# Patient Record
Sex: Female | Born: 1960 | Race: White | Hispanic: No | Marital: Married | Smoking: Former smoker
Health system: Southern US, Community
[De-identification: ages and names within clinical notes are randomized; demographics above are authoritative.]

## PROBLEM LIST (undated history)

## (undated) DIAGNOSIS — R1310 Dysphagia, unspecified: Secondary | ICD-10-CM

## (undated) DIAGNOSIS — K635 Polyp of colon: Secondary | ICD-10-CM

## (undated) DIAGNOSIS — D539 Nutritional anemia, unspecified: Secondary | ICD-10-CM

## (undated) DIAGNOSIS — B3781 Candidal esophagitis: Secondary | ICD-10-CM

## (undated) DIAGNOSIS — K644 Residual hemorrhoidal skin tags: Secondary | ICD-10-CM

## (undated) DIAGNOSIS — K7460 Unspecified cirrhosis of liver: Secondary | ICD-10-CM

## (undated) DIAGNOSIS — K760 Fatty (change of) liver, not elsewhere classified: Secondary | ICD-10-CM

## (undated) DIAGNOSIS — K7011 Alcoholic hepatitis with ascites: Secondary | ICD-10-CM

## (undated) DIAGNOSIS — I82409 Acute embolism and thrombosis of unspecified deep veins of unspecified lower extremity: Secondary | ICD-10-CM

## (undated) DIAGNOSIS — K209 Esophagitis, unspecified without bleeding: Secondary | ICD-10-CM

## (undated) DIAGNOSIS — K5790 Diverticulosis of intestine, part unspecified, without perforation or abscess without bleeding: Secondary | ICD-10-CM

## (undated) DIAGNOSIS — K3189 Other diseases of stomach and duodenum: Secondary | ICD-10-CM

## (undated) DIAGNOSIS — K766 Portal hypertension: Secondary | ICD-10-CM

## (undated) DIAGNOSIS — F1020 Alcohol dependence, uncomplicated: Secondary | ICD-10-CM

## (undated) DIAGNOSIS — F410 Panic disorder [episodic paroxysmal anxiety] without agoraphobia: Secondary | ICD-10-CM

## (undated) DIAGNOSIS — E46 Unspecified protein-calorie malnutrition: Secondary | ICD-10-CM

## (undated) DIAGNOSIS — I8500 Esophageal varices without bleeding: Secondary | ICD-10-CM

## (undated) DIAGNOSIS — K449 Diaphragmatic hernia without obstruction or gangrene: Secondary | ICD-10-CM

## (undated) HISTORY — DX: Portal hypertension: K76.6

## (undated) HISTORY — DX: Esophagitis, unspecified: K20.9

## (undated) HISTORY — DX: Fatty (change of) liver, not elsewhere classified: K76.0

## (undated) HISTORY — DX: Esophagitis, unspecified without bleeding: K20.90

## (undated) HISTORY — DX: Residual hemorrhoidal skin tags: K64.4

## (undated) HISTORY — DX: Polyp of colon: K63.5

## (undated) HISTORY — DX: Other diseases of stomach and duodenum: K31.89

## (undated) HISTORY — DX: Unspecified cirrhosis of liver: K74.60

## (undated) HISTORY — DX: Diaphragmatic hernia without obstruction or gangrene: K44.9

## (undated) HISTORY — DX: Acute embolism and thrombosis of unspecified deep veins of unspecified lower extremity: I82.409

## (undated) HISTORY — DX: Esophageal varices without bleeding: I85.00

## (undated) HISTORY — DX: Diverticulosis of intestine, part unspecified, without perforation or abscess without bleeding: K57.90

## (undated) HISTORY — DX: Candidal esophagitis: B37.81

---

## 2001-03-17 ENCOUNTER — Other Ambulatory Visit: Admission: RE | Admit: 2001-03-17 | Discharge: 2001-03-17 | Payer: Self-pay | Admitting: Obstetrics & Gynecology

## 2002-05-21 ENCOUNTER — Other Ambulatory Visit: Admission: RE | Admit: 2002-05-21 | Discharge: 2002-05-21 | Payer: Self-pay | Admitting: Obstetrics & Gynecology

## 2002-06-26 ENCOUNTER — Encounter: Payer: Self-pay | Admitting: Family Medicine

## 2002-06-26 ENCOUNTER — Ambulatory Visit (HOSPITAL_COMMUNITY): Admission: RE | Admit: 2002-06-26 | Discharge: 2002-06-26 | Payer: Self-pay | Admitting: Family Medicine

## 2003-08-13 ENCOUNTER — Other Ambulatory Visit: Admission: RE | Admit: 2003-08-13 | Discharge: 2003-08-13 | Payer: Self-pay | Admitting: Obstetrics & Gynecology

## 2004-09-29 ENCOUNTER — Other Ambulatory Visit: Admission: RE | Admit: 2004-09-29 | Discharge: 2004-09-29 | Payer: Self-pay | Admitting: Obstetrics & Gynecology

## 2009-08-23 DIAGNOSIS — F410 Panic disorder [episodic paroxysmal anxiety] without agoraphobia: Secondary | ICD-10-CM

## 2009-08-23 DIAGNOSIS — R1310 Dysphagia, unspecified: Secondary | ICD-10-CM

## 2009-08-23 HISTORY — DX: Panic disorder (episodic paroxysmal anxiety): F41.0

## 2009-08-23 HISTORY — DX: Dysphagia, unspecified: R13.10

## 2010-03-25 ENCOUNTER — Ambulatory Visit: Payer: Self-pay | Admitting: Family Medicine

## 2010-03-25 DIAGNOSIS — R1310 Dysphagia, unspecified: Secondary | ICD-10-CM | POA: Insufficient documentation

## 2010-03-25 DIAGNOSIS — F410 Panic disorder [episodic paroxysmal anxiety] without agoraphobia: Secondary | ICD-10-CM

## 2010-03-25 LAB — CONVERTED CEMR LAB
ALT: 14 units/L (ref 0–35)
AST: 25 units/L (ref 0–37)
Albumin: 4.5 g/dL (ref 3.5–5.2)
Alkaline Phosphatase: 32 units/L — ABNORMAL LOW (ref 39–117)
BUN: 11 mg/dL (ref 6–23)
Basophils Absolute: 0 10*3/uL (ref 0.0–0.1)
Basophils Relative: 0.4 % (ref 0.0–3.0)
Bilirubin, Direct: 0.1 mg/dL (ref 0.0–0.3)
CO2: 29 meq/L (ref 19–32)
Calcium: 9.9 mg/dL (ref 8.4–10.5)
Chloride: 100 meq/L (ref 96–112)
Cholesterol: 237 mg/dL — ABNORMAL HIGH (ref 0–200)
Creatinine, Ser: 0.7 mg/dL (ref 0.4–1.2)
Direct LDL: 146.3 mg/dL
Eosinophils Absolute: 0 10*3/uL (ref 0.0–0.7)
Eosinophils Relative: 0.2 % (ref 0.0–5.0)
Free T4: 0.64 ng/dL (ref 0.60–1.60)
GFR calc non Af Amer: 90.48 mL/min (ref >60)
Glucose, Bld: 93 mg/dL (ref 70–99)
HCT: 42 % (ref 36.0–46.0)
HDL: 69.7 mg/dL (ref >39.00)
Hemoglobin: 14.5 g/dL (ref 12.0–15.0)
Lymphocytes Relative: 19.4 % (ref 12.0–46.0)
Lymphs Abs: 1.5 10*3/uL (ref 0.7–4.0)
MCHC: 34.4 g/dL (ref 30.0–36.0)
MCV: 98.1 fL (ref 78.0–100.0)
Monocytes Absolute: 0.6 10*3/uL (ref 0.1–1.0)
Monocytes Relative: 7.1 % (ref 3.0–12.0)
Neutro Abs: 5.8 10*3/uL (ref 1.4–7.7)
Neutrophils Relative %: 72.9 % (ref 43.0–77.0)
Platelets: 316 10*3/uL (ref 150.0–400.0)
Potassium: 5.4 meq/L — ABNORMAL HIGH (ref 3.5–5.1)
RBC: 4.28 M/uL (ref 3.87–5.11)
RDW: 12.6 % (ref 11.5–14.6)
Sodium: 141 meq/L (ref 135–145)
T3, Free: 2.8 pg/mL (ref 2.3–4.2)
TSH: 0.47 microintl units/mL (ref 0.35–5.50)
Total Bilirubin: 0.6 mg/dL (ref 0.3–1.2)
Total CHOL/HDL Ratio: 3
Total Protein: 7.5 g/dL (ref 6.0–8.3)
Triglycerides: 99 mg/dL (ref 0.0–149.0)
VLDL: 19.8 mg/dL (ref 0.0–40.0)
WBC: 7.9 10*3/uL (ref 4.5–10.5)

## 2010-03-31 ENCOUNTER — Ambulatory Visit: Payer: Self-pay | Admitting: Family Medicine

## 2010-03-31 LAB — CONVERTED CEMR LAB
Bilirubin Urine: NEGATIVE
Glucose, Urine, Semiquant: NEGATIVE
Nitrite: NEGATIVE
Protein, U semiquant: NEGATIVE
Specific Gravity, Urine: 1.01
Urobilinogen, UA: 0.2
WBC Urine, dipstick: NEGATIVE
pH: 5

## 2010-04-13 ENCOUNTER — Ambulatory Visit: Payer: Self-pay | Admitting: Family Medicine

## 2010-04-13 DIAGNOSIS — N3000 Acute cystitis without hematuria: Secondary | ICD-10-CM | POA: Insufficient documentation

## 2010-04-13 LAB — CONVERTED CEMR LAB
Bilirubin Urine: NEGATIVE
Glucose, Urine, Semiquant: NEGATIVE
Ketones, urine, test strip: NEGATIVE
Nitrite: NEGATIVE
Protein, U semiquant: >=300
Specific Gravity, Urine: 1.025
Urobilinogen, UA: 0.2
WBC Urine, dipstick: NEGATIVE
pH: 6.5

## 2010-05-25 ENCOUNTER — Encounter (INDEPENDENT_AMBULATORY_CARE_PROVIDER_SITE_OTHER): Payer: Self-pay | Admitting: *Deleted

## 2010-09-13 ENCOUNTER — Encounter: Payer: Self-pay | Admitting: Obstetrics & Gynecology

## 2010-09-24 NOTE — Assessment & Plan Note (Signed)
Summary: NOT FEELING WELL / PALPITATIONS... OK PER DR Leslee Suire // RS   Vital Signs:  Patient profile:   50 year old female Menstrual status:  regular LMP:     03/08/2010 Height:      65.5 inches Weight:      134 pounds BMI:     22.04 Temp:     98.1 degrees F oral BP sitting:   140 / 80  (left arm) Cuff size:   regular  Vitals Entered By: Kern Reap CMA Duncan Dull) (March 25, 2010 11:31 AM) CC: new to establish, papitations Is Patient Diabetic? No Pain Assessment Patient in pain? no      LMP (date): 03/08/2010     Menstrual Status regular Enter LMP: 03/08/2010   CC:  new to establish and papitations.  History of Present Illness: Mercedes Mitchell is a 50 year old, married female, nonsmoker, who comes in today as a new patient for evaluation of two problems.  She said a history of panic attacks in the past.  It started about 15 years ago.  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  Next large trucks.  Therefore, when she goes to the beach.  She only takes the back roads to avoid any tracts.  Most recently, her anxiety spells have been unrelated to driving.  She has episodes where she gets a sweaty short of breath, rapid heart rate in these episodes can last up to 30 minutes and go away.  Review of systems otherwise negative.  Last Pap two years ago, Dr. Arlyce Dice.  Another issue is dysphasia.  She states her husband had to performed the Heimlich maneuver on her while back because she got something stuck in her throat.  Since that time.  She occasionally has difficulty swallowing.  No history of reflux.  Preventive Screening-Counseling & Management  Alcohol-Tobacco     Smoking Status: quit     Year Quit: 2009  Caffeine-Diet-Exercise     Does Patient Exercise: yes  Hep-HIV-STD-Contraception     Dental Visit-last 6 months no      Drug Use:  no.    Allergies (verified): No Known Drug Allergies  Past History:  Past medical, surgical, family and social histories (including risk  factors) reviewed, and no changes noted (except as noted below).  Past Surgical History: Tonsillectomy  Family History: Reviewed history and no changes required. Father: deceased - MI Mother: deceased Siblings: 1 brother deceased               3 sister/ 1 sister deceased - kidney dx  Social History: Reviewed history and no changes required. Occupation:[employer] Married Alcohol use-yes Drug use-no Regular exercise-yes Smoking Status:  quit Drug Use:  no Does Patient Exercise:  yes Dental Care w/in 6 mos.:  no  Review of Systems      See HPI  Physical Exam  General:  Well-developed,well-nourished,in no acute distress; alert,appropriate and cooperative throughout examination Head:  Normocephalic and atraumatic without obvious abnormalities. No apparent alopecia or balding. Neck:  No deformities, masses, or tenderness noted. Lungs:  Normal respiratory effort, chest expands symmetrically. Lungs are clear to auscultation, no crackles or wheezes. Heart:  Normal rate and regular rhythm. S1 and S2 normal without gallop, murmur, click, rub or other extra sounds.   Problems:  Medical Problems Added: 1)  Dx of Dysphagia  (ICD-787.20) 2)  Dx of Panic Disorder,no Agoraphobia  (ICD-300.01)  Impression & Recommendations:  Problem # 1:  DYSPHAGIA (ICD-787.20) Assessment New  Orders: Venipuncture (  33295) Specimen Handling (99000) EKG w/ Interpretation (93000) Gastroenterology Referral (GI) TLB-Lipid Panel (80061-LIPID) TLB-BMP (Basic Metabolic Panel-BMET) (80048-METABOL) TLB-CBC Platelet - w/Differential (85025-CBCD) TLB-Hepatic/Liver Function Pnl (80076-HEPATIC) TLB-TSH (Thyroid Stimulating Hormone) (84443-TSH) TLB-T4 (Thyrox), Free [phone]) TLB-T3, Free (Triiodothyronine) (84481-T3FREE)  Problem # 2:  PANIC DISORDER,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 1 tab @ bedtime    Ativan 0.5 Mg Tabs (Lorazepam) .Marland Kitchen... 1 by mouth as needed  Orders: Venipuncture (30160) Specimen Handling (10932) EKG w/ Interpretation (93000) TLB-Lipid Panel (80061-LIPID) TLB-BMP (Basic Metabolic Panel-BMET) (80048-METABOL) TLB-CBC Platelet - w/Differential (85025-CBCD) TLB-Hepatic/Liver Function Pnl (80076-HEPATIC) TLB-TSH (Thyroid Stimulating Hormone) (84443-TSH) TLB-T4 (Thyrox), Free [phone]) TLB-T3, Free (Triiodothyronine) (84481-T3FREE)  Complete Medication List: 1)  Celexa 20 Mg Tabs (Citalopram hydrobromide) .Marland Kitchen.. 1 tab @ bedtime 2)  Ativan 0.5 Mg Tabs (Lorazepam) .Marland Kitchen.. 1 by mouth as needed  Patient Instructions: 1)  begin Celexa 20 mg a day at bedtime.  Also, Ativan .5 if you have a breakthrough panic attack. 2)  Return in one week for 30 minute appointment for general physical exam. 3)  We will review all your laboratory week unless there is something unusual.  I will call you immediately 4)  I will request a consult from GI to evaluate the dysphagia Prescriptions: ATIVAN 0.5 MG TABS (LORAZEPAM) 1 by mouth as needed  #30 x 1   Entered and Authorized by:   Roderick Pee MD   Signed by:   Roderick Pee MD on 03/25/2010   Method used:   Print then Give to Patient   RxID:   [id number] CELEXA 20 MG TABS (CITALOPRAM HYDROBROMIDE) 1 tab @ bedtime  #100 x 3   Entered and Authorized by:   Roderick Pee MD   Signed by:   Roderick Pee MD on 03/25/2010   Method used:   Print then Give to Patient   RxID:   [id number]

## 2010-09-24 NOTE — Assessment & Plan Note (Signed)
Summary: 30 minute appt per dr Shawanna Zanders//ccm   Vital Signs:  Patient profile:   50 year old female Menstrual status:  regular Height:      65.5 inches Weight:      134 pounds Temp:     98.1 degrees F oral BP sitting:   140 / 88  (left arm) Cuff size:   regular  Vitals Entered By: Kern Reap CMA Duncan Dull) (March 31, 2010 4:33 PM) CC: cpx   CC:  cpx.  History of Present Illness: dao is a 50 year old, married female, G2, P2, with a 54 and 2 year old sons         who comes in today for general physical examination and to follow-up panic attacks.  She's always been in excellent, health.  She's had no chronic health problems besides the panic attacks.  We start on Celexa 20 mg nightly 5 days ago, and she states she only feels much better.  She's not had to use the Ativan.  She also wonders if her dysphagia might be related to anxiety.  However, I encouraged her to keep the GI consult appointment.  Tetanus booster 2006.  Annual Pap by Dr. Arlyce Dice.  GYN  Allergies: No Known Drug Allergies PMH-FH-SH reviewed-no changes except otherwise noted  Review of Systems      See HPI  Physical Exam  General:  Well-developed,well-nourished,in no acute distress; alert,appropriate and cooperative throughout examination Head:  Normocephalic and atraumatic without obvious abnormalities. No apparent alopecia or balding. Eyes:  No corneal or conjunctival inflammation noted. EOMI. Perrla. Funduscopic exam benign, without hemorrhages, exudates or papilledema. Vision grossly normal. Ears:  External ear exam shows no significant lesions or deformities.  Otoscopic examination reveals clear canals, tympanic membranes are intact bilaterally without bulging, retraction, inflammation or discharge. Hearing is grossly normal bilaterally. Nose:  External nasal examination shows no deformity or inflammation. Nasal mucosa are pink and moist without lesions or exudates. Mouth:  Oral mucosa and oropharynx without lesions  or exudates.  Teeth in good repair. Neck:  No deformities, masses, or tenderness noted. Chest Wall:  No deformities, masses, or tenderness noted. Breasts:  No mass, nodules, thickening, tenderness, bulging, retraction, inflamation, nipple discharge or skin changes noted.   Lungs:  Normal respiratory effort, chest expands symmetrically. Lungs are clear to auscultation, no crackles or wheezes. Heart:  Normal rate and regular rhythm. S1 and S2 normal without gallop, murmur, click, rub or other extra sounds. Abdomen:  Bowel sounds positive,abdomen soft and non-tender without masses, organomegaly or hernias noted. Msk:  No deformity or scoliosis noted of thoracic or lumbar spine.   Pulses:  R and L carotid,radial,femoral,dorsalis pedis and posterior tibial pulses are full and equal bilaterally Extremities:  No clubbing, cyanosis, edema, or deformity noted with normal full range of motion of all joints.   Neurologic:  No cranial nerve deficits noted. Station and gait are normal. Plantar reflexes are down-going bilaterally. DTRs are symmetrical throughout. Sensory, motor and coordinative functions appear intact. Skin:  total body skin exam normal.  She does have a light eyes, therefore encouraged to wear sunscreens and watch her moles, carefully, and return yearly for annual physical examination and skin check Cervical Nodes:  No lymphadenopathy noted Axillary Nodes:  No palpable lymphadenopathy Inguinal Nodes:  No significant adenopathy Psych:  Cognition and judgment appear intact. Alert and cooperative with normal attention span and concentration. 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 1 tab @ bedtime    Ativan  0.5 Mg Tabs (Lorazepam) .Marland Kitchen... 1 by mouth as needed  Problem # 3:  Preventive Health Care (ICD-V70.0) Assessment: New  Complete Medication List: 1)  Celexa 20 Mg Tabs (Citalopram hydrobromide) .Marland Kitchen.. 1 tab @ bedtime 2)  Ativan 0.5 Mg Tabs (Lorazepam) .Marland Kitchen.. 1 by mouth as needed  Patient Instructions: 1)  continue current medications.  Return in 3 weeks for follow-up, sooner if any problems. 2)  Wear SPF 30+ screens and watch her freckles and moles.  Carefully. 3)  Please schedule a follow-up appointment in 1 year. 4)  Schedule your mammogram.   Immunization History:  Tetanus/Td Immunization History:    Tetanus/Td:  historical (08/23/2004)   Laboratory Results   Urine Tests  Date/Time Received: March 31, 2010   Routine Urinalysis   Color: yellow Appearance: Clear Glucose: negative   (Normal Range: Negative) Bilirubin: negative   (Normal Range: Negative) Ketone: trace (5)   (Normal Range: Negative) Spec. Gravity: 1.010   (Normal Range: 1.003-1.035) Blood: moderate   (Normal Range: Negative) pH: 5.0   (Normal Range: 5.0-8.0) Protein: negative   (Normal Range: Negative) Urobilinogen: 0.2   (Normal Range: 0-1) Nitrite: negative   (Normal Range: Negative) Leukocyte Esterace: negative   (Normal Range: Negative)    Comments: Kern Reap CMA Duncan Dull)  March 31, 2010 5:31 PM

## 2010-09-24 NOTE — Assessment & Plan Note (Signed)
Summary: uti/cjr   Vital Signs:  Patient profile:   50 year old female Menstrual status:  regular Weight:      134 pounds Temp:     97.9 degrees F oral BP sitting:   130 / 90  (left arm) Cuff size:   regular  Vitals Entered By: Kern Reap CMA Duncan Dull) (April 13, 2010 12:34 PM) CC: uti   CC:  uti.  History of Present Illness: Mercedes Mitchell is a 50 year old female, who comes in with a week's history of frequency and dysuria and now one days history of hematuria.  Last week she noticed frequency and dysuria.  She drank lots of water, including cranberry juice, and his symptoms seem to get better until yesterday when they got worse and she noticed blood in her urine.  No back pain.  No fever, chills  Allergies: No Known Drug Allergies  Past History:  Past medical, surgical, family and social histories (including risk factors) reviewed for relevance to current acute and chronic problems.  Past Surgical History: Reviewed history from 03/25/2010 and no changes required. Tonsillectomy  Family History: Reviewed history from 03/25/2010 and no changes required. Father: deceased - MI Mother: deceased Siblings: 1 brother deceased               3 sister/ 1 sister deceased - kidney dx  Social History: Reviewed history from 03/25/2010 and no changes required. Occupation:[employer] Married Alcohol use-yes Drug use-no Regular exercise-yes  Physical Exam  General:  Well-developed,well-nourished,in no acute distress; alert,appropriate and cooperative throughout examination Abdomen:  Bowel sounds positive,abdomen soft and non-tender without masses, organomegaly or hernias noted.   Impression & Recommendations:  Problem # 1:  ACUTE CYSTITIS (ICD-595.0) Assessment New  Orders: UA Dipstick w/o Micro (manual) (24401) Prescription Created Electronically [phone])  Her updated medication list for this problem includes:    Septra Ds 800-160 Mg Tabs (Sulfamethoxazole-trimethoprim)  .Marland Kitchen... Take 1 tablet by mouth two times a day  Complete Medication List: 1)  Celexa 20 Mg Tabs (Citalopram hydrobromide) .Marland Kitchen.. 1 tab @ bedtime 2)  Ativan 0.5 Mg Tabs (Lorazepam) .Marland Kitchen.. 1 by mouth as needed 3)  Septra Ds 800-160 Mg Tabs (Sulfamethoxazole-trimethoprim) .... Take 1 tablet by mouth two times a day  Patient Instructions: 1)  drink 4 ounces of cranberry juice twice daily, and 30 ounces of water daily.  Begin Septra DS, one twice daily to bottle empty Prescriptions: SEPTRA DS 800-160 MG TABS (SULFAMETHOXAZOLE-TRIMETHOPRIM) Take 1 tablet by mouth two times a day  #20 x 1   Entered and Authorized by:   Roderick Pee MD   Signed by:   Roderick Pee MD on 04/13/2010   Method used:   Electronically to        [facility] [address] (retail)       [address]       [place], [place]  [address]       Ph: [phone] or [phone]       Fax: [phone]   RxID:   [id number]   Laboratory Results   Urine Tests  Date/Time Received: April 13, 2010   Routine Urinalysis   Color: brown Appearance: turbid Glucose: negative   (Normal Range: Negative) Bilirubin: negative   (Normal Range: Negative) Ketone: negative   (Normal Range: Negative) Spec. Gravity: 1.025   (Normal Range: 1.003-1.035) Blood: large   (Normal Range: Negative) pH: 6.5   (Normal Range: 5.0-8.0) Protein: >=300   (  Normal Range: Negative) Urobilinogen: 0.2   (Normal Range: 0-1) Nitrite: negative   (Normal Range: Negative) Leukocyte Esterace: negative   (Normal Range: Negative)    Comments: Kern Reap CMA Duncan Dull)  April 13, 2010 12:42 PM

## 2010-09-24 NOTE — Letter (Signed)
Summary: [facility] Referral (unable to schedule) Notification  [facility]  [address] [place], [place] [address]   Phone: [phone]  Fax: [phone]      May 25, 2010 Mercedes Mitchell 09-09-1960 MRN: [id number]   [name] [address] [place], [place]  [address]   Dear Dr.Todd:   Thank you for your kind referral of the above patient. We have attempted to schedule the recommended Colonoscopy but have been unable to schedule because:  _x_ The patient was not available by phone and/or has not returned our calls.  __ The patient declined to schedule the procedure at this time.  We appreciate the referral and hope that we will have the opportunity to treat this patient in the future.    Sincerely,   [facility]  Vania Rea. Jarold Motto M.D. Hedwig Morton. Juanda Chance M.D. Venita Lick. Russella Dar M.D. Wilhemina Bonito. Marina Goodell M.D. Barbette Hair. Arlyce Dice M.D. Iva Boop M.D. Cheron Every.D.

## 2016-12-21 DIAGNOSIS — K7011 Alcoholic hepatitis with ascites: Secondary | ICD-10-CM

## 2016-12-21 DIAGNOSIS — E46 Unspecified protein-calorie malnutrition: Secondary | ICD-10-CM

## 2016-12-21 DIAGNOSIS — D539 Nutritional anemia, unspecified: Secondary | ICD-10-CM | POA: Insufficient documentation

## 2016-12-21 DIAGNOSIS — F1020 Alcohol dependence, uncomplicated: Secondary | ICD-10-CM

## 2016-12-21 HISTORY — DX: Nutritional anemia, unspecified: D53.9

## 2016-12-21 HISTORY — DX: Alcohol dependence, uncomplicated: F10.20

## 2016-12-21 HISTORY — DX: Unspecified protein-calorie malnutrition: E46

## 2016-12-21 HISTORY — DX: Alcoholic hepatitis with ascites: K70.11

## 2016-12-23 ENCOUNTER — Ambulatory Visit (INDEPENDENT_AMBULATORY_CARE_PROVIDER_SITE_OTHER): Payer: Managed Care, Other (non HMO) | Admitting: Family Medicine

## 2016-12-23 ENCOUNTER — Encounter (HOSPITAL_COMMUNITY): Payer: Self-pay | Admitting: Emergency Medicine

## 2016-12-23 ENCOUNTER — Emergency Department (HOSPITAL_COMMUNITY): Payer: Managed Care, Other (non HMO)

## 2016-12-23 ENCOUNTER — Inpatient Hospital Stay (HOSPITAL_COMMUNITY)
Admission: EM | Admit: 2016-12-23 | Discharge: 2016-12-28 | DRG: 432 | Disposition: A | Discharge status: Home / Self Care | Payer: Managed Care, Other (non HMO) | Attending: Internal Medicine | Admitting: Internal Medicine

## 2016-12-23 ENCOUNTER — Encounter: Payer: Self-pay | Admitting: Family Medicine

## 2016-12-23 ENCOUNTER — Inpatient Hospital Stay: Admission: AD | Admit: 2016-12-23 | Payer: Self-pay | Source: Ambulatory Visit

## 2016-12-23 ENCOUNTER — Emergency Department (HOSPITAL_BASED_OUTPATIENT_CLINIC_OR_DEPARTMENT_OTHER)
Admit: 2016-12-23 | Discharge: 2016-12-23 | Disposition: A | Discharge status: Home / Self Care | Payer: Managed Care, Other (non HMO) | Attending: Emergency Medicine | Admitting: Emergency Medicine

## 2016-12-23 VITALS — BP 100/70 | HR 110 | Resp 12 | Ht 66.0 in | Wt 113.0 lb

## 2016-12-23 DIAGNOSIS — I959 Hypotension, unspecified: Secondary | ICD-10-CM | POA: Diagnosis present

## 2016-12-23 DIAGNOSIS — F410 Panic disorder [episodic paroxysmal anxiety] without agoraphobia: Secondary | ICD-10-CM | POA: Diagnosis present

## 2016-12-23 DIAGNOSIS — K7031 Alcoholic cirrhosis of liver with ascites: Secondary | ICD-10-CM | POA: Diagnosis not present

## 2016-12-23 DIAGNOSIS — Y92230 Patient room in hospital as the place of occurrence of the external cause: Secondary | ICD-10-CM | POA: Diagnosis not present

## 2016-12-23 DIAGNOSIS — K209 Esophagitis, unspecified without bleeding: Secondary | ICD-10-CM

## 2016-12-23 DIAGNOSIS — M79609 Pain in unspecified limb: Secondary | ICD-10-CM

## 2016-12-23 DIAGNOSIS — D539 Nutritional anemia, unspecified: Secondary | ICD-10-CM | POA: Diagnosis present

## 2016-12-23 DIAGNOSIS — R34 Anuria and oliguria: Secondary | ICD-10-CM | POA: Diagnosis present

## 2016-12-23 DIAGNOSIS — E871 Hypo-osmolality and hyponatremia: Secondary | ICD-10-CM | POA: Diagnosis present

## 2016-12-23 DIAGNOSIS — F1020 Alcohol dependence, uncomplicated: Secondary | ICD-10-CM | POA: Diagnosis present

## 2016-12-23 DIAGNOSIS — R159 Full incontinence of feces: Secondary | ICD-10-CM | POA: Diagnosis not present

## 2016-12-23 DIAGNOSIS — N179 Acute kidney failure, unspecified: Secondary | ICD-10-CM | POA: Diagnosis present

## 2016-12-23 DIAGNOSIS — M7989 Other specified soft tissue disorders: Secondary | ICD-10-CM

## 2016-12-23 DIAGNOSIS — T380X5A Adverse effect of glucocorticoids and synthetic analogues, initial encounter: Secondary | ICD-10-CM | POA: Diagnosis not present

## 2016-12-23 DIAGNOSIS — B179 Acute viral hepatitis, unspecified: Secondary | ICD-10-CM

## 2016-12-23 DIAGNOSIS — K760 Fatty (change of) liver, not elsewhere classified: Secondary | ICD-10-CM | POA: Diagnosis present

## 2016-12-23 DIAGNOSIS — K3189 Other diseases of stomach and duodenum: Secondary | ICD-10-CM | POA: Diagnosis present

## 2016-12-23 DIAGNOSIS — F1010 Alcohol abuse, uncomplicated: Secondary | ICD-10-CM | POA: Diagnosis not present

## 2016-12-23 DIAGNOSIS — F17210 Nicotine dependence, cigarettes, uncomplicated: Secondary | ICD-10-CM | POA: Diagnosis present

## 2016-12-23 DIAGNOSIS — I4581 Long QT syndrome: Secondary | ICD-10-CM | POA: Diagnosis present

## 2016-12-23 DIAGNOSIS — R188 Other ascites: Secondary | ICD-10-CM

## 2016-12-23 DIAGNOSIS — R195 Other fecal abnormalities: Secondary | ICD-10-CM | POA: Diagnosis present

## 2016-12-23 DIAGNOSIS — R64 Cachexia: Secondary | ICD-10-CM | POA: Diagnosis present

## 2016-12-23 DIAGNOSIS — F419 Anxiety disorder, unspecified: Secondary | ICD-10-CM | POA: Diagnosis present

## 2016-12-23 DIAGNOSIS — K7010 Alcoholic hepatitis without ascites: Secondary | ICD-10-CM | POA: Diagnosis present

## 2016-12-23 DIAGNOSIS — K8020 Calculus of gallbladder without cholecystitis without obstruction: Secondary | ICD-10-CM | POA: Diagnosis present

## 2016-12-23 DIAGNOSIS — E876 Hypokalemia: Secondary | ICD-10-CM | POA: Diagnosis present

## 2016-12-23 DIAGNOSIS — K7460 Unspecified cirrhosis of liver: Secondary | ICD-10-CM | POA: Diagnosis present

## 2016-12-23 DIAGNOSIS — Z681 Body mass index (BMI) 19 or less, adult: Secondary | ICD-10-CM

## 2016-12-23 DIAGNOSIS — K920 Hematemesis: Secondary | ICD-10-CM | POA: Diagnosis not present

## 2016-12-23 DIAGNOSIS — K449 Diaphragmatic hernia without obstruction or gangrene: Secondary | ICD-10-CM | POA: Diagnosis present

## 2016-12-23 DIAGNOSIS — R17 Unspecified jaundice: Secondary | ICD-10-CM | POA: Diagnosis not present

## 2016-12-23 DIAGNOSIS — D72829 Elevated white blood cell count, unspecified: Secondary | ICD-10-CM | POA: Diagnosis not present

## 2016-12-23 DIAGNOSIS — IMO0001 Reserved for inherently not codable concepts without codable children: Secondary | ICD-10-CM

## 2016-12-23 DIAGNOSIS — K2210 Ulcer of esophagus without bleeding: Secondary | ICD-10-CM | POA: Diagnosis present

## 2016-12-23 DIAGNOSIS — K7011 Alcoholic hepatitis with ascites: Secondary | ICD-10-CM | POA: Diagnosis present

## 2016-12-23 DIAGNOSIS — E43 Unspecified severe protein-calorie malnutrition: Secondary | ICD-10-CM | POA: Diagnosis not present

## 2016-12-23 DIAGNOSIS — E875 Hyperkalemia: Secondary | ICD-10-CM

## 2016-12-23 DIAGNOSIS — K766 Portal hypertension: Secondary | ICD-10-CM | POA: Diagnosis present

## 2016-12-23 DIAGNOSIS — B3781 Candidal esophagitis: Secondary | ICD-10-CM | POA: Diagnosis present

## 2016-12-23 DIAGNOSIS — K210 Gastro-esophageal reflux disease with esophagitis: Secondary | ICD-10-CM | POA: Diagnosis present

## 2016-12-23 DIAGNOSIS — D689 Coagulation defect, unspecified: Secondary | ICD-10-CM | POA: Diagnosis present

## 2016-12-23 DIAGNOSIS — Z8249 Family history of ischemic heart disease and other diseases of the circulatory system: Secondary | ICD-10-CM

## 2016-12-23 DIAGNOSIS — R000 Tachycardia, unspecified: Secondary | ICD-10-CM | POA: Diagnosis present

## 2016-12-23 HISTORY — DX: Alcohol dependence, uncomplicated: F10.20

## 2016-12-23 HISTORY — DX: Dysphagia, unspecified: R13.10

## 2016-12-23 HISTORY — DX: Nutritional anemia, unspecified: D53.9

## 2016-12-23 HISTORY — DX: Alcoholic hepatitis with ascites: K70.11

## 2016-12-23 HISTORY — DX: Unspecified protein-calorie malnutrition: E46

## 2016-12-23 HISTORY — DX: Panic disorder (episodic paroxysmal anxiety): F41.0

## 2016-12-23 LAB — COMPREHENSIVE METABOLIC PANEL
ALT: 46 U/L (ref 14–54)
ANION GAP: 16 — AB (ref 5–15)
AST: 137 U/L — ABNORMAL HIGH (ref 15–41)
Albumin: 2.3 g/dL — ABNORMAL LOW (ref 3.5–5.0)
Alkaline Phosphatase: 304 U/L — ABNORMAL HIGH (ref 38–126)
BUN: 8 mg/dL (ref 6–20)
CHLORIDE: 79 mmol/L — AB (ref 101–111)
CO2: 24 mmol/L (ref 22–32)
CREATININE: 1.3 mg/dL — AB (ref 0.44–1.00)
Calcium: 7.8 mg/dL — ABNORMAL LOW (ref 8.9–10.3)
GFR, EST AFRICAN AMERICAN: 53 mL/min — AB (ref >60)
GFR, EST NON AFRICAN AMERICAN: 46 mL/min — AB (ref >60)
Glucose, Bld: 126 mg/dL — ABNORMAL HIGH (ref 65–99)
Potassium: 2.9 mmol/L — ABNORMAL LOW (ref 3.5–5.1)
SODIUM: 119 mmol/L — AB (ref 135–145)
Total Bilirubin: 21.6 mg/dL (ref 0.3–1.2)
Total Protein: 6.1 g/dL — ABNORMAL LOW (ref 6.5–8.1)

## 2016-12-23 LAB — CBC
HCT: 31.4 % — ABNORMAL LOW (ref 36.0–46.0)
HEMOGLOBIN: 11.1 g/dL — AB (ref 12.0–15.0)
MCH: 38.3 pg — AB (ref 26.0–34.0)
MCHC: 35.4 g/dL (ref 30.0–36.0)
MCV: 108.3 fL — AB (ref 78.0–100.0)
PLATELETS: 327 10*3/uL (ref 150–400)
RBC: 2.9 MIL/uL — AB (ref 3.87–5.11)
RDW: 15.2 % (ref 11.5–15.5)
WBC: 26.8 10*3/uL — AB (ref 4.0–10.5)

## 2016-12-23 LAB — URINALYSIS, ROUTINE W REFLEX MICROSCOPIC
Glucose, UA: NEGATIVE mg/dL
Ketones, ur: NEGATIVE mg/dL
LEUKOCYTES UA: NEGATIVE
Nitrite: NEGATIVE
Protein, ur: NEGATIVE mg/dL
SPECIFIC GRAVITY, URINE: 1.009 (ref 1.005–1.030)
pH: 6 (ref 5.0–8.0)

## 2016-12-23 LAB — I-STAT CG4 LACTIC ACID, ED: LACTIC ACID, VENOUS: 2.01 mmol/L — AB (ref 0.5–1.9)

## 2016-12-23 LAB — PROTIME-INR
INR: 1.35
PROTHROMBIN TIME: 16.8 s — AB (ref 11.4–15.2)

## 2016-12-23 LAB — MAGNESIUM: MAGNESIUM: 1.5 mg/dL — AB (ref 1.7–2.4)

## 2016-12-23 LAB — ACETAMINOPHEN LEVEL

## 2016-12-23 LAB — BILIRUBIN, DIRECT: BILIRUBIN DIRECT: 14.4 mg/dL — AB (ref 0.1–0.5)

## 2016-12-23 LAB — LIPASE, BLOOD: LIPASE: 23 U/L (ref 11–51)

## 2016-12-23 MED ORDER — ENOXAPARIN SODIUM 40 MG/0.4ML ~~LOC~~ SOLN
40.0000 mg | Freq: Every day | SUBCUTANEOUS | Status: DC
  Administered 2016-12-23 – 2016-12-27 (×5): 40 mg via SUBCUTANEOUS
  Filled 2016-12-23 (×5): qty 0.4

## 2016-12-23 MED ORDER — PANTOPRAZOLE SODIUM 40 MG PO TBEC
40.0000 mg | DELAYED_RELEASE_TABLET | Freq: Every day | ORAL | Status: DC
  Administered 2016-12-24: 40 mg via ORAL
  Filled 2016-12-23: qty 1

## 2016-12-23 MED ORDER — ADULT MULTIVITAMIN W/MINERALS CH
1.0000 | ORAL_TABLET | Freq: Every day | ORAL | Status: DC
  Administered 2016-12-26 – 2016-12-28 (×3): 1 via ORAL
  Filled 2016-12-23 (×5): qty 1

## 2016-12-23 MED ORDER — VITAMIN B-1 100 MG PO TABS
100.0000 mg | ORAL_TABLET | Freq: Every day | ORAL | Status: DC
  Administered 2016-12-24 – 2016-12-28 (×3): 100 mg via ORAL
  Filled 2016-12-23 (×4): qty 1

## 2016-12-23 MED ORDER — ONDANSETRON HCL 4 MG/2ML IJ SOLN
4.0000 mg | Freq: Four times a day (QID) | INTRAMUSCULAR | Status: DC | PRN
  Administered 2016-12-24 – 2016-12-25 (×3): 4 mg via INTRAVENOUS
  Filled 2016-12-23 (×2): qty 2

## 2016-12-23 MED ORDER — SODIUM CHLORIDE 0.9 % IV BOLUS (SEPSIS)
1000.0000 mL | Freq: Once | INTRAVENOUS | Status: AC
  Administered 2016-12-23: 1000 mL via INTRAVENOUS

## 2016-12-23 MED ORDER — LORAZEPAM 2 MG/ML IJ SOLN: 1.0000 mg | Freq: Four times a day (QID) | INTRAMUSCULAR | Status: AC | PRN

## 2016-12-23 MED ORDER — THIAMINE HCL 100 MG/ML IJ SOLN
100.0000 mg | Freq: Every day | INTRAMUSCULAR | Status: DC
  Administered 2016-12-25 – 2016-12-27 (×2): 100 mg via INTRAVENOUS
  Filled 2016-12-23 (×3): qty 2

## 2016-12-23 MED ORDER — OXYCODONE HCL 5 MG PO TABS
5.0000 mg | ORAL_TABLET | Freq: Once | ORAL | Status: AC
  Administered 2016-12-23: 5 mg via ORAL
  Filled 2016-12-23: qty 1

## 2016-12-23 MED ORDER — SODIUM CHLORIDE 0.9% FLUSH
3.0000 mL | Freq: Two times a day (BID) | INTRAVENOUS | Status: DC
  Administered 2016-12-23 – 2016-12-27 (×6): 3 mL via INTRAVENOUS

## 2016-12-23 MED ORDER — LORAZEPAM 1 MG PO TABS: 1.0000 mg | ORAL_TABLET | Freq: Four times a day (QID) | ORAL | Status: AC | PRN

## 2016-12-23 MED ORDER — POTASSIUM CHLORIDE 10 MEQ/100ML IV SOLN
10.0000 meq | INTRAVENOUS | Status: AC
  Administered 2016-12-23 (×3): 10 meq via INTRAVENOUS
  Filled 2016-12-23 (×3): qty 100

## 2016-12-23 MED ORDER — POTASSIUM CHLORIDE CRYS ER 20 MEQ PO TBCR
40.0000 meq | EXTENDED_RELEASE_TABLET | Freq: Once | ORAL | Status: AC
Start: 2016-12-23 — End: 2016-12-23
  Administered 2016-12-23: 40 meq via ORAL
  Filled 2016-12-23: qty 2

## 2016-12-23 MED ORDER — IOPAMIDOL (ISOVUE-300) INJECTION 61%
INTRAVENOUS | Status: AC
  Filled 2016-12-23: qty 75

## 2016-12-23 MED ORDER — IOPAMIDOL (ISOVUE-300) INJECTION 61%
INTRAVENOUS | Status: AC
  Filled 2016-12-23: qty 100

## 2016-12-23 MED ORDER — FOLIC ACID 1 MG PO TABS
1.0000 mg | ORAL_TABLET | Freq: Every day | ORAL | Status: DC
  Administered 2016-12-24 – 2016-12-28 (×5): 1 mg via ORAL
  Filled 2016-12-23 (×5): qty 1

## 2016-12-23 MED ORDER — SODIUM CHLORIDE 0.9 % IV SOLN
INTRAVENOUS | Status: DC
  Administered 2016-12-23 – 2016-12-24 (×2): via INTRAVENOUS

## 2016-12-23 MED ORDER — BOOST / RESOURCE BREEZE PO LIQD
1.0000 | Freq: Three times a day (TID) | ORAL | Status: DC
  Administered 2016-12-24: 237 mL via ORAL
  Administered 2016-12-24 – 2016-12-28 (×6): 1 via ORAL

## 2016-12-23 MED ORDER — IOPAMIDOL (ISOVUE-300) INJECTION 61%
INTRAVENOUS | Status: AC
  Filled 2016-12-23: qty 30

## 2016-12-23 MED ORDER — MAGNESIUM SULFATE 2 GM/50ML IV SOLN
2.0000 g | Freq: Once | INTRAVENOUS | Status: AC
  Administered 2016-12-23: 2 g via INTRAVENOUS
  Filled 2016-12-23: qty 50

## 2016-12-23 MED ORDER — ONDANSETRON HCL 4 MG/2ML IJ SOLN
4.0000 mg | Freq: Once | INTRAMUSCULAR | Status: AC
  Administered 2016-12-23: 4 mg via INTRAVENOUS
  Filled 2016-12-23: qty 2

## 2016-12-23 MED ORDER — DEXTROSE 5 % IV SOLN
2.0000 g | Freq: Every day | INTRAVENOUS | Status: DC
Start: 2016-12-23 — End: 2016-12-26
  Administered 2016-12-23 – 2016-12-25 (×3): 2 g via INTRAVENOUS
  Filled 2016-12-23 (×3): qty 2

## 2016-12-23 MED ORDER — IOPAMIDOL (ISOVUE-300) INJECTION 61%
80.0000 mL | Freq: Once | INTRAVENOUS | Status: AC | PRN
  Administered 2016-12-23: 80 mL via INTRAVENOUS

## 2016-12-23 NOTE — ED Triage Notes (Signed)
Patient sent by doctor. Patient is complaining of abdominal pain and abdominal distension x2 weeks. Last BM was this morning. Patient has had decreased urinary output. Patient also states she has an alcohol problem. Patient last had an alcoholic drink last night.

## 2016-12-23 NOTE — H&P (Addendum)
History and Physical    Mercedes Mitchell IOM:[id number] DOB: 07/06/1961 DOA: 12/23/2016  PCP: Joycelyn Man, MD  Patient coming from: Home  I have personally briefly reviewed patient's old medical records in [place]  Chief Complaint: Abd pain  HPI: Mercedes Mitchell is a 56 y.o. female with medical history significant of ongoing EtOH abuse.  After discussion with patient and husband, I suspect patient is somewhat under-estimating her EtOH intake which seems to be in the realm of 2.5 gallons per week per husband.  Both agree that they are concerned that it might be too much.  Patient presents to the ED with c/o 1.5 months of Jaundice and now 2-3 weeks of Abd pain and distention with intermittent vomiting during this time.  12 lb weight loss over past 6 months.  No UOP today and only one void yesterday.  Last BM this AM.  Last drink was last night.  Saw PCP today who referred her to the ED.  ED Course: In the ED her bilirubin is 21.9, AST 137 alt 46, alk phos 304, WBC 26.8.  Sodium 119 Cl 79, Albumin 2.3.  INR 1.3.  CT abd/pelvis shows ascites and findings suggestive of acute hepatitis.   Review of Systems: As per HPI otherwise 10 point review of systems negative.   History reviewed. No pertinent past medical history.  History reviewed. No pertinent surgical history.   reports that she has been smoking Cigarettes.  She has never used smokeless tobacco. She reports that she drinks alcohol. She reports that she uses drugs.  Allergies  Allergen Reactions  . Pollen Extract     Family History  Problem Relation Age of Onset  . Hyperlipidemia Father   . Heart disease Father      Prior to Admission medications   Not on File    Physical Exam: Vitals:   12/23/16 1935 12/23/16 2022 12/23/16 2100 12/23/16 2121  BP: 1'00/70 95/66 93/66 ' 99/66  Pulse: (!) 101 (!) 103  100  Resp: '16 16 13 ' (!) 22  Temp:      TempSrc:      SpO2: 91% 93%  92%  Weight:      Height:          Constitutional: NAD, calm, comfortable, peripheral muscle wasting Eyes: PERRL, lids and conjunctivae normal ENMT: Mucous membranes are moist. Posterior pharynx clear of any exudate or lesions.Normal dentition.  Neck: normal, supple, no masses, no thyromegaly Respiratory: clear to auscultation bilaterally, no wheezing, no crackles. Normal respiratory effort. No accessory muscle use.  Cardiovascular: Regular rate and rhythm, no murmurs / rubs / gallops. No extremity edema. 2+ pedal pulses. No carotid bruits.  Abdomen: Distended with ascites Musculoskeletal: no clubbing / cyanosis. No joint deformity upper and lower extremities. Good ROM, no contractures. Normal muscle tone.  Skin: no rashes, lesions, ulcers. No induration Neurologic: CN 2-12 grossly intact. Sensation intact, DTR normal. Strength 5/5 in all 4.  Psychiatric: Normal judgment and insight. Alert and oriented x 3. Normal mood.    Labs on Admission: I have personally reviewed following labs and imaging studies  CBC:  Recent Labs Lab 12/23/16 1420  WBC 26.8*  HGB 11.1*  HCT 31.4*  MCV 108.3*  PLT 845   Basic Metabolic Panel:  Recent Labs Lab 12/23/16 1420  NA 119*  K 2.9*  CL 79*  CO2 24  GLUCOSE 126*  BUN 8  CREATININE 1.30*  CALCIUM 7.8*  MG 1.5*   GFR: Estimated Creatinine Clearance: 40.1  mL/min (A) (by C-G formula based on SCr of 1.3 mg/dL (H)). Liver Function Tests:  Recent Labs Lab 12/23/16 1420  AST 137*  ALT 46  ALKPHOS 304*  BILITOT 21.6*  PROT 6.1*  ALBUMIN 2.3*    Recent Labs Lab 12/23/16 1420  LIPASE 23   No results for input(s): AMMONIA in the last 168 hours. Coagulation Profile:  Recent Labs Lab 12/23/16 1420  INR 1.35   Cardiac Enzymes: No results for input(s): CKTOTAL, CKMB, CKMBINDEX, TROPONINI in the last 168 hours. BNP (last 3 results) No results for input(s): PROBNP in the last 8760 hours. HbA1C: No results for input(s): HGBA1C in the last 72 hours. CBG: No  results for input(s): GLUCAP in the last 168 hours. Lipid Profile: No results for input(s): CHOL, HDL, LDLCALC, TRIG, CHOLHDL, LDLDIRECT in the last 72 hours. Thyroid Function Tests: No results for input(s): TSH, T4TOTAL, FREET4, T3FREE, THYROIDAB in the last 72 hours. Anemia Panel: No results for input(s): VITAMINB12, FOLATE, FERRITIN, TIBC, IRON, RETICCTPCT in the last 72 hours. Urine analysis:    Component Value Date/Time   COLORURINE AMBER (A) 12/23/2016 1413   APPEARANCEUR HAZY (A) 12/23/2016 1413   LABSPEC 1.009 12/23/2016 1413   PHURINE 6.0 12/23/2016 1413   GLUCOSEU NEGATIVE 12/23/2016 1413   HGBUR SMALL (A) 12/23/2016 1413   HGBUR large 04/13/2010 1228   BILIRUBINUR MODERATE (A) 12/23/2016 1413   KETONESUR NEGATIVE 12/23/2016 1413   PROTEINUR NEGATIVE 12/23/2016 1413   UROBILINOGEN 0.2 04/13/2010 1228   NITRITE NEGATIVE 12/23/2016 1413   LEUKOCYTESUR NEGATIVE 12/23/2016 1413    Radiological Exams on Admission: US Abdomen Complete  Result Date: 12/23/2016 CLINICAL DATA:  56 y/o F; jaundice, hyperbilirubinemia, question ascites. EXAM: ABDOMEN ULTRASOUND COMPLETE COMPARISON:  None. FINDINGS: Gallbladder: Gallbladder sludge and small stones. Negative sonographic Murphy's sign. No gallbladder wall thickening or pericholecystic fluid. Common bile duct: Diameter: 5.5 mm Liver: Diffusely increased echogenicity. No focal lesion identified. IVC: Poorly visualized. Pancreas: Poorly visualized. Spleen: Size and appearance within normal limits. Right Kidney: Length: 11.3 cm. Echogenicity within normal limits. No mass or hydronephrosis visualized. Left Kidney: Length: 11.4 cm. Echogenicity within normal limits. No mass or hydronephrosis visualized. Abdominal aorta: No aneurysm visualized. Other findings: Small volume of peritoneal ascites, predominantly perihepatic. IMPRESSION: 1. Hepatic steatosis. 2. Gallbladder sludge and small stones. No secondary signs of acute cholecystitis. 3. Small  volume of peritoneal ascites, predominantly perihepatic. Electronically Signed   By: Kristine Garbe M.D.   On: 12/23/2016 19:22   Ct Abdomen Pelvis W Contrast  Result Date: 12/23/2016 CLINICAL DATA:  Jaundice for 1-1/2 months, ascites, RIGHT upper quadrant pain and weight loss. Intermittent chest pressure with bloating sensation for 3 weeks. Abnormal liver function tests, leukocytosis no intrahepatic biliary dilatation. Patent hepatopetal portal vein. EXAM: CT ABDOMEN AND PELVIS WITH CONTRAST TECHNIQUE: Multidetector CT imaging of the abdomen and pelvis was performed using the standard protocol following bolus administration of intravenous contrast. CONTRAST:  31m ISOVUE-300 IOPAMIDOL (ISOVUE-300) INJECTION 61% COMPARISON:  None. FINDINGS: LOWER CHEST: Small bilateral pleural effusions. Heart size is normal. No pericardial effusion. HEPATOBILIARY: The liver is at least 25 cm in cranial caudad dimension and diffusely, heterogeneously hypodense. Edema at the porta hepatis. Normal gallbladder. PANCREAS: Irregularity 13 x 19 mm hypodensity within or above the pancreatic head without ductal dilatation, pseudocyst or calcifications. SPLEEN: Normal. ADRENALS/URINARY TRACT: Kidneys are orthotopic, demonstrating symmetric enhancement. No nephrolithiasis, hydronephrosis or solid renal masses. The unopacified ureters are normal in course and caliber. Delayed imaging through the kidneys demonstrates  symmetric prompt contrast excretion within the proximal urinary collecting system. Urinary bladder is partially distended and unremarkable. Normal adrenal glands. STOMACH/BOWEL: Small hiatal hernia. The stomach, small and large bowel are normal in course and caliber without inflammatory changes. Mild colonic diverticulosis. VASCULAR/LYMPHATIC: Aortoiliac vessels are normal in course and caliber, trace calcific atherosclerosis. No lymphadenopathy by CT size criteria. REPRODUCTIVE: Normal. OTHER: Large volume ascites,  without flattening in the inferior vena cava. No intraperitoneal free air. Mild mesenteric edema without omental or mesenteric mass. MUSCULOSKELETAL: Mild anasarca. IMPRESSION: Severe hypodense hepatomegaly concerning for acute hepatitis. Large volume ascites. No biliary obstruction. Focal hypodensity within or above the pancreatic head, difficult to further characterize due to extensive edema at the porta hepatis. Recommend close attention on follow-up imaging. Small pleural effusions. Electronically Signed   By: Elon Alas M.D.   On: 12/23/2016 19:25    EKG: Independently reviewed.  Assessment/Plan Principal Problem:   Acute alcoholic hepatitis Active Problems:   ETOH abuse    1. Acute alcoholic hepatitis -  1. Acute hepatitis, strongly suspect EtOH as underlying cause given the further history taking.  It is fairly obvious and apparent that EtOH use is much heavier than initially estimated. 2. Repeat CMP in AM 3. Tylenol level pending, acute hepatitis pnl pending 4. GI coming to consult in AM, trying to get a-hold of them again to see if they want me to start steroids given that her Maddrey's score is 37. 5. Per GI: dont start steroids tonight given large vol ascites, WBC 26.8k.  Put on gm of rocephin, dont have IR tap abd yet.  Start on PPI. 6. IVF given low UOP 2. EtOH abuse - CIWA 3. Hypok - replace 4. Hypo mag - replace 5. Hyponatremia - IVF with NS, repeat CMP in AM  DVT prophylaxis: Lovenox Code Status: Full Family Communication: Husband at bedside Disposition Plan: TBD, probably needs inpatient EtOH rehab Consults called: GI - spoke with Dr. Loletha Carrow, recs as above, Dr. Hilarie Fredrickson will see in AM. Admission status: Admit to inpatient   Etta Quill DO [facility] Pager [phone]  If 7AM-7PM, please contact day team taking care of patient [email or web address] Password [email or web address]  12/23/2016, 10:06 PM

## 2016-12-23 NOTE — Progress Notes (Signed)
PHARMACY NOTE:  ANTIMICROBIAL RENAL DOSAGE ADJUSTMENT  Current antimicrobial regimen includes a mismatch between antimicrobial dosage and estimated renal function.  As per policy approved by the Pharmacy & Therapeutics and Medical Executive Committees, the antimicrobial dosage will be adjusted accordingly.  Current antimicrobial dosage:  Rocephin 1 Gm IV q24h  Indication: IAI  Renal Function:  Estimated Creatinine Clearance: 40.1 mL/min (A) (by C-G formula based on SCr of 1.3 mg/dL (H)). []      On intermittent HD, scheduled: []      On CRRT    Antimicrobial dosage has been changed to:  Rocephin 2 Gm IV q24h     Thank you for allowing pharmacy to be a part of this patient's care.  Dorrene German, [facility] 12/23/2016 10:36 PM

## 2016-12-23 NOTE — ED Provider Notes (Signed)
[facility] DEPT Provider Note   CSN: [id number] Arrival date & time: 12/23/16  1348     History   Chief Complaint Chief Complaint  Patient presents with  . Abdominal Pain    HPI Mercedes Mitchell is a 56 y.o. female.  56 year old female who presents with abdominal pain and abdominal distention. The patient went to her PCP today for 2-3 weeks of abdominal pain and distention. She has also had intermittent vomiting during this time. Husband reports 1.5 months of jaundice which first started in her eyes. She has had some diarrhea recently, no blood in her stool. She reports approximately 12 pound weight loss over the past 6 months. She has also had decreased urine output with only one void yesterday and no urine output today. Her last bowel movement was this morning. She reports drinking approximately 4 alcoholic drinks a day, last drink was last night. She denies any withdrawal symptoms or history of withdrawal seizures. Her PCP sent her here for further evaluation.   The history is provided by the patient and the spouse.  Abdominal Pain      History reviewed. No pertinent past medical history.  Patient Active Problem List   Diagnosis Date Noted  . 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 DYSPHAGIA 03/25/2010    History reviewed. No pertinent surgical history.  OB History    No data available       Home Medications    Prior to Admission medications   Not on File    Family History Family History  Problem Relation Age of Onset  . Hyperlipidemia Father   . Heart disease Father     Social History Social History  Substance Use Topics  . Smoking status: Current Every Day Smoker    Types: Cigarettes  . Smokeless tobacco: Never Used  . Alcohol use Yes     Allergies   Pollen extract   Review of Systems Review of Systems  Gastrointestinal: Positive for abdominal pain.   All other systems reviewed and are negative except that which was mentioned in  HPI  Physical Exam Updated Vital Signs BP 99/66 (BP Location: Right Arm)   Pulse 100   Temp 97.8 F (36.6 C) (Oral)   Resp (!) 22   Ht 5\' 6"  (1.676 m)   Wt 113 lb (51.3 kg)   SpO2 92%   BMI 18.24 kg/m   Physical Exam  Constitutional: She is oriented to person, place, and time. No distress.  Thin, peripheral wasting, jaundiced  HENT:  Head: Normocephalic and atraumatic.  Dry mucous membranes  Eyes: Conjunctivae are normal. Pupils are equal, round, and reactive to light. Scleral icterus is present.  Neck: Neck supple.  Cardiovascular: Regular rhythm and normal heart sounds.  Tachycardia present.   No murmur heard. Pulmonary/Chest: Effort normal and breath sounds normal.  Abdominal: Soft. Bowel sounds are normal. She exhibits distension. There is tenderness. There is no rebound and no guarding.  Tenderness of RUQ, midepigastrium with moderate ascites  Musculoskeletal: She exhibits edema.  Pitting edema L ankle and lower leg, no calf tenderness  Neurological: She is alert and oriented to person, place, and time.  No asterixis, Fluent speech  Skin: Skin is warm and dry.  Spider angioma on chest, jaundice  Psychiatric: She has a normal mood and affect. Judgment normal.  Nursing note and vitals reviewed.    ED Treatments / Results  Labs (all labs ordered are listed, but only abnormal results are displayed) Labs Reviewed  COMPREHENSIVE METABOLIC  PANEL - Abnormal; Notable for the following:       Result Value   Sodium 119 (*)    Potassium 2.9 (*)    Chloride 79 (*)    Glucose, Bld 126 (*)    Creatinine, Ser 1.30 (*)    Calcium 7.8 (*)    Total Protein 6.1 (*)    Albumin 2.3 (*)    AST 137 (*)    Alkaline Phosphatase 304 (*)    Total Bilirubin 21.6 (*)    GFR calc non Af Amer 46 (*)    GFR calc Af Amer 53 (*)    Anion gap 16 (*)    All other components within normal limits  CBC - Abnormal; Notable for the following:    WBC 26.8 (*)    RBC 2.90 (*)    Hemoglobin  11.1 (*)    HCT 31.4 (*)    MCV 108.3 (*)    MCH 38.3 (*)    All other components within normal limits  URINALYSIS, ROUTINE W REFLEX MICROSCOPIC - Abnormal; Notable for the following:    Color, Urine AMBER (*)    APPearance HAZY (*)    Hgb urine dipstick SMALL (*)    Bilirubin Urine MODERATE (*)    Bacteria, UA RARE (*)    Squamous Epithelial / LPF 0-5 (*)    All other components within normal limits  BILIRUBIN, DIRECT - Abnormal; Notable for the following:    Bilirubin, Direct 14.4 (*)    All other components within normal limits  MAGNESIUM - Abnormal; Notable for the following:    Magnesium 1.5 (*)    All other components within normal limits  PROTIME-INR - Abnormal; Notable for the following:    Prothrombin Time 16.8 (*)    All other components within normal limits  I-STAT CG4 LACTIC ACID, ED - Abnormal; Notable for the following:    Lactic Acid, Venous 2.01 (*)    All other components within normal limits  CULTURE, BLOOD (ROUTINE X 2)  CULTURE, BLOOD (ROUTINE X 2)  URINE CULTURE  LIPASE, BLOOD  HEPATITIS PANEL, ACUTE  ACETAMINOPHEN LEVEL    EKG  EKG Interpretation  Date/Time:  Thursday Dec 23 2016 16:02:53 EDT Ventricular Rate:  100 PR Interval:    QRS Duration: 96 QT Interval:  383 QTC Calculation: 494 R Axis:   61 Text Interpretation:  Sinus tachycardia Low voltage, precordial leads Borderline prolonged QT interval No previous ECGs available Confirmed by Cashtyn Pouliot MD, Ketih Goodie [phone]) on 12/23/2016 4:29:46 PM       Radiology US Abdomen Complete  Result Date: 12/23/2016 CLINICAL DATA:  56 y/o F; jaundice, hyperbilirubinemia, question ascites. EXAM: ABDOMEN ULTRASOUND COMPLETE COMPARISON:  None. FINDINGS: Gallbladder: Gallbladder sludge and small stones. Negative sonographic Murphy's sign. No gallbladder wall thickening or pericholecystic fluid. Common bile duct: Diameter: 5.5 mm Liver: Diffusely increased echogenicity. No focal lesion identified. IVC: Poorly  visualized. Pancreas: Poorly visualized. Spleen: Size and appearance within normal limits. Right Kidney: Length: 11.3 cm. Echogenicity within normal limits. No mass or hydronephrosis visualized. Left Kidney: Length: 11.4 cm. Echogenicity within normal limits. No mass or hydronephrosis visualized. Abdominal aorta: No aneurysm visualized. Other findings: Small volume of peritoneal ascites, predominantly perihepatic. IMPRESSION: 1. Hepatic steatosis. 2. Gallbladder sludge and small stones. No secondary signs of acute cholecystitis. 3. Small volume of peritoneal ascites, predominantly perihepatic. Electronically Signed   By: Kristine Garbe M.D.   On: 12/23/2016 19:22   Ct Abdomen Pelvis W Contrast  Result Date: 12/23/2016  CLINICAL DATA:  Jaundice for 1-1/2 months, ascites, RIGHT upper quadrant pain and weight loss. Intermittent chest pressure with bloating sensation for 3 weeks. Abnormal liver function tests, leukocytosis no intrahepatic biliary dilatation. Patent hepatopetal portal vein. EXAM: CT ABDOMEN AND PELVIS WITH CONTRAST TECHNIQUE: Multidetector CT imaging of the abdomen and pelvis was performed using the standard protocol following bolus administration of intravenous contrast. CONTRAST:  61mL ISOVUE-300 IOPAMIDOL (ISOVUE-300) INJECTION 61% COMPARISON:  None. FINDINGS: LOWER CHEST: Small bilateral pleural effusions. Heart size is normal. No pericardial effusion. HEPATOBILIARY: The liver is at least 25 cm in cranial caudad dimension and diffusely, heterogeneously hypodense. Edema at the porta hepatis. Normal gallbladder. PANCREAS: Irregularity 13 x 19 mm hypodensity within or above the pancreatic head without ductal dilatation, pseudocyst or calcifications. SPLEEN: Normal. ADRENALS/URINARY TRACT: Kidneys are orthotopic, demonstrating symmetric enhancement. No nephrolithiasis, hydronephrosis or solid renal masses. The unopacified ureters are normal in course and caliber. Delayed imaging through the  kidneys demonstrates symmetric prompt contrast excretion within the proximal urinary collecting system. Urinary bladder is partially distended and unremarkable. Normal adrenal glands. STOMACH/BOWEL: Small hiatal hernia. The stomach, small and large bowel are normal in course and caliber without inflammatory changes. Mild colonic diverticulosis. VASCULAR/LYMPHATIC: Aortoiliac vessels are normal in course and caliber, trace calcific atherosclerosis. No lymphadenopathy by CT size criteria. REPRODUCTIVE: Normal. OTHER: Large volume ascites, without flattening in the inferior vena cava. No intraperitoneal free air. Mild mesenteric edema without omental or mesenteric mass. MUSCULOSKELETAL: Mild anasarca. IMPRESSION: Severe hypodense hepatomegaly concerning for acute hepatitis. Large volume ascites. No biliary obstruction. Focal hypodensity within or above the pancreatic head, difficult to further characterize due to extensive edema at the porta hepatis. Recommend close attention on follow-up imaging. Small pleural effusions. Electronically Signed   By: Elon Alas M.D.   On: 12/23/2016 19:25    Procedures .Critical Care Performed by: Sharlett Iles Authorized by: Sharlett Iles   Critical care provider statement:    Critical care time (minutes):  45   Critical care time was exclusive of:  Separately billable procedures and treating other patients   Critical care was necessary to treat or prevent imminent or life-threatening deterioration of the following conditions:  Metabolic crisis and hepatic failure   Critical care was time spent personally by me on the following activities:  Development of treatment plan with patient or surrogate, discussions with consultants, evaluation of patient's response to treatment, obtaining history from patient or surrogate, examination of patient, ordering and performing treatments and interventions, ordering and review of laboratory studies, ordering and  review of radiographic studies and re-evaluation of patient's condition   (including critical care time)  Medications Ordered in ED Medications  iopamidol (ISOVUE-300) 61 % injection (not administered)  iopamidol (ISOVUE-300) 61 % injection (not administered)  iopamidol (ISOVUE-300) 61 % injection (not administered)  potassium chloride SA (K-DUR,KLOR-CON) CR tablet 40 mEq (not administered)  0.9 %  sodium chloride infusion (not administered)  sodium chloride 0.9 % bolus 1,000 mL (0 mLs Intravenous Stopped 12/23/16 1814)  potassium chloride 10 mEq in 100 mL IVPB (0 mEq Intravenous Stopped 12/23/16 1937)  magnesium sulfate IVPB 2 g 50 mL (0 g Intravenous Stopped 12/23/16 2122)  oxyCODONE (Oxy IR/ROXICODONE) immediate release tablet 5 mg (5 mg Oral Given 12/23/16 1735)  iopamidol (ISOVUE-300) 61 % injection 80 mL (80 mLs Intravenous Contrast Given 12/23/16 1852)     Initial Impression / Assessment and Plan / ED Course  I have reviewed the triage vital signs and the nursing notes.  Pertinent labs & imaging results that were available during my care of the patient were reviewed by me and considered in my medical decision making (see chart for details).     PT w/ several weeks of worsening abdominal pain and distention as well as over a month of jaundice. She was chronically ill-appearing but in no acute distress on exam. Afebrile, heart rate 108, BP 98/72. She had right upper quadrant and midepigastric tenderness with ascites and abdominal distention, jaundice on exam.   Labs with significant abnormalities including sodium 119, potassium 2.9, creatinine 1.3, low albumin and protein, AST 137, alkaline phosphatase 304, total bilirubin 21.6, anion gap 16. WBC 26.8, hemoglobin 11.1. INR is mildly elevated at 1.35. Gave the patient IV potassium, fluid bolus as she appears clinically dehydrated. Magnesium also low therefore gave IV magnesium.  Abdominal ultrasound shows hepatic steatosis, ascites,  gallbladder sludge and small stones but no cholecystitis.  CT shows severe hepatomegaly c/w acute hepatitis, ascites, possible abnormality at pancreatic head. Discussed the patient's lab work and imaging with gastroenterology, and per recommendations added Tylenol and hepatitis panel. Discussed admission with hospitalist, Dr. Alcario Drought, and patient admitted for further care. Final Clinical Impressions(s) / ED Diagnoses   Final diagnoses:  Acute hepatitis  Alcoholism ([place])  Hyponatremia  Hyperkalemia    New Prescriptions New Prescriptions   No medications on file     Sharlett Iles, MD 12/24/16 [phone]

## 2016-12-23 NOTE — Progress Notes (Signed)
*  PRELIMINARY RESULTS* Vascular Ultrasound left lower extremity venous duplex has been completed.  Preliminary findings: No evidence of deep vein thrombosis or Mayfield's cysts in the left lower extremity.  Preliminary results given to nurse at 18:07   Mercedes Mitchell 12/23/2016, 6:06 PM

## 2016-12-23 NOTE — Progress Notes (Signed)
Pre visit review using our clinic review tool, if applicable. No additional management support is needed unless otherwise documented below in the visit note. 

## 2016-12-23 NOTE — Progress Notes (Signed)
HPI:   [name] is a 56 y.o. female, who is here today with her husband to establish care.  Former PCP: Dr Sherren Mocha Last preventive routine visit: 2014 she had her last gyn preventive visit.  Chronic medical problems: Per records dysphagia and anxiety disorder. Currently she is not on treatment.   Concerns today: "Bloating" sensation.  3 weeks of constant bloating sensation and intermittent pressure pain on both rib cages, the latter one seems to be worse when lying down. 2 weeks of diarrhea, prior Hx of constipation, 3 stools per day, loose, with episodes of fecal incontinence. Colonoscopy not found.  A months of nausea and vomiting, decreased appetite, she has about 1-3 vomiting and not daily. It is exacerbated by oral intake, and some "smells" , no alleviating factors identified. + Wt loss, decreased appetite for over a month.  History of dysphagia, according to patient she still has episodes "sometimes", chills denies hematemesis. She denies heartburn. For the past few days she has noted easy bruising, mainly on arms. She denies gross hematuria, blood in stool, gum/nose bleeding.  Decreased urine output for the past few days, urinating once daily, has not had urine output in the past 24 hours. She is drinking water, which she has tolerated well, she has not had vomiting today.  Current inquiry about alcohol intake, she states that she drinks "several drinks" per day. + Smoker. Noted jaundice, she has not noted it,husband states that started maybe 1.5 months ago.   She denies fever, chills, skin rash, erythema, recent travel, sick contacts, recent antibiotic use.   Review of Systems  Constitutional: Positive for activity change, appetite change, fatigue and unexpected weight change. Negative for fever.  HENT: Positive for trouble swallowing. Negative for mouth sores, nosebleeds, sore throat and voice change.   Eyes: Negative for pain and visual disturbance.    Respiratory: Negative for cough, shortness of breath and wheezing.   Cardiovascular: Positive for chest pain (Occasionally left-sided tightness,at rest and for "a second"). Negative for palpitations and leg swelling.  Gastrointestinal: Positive for abdominal distention, abdominal pain (bloating sensation), diarrhea, nausea and vomiting. Negative for blood in stool.  Endocrine: Positive for polydipsia. Negative for cold intolerance, heat intolerance, polyphagia and polyuria.  Genitourinary: Positive for decreased urine volume. Negative for dysuria, hematuria, vaginal bleeding and vaginal discharge.  Musculoskeletal: Positive for gait problem. Negative for back pain and myalgias.  Skin: Positive for pallor. Negative for rash.  Neurological: Positive for tremors and headaches (no more than usual). Negative for seizures and speech difficulty.  Hematological: Negative for adenopathy. Bruises/bleeds easily.  Psychiatric/Behavioral: Negative for confusion. The patient is nervous/anxious.     No current outpatient prescriptions on file prior to visit.   No current facility-administered medications on file prior to visit.     No past medical history on file. Allergies  Allergen Reactions  . Pollen Extract     Family History  Problem Relation Age of Onset  . Hyperlipidemia Father   . Heart disease Father     Social History   Social History  . Marital status: Married    Spouse name: N/A  . Number of children: N/A  . Years of education: N/A   Social History Main Topics  . Smoking status: Current Every Day Smoker    Types: Cigarettes  . Smokeless tobacco: Never Used  . Alcohol use Yes  . Drug use: Yes  . Sexual activity: Not Asked   Other Topics Concern  .  None   Social History Narrative  . None    Vitals:   12/23/16 1153  BP: 100/70  Pulse: (!) 110  Resp: 12   O2 sat at RA 96% Body mass index is 18.24 kg/m.   Physical Exam  Nursing note and vitals  reviewed. Constitutional: She is oriented to person, place, and time. She appears well-developed. No distress.  cachectic   HENT:  Head: Atraumatic.  Mouth/Throat: Oropharynx is clear and moist and mucous membranes are normal.  Eyes: EOM are normal. Pupils are equal, round, and reactive to light. Scleral icterus is present.  Neck: No tracheal deviation present. No thyroid mass and no thyromegaly present.  Cardiovascular: Regular rhythm.  Tachycardia present.   No murmur heard. Pulses:      Dorsalis pedis pulses are 2+ on the right side, and 2+ on the left side.  Respiratory: Effort normal and breath sounds normal. No respiratory distress.  GI: Soft. Bowel sounds are normal. She exhibits fluid wave and ascites. There is hepatomegaly. There is no tenderness.  Musculoskeletal: She exhibits no edema.  Lymphadenopathy:    She has no cervical adenopathy.       Right: No supraclavicular adenopathy present.       Left: No supraclavicular adenopathy present.  Neurological: She is alert and oriented to person, place, and time. She displays tremor (Mild,hands and head). No cranial nerve deficit. Gait abnormal.  Unstable wide base gait, assisted by holding husband, tilted towards right.  Skin: Skin is warm. No rash noted. No erythema. There is pallor.  Icteric   Psychiatric: She has a normal mood and affect.  Well groomed, good eye contact.     ASSESSMENT AND PLAN:   Mercedes Mitchell was seen today for establish care.  Diagnoses and all orders for this visit:  Oliguria and anuria  Most of studies could have been arranged as outpatient but because reported decreased urine output, she was referred to ER today. She is going to need extensive work up and IVF. Case was discussed with hospitalist , Dr Marily Memos, who recommended referring pt to the ER instead direct admission given the fact she is hemodynamically stable and in no acute distress.  Other ascites  We discussed possible etiologies, cirrhosis  most likely given her history of alcohol abuse but also malignancies need to be considered. Abdominal imaging is going to be necessary, she also may need paracentesis to relieve discomfort.  Jaundice of recent onset  ? Alcoholic cirrhosis with ascitics. ?Pancreatic malignancy. Overall she does not seem to be in acute distress, given the fact she has not noted and husband was not very concerned about this make me believe this might has been going on longer than 1.5 months.   Alcoholism /alcohol abuse Mercedes Mitchell)  Husband states that his major concerned today is treatment of alcohol problems, he is requesting admission to a "detox program."  I explained that certainly this is important but this could be arranged later on and after ruling out serious illness. He could ask ER provider if this can be arranged through the ER. I did strongly recommend start decreasing alcohol intake.     Mercedes G. Martinique, MD  [facility]. [place] office.

## 2016-12-23 NOTE — Patient Instructions (Signed)
Sent to ER

## 2016-12-23 NOTE — ED Notes (Signed)
US at bedside

## 2016-12-24 ENCOUNTER — Encounter (HOSPITAL_COMMUNITY): Payer: Self-pay | Admitting: Physician Assistant

## 2016-12-24 ENCOUNTER — Inpatient Hospital Stay (HOSPITAL_COMMUNITY): Payer: Managed Care, Other (non HMO)

## 2016-12-24 DIAGNOSIS — R17 Unspecified jaundice: Secondary | ICD-10-CM

## 2016-12-24 DIAGNOSIS — K7011 Alcoholic hepatitis with ascites: Principal | ICD-10-CM

## 2016-12-24 DIAGNOSIS — D72829 Elevated white blood cell count, unspecified: Secondary | ICD-10-CM

## 2016-12-24 DIAGNOSIS — F1010 Alcohol abuse, uncomplicated: Secondary | ICD-10-CM

## 2016-12-24 DIAGNOSIS — E43 Unspecified severe protein-calorie malnutrition: Secondary | ICD-10-CM

## 2016-12-24 DIAGNOSIS — K920 Hematemesis: Secondary | ICD-10-CM

## 2016-12-24 DIAGNOSIS — D689 Coagulation defect, unspecified: Secondary | ICD-10-CM

## 2016-12-24 DIAGNOSIS — K7010 Alcoholic hepatitis without ascites: Secondary | ICD-10-CM

## 2016-12-24 DIAGNOSIS — B179 Acute viral hepatitis, unspecified: Secondary | ICD-10-CM

## 2016-12-24 LAB — ETHANOL: Alcohol, Ethyl (B): <5 mg/dL (ref <5)

## 2016-12-24 LAB — RAPID URINE DRUG SCREEN, HOSP PERFORMED
AMPHETAMINES: NOT DETECTED
BARBITURATES: NOT DETECTED
Benzodiazepines: NOT DETECTED
Cocaine: NOT DETECTED
OPIATES: NOT DETECTED
TETRAHYDROCANNABINOL: NOT DETECTED

## 2016-12-24 LAB — COMPREHENSIVE METABOLIC PANEL
ALBUMIN: 1.8 g/dL — AB (ref 3.5–5.0)
ALT: 37 U/L (ref 14–54)
AST: 113 U/L — AB (ref 15–41)
Alkaline Phosphatase: 234 U/L — ABNORMAL HIGH (ref 38–126)
Anion gap: 14 (ref 5–15)
BUN: 10 mg/dL (ref 6–20)
CHLORIDE: 84 mmol/L — AB (ref 101–111)
CO2: 22 mmol/L (ref 22–32)
CREATININE: 1.01 mg/dL — AB (ref 0.44–1.00)
Calcium: 7 mg/dL — ABNORMAL LOW (ref 8.9–10.3)
GFR calc Af Amer: >60 mL/min (ref >60)
GFR calc non Af Amer: >60 mL/min (ref >60)
GLUCOSE: 108 mg/dL — AB (ref 65–99)
POTASSIUM: 2.8 mmol/L — AB (ref 3.5–5.1)
SODIUM: 120 mmol/L — AB (ref 135–145)
Total Bilirubin: 17.8 mg/dL — ABNORMAL HIGH (ref 0.3–1.2)
Total Protein: 5.1 g/dL — ABNORMAL LOW (ref 6.5–8.1)

## 2016-12-24 LAB — CBC
HCT: 26.5 % — ABNORMAL LOW (ref 36.0–46.0)
Hemoglobin: 9.4 g/dL — ABNORMAL LOW (ref 12.0–15.0)
MCH: 38.8 pg — ABNORMAL HIGH (ref 26.0–34.0)
MCHC: 35.5 g/dL (ref 30.0–36.0)
MCV: 109.5 fL — AB (ref 78.0–100.0)
PLATELETS: 285 10*3/uL (ref 150–400)
RBC: 2.42 MIL/uL — AB (ref 3.87–5.11)
RDW: 15.4 % (ref 11.5–15.5)
WBC: 24.8 10*3/uL — AB (ref 4.0–10.5)

## 2016-12-24 LAB — HIV ANTIBODY (ROUTINE TESTING W REFLEX): HIV SCREEN 4TH GENERATION: NONREACTIVE

## 2016-12-24 LAB — BODY FLUID CELL COUNT WITH DIFFERENTIAL
LYMPHS FL: 5 %
Monocyte-Macrophage-Serous Fluid: 73 % (ref 50–90)
NEUTROPHIL FLUID: 22 % (ref 0–25)
Total Nucleated Cell Count, Fluid: 57 cu mm (ref 0–1000)

## 2016-12-24 LAB — PROTEIN, PLEURAL OR PERITONEAL FLUID: Total protein, fluid: <3 g/dL

## 2016-12-24 LAB — GRAM STAIN

## 2016-12-24 LAB — ALBUMIN, PLEURAL OR PERITONEAL FLUID

## 2016-12-24 LAB — PROTIME-INR
INR: 1.61
Prothrombin Time: 19.3 seconds — ABNORMAL HIGH (ref 11.4–15.2)

## 2016-12-24 LAB — GLUCOSE, PLEURAL OR PERITONEAL FLUID: Glucose, Fluid: 119 mg/dL

## 2016-12-24 MED ORDER — ALUM & MAG HYDROXIDE-SIMETH 200-200-20 MG/5ML PO SUSP
15.0000 mL | ORAL | Status: DC | PRN
  Administered 2016-12-24 – 2016-12-25 (×2): 15 mL via ORAL
  Filled 2016-12-24 (×2): qty 30

## 2016-12-24 MED ORDER — PREDNISOLONE 5 MG PO TABS
40.0000 mg | ORAL_TABLET | Freq: Every day | ORAL | Status: DC
  Administered 2016-12-24 – 2016-12-28 (×5): 40 mg via ORAL
  Filled 2016-12-24 (×5): qty 8

## 2016-12-24 MED ORDER — PHYTONADIONE 5 MG PO TABS
10.0000 mg | ORAL_TABLET | Freq: Every day | ORAL | Status: AC
  Administered 2016-12-24 – 2016-12-28 (×5): 10 mg via ORAL
  Filled 2016-12-24 (×5): qty 2

## 2016-12-24 MED ORDER — ENSURE ENLIVE PO LIQD
237.0000 mL | Freq: Three times a day (TID) | ORAL | Status: DC
  Administered 2016-12-24 – 2016-12-28 (×8): 237 mL via ORAL

## 2016-12-24 MED ORDER — ALBUMIN HUMAN 25 % IV SOLN
12.5000 g | Freq: Four times a day (QID) | INTRAVENOUS | Status: AC
  Administered 2016-12-24 (×2): 12.5 g via INTRAVENOUS
  Filled 2016-12-24 (×2): qty 50

## 2016-12-24 NOTE — Consult Note (Signed)
[facility] Consult: 10:09 AM 12/24/2016  LOS: 1 day    Referring Provider: Dr Ree Kida  Primary Care Physician:  Betty Martinique MD at [facility].  Primary Gastroenterologist:  unassigned  Reason for Consultation:  Alcoholic hepatitis.  FOBT + stools, nausea, dysphagia.    HPI: Mercedes Mitchell is a 56 y.o. female.  PMH dysphagia and anxiety/panic attacks.   No previous GI referrals or testing.      Seen by new PMD to establish care yesterday.  Complaints of abdominal bloating, diarrhea for 3 weeks but previously constipated.  Anorexia, dysphagia, weight loss, intermittent N/V for ~ 1 months.  Polydipsia.   Oliguria in last few days.  Observed to be jaundiced, present for 6 weeks or so.  She admitted to several ETOH drinks per day.  Ascites on exam.  Husband requested ETOH detox for his wife.   Due to reported oliguria, sent to ED for admission and prompt workup.    T bili 21.  AST/ALT 137/46.  Alk phos 304.  Lipase normal. Na 119.  Slight increase creatinine.  Glucose 120s.  K 2.8.  WBCs 26.8.  Hgb 11.1 > 9.4 post hydration.  coags 19.3/1.6.   APAP level <10.  No urine sodium.  ETOH <5.   CT ab/pelvis with contrast: severe hypodense hepatomegaly concerning for acute hepatitis.  Large ascites.  Focal hypodensity within or above the pancreatic head, difficult to further characterize due to extensive edema at the porta hepatis.  Recommend close attention on follow-up imaging.   Abdominal ultrasound: fatty liver, GB sludge, stones.  No cholecystitis.  Small peritoneal, perihepatic ascites.   Symptoms started about 6 weeks ago at which point her appetite decreased, she was having early satiety. Vomiting of clear material has accelerated in the last few days to once a day. The nausea and vomiting seems to be triggered by  certain tastes and smells food. Dysphagia present, mostly to solids, sometimes liquids and occurs in the region of the upper esophageal sphincter. She feels like she is going to choke and will cough with by mouth intake. This is not a consistent problem. Stools are looser, thus the moniker of diarrhea, they occur about once a day. However last night, after admission she had dark stool and, also for the first time, dark-looking emesis. This has not happened since last night. Some mild to moderate discomfort in the lower abdomen which she attributes to abdominal distention which has occurred in the last 10 days. She is not having extremity edema. Functionally she has been doing quite well. She goes to the gym 3 times a week where she does aerobic and anaerobic exercise for 30 minutes. Patient has never undergone any colonoscopy or upper endoscopy.  Every few weeks she uses 400 mg of ibuprofen for body aches or headache. Alcohol consumption consists of vodka which is normally about 3 to 3.75 liters over the course of a week. Her drinking begins in the morning and lasts all day. She's never made any effort to quit or go to rehabilitation. However she says that her  current consumption is a little bit less than previous because she thought she would try to slowly quit on her own but realizes this is probably not possible. Family history significant for death from alcohol-related problems, probably cirrhosis, in both her mother and sister.   Past Medical History:  Diagnosis Date  . Dysphagia 2011  . Panic type anxiety neurosis 2011    Past Surgical History:  Procedure Laterality Date  . APPENDECTOMY      Prior to Admission medications   Not on File    Scheduled Meds: . enoxaparin (LOVENOX) injection  40 mg Subcutaneous QHS  . feeding supplement  1 Container Oral TID BM  . folic acid  1 mg Oral Daily  . multivitamin with minerals  1 tablet Oral Daily  . pantoprazole  40 mg Oral Daily  . sodium  chloride flush  3 mL Intravenous Q12H  . thiamine  100 mg Oral Daily   Or  . thiamine  100 mg Intravenous Daily   Infusions: . sodium chloride 75 mL/hr at 12/23/16 2143  . cefTRIAXone (ROCEPHIN)  IV Stopped (12/24/16 0025)   PRN Meds: alum & mag hydroxide-simeth, LORazepam **OR** LORazepam, ondansetron (ZOFRAN) IV   Allergies as of 12/23/2016 - Review Complete 12/23/2016  Allergen Reaction Noted  . Pollen extract  12/23/2016    Family History  Problem Relation Age of Onset  . Hyperlipidemia Father   . Heart disease Father     Social History   Social History  . Marital status: Married    Spouse name: N/A  . Number of children: N/A  . Years of education: N/A   Occupational History  . Not on file.   Social History Main Topics  . Smoking status: Current Every Day Smoker    Types: Cigarettes  . Smokeless tobacco: Never Used  . Alcohol use Yes  . Drug use: Yes  . Sexual activity: Not on file   Other Topics Concern  . Not on file   Social History Narrative  . No narrative on file    REVIEW OF SYSTEMS: Constitutional:  No significant fatigue or weakness. Weight loss is probably about 12 pounds over the last 6 weeks. ENT:  No nose bleeds Pulm:  He smokes 1 cigarette per day. Cough is mostly nonproductive. CV:  No palpitations, no LE edema.  No chest pain GU:  No hematuria, no frequency GI:  Per HPI Heme:  Doesn't suffer from excessive bleeding but does bruise quite easily.   Transfusions:  No previous blood transfusions. Neuro:  No headaches, no peripheral tingling or numbness Derm:  No itching, no rash or sores.  Endocrine:  No sweats or chills.  No polyuria or dysuria Immunization:  Not queried.     PHYSICAL EXAM: Vital signs in last 24 hours: Vitals:   12/23/16 2300 12/24/16 0500  BP: 95/65 90/62  Pulse: (!) 103 (!) 103  Resp: 18 18  Temp: 97.6 F (36.4 C) 98.5 F (36.9 C)   Wt Readings from Last 3 Encounters:  12/23/16 51.3 kg (113 lb)    12/23/16 51.3 kg (113 lb)  04/13/10 60.8 kg (134 lb)    General: Jaundiced, cachectic/skeletal appearing WF. She is comfortable, alert. Head:  No facial asymmetry or swelling. No signs of head trauma.  Eyes:  Icteric sclera. Conjunctiva pink. Ears:  No hearing deficit  Nose:  No nasal discharge or congestion Mouth:  Good dental repair. Tongue midline. Oral mucosa moist, pink and clear. Neck:  No JVD, no  masses, no thyromegaly. Lungs:  Diminished breath sounds on the left, no adventitious sounds. No dyspnea. No cough. Heart: RRR. No MRG. S1, S2 present. Abdomen:  Mildly protuberant. Slightly bulging flanks. Not tender. No masses, no tenderness. Positive hepatomegaly..   Rectal: Stool is soft to near liquid consistency. It is black/brown and 2-3 plus FOBT positive. Small external hemorrhoidal tag. No red blood.   Musc/Skeltl: No gross joint swelling, contractures or deformity. Extremities:  Muscle wasting in the arms and legs. No swelling.  Neurologic:  Patient alert. Appropriate. Oriented times 3. No asterixis or tremor. Fluid speech. Calm. Skin:  Jaundiced. Telangiectasias on the upper chest. Tattoos:  None Nodes:  No cervical or inguinal adenopathy.   Psych:  Cooperative, calm, pleasant.  Intake/Output from previous day: 05/03 0701 - 05/04 0700 In: 1850 [I.V.:450; IV Piggyback:1400] Out: 400 [Emesis/NG output:400] Intake/Output this shift: No intake/output data recorded.  LAB RESULTS:  Recent Labs  12/23/16 1420 12/24/16 0656  WBC 26.8* 24.8*  HGB 11.1* 9.4*  HCT 31.4* 26.5*  PLT 327 285   BMET Lab Results  Component Value Date   NA 120 (L) 12/24/2016   NA 119 (LL) 12/23/2016   NA 141 03/25/2010   K 2.8 (L) 12/24/2016   K 2.9 (L) 12/23/2016   K 5.4 (H) 03/25/2010   CL 84 (L) 12/24/2016   CL 79 (L) 12/23/2016   CL 100 03/25/2010   CO2 22 12/24/2016   CO2 24 12/23/2016   CO2 29 03/25/2010   GLUCOSE 108 (H) 12/24/2016   GLUCOSE 126 (H) 12/23/2016   GLUCOSE 93  03/25/2010   BUN 10 12/24/2016   BUN 8 12/23/2016   BUN 11 03/25/2010   CREATININE 1.01 (H) 12/24/2016   CREATININE 1.30 (H) 12/23/2016   CREATININE 0.7 03/25/2010   CALCIUM 7.0 (L) 12/24/2016   CALCIUM 7.8 (L) 12/23/2016   CALCIUM 9.9 03/25/2010   LFT  Recent Labs  12/23/16 1420 12/24/16 0656  PROT 6.1* 5.1*  ALBUMIN 2.3* 1.8*  AST 137* 113*  ALT 46 37  ALKPHOS 304* 234*  BILITOT 21.6* 17.8*  BILIDIR 14.4*  --    PT/INR Lab Results  Component Value Date   INR 1.61 12/24/2016   INR 1.35 12/23/2016   Hepatitis Panel No results for input(s): HEPBSAG, HCVAB, HEPAIGM, HEPBIGM in the last 72 hours. C-Diff No components found for: CDIFF Lipase     Component Value Date/Time   LIPASE 23 12/23/2016 1420    Drugs of Abuse  No results found for: LABOPIA, COCAINSCRNUR, LABBENZ, AMPHETMU, THCU, LABBARB   RADIOLOGY STUDIES: US Abdomen Complete  Result Date: 12/23/2016 CLINICAL DATA:  56 y/o F; jaundice, hyperbilirubinemia, question ascites. EXAM: ABDOMEN ULTRASOUND COMPLETE COMPARISON:  None. FINDINGS: Gallbladder: Gallbladder sludge and small stones. Negative sonographic Murphy's sign. No gallbladder wall thickening or pericholecystic fluid. Common bile duct: Diameter: 5.5 mm Liver: Diffusely increased echogenicity. No focal lesion identified. IVC: Poorly visualized. Pancreas: Poorly visualized. Spleen: Size and appearance within normal limits. Right Kidney: Length: 11.3 cm. Echogenicity within normal limits. No mass or hydronephrosis visualized. Left Kidney: Length: 11.4 cm. Echogenicity within normal limits. No mass or hydronephrosis visualized. Abdominal aorta: No aneurysm visualized. Other findings: Small volume of peritoneal ascites, predominantly perihepatic. IMPRESSION: 1. Hepatic steatosis. 2. Gallbladder sludge and small stones. No secondary signs of acute cholecystitis. 3. Small volume of peritoneal ascites, predominantly perihepatic. Electronically Signed   By: Kristine Garbe M.D.   On: 12/23/2016 19:22   Ct Abdomen Pelvis W Contrast  Result  Date: 12/23/2016 CLINICAL DATA:  Jaundice for 1-1/2 months, ascites, RIGHT upper quadrant pain and weight loss. Intermittent chest pressure with bloating sensation for 3 weeks. Abnormal liver function tests, leukocytosis no intrahepatic biliary dilatation. Patent hepatopetal portal vein. EXAM: CT ABDOMEN AND PELVIS WITH CONTRAST TECHNIQUE: Multidetector CT imaging of the abdomen and pelvis was performed using the standard protocol following bolus administration of intravenous contrast. CONTRAST:  23m ISOVUE-300 IOPAMIDOL (ISOVUE-300) INJECTION 61% COMPARISON:  None. FINDINGS: LOWER CHEST: Small bilateral pleural effusions. Heart size is normal. No pericardial effusion. HEPATOBILIARY: The liver is at least 25 cm in cranial caudad dimension and diffusely, heterogeneously hypodense. Edema at the porta hepatis. Normal gallbladder. PANCREAS: Irregularity 13 x 19 mm hypodensity within or above the pancreatic head without ductal dilatation, pseudocyst or calcifications. SPLEEN: Normal. ADRENALS/URINARY TRACT: Kidneys are orthotopic, demonstrating symmetric enhancement. No nephrolithiasis, hydronephrosis or solid renal masses. The unopacified ureters are normal in course and caliber. Delayed imaging through the kidneys demonstrates symmetric prompt contrast excretion within the proximal urinary collecting system. Urinary bladder is partially distended and unremarkable. Normal adrenal glands. STOMACH/BOWEL: Small hiatal hernia. The stomach, small and large bowel are normal in course and caliber without inflammatory changes. Mild colonic diverticulosis. VASCULAR/LYMPHATIC: Aortoiliac vessels are normal in course and caliber, trace calcific atherosclerosis. No lymphadenopathy by CT size criteria. REPRODUCTIVE: Normal. OTHER: Large volume ascites, without flattening in the inferior vena cava. No intraperitoneal free air. Mild mesenteric  edema without omental or mesenteric mass. MUSCULOSKELETAL: Mild anasarca. IMPRESSION: Severe hypodense hepatomegaly concerning for acute hepatitis. Large volume ascites. No biliary obstruction. Focal hypodensity within or above the pancreatic head, difficult to further characterize due to extensive edema at the porta hepatis. Recommend close attention on follow-up imaging. Small pleural effusions. Electronically Signed   By: CElon AlasM.D.   On: 12/23/2016 19:25     IMPRESSION:   *  ETOH hepatitis, jaundice.  ? Underlying cirrhosis. Maddrey's discriminant function ~ 38, so could benefit from prednisolone.  *  Ascites.  Will need paracentesis and studies to r/o SBP as well as fluid albumin to calculate SAAG.    *  r/o pancreatic head density.   *  Dark, FOBT positive loose stool and 1, single, cold-like emesis overnight with recent history of daily, non-coffee ground, emesis. r/o ulcers, MW tear, portal hypertensive gatropathy, esophageal or gastric varices (though these not seen on CT)  *  Macrocytic anemia.    *  Weight loss, anorexia,   *  Dysphagia.  r/o neoplasm vs reflux dz with stricture vs infectious esophagitis  *  Oliguria.  Kidneys benign on CT and ultrasound.  U/A with some 6 -30 WBCs, no leuks no nitrites.  Renal function with improved, mild elevated creatinine.     *  Hyponatremia.     PLAN:     *  EGD tomorrow, unable to do today since she had 3/4 of bottle of Ensure within the last hour.  Clears and ensure for the rest of the day.    *  Ultrasound-guided paracentesis with fluid studies for cell count/differential, cultures, albumin.   *  Added Prednisolone 40 mg daily, for 28 days.   *  Will need lasix, aldactone but wait on this to see how kidneys react to paracentesis.    *  Currently pending labs include acute hepatitis panel, HIV.  *  Empiric Rocephin is in place.  Protonix in place.  CIWA protocol, Thiamine, B12  in place.  Adding Vitamin K po.   *  Will eventually need a screening colonoscopy, however this is on the back burner.    *  AM labs: CBC, CMET, B12, Folate, Coags.    *  Follow up CT vs MRI/MRCP to look into the ? Of pancreatic head density, may be best done when ascites under better control.     Azucena Freed  12/24/2016, 10:09 AM Pager: [phone]

## 2016-12-24 NOTE — Progress Notes (Signed)
CSW attempted to provide patient with resources however was in procedure. Will return at another time.   Kingsley Spittle, LCSWA Clinical Social Worker [phone]

## 2016-12-24 NOTE — Procedures (Signed)
PROCEDURE SUMMARY:  Successful US guided paracentesis from right lateral abdomen.  Yielded 2.6 liters of bright yellow fluid.  No immediate complications.  Pt tolerated well.   Specimen was sent for labs.  Docia Barrier PA-C 12/24/2016 3:41 PM

## 2016-12-24 NOTE — Progress Notes (Signed)
Initial Nutrition Assessment  DOCUMENTATION CODES:   Severe malnutrition in context of chronic illness  INTERVENTION:   Boost Breeze po TID, each supplement provides 250 kcal and 9 grams of protein  Ensure Enlive po BID, each supplement provides 350 kcal and 20 grams of protein  Magic cup TID with meals, each supplement provides 290 kcal and 9 grams of protein  MVI  NUTRITION DIAGNOSIS:   Malnutrition (severe) related to chronic illness, other (see comment) (etoh abuse), hepatitis as evidenced by severe depletion of muscle mass, severe depletion of body fat.  GOAL:   Patient will meet greater than or equal to 90% of their needs  MONITOR:   PO intake, Supplement acceptance, Labs, I & O's  REASON FOR ASSESSMENT:   Malnutrition Screening Tool    ASSESSMENT:   56 y.o. female with medical history significant of ongoing EtOH abuse. Patient presents to the ED with c/o 1.5 months of Jaundice and now 2-3 weeks of Abd pain and distention with intermittent vomiting during this time.  Pt admitted for alcoholic hepatitis    Met with pt in room today. Pt reports poor appetite and oral intake for six months pta. Pt reports bloating, diarrhea, and intermittent vomiting  for 3 weeks pta. Pt also reports dysphagia and difficulty swallowing food.  Pt currently on clear liquid diet w/ Ensure. Pt is scheduled to have possible paracentesis today and likely EGD tomorrow. Pt reports weight loss of 12lbs in 6 months but difficult to assess pt's true weight r/t ascites. Pt reports that she used to weigh 118lbs 6 months ago and had got down to 105lbs at one point. Encourage intake of meals and supplements. Pt with hypokalemia and hyponatremia; monitor and supplement as needed per MD discretion.   Medications reviewed and include: lovenox, folic acid, MVI, protonix, thiamine, ceftriaxone, maalox, zofran    Labs reviewed: Na 120(L), K 2.8(L), Cl 84(L), creat 1.01(H), Ca 7.0(L) adj. 8.76(L), AlkPhos-  234(H), Alb 1.8(L), AST 113(H), tbili 17.8(H) Wbc- 24.8(H), Hgb 9.4(L), Hct 26.5(H)  Nutrition-Focused physical exam completed. Findings are severe fat depletion, severe muscle depletion, and mild edema. Pt with ascites and severe jaundice.    Diet Order:  Diet clear liquid Room service appropriate? Yes; Fluid consistency: Thin Diet NPO time specified  Skin:  Reviewed, no issues (ascites)  Last BM:  5/3  Height:   Ht Readings from Last 1 Encounters:  12/23/16 '5\' 6"'  (1.676 m)    Weight:   Wt Readings from Last 1 Encounters:  12/23/16 113 lb (51.3 kg)    Ideal Body Weight:  59 kg  BMI:  Body mass index is 18.24 kg/m.  Estimated Nutritional Needs:   Kcal:  1650-1950kcal/day   Protein:  77-87g/day   Fluid:  >1.6L/day   EDUCATION NEEDS:   No education needs identified at this time  Koleen Distance, RD, LDN Pager #[phone]

## 2016-12-24 NOTE — Progress Notes (Signed)
PROGRESS NOTE    Mercedes Mitchell  GYI:[id number] DOB: 05/13/1961 DOA: 12/23/2016 PCP: No primary care provider on file.   Chief Complaint  Patient presents with  . Abdominal Pain    Brief Narrative:  HPI On 12/23/2016 by Dr. Jennette Kettle Mercedes Mitchell is a 56 y.o. female with medical history significant of ongoing EtOH abuse.  After discussion with patient and husband, I suspect patient is somewhat under-estimating her EtOH intake which seems to be in the realm of 2.5 gallons per week per husband.  Both agree that they are concerned that it might be too much. Patient presents to the ED with c/o 1.5 months of Jaundice and now 2-3 weeks of Abd pain and distention with intermittent vomiting during this time.  12 lb weight loss over past 6 months.  No UOP today and only one void yesterday.  Last BM this AM.  Last drink was last night. Saw PCP today who referred her to the ED.  Assessment & Plan   Acute alcoholic hepatitis with elevated bilirubin and transaminases -CT Abdomen and pelvis showed severe hypodense hepatomegaly concerning for acute hepatitis. Large-volume ascites.  -[place] abd: Hepatic steatosis, gallbladder sludge and small stones. No signs of acute cholecystitis. Small volume of peritoneal ascites -Gastroenterology consulted and appreciated -Hepatitis panel pending  -Continue PPI -bilirubin and LFTs improving -Continue to monitor CMP -Prednisolone added by GI -Continue ceftriaxone -Ordered US guided paracentesis with labs to follow (if enough fluild for tap) -Plan for EGD on 12/25/2016  Alcohol abuse -Drinks approximately 2/3 of 1/5th bottle of vodka daily -Alcohol level <5 -Currently not in withdrawal -Continue CIWA protocol, multivitamin, folic acid, and thiamine  Macrocytic anemia -Unknown baseline hemoglobin however 2011 hemoglobin was 14.5. -Currently hemoglobin 9.4 -Continue to monitor CBC Severe malnutrition -Nutrition consulted -Continue feeding  supplements  Hyponatremia -Continue IV fluids, monitor BMP  Hypokalemia -Replaced, continue to monitor BMP  Hypomagnesemia  -Replaced, continue to monitor  DVT Prophylaxis  Lovenox  Code Status: Full  Family Communication: None at bedside  Disposition Plan: Admitted  Consultants Gastroenterology  Procedures  Abdominal US  Antibiotics   Anti-infectives    Start     Dose/Rate Route Frequency Ordered Stop   12/23/16 2245  cefTRIAXone (ROCEPHIN) 2 g in dextrose 5 % 50 mL IVPB     2 g 100 mL/hr over 30 Minutes Intravenous Daily at bedtime 12/23/16 2227        Subjective:   Mercedes Mitchell seen and examined today.  Patient denies chest pain, shortness of breath, or current nausea or vomiting. Has had diarrhea. Endorses poor appetite over the past several weeks with weight loss.   Objective:   Vitals:   12/23/16 2210 12/23/16 2300 12/24/16 0500 12/24/16 1318  BP: (!) 87/77 95/65 90/62    Pulse: 100 (!) 103 (!) 103   Resp: 16 18 18    Temp:  97.6 F (36.4 C) 98.5 F (36.9 C)   TempSrc:  Oral Oral   SpO2: 97% 100% 100%   Weight:    54 kg (119 lb)  Height:        Intake/Output Summary (Last 24 hours) at 12/24/16 1406 Last data filed at 12/24/16 1045  Gross per 24 hour  Intake             1970 ml  Output              401 ml  Net             1569  ml   Filed Weights   12/23/16 1410 12/24/16 1318  Weight: 51.3 kg (113 lb) 54 kg (119 lb)    Exam  General: Well developed, Cachectic, NAD  HEENT: NCAT, Icteric sclera, mucous membranes moist.   Neck: Supple, no JVD, no masses  Cardiovascular: S1 S2 auscultated, no rubs, murmurs or gallops. Regular rate and rhythm.  Respiratory: Clear to auscultation bilaterally  Abdomen: Soft, nontender, +distended, + bowel sounds, +hepatomegaly  Extremities: warm dry without cyanosis clubbing. LE edema   Neuro: AAOx3, nonfocal  Skin: Jaundice  Psych: Normal affect and demeanor   Data Reviewed: I have personally reviewed  following labs and imaging studies  CBC:  Recent Labs Lab 12/23/16 1420 12/24/16 0656  WBC 26.8* 24.8*  HGB 11.1* 9.4*  HCT 31.4* 26.5*  MCV 108.3* 109.5*  PLT 327 177   Basic Metabolic Panel:  Recent Labs Lab 12/23/16 1420 12/24/16 0656  NA 119* 120*  K 2.9* 2.8*  CL 79* 84*  CO2 24 22  GLUCOSE 126* 108*  BUN 8 10  CREATININE 1.30* 1.01*  CALCIUM 7.8* 7.0*  MG 1.5*  --    GFR: Estimated Creatinine Clearance: 54.3 mL/min (A) (by C-G formula based on SCr of 1.01 mg/dL (H)). Liver Function Tests:  Recent Labs Lab 12/23/16 1420 12/24/16 0656  AST 137* 113*  ALT 46 37  ALKPHOS 304* 234*  BILITOT 21.6* 17.8*  PROT 6.1* 5.1*  ALBUMIN 2.3* 1.8*    Recent Labs Lab 12/23/16 1420  LIPASE 23   No results for input(s): AMMONIA in the last 168 hours. Coagulation Profile:  Recent Labs Lab 12/23/16 1420 12/24/16 0656  INR 1.35 1.61   Cardiac Enzymes: No results for input(s): CKTOTAL, CKMB, CKMBINDEX, TROPONINI in the last 168 hours. BNP (last 3 results) No results for input(s): PROBNP in the last 8760 hours. HbA1C: No results for input(s): HGBA1C in the last 72 hours. CBG: No results for input(s): GLUCAP in the last 168 hours. Lipid Profile: No results for input(s): CHOL, HDL, LDLCALC, TRIG, CHOLHDL, LDLDIRECT in the last 72 hours. Thyroid Function Tests: No results for input(s): TSH, T4TOTAL, FREET4, T3FREE, THYROIDAB in the last 72 hours. Anemia Panel: No results for input(s): VITAMINB12, FOLATE, FERRITIN, TIBC, IRON, RETICCTPCT in the last 72 hours. Urine analysis:    Component Value Date/Time   COLORURINE AMBER (A) 12/23/2016 1413   APPEARANCEUR HAZY (A) 12/23/2016 1413   LABSPEC 1.009 12/23/2016 1413   PHURINE 6.0 12/23/2016 1413   GLUCOSEU NEGATIVE 12/23/2016 1413   HGBUR SMALL (A) 12/23/2016 1413   HGBUR large 04/13/2010 1228   BILIRUBINUR MODERATE (A) 12/23/2016 1413   KETONESUR NEGATIVE 12/23/2016 1413   PROTEINUR NEGATIVE 12/23/2016  1413   UROBILINOGEN 0.2 04/13/2010 1228   NITRITE NEGATIVE 12/23/2016 1413   LEUKOCYTESUR NEGATIVE 12/23/2016 1413   Sepsis Labs: @LABRCNTIP (procalcitonin:4,lacticidven:4)  )No results found for this or any previous visit (from the past 240 hour(s)).    Radiology Studies: US Abdomen Complete  Result Date: 12/23/2016 CLINICAL DATA:  56 y/o F; jaundice, hyperbilirubinemia, question ascites. EXAM: ABDOMEN ULTRASOUND COMPLETE COMPARISON:  None. FINDINGS: Gallbladder: Gallbladder sludge and small stones. Negative sonographic Murphy's sign. No gallbladder wall thickening or pericholecystic fluid. Common bile duct: Diameter: 5.5 mm Liver: Diffusely increased echogenicity. No focal lesion identified. IVC: Poorly visualized. Pancreas: Poorly visualized. Spleen: Size and appearance within normal limits. Right Kidney: Length: 11.3 cm. Echogenicity within normal limits. No mass or hydronephrosis visualized. Left Kidney: Length: 11.4 cm. Echogenicity within normal limits. No mass or  hydronephrosis visualized. Abdominal aorta: No aneurysm visualized. Other findings: Small volume of peritoneal ascites, predominantly perihepatic. IMPRESSION: 1. Hepatic steatosis. 2. Gallbladder sludge and small stones. No secondary signs of acute cholecystitis. 3. Small volume of peritoneal ascites, predominantly perihepatic. Electronically Signed   By: Kristine Garbe M.D.   On: 12/23/2016 19:22   Ct Abdomen Pelvis W Contrast  Result Date: 12/23/2016 CLINICAL DATA:  Jaundice for 1-1/2 months, ascites, RIGHT upper quadrant pain and weight loss. Intermittent chest pressure with bloating sensation for 3 weeks. Abnormal liver function tests, leukocytosis no intrahepatic biliary dilatation. Patent hepatopetal portal vein. EXAM: CT ABDOMEN AND PELVIS WITH CONTRAST TECHNIQUE: Multidetector CT imaging of the abdomen and pelvis was performed using the standard protocol following bolus administration of intravenous contrast.  CONTRAST:  53mL ISOVUE-300 IOPAMIDOL (ISOVUE-300) INJECTION 61% COMPARISON:  None. FINDINGS: LOWER CHEST: Small bilateral pleural effusions. Heart size is normal. No pericardial effusion. HEPATOBILIARY: The liver is at least 25 cm in cranial caudad dimension and diffusely, heterogeneously hypodense. Edema at the porta hepatis. Normal gallbladder. PANCREAS: Irregularity 13 x 19 mm hypodensity within or above the pancreatic head without ductal dilatation, pseudocyst or calcifications. SPLEEN: Normal. ADRENALS/URINARY TRACT: Kidneys are orthotopic, demonstrating symmetric enhancement. No nephrolithiasis, hydronephrosis or solid renal masses. The unopacified ureters are normal in course and caliber. Delayed imaging through the kidneys demonstrates symmetric prompt contrast excretion within the proximal urinary collecting system. Urinary bladder is partially distended and unremarkable. Normal adrenal glands. STOMACH/BOWEL: Small hiatal hernia. The stomach, small and large bowel are normal in course and caliber without inflammatory changes. Mild colonic diverticulosis. VASCULAR/LYMPHATIC: Aortoiliac vessels are normal in course and caliber, trace calcific atherosclerosis. No lymphadenopathy by CT size criteria. REPRODUCTIVE: Normal. OTHER: Large volume ascites, without flattening in the inferior vena cava. No intraperitoneal free air. Mild mesenteric edema without omental or mesenteric mass. MUSCULOSKELETAL: Mild anasarca. IMPRESSION: Severe hypodense hepatomegaly concerning for acute hepatitis. Large volume ascites. No biliary obstruction. Focal hypodensity within or above the pancreatic head, difficult to further characterize due to extensive edema at the porta hepatis. Recommend close attention on follow-up imaging. Small pleural effusions. Electronically Signed   By: Elon Alas M.D.   On: 12/23/2016 19:25     Scheduled Meds: . enoxaparin (LOVENOX) injection  40 mg Subcutaneous QHS  . feeding supplement  1  Container Oral TID BM  . feeding supplement (ENSURE ENLIVE)  237 mL Oral TID BM  . folic acid  1 mg Oral Daily  . multivitamin with minerals  1 tablet Oral Daily  . pantoprazole  40 mg Oral Daily  . phytonadione  10 mg Oral Daily  . prednisoLONE  40 mg Oral Daily  . sodium chloride flush  3 mL Intravenous Q12H  . thiamine  100 mg Oral Daily   Or  . thiamine  100 mg Intravenous Daily   Continuous Infusions: . sodium chloride 75 mL/hr at 12/23/16 2143  . albumin human    . cefTRIAXone (ROCEPHIN)  IV Stopped (12/24/16 0025)     LOS: 1 day   Time Spent in minutes   30 minutes  Sheleen Conchas D.O. on 12/24/2016 at 2:06 PM  Between 7am to 7pm - Pager - [phone]  After 7pm go to [email or web address] - password [email or web address]  And look for the night coverage person covering for me after hours  [facility] Office  [phone]

## 2016-12-25 ENCOUNTER — Inpatient Hospital Stay (HOSPITAL_COMMUNITY): Payer: Managed Care, Other (non HMO) | Admitting: Certified Registered Nurse Anesthetist

## 2016-12-25 ENCOUNTER — Encounter (HOSPITAL_COMMUNITY)
Admission: EM | Disposition: A | Discharge status: Home / Self Care | Payer: Self-pay | Source: Home / Self Care | Attending: Internal Medicine

## 2016-12-25 ENCOUNTER — Encounter (HOSPITAL_COMMUNITY): Payer: Self-pay

## 2016-12-25 DIAGNOSIS — F1020 Alcohol dependence, uncomplicated: Secondary | ICD-10-CM

## 2016-12-25 DIAGNOSIS — K920 Hematemesis: Secondary | ICD-10-CM

## 2016-12-25 DIAGNOSIS — K7031 Alcoholic cirrhosis of liver with ascites: Secondary | ICD-10-CM

## 2016-12-25 DIAGNOSIS — K209 Esophagitis, unspecified without bleeding: Secondary | ICD-10-CM

## 2016-12-25 HISTORY — PX: ESOPHAGOGASTRODUODENOSCOPY: SHX5428

## 2016-12-25 LAB — CBC
HCT: 23.1 % — ABNORMAL LOW (ref 36.0–46.0)
Hemoglobin: 8.1 g/dL — ABNORMAL LOW (ref 12.0–15.0)
MCH: 38 pg — AB (ref 26.0–34.0)
MCHC: 35.1 g/dL (ref 30.0–36.0)
MCV: 108.5 fL — AB (ref 78.0–100.0)
PLATELETS: 291 10*3/uL (ref 150–400)
RBC: 2.13 MIL/uL — ABNORMAL LOW (ref 3.87–5.11)
RDW: 15.4 % (ref 11.5–15.5)
WBC: 19.5 10*3/uL — AB (ref 4.0–10.5)

## 2016-12-25 LAB — COMPREHENSIVE METABOLIC PANEL
ALK PHOS: 217 U/L — AB (ref 38–126)
ALT: 34 U/L (ref 14–54)
AST: 91 U/L — AB (ref 15–41)
Albumin: 1.9 g/dL — ABNORMAL LOW (ref 3.5–5.0)
Anion gap: 9 (ref 5–15)
BUN: 10 mg/dL (ref 6–20)
CALCIUM: 7.1 mg/dL — AB (ref 8.9–10.3)
CHLORIDE: 94 mmol/L — AB (ref 101–111)
CO2: 23 mmol/L (ref 22–32)
CREATININE: 0.65 mg/dL (ref 0.44–1.00)
GFR calc Af Amer: >60 mL/min (ref >60)
Glucose, Bld: 129 mg/dL — ABNORMAL HIGH (ref 65–99)
Potassium: 2.7 mmol/L — CL (ref 3.5–5.1)
Sodium: 126 mmol/L — ABNORMAL LOW (ref 135–145)
Total Bilirubin: 15.1 mg/dL — ABNORMAL HIGH (ref 0.3–1.2)
Total Protein: 5 g/dL — ABNORMAL LOW (ref 6.5–8.1)

## 2016-12-25 LAB — C DIFFICILE QUICK SCREEN W PCR REFLEX
C DIFFICILE (CDIFF) TOXIN: NEGATIVE
C Diff antigen: NEGATIVE
C Diff interpretation: NOT DETECTED

## 2016-12-25 LAB — URINE CULTURE

## 2016-12-25 LAB — VITAMIN B12: Vitamin B-12: 659 pg/mL (ref 180–914)

## 2016-12-25 LAB — HEPATITIS PANEL, ACUTE
HCV Ab: <0.1 s/co ratio (ref 0.0–0.9)
HEP A IGM: NEGATIVE
HEP B C IGM: NEGATIVE
Hepatitis B Surface Ag: NEGATIVE

## 2016-12-25 LAB — PROTIME-INR
INR: 1.56
Prothrombin Time: 18.8 seconds — ABNORMAL HIGH (ref 11.4–15.2)

## 2016-12-25 LAB — MAGNESIUM: MAGNESIUM: 2.3 mg/dL (ref 1.7–2.4)

## 2016-12-25 SURGERY — EGD (ESOPHAGOGASTRODUODENOSCOPY)
Anesthesia: Monitor Anesthesia Care

## 2016-12-25 MED ORDER — FLUCONAZOLE IN SODIUM CHLORIDE 400-0.9 MG/200ML-% IV SOLN
400.0000 mg | Freq: Once | INTRAVENOUS | Status: AC
  Administered 2016-12-25: 400 mg via INTRAVENOUS
  Filled 2016-12-25: qty 200

## 2016-12-25 MED ORDER — LACTATED RINGERS IV SOLN
INTRAVENOUS | Status: DC
  Administered 2016-12-25: 1000 mL via INTRAVENOUS

## 2016-12-25 MED ORDER — PANTOPRAZOLE SODIUM 40 MG PO TBEC
40.0000 mg | DELAYED_RELEASE_TABLET | Freq: Two times a day (BID) | ORAL | Status: DC
  Administered 2016-12-25 – 2016-12-28 (×8): 40 mg via ORAL
  Filled 2016-12-25 (×7): qty 1

## 2016-12-25 MED ORDER — FLUCONAZOLE IN SODIUM CHLORIDE 200-0.9 MG/100ML-% IV SOLN
200.0000 mg | INTRAVENOUS | Status: DC
  Filled 2016-12-25: qty 100

## 2016-12-25 MED ORDER — SODIUM CHLORIDE 0.9 % IV SOLN: INTRAVENOUS | Status: DC

## 2016-12-25 MED ORDER — POTASSIUM CHLORIDE 10 MEQ/100ML IV SOLN
10.0000 meq | INTRAVENOUS | Status: DC
  Administered 2016-12-25: 10 meq via INTRAVENOUS
  Filled 2016-12-25 (×5): qty 100

## 2016-12-25 MED ORDER — ONDANSETRON HCL 4 MG/2ML IJ SOLN
INTRAMUSCULAR | Status: AC
  Filled 2016-12-25: qty 2

## 2016-12-25 MED ORDER — POTASSIUM CHLORIDE IN NACL 20-0.9 MEQ/L-% IV SOLN
INTRAVENOUS | Status: AC
  Administered 2016-12-25: 04:00:00 via INTRAVENOUS
  Filled 2016-12-25: qty 1000

## 2016-12-25 MED ORDER — LACTATED RINGERS IV SOLN
INTRAVENOUS | Status: DC | PRN
  Administered 2016-12-25: 08:00:00 via INTRAVENOUS

## 2016-12-25 MED ORDER — SODIUM CHLORIDE 0.9 % IV BOLUS (SEPSIS)
500.0000 mL | Freq: Once | INTRAVENOUS | Status: AC
  Administered 2016-12-25: 500 mL via INTRAVENOUS

## 2016-12-25 MED ORDER — PROPOFOL 10 MG/ML IV BOLUS
INTRAVENOUS | Status: DC | PRN
  Administered 2016-12-25: 20 mg via INTRAVENOUS
  Administered 2016-12-25 (×2): 30 mg via INTRAVENOUS

## 2016-12-25 MED ORDER — POTASSIUM CHLORIDE 20 MEQ/15ML (10%) PO SOLN
40.0000 meq | Freq: Two times a day (BID) | ORAL | Status: AC
  Administered 2016-12-25 (×2): 40 meq via ORAL
  Filled 2016-12-25 (×2): qty 30

## 2016-12-25 MED ORDER — POTASSIUM CHLORIDE CRYS ER 20 MEQ PO TBCR: 40.0000 meq | EXTENDED_RELEASE_TABLET | Freq: Once | ORAL | Status: DC

## 2016-12-25 MED ORDER — PROPOFOL 10 MG/ML IV BOLUS
INTRAVENOUS | Status: AC
  Filled 2016-12-25: qty 40

## 2016-12-25 MED ORDER — PROPOFOL 500 MG/50ML IV EMUL
INTRAVENOUS | Status: DC | PRN
  Administered 2016-12-25: 75 ug/kg/min via INTRAVENOUS

## 2016-12-25 NOTE — Transfer of Care (Signed)
Immediate Anesthesia Transfer of Care Note  Patient: Mercedes Mitchell  Procedure(s) Performed: Procedure(s): ESOPHAGOGASTRODUODENOSCOPY (EGD) (N/A)  Patient Location: PACU  Anesthesia Type:MAC  Level of Consciousness:  sedated, patient cooperative and responds to stimulation  Airway & Oxygen Therapy:Patient Spontanous Breathing and Patient connected to face mask oxgen  Post-op Assessment:  Report given to PACU RN and Post -op Vital signs reviewed and stable  Post vital signs:  Reviewed and stable  Last Vitals:  Vitals:   12/25/16 0447 12/25/16 0721  BP: 95/65 (!) 87/60  Pulse: 100 94  Resp: 18 14  Temp: 36.6 C 81.4 C    Complications: No apparent anesthesia complications

## 2016-12-25 NOTE — Progress Notes (Signed)
K+ 2.7, reported to Anesthesiologist and Dr. Hilarie Fredrickson.  Will go forward with EGD.

## 2016-12-25 NOTE — Progress Notes (Signed)
Notified Dr. Ree Kida lab called with critical potassium 2.7 for patient.  Patient off unit in Endoscopy.  Physician putting in orders for potassium when patient returns to unit.

## 2016-12-25 NOTE — Progress Notes (Signed)
Notified on-call Dr. Myna Hidalgo about pt's low BP 80/60 manualyl. Pt is asymptomatic. 500 cc NS bolus ordered and adjustments made to continuous IVF. Will continue to monitor.

## 2016-12-25 NOTE — Progress Notes (Signed)
Lab called with Critical potassium 2.7 for patient.  Patient off unit in Endoscopy.  Notified Lattie Haw, RN and she reports she will inform Dr. Hilarie Fredrickson.

## 2016-12-25 NOTE — Op Note (Signed)
[facility] Patient Name: Mercedes Mitchell Procedure Date: 12/25/2016 MRN: [id number] Attending MD: Jerene Bears , MD Date of Birth: [birth year] CSN: [id number] Age: 56 Admit Type: Inpatient Procedure:                Upper GI endoscopy Indications:              Dysphagia, Coffee-ground emesis, Heme positive                            stool, acute alcoholic hepatitis with decompensated                            liver disease Providers:                Lajuan Lines. Hilarie Fredrickson, MD, Burtis Junes, RN, Lillie Fragmin,                            RN, Corliss Parish, Technician Referring MD:             [facility] Medicines:                Monitored Anesthesia Care Complications:            No immediate complications. Estimated Blood Loss:     Estimated blood loss was minimal. Procedure:                Pre-Anesthesia Assessment:                           - Prior to the procedure, a History and Physical                            was performed, and patient medications and                            allergies were reviewed. The patient's tolerance of                            previous anesthesia was also reviewed. The risks                            and benefits of the procedure and the sedation                            options and risks were discussed with the patient.                            All questions were answered, and informed consent                            was obtained. Prior Anticoagulants: The patient has                            taken no previous anticoagulant or antiplatelet  agents. ASA Grade Assessment: III - A patient with                            severe systemic disease. After reviewing the risks                            and benefits, the patient was deemed in                            satisfactory condition to undergo the procedure.                           After obtaining informed consent, the endoscope was               passed under direct vision. Throughout the                            procedure, the patient's blood pressure, pulse, and                            oxygen saturations were monitored continuously. The                            Endoscope was introduced through the mouth, and                            advanced to the second part of duodenum. The upper                            GI endoscopy was accomplished without difficulty.                            The patient tolerated the procedure well. Scope In: Scope Out: Findings:      LA Grade D (one or more mucosal breaks involving at least 75% of       esophageal circumference) esophagitis with bleeding was found in the mid       and distal esophagus. This was characterized by ulcerations with contact       oozing, particularly in the distal esophagus and GE junction. there was       mucosal sloughing. There was suspicion for involvement with Candida       esophagitis the biopsies were not performed due to coagulopathy and       recent heme positive stool. On withdrawing the scope there was a       superficial mucosal rent at the upper esophageal sphincter suggesting a       possible occult ring in this location.      A 4 cm hiatal hernia was present.      Mild portal hypertensive gastropathy was found in the cardia, in the       gastric fundus and in the gastric body. No blood in the stomach      The examined duodenum was normal. No blood in the duodenum Impression:               - LA Grade D reflux, likely candidiasis, acute and  erosive esophagitis.                           - 4 cm hiatal hernia.                           - Mild non-bleeding portal hypertensive gastropathy.                           - Normal examined duodenum.                           - No evidence of active or recent bleeding in the                            upper GI tract though ulcerative esophagitis would                             explain recent coffee-ground emesis and perhaps                            heme positive stool.                           - No evidence of gastric or esophageal varices. Moderate Sedation:      N/A Recommendation:           - Return patient to hospital ward for ongoing care.                           - Resume clear liquid diet which can be advanced as                            tolerated goal of low sodium diet                           - Continue present medications.                           - BID PPI.                           - Empiric therapy for Candida esophagitis                           - Repeat EGD in about 12 weeks to ensure healing                           - Reflux precautions                           - No alcohol                           - Outpatient colonoscopy unless ongoing blood loss  while here. Procedure Code(s):        --- Professional ---                           [phone], Esophagogastroduodenoscopy, flexible,                            transoral; diagnostic, including collection of                            specimen(s) by brushing or washing, when performed                            (separate procedure) Diagnosis Code(s):        --- Professional ---                           K21.0, Gastro-esophageal reflux disease with                            esophagitis                           B37.81, Candidal esophagitis                           K20.8, Other esophagitis                           K44.9, Diaphragmatic hernia without obstruction or                            gangrene                           K76.6, Portal hypertension                           K31.89, Other diseases of stomach and duodenum                           R13.10, Dysphagia, unspecified                           K92.0, Hematemesis                           R19.5, Other fecal abnormalities CPT copyright 2016 [employer]. All rights reserved. The  codes documented in this report are preliminary and upon coder review may  be revised to meet current compliance requirements. Jerene Bears, MD 12/25/2016 8:41:36 AM This report has been signed electronically. Number of Addenda: 0

## 2016-12-25 NOTE — Progress Notes (Signed)
PROGRESS NOTE    Mercedes Mitchell  GYJ:[id number] DOB: 03/21/1961 DOA: 12/23/2016 PCP: No primary care provider on file.   Chief Complaint  Patient presents with  . Abdominal Pain    Brief Narrative:  HPI On 12/23/2016 by Dr. Jennette Kettle Mercedes Mitchell is a 56 y.o. female with medical history significant of ongoing EtOH abuse.  After discussion with patient and husband, I suspect patient is somewhat under-estimating her EtOH intake which seems to be in the realm of 2.5 gallons per week per husband.  Both agree that they are concerned that it might be too much. Patient presents to the ED with c/o 1.5 months of Jaundice and now 2-3 weeks of Abd pain and distention with intermittent vomiting during this time.  12 lb weight loss over past 6 months.  No UOP today and only one void yesterday.  Last BM this AM.  Last drink was last night. Saw PCP today who referred her to the ED.  Assessment & Plan   Acute alcoholic hepatitis with elevated bilirubin and transaminases -CT Abdomen and pelvis showed severe hypodense hepatomegaly concerning for acute hepatitis. Large-volume ascites.  -[place] abd: Hepatic steatosis, gallbladder sludge and small stones. No signs of acute cholecystitis. Small volume of peritoneal ascites -Gastroenterology consulted and appreciated -Hepatitis panel negative, HIV nonreactive -Continue PPI BID -bilirubin and LFTs improving -Continue to monitor CMP -Prednisolone added by GI- continue for 28 days -Continue ceftriaxone -S/p EGD: LA grade D reflux, likely candidiasis, acute and erosive esophagitis. Mild non bleeding portal hypertensive gastropathy. 4cm hiatal hernia. No evidence of active or recently bleeding in upper GI tract though ulcerative esophagitis would explain coffee ground emesis and heme + stool. No evidence of gastric or esophageal varices -Recommendations: continue PPI BID, repeat EGD in 12 week, empiric therapy for candida esophagitis, advance diet as  tolerated -Cdiff negative  Ascites  -Secondary to the above -US paracentesis yielded 2.6L of bright yellow fluid -labs sent, SAAG ? As fluid albumin <1  (low gradient) -Culture shows no growth to date  Alcohol abuse -Drinks approximately 2/3 of 1/5th bottle of vodka daily -Alcohol level <5 -Currently not in withdrawal -Continue CIWA protocol, multivitamin, folic acid, and thiamine  Macrocytic anemia -Unknown baseline hemoglobin however 2011 hemoglobin was 14.5. -Currently hemoglobin 9.4 -Continue to monitor CBC Severe malnutrition -Nutrition consulted -Continue feeding supplements  Acute kidney injury -Creatinine upon admsision 1.3, currently creatinine 0.65 -Continue to monitor BMP  Hyponatremia -Sodium improving -Continue IV fluids, monitor BMP  Hypokalemia -Replaced, continue to monitor BMP  Hypomagnesemia  -Resolved, continue to monitor  DVT Prophylaxis  Lovenox  Code Status: Full  Family Communication: None at bedside  Disposition Plan: Admitted  Consultants Gastroenterology  Procedures  Abdominal US  Antibiotics   Anti-infectives    Start     Dose/Rate Route Frequency Ordered Stop   12/26/16 1000  fluconazole (DIFLUCAN) IVPB 200 mg     200 mg 100 mL/hr over 60 Minutes Intravenous Every 24 hours 12/25/16 0843 01/08/17 0959   12/25/16 1000  fluconazole (DIFLUCAN) IVPB 400 mg     400 mg 100 mL/hr over 120 Minutes Intravenous  Once 12/25/16 0843 12/25/16 1147   12/23/16 2245  cefTRIAXone (ROCEPHIN) 2 g in dextrose 5 % 50 mL IVPB     2 g 100 mL/hr over 30 Minutes Intravenous Daily at bedtime 12/23/16 2227        Subjective:   [name] seen and examined today.  Patient denies chest pain, shortness of breath,  or current nausea or vomiting. Has had diarrhea. Feels her abdomen is less tense. Feels hungry today.   Objective:   Vitals:   12/25/16 0842 12/25/16 0850 12/25/16 0900 12/25/16 0929  BP: (!) 81/54 (!) 85/53 (!) 83/57 (!) 88/62  Pulse:  85 84 80 80  Resp: 12 13 14 16   Temp: 97.9 F (36.6 C)   98 F (36.7 C)  TempSrc: Oral   Oral  SpO2: 100% 96% 100% 100%  Weight:      Height:        Intake/Output Summary (Last 24 hours) at 12/25/16 1355 Last data filed at 12/25/16 0839  Gross per 24 hour  Intake          1686.25 ml  Output                0 ml  Net          1686.25 ml   Filed Weights   12/23/16 1410 12/24/16 1318 12/25/16 0721  Weight: 51.3 kg (113 lb) 54 kg (119 lb) 54 kg (119 lb)    Exam  General: Well developed, Cachectic, NAD  HEENT: NCAT, Icteric sclera, mucous membranes moist.   Cardiovascular: S1 S2 auscultated, RRR, no murmurs  Respiratory: Clear to auscultation bilaterally  Abdomen: Soft, nontender, +distended, + bowel sounds, +hepatomegaly  Extremities: warm dry without cyanosis clubbing. LE edema (mildly improving)  Neuro: AAOx3, nonfocal  Skin: Jaundice  Psych: Normal affect and demeanor, pleasant    Data Reviewed: I have personally reviewed following labs and imaging studies  CBC:  Recent Labs Lab 12/23/16 1420 12/24/16 0656 12/25/16 0654  WBC 26.8* 24.8* 19.5*  HGB 11.1* 9.4* 8.1*  HCT 31.4* 26.5* 23.1*  MCV 108.3* 109.5* 108.5*  PLT 327 285 098   Basic Metabolic Panel:  Recent Labs Lab 12/23/16 1420 12/24/16 0656 12/25/16 0654  NA 119* 120* 126*  K 2.9* 2.8* 2.7*  CL 79* 84* 94*  CO2 24 22 23   GLUCOSE 126* 108* 129*  BUN 8 10 10   CREATININE 1.30* 1.01* 0.65  CALCIUM 7.8* 7.0* 7.1*  MG 1.5*  --  2.3   GFR: Estimated Creatinine Clearance: 68.5 mL/min (by C-G formula based on SCr of 0.65 mg/dL). Liver Function Tests:  Recent Labs Lab 12/23/16 1420 12/24/16 0656 12/25/16 0654  AST 137* 113* 91*  ALT 46 37 34  ALKPHOS 304* 234* 217*  BILITOT 21.6* 17.8* 15.1*  PROT 6.1* 5.1* 5.0*  ALBUMIN 2.3* 1.8* 1.9*    Recent Labs Lab 12/23/16 1420  LIPASE 23   No results for input(s): AMMONIA in the last 168 hours. Coagulation Profile:  Recent  Labs Lab 12/23/16 1420 12/24/16 0656 12/25/16 0654  INR 1.35 1.61 1.56   Cardiac Enzymes: No results for input(s): CKTOTAL, CKMB, CKMBINDEX, TROPONINI in the last 168 hours. BNP (last 3 results) No results for input(s): PROBNP in the last 8760 hours. HbA1C: No results for input(s): HGBA1C in the last 72 hours. CBG: No results for input(s): GLUCAP in the last 168 hours. Lipid Profile: No results for input(s): CHOL, HDL, LDLCALC, TRIG, CHOLHDL, LDLDIRECT in the last 72 hours. Thyroid Function Tests: No results for input(s): TSH, T4TOTAL, FREET4, T3FREE, THYROIDAB in the last 72 hours. Anemia Panel:  Recent Labs  12/25/16 0654  VITAMINB12 659   Urine analysis:    Component Value Date/Time   COLORURINE AMBER (A) 12/23/2016 1413   APPEARANCEUR HAZY (A) 12/23/2016 1413   LABSPEC 1.009 12/23/2016 1413   PHURINE 6.0 12/23/2016 1413  GLUCOSEU NEGATIVE 12/23/2016 1413   HGBUR SMALL (A) 12/23/2016 1413   HGBUR large 04/13/2010 1228   BILIRUBINUR MODERATE (A) 12/23/2016 1413   KETONESUR NEGATIVE 12/23/2016 1413   PROTEINUR NEGATIVE 12/23/2016 1413   UROBILINOGEN 0.2 04/13/2010 1228   NITRITE NEGATIVE 12/23/2016 1413   LEUKOCYTESUR NEGATIVE 12/23/2016 1413   Sepsis Labs: @LABRCNTIP (procalcitonin:4,lacticidven:4)  ) Recent Results (from the past 240 hour(s))  Urine culture     Status: Abnormal   Collection Time: 12/23/16  3:48 PM  Result Value Ref Range Status   Specimen Description URINE, CLEAN CATCH  Final   Special Requests NONE  Final   Culture MULTIPLE SPECIES PRESENT, SUGGEST RECOLLECTION (A)  Final   Report Status 12/25/2016 FINAL  Final  Culture, blood (routine x 2)     Status: None (Preliminary result)   Collection Time: 12/23/16  4:30 PM  Result Value Ref Range Status   Specimen Description BLOOD LEFT FOREARM  Final   Special Requests   Final    BOTTLES DRAWN AEROBIC AND ANAEROBIC Blood Culture adequate volume   Culture   Final    NO GROWTH 2 DAYS Performed  at [facility], [place] [address] [place], [place] [address]    Report Status PENDING  Incomplete  Culture, body fluid-bottle     Status: None (Preliminary result)   Collection Time: 12/24/16  2:30 PM  Result Value Ref Range Status   Specimen Description PERITONEAL  Final   Special Requests BOTTLES DRAWN AEROBIC AND ANAEROBIC 10CC  Final   Culture   Final    NO GROWTH < 24 HOURS Performed at [facility], [place] [address], [place], [place] [address]    Report Status PENDING  Incomplete  Gram stain     Status: None   Collection Time: 12/24/16  2:30 PM  Result Value Ref Range Status   Specimen Description PERITONEAL  Final   Special Requests NONE  Final   Gram Stain   Final    FEW WBC PRESENT, PREDOMINANTLY MONONUCLEAR NO ORGANISMS SEEN Performed at [facility], [address], [place], [place] [address]    Report Status 12/24/2016 FINAL  Final  C difficile quick scan w PCR reflex     Status: None   Collection Time: 12/25/16 10:33 AM  Result Value Ref Range Status   C Diff antigen NEGATIVE NEGATIVE Final   C Diff toxin NEGATIVE NEGATIVE Final   C Diff interpretation No C. difficile detected.  Final      Radiology Studies: US Abdomen Complete  Result Date: 12/23/2016 CLINICAL DATA:  56 y/o F; jaundice, hyperbilirubinemia, question ascites. EXAM: ABDOMEN ULTRASOUND COMPLETE COMPARISON:  None. FINDINGS: Gallbladder: Gallbladder sludge and small stones. Negative sonographic Murphy's sign. No gallbladder wall thickening or pericholecystic fluid. Common bile duct: Diameter: 5.5 mm Liver: Diffusely increased echogenicity. No focal lesion identified. IVC: Poorly visualized. Pancreas: Poorly visualized. Spleen: Size and appearance within normal limits. Right Kidney: Length: 11.3 cm. Echogenicity within normal limits. No mass or hydronephrosis visualized. Left Kidney: Length: 11.4 cm. Echogenicity within normal limits. No mass or hydronephrosis visualized. Abdominal aorta: No  aneurysm visualized. Other findings: Small volume of peritoneal ascites, predominantly perihepatic. IMPRESSION: 1. Hepatic steatosis. 2. Gallbladder sludge and small stones. No secondary signs of acute cholecystitis. 3. Small volume of peritoneal ascites, predominantly perihepatic. Electronically Signed   By: Kristine Garbe M.D.   On: 12/23/2016 19:22   Ct Abdomen Pelvis W Contrast  Result Date: 12/23/2016 CLINICAL DATA:  Jaundice  for 1-1/2 months, ascites, RIGHT upper quadrant pain and weight loss. Intermittent chest pressure with bloating sensation for 3 weeks. Abnormal liver function tests, leukocytosis no intrahepatic biliary dilatation. Patent hepatopetal portal vein. EXAM: CT ABDOMEN AND PELVIS WITH CONTRAST TECHNIQUE: Multidetector CT imaging of the abdomen and pelvis was performed using the standard protocol following bolus administration of intravenous contrast. CONTRAST:  31mL ISOVUE-300 IOPAMIDOL (ISOVUE-300) INJECTION 61% COMPARISON:  None. FINDINGS: LOWER CHEST: Small bilateral pleural effusions. Heart size is normal. No pericardial effusion. HEPATOBILIARY: The liver is at least 25 cm in cranial caudad dimension and diffusely, heterogeneously hypodense. Edema at the porta hepatis. Normal gallbladder. PANCREAS: Irregularity 13 x 19 mm hypodensity within or above the pancreatic head without ductal dilatation, pseudocyst or calcifications. SPLEEN: Normal. ADRENALS/URINARY TRACT: Kidneys are orthotopic, demonstrating symmetric enhancement. No nephrolithiasis, hydronephrosis or solid renal masses. The unopacified ureters are normal in course and caliber. Delayed imaging through the kidneys demonstrates symmetric prompt contrast excretion within the proximal urinary collecting system. Urinary bladder is partially distended and unremarkable. Normal adrenal glands. STOMACH/BOWEL: Small hiatal hernia. The stomach, small and large bowel are normal in course and caliber without inflammatory changes.  Mild colonic diverticulosis. VASCULAR/LYMPHATIC: Aortoiliac vessels are normal in course and caliber, trace calcific atherosclerosis. No lymphadenopathy by CT size criteria. REPRODUCTIVE: Normal. OTHER: Large volume ascites, without flattening in the inferior vena cava. No intraperitoneal free air. Mild mesenteric edema without omental or mesenteric mass. MUSCULOSKELETAL: Mild anasarca. IMPRESSION: Severe hypodense hepatomegaly concerning for acute hepatitis. Large volume ascites. No biliary obstruction. Focal hypodensity within or above the pancreatic head, difficult to further characterize due to extensive edema at the porta hepatis. Recommend close attention on follow-up imaging. Small pleural effusions. Electronically Signed   By: Elon Alas M.D.   On: 12/23/2016 19:25   US Paracentesis  Result Date: 12/24/2016 INDICATION: Patient with ascites. Request is made for diagnostic and therapeutic paracentesis. EXAM: ULTRASOUND GUIDED DIAGNOSTIC AND THERAPEUTIC PARACENTESIS MEDICATIONS: 10 mL 1% lidocaine COMPLICATIONS: None immediate. PROCEDURE: Informed written consent was obtained from the patient after a discussion of the risks, benefits and alternatives to treatment. A timeout was performed prior to the initiation of the procedure. Initial ultrasound scanning demonstrates a large amount of ascites within the right lateral abdomen. The right lateral abdomen was prepped and draped in the usual sterile fashion. 1% lidocaine was used for local anesthesia. Following this, a 19 gauge, 7-cm, Yueh catheter was introduced. An ultrasound image was saved for documentation purposes. The paracentesis was performed. The catheter was removed and a dressing was applied. The patient tolerated the procedure well without immediate post procedural complication. FINDINGS: A total of approximately 2.6 liters of bright yellow fluid was removed. Samples were sent to the laboratory as requested by the clinical team. IMPRESSION:  Successful ultrasound-guided paracentesis yielding 2.6 liters of peritoneal fluid. Read by:  Brynda Greathouse PA-C Electronically Signed   By: Jerilynn Mages.  Shick M.D.   On: 12/24/2016 15:46     Scheduled Meds: . enoxaparin (LOVENOX) injection  40 mg Subcutaneous QHS  . feeding supplement  1 Container Oral TID BM  . feeding supplement (ENSURE ENLIVE)  237 mL Oral TID BM  . folic acid  1 mg Oral Daily  . multivitamin with minerals  1 tablet Oral Daily  . pantoprazole  40 mg Oral BID  . phytonadione  10 mg Oral Daily  . potassium chloride  40 mEq Oral BID  . prednisoLONE  40 mg Oral Daily  . sodium chloride flush  3  mL Intravenous Q12H  . thiamine  100 mg Oral Daily   Or  . thiamine  100 mg Intravenous Daily   Continuous Infusions: . cefTRIAXone (ROCEPHIN)  IV Stopped (12/25/16 0056)  . [START ON 12/26/2016] fluconazole (DIFLUCAN) IV       LOS: 2 days   Time Spent in minutes   30 minutes  Omarrion Carmer D.O. on 12/25/2016 at 1:55 PM  Between 7am to 7pm - Pager - [phone]  After 7pm go to [email or web address] - password [email or web address]  And look for the night coverage person covering for me after hours  [facility] Office  [phone]

## 2016-12-25 NOTE — Anesthesia Preprocedure Evaluation (Signed)
Anesthesia Evaluation  Patient identified by MRN, date of birth, ID band Patient awake    Reviewed: Allergy & Precautions, H&P , NPO status , Patient's Chart, lab work & pertinent test results  Airway Mallampati: II   Neck ROM: full    Dental   Pulmonary Current Smoker,    breath sounds clear to auscultation       Cardiovascular negative cardio ROS   Rhythm:regular Rate:Normal     Neuro/Psych PSYCHIATRIC DISORDERS Anxiety    GI/Hepatic (+)     substance abuse  alcohol use, Hepatitis -Ascites GI bleed   Endo/Other    Renal/GU      Musculoskeletal   Abdominal   Peds  Hematology  (+) anemia ,   Anesthesia Other Findings   Reproductive/Obstetrics                             Anesthesia Physical Anesthesia Plan  ASA: III  Anesthesia Plan: MAC   Post-op Pain Management:    Induction: Intravenous  Airway Management Planned: Nasal Cannula  Additional Equipment:   Intra-op Plan:   Post-operative Plan:   Informed Consent: I have reviewed the patients History and Physical, chart, labs and discussed the procedure including the risks, benefits and alternatives for the proposed anesthesia with the patient or authorized representative who has indicated his/her understanding and acceptance.     Plan Discussed with: CRNA, Anesthesiologist and Surgeon  Anesthesia Plan Comments:         Anesthesia Quick Evaluation

## 2016-12-26 DIAGNOSIS — R188 Other ascites: Secondary | ICD-10-CM

## 2016-12-26 LAB — COMPREHENSIVE METABOLIC PANEL
ALT: 39 U/L (ref 14–54)
AST: 128 U/L — ABNORMAL HIGH (ref 15–41)
Albumin: 2 g/dL — ABNORMAL LOW (ref 3.5–5.0)
Alkaline Phosphatase: 243 U/L — ABNORMAL HIGH (ref 38–126)
Anion gap: 8 (ref 5–15)
BUN: 9 mg/dL (ref 6–20)
CALCIUM: 7.5 mg/dL — AB (ref 8.9–10.3)
CO2: 23 mmol/L (ref 22–32)
CREATININE: 0.6 mg/dL (ref 0.44–1.00)
Chloride: 99 mmol/L — ABNORMAL LOW (ref 101–111)
Glucose, Bld: 113 mg/dL — ABNORMAL HIGH (ref 65–99)
Potassium: 3.6 mmol/L (ref 3.5–5.1)
SODIUM: 130 mmol/L — AB (ref 135–145)
Total Bilirubin: 14.4 mg/dL — ABNORMAL HIGH (ref 0.3–1.2)
Total Protein: 4.9 g/dL — ABNORMAL LOW (ref 6.5–8.1)

## 2016-12-26 LAB — CBC
HCT: 25 % — ABNORMAL LOW (ref 36.0–46.0)
HEMOGLOBIN: 8.7 g/dL — AB (ref 12.0–15.0)
MCH: 38.5 pg — AB (ref 26.0–34.0)
MCHC: 34.8 g/dL (ref 30.0–36.0)
MCV: 110.6 fL — AB (ref 78.0–100.0)
Platelets: 340 10*3/uL (ref 150–400)
RBC: 2.26 MIL/uL — AB (ref 3.87–5.11)
RDW: 15.8 % — ABNORMAL HIGH (ref 11.5–15.5)
WBC: 22.5 10*3/uL — ABNORMAL HIGH (ref 4.0–10.5)

## 2016-12-26 MED ORDER — DEXTROSE 5 % IV SOLN
2.0000 g | Freq: Every day | INTRAVENOUS | Status: AC
  Administered 2016-12-26 – 2016-12-27 (×2): 2 g via INTRAVENOUS
  Filled 2016-12-26 (×2): qty 2

## 2016-12-26 MED ORDER — PSYLLIUM 95 % PO PACK
1.0000 | PACK | Freq: Every day | ORAL | Status: DC
  Administered 2016-12-26 – 2016-12-28 (×3): 1 via ORAL
  Filled 2016-12-26 (×3): qty 1

## 2016-12-26 MED ORDER — POTASSIUM CHLORIDE 20 MEQ/15ML (10%) PO SOLN
40.0000 meq | Freq: Every day | ORAL | Status: DC
  Administered 2016-12-26 – 2016-12-28 (×3): 40 meq via ORAL
  Filled 2016-12-26 (×3): qty 30

## 2016-12-26 MED ORDER — FLUCONAZOLE 200 MG PO TABS
200.0000 mg | ORAL_TABLET | Freq: Every day | ORAL | Status: DC
  Administered 2016-12-26 – 2016-12-28 (×3): 200 mg via ORAL
  Filled 2016-12-26 (×3): qty 1

## 2016-12-26 MED ORDER — SPIRONOLACTONE 25 MG PO TABS
100.0000 mg | ORAL_TABLET | Freq: Every day | ORAL | Status: DC
  Administered 2016-12-26 – 2016-12-28 (×3): 100 mg via ORAL
  Filled 2016-12-26 (×3): qty 4

## 2016-12-26 MED ORDER — IBUPROFEN 200 MG PO TABS
200.0000 mg | ORAL_TABLET | Freq: Once | ORAL | Status: AC
  Administered 2016-12-26: 200 mg via ORAL
  Filled 2016-12-26: qty 1

## 2016-12-26 MED ORDER — FUROSEMIDE 40 MG PO TABS
40.0000 mg | ORAL_TABLET | Freq: Every day | ORAL | Status: DC
  Administered 2016-12-26 – 2016-12-28 (×3): 40 mg via ORAL
  Filled 2016-12-26 (×3): qty 1

## 2016-12-26 NOTE — Progress Notes (Signed)
Daily Rounding Note  12/26/2016, 8:13 AM  LOS: 3 days   SUBJECTIVE:   Chief complaint: small liquid stools, some fecal incontinence.  Tolerating clears and just now had solid breakfast.  Not taking much po but tolerating.  Feels ok, no pain or dyspnea.  Interrupted sleep.    OBJECTIVE:         Vital signs in last 24 hours:    Temp:  [97.5 F (36.4 C)-98.5 F (36.9 C)] 98.5 F (36.9 C) (05/06 0527) Pulse Rate:  [80-92] 84 (05/06 0527) Resp:  [12-16] 16 (05/05 1358) BP: (81-94)/(53-68) 94/65 (05/06 0527) SpO2:  [96 %-100 %] 98 % (05/06 0527) Last BM Date: 12/25/16 Filed Weights   12/23/16 1410 12/24/16 1318 12/25/16 0721  Weight: 51.3 kg (113 lb) 54 kg (119 lb) 54 kg (119 lb)   General: Jaundiced.  Looks cachectic and unwell.   Heart: RRR Chest: clear bil.  No dyspnea or cough Abdomen: soft, mild protuberance.  Slight tenderness in RUQ over enlarged liver.  Active BS.   Extremities: no CCE.  Thin, muscle wasting of all 4 limbs.   Neuro/Psych:  No asterixis or tremor.   Moving about room without problems.    Intake/Output from previous day: 05/05 0701 - 05/06 0700 In: 300 [I.V.:300] Out: -   Intake/Output this shift: No intake/output data recorded.  Lab Results:  Recent Labs  12/24/16 0656 12/25/16 0654 12/26/16 0525  WBC 24.8* 19.5* 22.5*  HGB 9.4* 8.1* 8.7*  HCT 26.5* 23.1* 25.0*  PLT 285 291 340   BMET  Recent Labs  12/24/16 0656 12/25/16 0654 12/26/16 0525  NA 120* 126* 130*  K 2.8* 2.7* 3.6  CL 84* 94* 99*  CO2 22 23 23   GLUCOSE 108* 129* 113*  BUN 10 10 9   CREATININE 1.01* 0.65 0.60  CALCIUM 7.0* 7.1* 7.5*   LFT  Recent Labs  12/23/16 1420 12/24/16 0656 12/25/16 0654 12/26/16 0525  PROT 6.1* 5.1* 5.0* 4.9*  ALBUMIN 2.3* 1.8* 1.9* 2.0*  AST 137* 113* 91* 128*  ALT 46 37 34 39  ALKPHOS 304* 234* 217* 243*  BILITOT 21.6* 17.8* 15.1* 14.4*  BILIDIR 14.4*  --   --   --     PT/INR  Recent Labs  12/24/16 0656 12/25/16 0654  LABPROT 19.3* 18.8*  INR 1.61 1.56   Hepatitis Panel  Recent Labs  12/23/16 2145  HEPBSAG Negative  HCVAB <0.1  HEPAIGM Negative  HEPBIGM Negative    Studies/Results: US Paracentesis  Result Date: 12/24/2016 INDICATION: Patient with ascites. Request is made for diagnostic and therapeutic paracentesis. EXAM: ULTRASOUND GUIDED DIAGNOSTIC AND THERAPEUTIC PARACENTESIS MEDICATIONS: 10 mL 1% lidocaine COMPLICATIONS: None immediate. PROCEDURE: Informed written consent was obtained from the patient after a discussion of the risks, benefits and alternatives to treatment. A timeout was performed prior to the initiation of the procedure. Initial ultrasound scanning demonstrates a large amount of ascites within the right lateral abdomen. The right lateral abdomen was prepped and draped in the usual sterile fashion. 1% lidocaine was used for local anesthesia. Following this, a 19 gauge, 7-cm, Yueh catheter was introduced. An ultrasound image was saved for documentation purposes. The paracentesis was performed. The catheter was removed and a dressing was applied. The patient tolerated the procedure well without immediate post procedural complication. FINDINGS: A total of approximately 2.6 liters of bright yellow fluid was removed. Samples were sent to the laboratory as requested by the clinical team. IMPRESSION:  Successful ultrasound-guided paracentesis yielding 2.6 liters of peritoneal fluid. Read by:  Brynda Greathouse PA-C Electronically Signed   By: Jerilynn Mages.  Shick M.D.   On: 12/24/2016 15:46   Scheduled Meds: . enoxaparin (LOVENOX) injection  40 mg Subcutaneous QHS  . feeding supplement  1 Container Oral TID BM  . feeding supplement (ENSURE ENLIVE)  237 mL Oral TID BM  . folic acid  1 mg Oral Daily  . multivitamin with minerals  1 tablet Oral Daily  . pantoprazole  40 mg Oral BID  . phytonadione  10 mg Oral Daily  . potassium chloride  40 mEq Oral  Daily  . prednisoLONE  40 mg Oral Daily  . sodium chloride flush  3 mL Intravenous Q12H  . thiamine  100 mg Oral Daily   Or  . thiamine  100 mg Intravenous Daily   Continuous Infusions: . cefTRIAXone (ROCEPHIN)  IV Stopped (12/25/16 2136)  . fluconazole (DIFLUCAN) IV     PRN Meds:.alum & mag hydroxide-simeth, LORazepam **OR** LORazepam, ondansetron (ZOFRAN) IV   ASSESMENT:   *  Alcoholic hepatitis, jaundice.  Overall improved. Day 3 predisolone.  Acute hepatitis serologies negative.  ? Cirrhosis.    *  FOBT + dark stool and emesis. EGD 12/25/16: grade D erosive esophagitis, likely candidial.  HH.  Non-bleeding, mild, portal gastropathy.   Diflucan IV day 2.  Po PPI day 3.     *  Ascites.  No SBP on 2.6 liter tap 5/4.    *  Dysphagia  *  Loose stools, not high volume. C diff negative.   *  Coagulopathy.  Improved.   *  Macrocytic anemia.  On Folic Acid, thiamine.    *  Hyponatremia.   Improved.   *  Leukocytosis, persists.  C diff negative.    PLAN   *   ETOH abstinence.  Rehab program, AA.    *  12 weeks follow up EGD to assure healing and r/o Barretts.  At same time perform screening colonoscopy.   *  Complete 5 days Rocephin for GIB in pt with ascites, possible cirrhosis.   *  Add psyllium.  Switch to po Diflucan.     Azucena Freed  12/26/2016, 8:13 AM Pager: [phone]

## 2016-12-26 NOTE — Progress Notes (Signed)
PROGRESS NOTE    Mercedes Mitchell  CHY:[id number] DOB: 12-01-1960 DOA: 12/23/2016 PCP: No primary care provider on file.   Chief Complaint  Patient presents with  . Abdominal Pain    Brief Narrative:  HPI On 12/23/2016 by Dr. Jennette Kettle Mercedes Mitchell is a 56 y.o. female with medical history significant of ongoing EtOH abuse.  After discussion with patient and husband, I suspect patient is somewhat under-estimating her EtOH intake which seems to be in the realm of 2.5 gallons per week per husband.  Both agree that they are concerned that it might be too much. Patient presents to the ED with c/o 1.5 months of Jaundice and now 2-3 weeks of Abd pain and distention with intermittent vomiting during this time.  12 lb weight loss over past 6 months.  No UOP today and only one void yesterday.  Last BM this AM.  Last drink was last night. Saw PCP today who referred her to the ED.  Interim history GI consulted and s/p EGD  Assessment & Plan   Acute alcoholic hepatitis with elevated bilirubin and transaminases -CT Abdomen and pelvis showed severe hypodense hepatomegaly concerning for acute hepatitis. Large-volume ascites.  -[place] abd: Hepatic steatosis, gallbladder sludge and small stones. No signs of acute cholecystitis. Small volume of peritoneal ascites -Gastroenterology consulted and appreciated -Hepatitis panel negative, HIV nonreactive -Continue PPI BID -bilirubin improving  -Continue to monitor CMP -Prednisolone added by GI- continue for 28 days -Continue ceftriaxone (continue through 12/28/16) -S/p EGD: LA grade D reflux, likely candidiasis, acute and erosive esophagitis. Mild non bleeding portal hypertensive gastropathy. 4cm hiatal hernia. No evidence of active or recently bleeding in upper GI tract though ulcerative esophagitis would explain coffee ground emesis and heme + stool. No evidence of gastric or esophageal varices -Recommendations: continue PPI BID, repeat EGD in 12 week,  empiric therapy for candida esophagitis, advance diet as tolerated -Cdiff negative  Candida Esophagitis -Continue diflucan  Ascites  -Secondary to the above -US paracentesis yielded 2.6L of bright yellow fluid -labs sent, SAAG ? As fluid albumin <1  (low gradient) -Culture shows no growth to date -Blood culture show no growth to date  Alcohol abuse -Drinks approximately 2/3 of 1/5th bottle of vodka daily -Alcohol level <5 -Currently not in withdrawal -Continue CIWA protocol, multivitamin, folic acid, and thiamine  Hematemesis  -FOBT + -GI consulted, EGD as above -Continue diflucan, PPI  Macrocytic anemia -Unknown baseline hemoglobin however 2011 hemoglobin was 14.5. -Currently hemoglobin 8.7 -Continue to monitor CBC  Severe malnutrition -Nutrition consulted -Continue feeding supplements -Tolerating soft diet   Acute kidney injury -Creatinine upon admsision 1.3, currently creatinine 0.60 -Continue to monitor BMP  Hyponatremia -Sodium improving, currently 130 -Continue IV fluids, monitor BMP  Hypokalemia -Resolved currently 3.6, will given additional potassium (goal 4) -continue to monitor BMP  Hypomagnesemia  -Resolved, continue to monitor  DVT Prophylaxis  Lovenox  Code Status: Full  Family Communication: Husband at bedside  Disposition Plan: Admitted. Possible discharge to home on 5/8 once antibiotics completed.   Consultants Gastroenterology  Procedures  Abdominal US EGD  Antibiotics   Anti-infectives    Start     Dose/Rate Route Frequency Ordered Stop   12/26/16 2200  cefTRIAXone (ROCEPHIN) 2 g in dextrose 5 % 50 mL IVPB     2 g 100 mL/hr over 30 Minutes Intravenous Daily at bedtime 12/26/16 0903 12/28/16 2159   12/26/16 1000  fluconazole (DIFLUCAN) IVPB 200 mg  Status:  Discontinued  200 mg 100 mL/hr over 60 Minutes Intravenous Every 24 hours 12/25/16 0843 12/26/16 0902   12/26/16 1000  fluconazole (DIFLUCAN) tablet 200 mg     200 mg  Oral Daily 12/26/16 0902     12/25/16 1000  fluconazole (DIFLUCAN) IVPB 400 mg     400 mg 100 mL/hr over 120 Minutes Intravenous  Once 12/25/16 0843 12/25/16 1147   12/23/16 2245  cefTRIAXone (ROCEPHIN) 2 g in dextrose 5 % 50 mL IVPB  Status:  Discontinued     2 g 100 mL/hr over 30 Minutes Intravenous Daily at bedtime 12/23/16 2227 12/26/16 0258      Subjective:   [name] seen and examined today.  Patient denies chest pain, shortness of breath, or current nausea or vomiting. Was able to eat this morning, feels she ate too much. Feels her abdomen is feeling better.    Objective:   Vitals:   12/25/16 0929 12/25/16 1358 12/25/16 2151 12/26/16 0527  BP: (!) 88/62 (!) 89/59 92/68 94/65   Pulse: 80 90 92 84  Resp: 16 16    Temp: 98 F (36.7 C) 97.5 F (36.4 C) 98.4 F (36.9 C) 98.5 F (36.9 C)  TempSrc: Oral Axillary Oral Oral  SpO2: 100% 100% 99% 98%  Weight:      Height:       No intake or output data in the 24 hours ending 12/26/16 1246 Filed Weights   12/23/16 1410 12/24/16 1318 12/25/16 0721  Weight: 51.3 kg (113 lb) 54 kg (119 lb) 54 kg (119 lb)    Exam (no change)  General: Well developed, Cachectic, NAD  HEENT: NCAT, Icteric sclera, mucous membranes moist.   Cardiovascular: S1 S2 auscultated, RRR, no murmurs  Respiratory: Clear to auscultation bilaterally  Abdomen: Soft, mild RUQ TTP, +distended, + bowel sounds, +hepatomegaly  Extremities: warm dry without cyanosis clubbing. LE edema (mildly improving)  Neuro: AAOx3, nonfocal  Skin: Jaundice  Psych: Normal affect and demeanor, pleasant    Data Reviewed: I have personally reviewed following labs and imaging studies  CBC:  Recent Labs Lab 12/23/16 1420 12/24/16 0656 12/25/16 0654 12/26/16 0525  WBC 26.8* 24.8* 19.5* 22.5*  HGB 11.1* 9.4* 8.1* 8.7*  HCT 31.4* 26.5* 23.1* 25.0*  MCV 108.3* 109.5* 108.5* 110.6*  PLT 327 285 291 527   Basic Metabolic Panel:  Recent Labs Lab 12/23/16 1420  12/24/16 0656 12/25/16 0654 12/26/16 0525  NA 119* 120* 126* 130*  K 2.9* 2.8* 2.7* 3.6  CL 79* 84* 94* 99*  CO2 24 22 23 23   GLUCOSE 126* 108* 129* 113*  BUN 8 10 10 9   CREATININE 1.30* 1.01* 0.65 0.60  CALCIUM 7.8* 7.0* 7.1* 7.5*  MG 1.5*  --  2.3  --    GFR: Estimated Creatinine Clearance: 68.5 mL/min (by C-G formula based on SCr of 0.6 mg/dL). Liver Function Tests:  Recent Labs Lab 12/23/16 1420 12/24/16 0656 12/25/16 0654 12/26/16 0525  AST 137* 113* 91* 128*  ALT 46 37 34 39  ALKPHOS 304* 234* 217* 243*  BILITOT 21.6* 17.8* 15.1* 14.4*  PROT 6.1* 5.1* 5.0* 4.9*  ALBUMIN 2.3* 1.8* 1.9* 2.0*    Recent Labs Lab 12/23/16 1420  LIPASE 23   No results for input(s): AMMONIA in the last 168 hours. Coagulation Profile:  Recent Labs Lab 12/23/16 1420 12/24/16 0656 12/25/16 0654  INR 1.35 1.61 1.56   Cardiac Enzymes: No results for input(s): CKTOTAL, CKMB, CKMBINDEX, TROPONINI in the last 168 hours. BNP (last 3 results)  No results for input(s): PROBNP in the last 8760 hours. HbA1C: No results for input(s): HGBA1C in the last 72 hours. CBG: No results for input(s): GLUCAP in the last 168 hours. Lipid Profile: No results for input(s): CHOL, HDL, LDLCALC, TRIG, CHOLHDL, LDLDIRECT in the last 72 hours. Thyroid Function Tests: No results for input(s): TSH, T4TOTAL, FREET4, T3FREE, THYROIDAB in the last 72 hours. Anemia Panel:  Recent Labs  12/25/16 0654  VITAMINB12 659   Urine analysis:    Component Value Date/Time   COLORURINE AMBER (A) 12/23/2016 1413   APPEARANCEUR HAZY (A) 12/23/2016 1413   LABSPEC 1.009 12/23/2016 1413   PHURINE 6.0 12/23/2016 1413   GLUCOSEU NEGATIVE 12/23/2016 1413   HGBUR SMALL (A) 12/23/2016 1413   HGBUR large 04/13/2010 1228   BILIRUBINUR MODERATE (A) 12/23/2016 1413   KETONESUR NEGATIVE 12/23/2016 1413   PROTEINUR NEGATIVE 12/23/2016 1413   UROBILINOGEN 0.2 04/13/2010 1228   NITRITE NEGATIVE 12/23/2016 1413    LEUKOCYTESUR NEGATIVE 12/23/2016 1413   Sepsis Labs: @LABRCNTIP (procalcitonin:4,lacticidven:4)  ) Recent Results (from the past 240 hour(s))  Urine culture     Status: Abnormal   Collection Time: 12/23/16  3:48 PM  Result Value Ref Range Status   Specimen Description URINE, CLEAN CATCH  Final   Special Requests NONE  Final   Culture MULTIPLE SPECIES PRESENT, SUGGEST RECOLLECTION (A)  Final   Report Status 12/25/2016 FINAL  Final  Culture, blood (routine x 2)     Status: None (Preliminary result)   Collection Time: 12/23/16  4:30 PM  Result Value Ref Range Status   Specimen Description BLOOD LEFT FOREARM  Final   Special Requests   Final    BOTTLES DRAWN AEROBIC AND ANAEROBIC Blood Culture adequate volume   Culture   Final    NO GROWTH 3 DAYS Performed at [facility], [place] [address], [place], [place] [address]    Report Status PENDING  Incomplete  Culture, blood (routine x 2)     Status: None (Preliminary result)   Collection Time: 12/23/16  4:30 PM  Result Value Ref Range Status   Specimen Description BLOOD RIGHT FOREARM  Final   Special Requests   Final    BOTTLES DRAWN AEROBIC ONLY Blood Culture adequate volume   Culture   Final    NO GROWTH 3 DAYS Performed at [facility], [place] [address] [place], [place] [address]    Report Status PENDING  Incomplete  Culture, body fluid-bottle     Status: None (Preliminary result)   Collection Time: 12/24/16  2:30 PM  Result Value Ref Range Status   Specimen Description PERITONEAL  Final   Special Requests BOTTLES DRAWN AEROBIC AND ANAEROBIC 10CC  Final   Culture   Final    NO GROWTH 2 DAYS Performed at [facility], [place] [address], [place], [place] [address]    Report Status PENDING  Incomplete  Gram stain     Status: None   Collection Time: 12/24/16  2:30 PM  Result Value Ref Range Status   Specimen Description PERITONEAL  Final   Special Requests NONE  Final   Gram Stain   Final    FEW WBC PRESENT,  PREDOMINANTLY MONONUCLEAR NO ORGANISMS SEEN Performed at [facility], [address], [place], [place] [address]    Report Status 12/24/2016 FINAL  Final  C difficile quick scan w PCR reflex     Status: None   Collection Time: 12/25/16 10:33 AM  Result Value Ref Range Status   C Diff antigen NEGATIVE NEGATIVE Final   C Diff toxin NEGATIVE NEGATIVE Final   C Diff interpretation No C. difficile detected.  Final      Radiology Studies: US Paracentesis  Result Date: 12/24/2016 INDICATION: Patient with ascites. Request is made for diagnostic and therapeutic paracentesis. EXAM: ULTRASOUND GUIDED DIAGNOSTIC AND THERAPEUTIC PARACENTESIS MEDICATIONS: 10 mL 1% lidocaine COMPLICATIONS: None immediate. PROCEDURE: Informed written consent was obtained from the patient after a discussion of the risks, benefits and alternatives to treatment. A timeout was performed prior to the initiation of the procedure. Initial ultrasound scanning demonstrates a large amount of ascites within the right lateral abdomen. The right lateral abdomen was prepped and draped in the usual sterile fashion. 1% lidocaine was used for local anesthesia. Following this, a 19 gauge, 7-cm, Yueh catheter was introduced. An ultrasound image was saved for documentation purposes. The paracentesis was performed. The catheter was removed and a dressing was applied. The patient tolerated the procedure well without immediate post procedural complication. FINDINGS: A total of approximately 2.6 liters of bright yellow fluid was removed. Samples were sent to the laboratory as requested by the clinical team. IMPRESSION: Successful ultrasound-guided paracentesis yielding 2.6 liters of peritoneal fluid. Read by:  Brynda Greathouse PA-C Electronically Signed   By: Jerilynn Mages.  Shick M.D.   On: 12/24/2016 15:46     Scheduled Meds: . enoxaparin (LOVENOX) injection  40 mg Subcutaneous QHS  . feeding supplement  1 Container Oral TID BM  . feeding supplement  (ENSURE ENLIVE)  237 mL Oral TID BM  . fluconazole  200 mg Oral Daily  . folic acid  1 mg Oral Daily  . multivitamin with minerals  1 tablet Oral Daily  . pantoprazole  40 mg Oral BID  . phytonadione  10 mg Oral Daily  . potassium chloride  40 mEq Oral Daily  . prednisoLONE  40 mg Oral Daily  . psyllium  1 packet Oral Daily  . sodium chloride flush  3 mL Intravenous Q12H  . thiamine  100 mg Oral Daily   Or  . thiamine  100 mg Intravenous Daily   Continuous Infusions: . cefTRIAXone (ROCEPHIN)  IV       LOS: 3 days   Time Spent in minutes   30 minutes  Isadore Bokhari D.O. on 12/26/2016 at 12:46 PM  Between 7am to 7pm - Pager - [phone]  After 7pm go to [email or web address] - password [email or web address]  And look for the night coverage person covering for me after hours  [facility] Office  [phone]

## 2016-12-26 NOTE — Progress Notes (Signed)
Paged: [phone] Fahl would like some Ibuprofen for her headache. She doesn't have anything ordered @ this time. She can't have Tylenol

## 2016-12-27 ENCOUNTER — Encounter (HOSPITAL_COMMUNITY): Payer: Self-pay | Admitting: Internal Medicine

## 2016-12-27 LAB — COMPREHENSIVE METABOLIC PANEL
ALBUMIN: 2 g/dL — AB (ref 3.5–5.0)
ALT: 46 U/L (ref 14–54)
AST: 128 U/L — AB (ref 15–41)
Alkaline Phosphatase: 248 U/L — ABNORMAL HIGH (ref 38–126)
Anion gap: 8 (ref 5–15)
BUN: 10 mg/dL (ref 6–20)
CHLORIDE: 97 mmol/L — AB (ref 101–111)
CO2: 25 mmol/L (ref 22–32)
CREATININE: 0.69 mg/dL (ref 0.44–1.00)
Calcium: 7.9 mg/dL — ABNORMAL LOW (ref 8.9–10.3)
GFR calc Af Amer: >60 mL/min (ref >60)
GFR calc non Af Amer: >60 mL/min (ref >60)
GLUCOSE: 98 mg/dL (ref 65–99)
POTASSIUM: 3.7 mmol/L (ref 3.5–5.1)
Sodium: 130 mmol/L — ABNORMAL LOW (ref 135–145)
Total Bilirubin: 12.9 mg/dL — ABNORMAL HIGH (ref 0.3–1.2)
Total Protein: 5.2 g/dL — ABNORMAL LOW (ref 6.5–8.1)

## 2016-12-27 LAB — FOLATE RBC
Folate, Hemolysate: 195.9 ng/mL
Folate, RBC: 841 ng/mL (ref >498)
Hematocrit: 23.3 % — ABNORMAL LOW (ref 34.0–46.6)

## 2016-12-27 LAB — CBC
HEMATOCRIT: 27.1 % — AB (ref 36.0–46.0)
Hemoglobin: 9.2 g/dL — ABNORMAL LOW (ref 12.0–15.0)
MCH: 38.2 pg — ABNORMAL HIGH (ref 26.0–34.0)
MCHC: 33.9 g/dL (ref 30.0–36.0)
MCV: 112.4 fL — AB (ref 78.0–100.0)
PLATELETS: 336 10*3/uL (ref 150–400)
RBC: 2.41 MIL/uL — ABNORMAL LOW (ref 3.87–5.11)
RDW: 16.3 % — AB (ref 11.5–15.5)
WBC: 26.1 10*3/uL — AB (ref 4.0–10.5)

## 2016-12-27 LAB — PATHOLOGIST SMEAR REVIEW

## 2016-12-27 MED ORDER — IBUPROFEN 200 MG PO TABS
400.0000 mg | ORAL_TABLET | Freq: Once | ORAL | Status: AC
  Administered 2016-12-27: 400 mg via ORAL
  Filled 2016-12-27: qty 2

## 2016-12-27 NOTE — Progress Notes (Signed)
[facility] Progress Note  Chief Complaint:    Jaundice, possible GI bleed  Subjective: feels great today. No abdominal pain. No nausea. No evidence for GI bleeding.   Objective:  Vital signs in last 24 hours: Temp:  [97.8 F (36.6 C)-98.3 F (36.8 C)] 98 F (36.7 C) (05/07 0533) Pulse Rate:  [81-88] 84 (05/07 0533) Resp:  [16-20] 20 (05/07 0533) BP: (91-106)/(64-70) 91/64 (05/07 0533) SpO2:  [99 %-100 %] 99 % (05/07 0533) Last BM Date: 12/26/16 General:   Alert, jaundiced female in NAD. She is thin with muscle wasting.  EENT:  Normal hearing, icteric sclera.  Heart:  Regular rate and rhythm; no murmurs. no lower extremity edema Pulm: Normal respiratory effort, lungs CTA bilaterally without wheezes or crackles. Abdomen:  Soft, moderately distended,  nontender.  Normal bowel sounds, no masses felt. No hepatomegaly.    Neurologic:  Alert and  oriented x4;  grossly normal neurologically. No asterixis Psych:  Alert and cooperative. Normal mood and affect.   Intake/Output from previous day: 05/06 0701 - 05/07 0700 In: 530 [P.O.:480; IV Piggyback:50] Out: -  Intake/Output this shift: Total I/O In: 240 [P.O.:240] Out: -   Lab Results:  Recent Labs  12/25/16 0654 12/26/16 0525 12/27/16 0551  WBC 19.5* 22.5* 26.1*  HGB 8.1* 8.7* 9.2*  HCT 23.1* 25.0* 27.1*  PLT 291 340 336   BMET  Recent Labs  12/25/16 0654 12/26/16 0525 12/27/16 0551  NA 126* 130* 130*  K 2.7* 3.6 3.7  CL 94* 99* 97*  CO2 23 23 25   GLUCOSE 129* 113* 98  BUN 10 9 10   CREATININE 0.65 0.60 0.69  CALCIUM 7.1* 7.5* 7.9*   LFT  Recent Labs  12/27/16 0551  PROT 5.2*  ALBUMIN 2.0*  AST 128*  ALT 46  ALKPHOS 248*  BILITOT 12.9*   PT/INR  Recent Labs  12/25/16 0654  LABPROT 18.8*  INR 1.56    Assessment / Plan:  ETOH hepatitis, possible cirrhosis. Presented with severe jaundice and large volume ascites. Post 2.6 liters removed 12/24/16. No SBP. SAAG is  indeterminate as no exact fluid albumin value resulted ( < 1).  I don't see that cytology was ordered on fluid, if not then will need another tap for cytology at some point. No SBP.  Reported coffee ground emesis, s dark , heme positive stools but no evidence for recent bleeding on EGD yesterday. No varices found, just portal hypertensive gastropathy and severe erosive esophagitis (probable GERD + candida) -continue BID PPI and a anti-reflux measures  -continue empiric diflucan for candida esophagitis -Viral hepatitis studies negative, INR 1.56. No focal liver lesions on CTscan.  -Repeat EGD~ 12 weeks to document healing -Continue prednisolone 28 days for acute alcoholic hepatitis; will need to taper this after 28 days depending on clinical response. LFTs are improving -continue diuretics, low sodium diet, daily weights, ETOH avoidance -In anticipation of discharge I got her a follow up appt with me on 5/21 at 9: 30am  Abnormal radiologic imaging of pancreas. Focal hypodensity within or above pancreatic head.  -We will need MRI/MRCP at some point to reevaluate pancreas and rule out pancreatic head lesion  Loose stool, improving. C-diff negative  Colon cancer screening.  -outpatient  Principal Problem:   Acute alcoholic hepatitis Active Problems:   ETOH abuse   Protein-calorie malnutrition, severe   Coffee ground emesis   Acute esophagitis   Ascites   LOS: 4 days   Tye Savoy NP  12/27/2016,  9:32 AM  Pager number [phone]  GI ATTENDING  Interval history and data reviewed. Case reviewed with Dr. Hilarie Fredrickson. Patient personally seen and examined. Agree with interval progress note. Patient is stable. I again reviewed with her problem and treatment plan including follow-up. We'll the home on diuretics, prednisone, and Diflucan. She verbalizes a clear understanding of complete abstinence from alcohol as the cornerstone of her treatment plan. She Will follow-up with [name] and Dr. Hilarie Fredrickson  in the office. Will sign off  [name] N. Geri Seminole., M.D. [facility] of Gastroenterology

## 2016-12-27 NOTE — Progress Notes (Signed)
PROGRESS NOTE    Mercedes Mitchell  GEZ:[id number] DOB: April 19, 1961 DOA: 12/23/2016 PCP: No primary care provider on file.   Chief Complaint  Patient presents with  . Abdominal Pain    Brief Narrative:  HPI On 12/23/2016 by Dr. Jennette Kettle Mercedes Mitchell is a 56 y.o. female with medical history significant of ongoing EtOH abuse.  After discussion with patient and husband, I suspect patient is somewhat under-estimating her EtOH intake which seems to be in the realm of 2.5 gallons per week per husband.  Both agree that they are concerned that it might be too much. Patient presents to the ED with c/o 1.5 months of Jaundice and now 2-3 weeks of Abd pain and distention with intermittent vomiting during this time.  12 lb weight loss over past 6 months.  No UOP today and only one void yesterday.  Last BM this AM.  Last drink was last night. Saw PCP today who referred her to the ED.  Interim history GI consulted and s/p EGD. Will need to continue IV antibiotics.  Assessment & Plan   Acute alcoholic hepatitis with elevated bilirubin and transaminases -CT Abdomen and pelvis showed severe hypodense hepatomegaly concerning for acute hepatitis. Large-volume ascites.  -[place] abd: Hepatic steatosis, gallbladder sludge and small stones. No signs of acute cholecystitis. Small volume of peritoneal ascites -Gastroenterology consulted and appreciated -Hepatitis panel negative, HIV nonreactive -Continue PPI BID -bilirubin improving down to 12.9 today (was 21.6 upon admission) -Continue to monitor CMP -Prednisolone added by GI- continue for 28 days -Continue ceftriaxone (continue through 12/28/16) -S/p EGD: LA grade D reflux, likely candidiasis, acute and erosive esophagitis. Mild non bleeding portal hypertensive gastropathy. 4cm hiatal hernia. No evidence of active or recently bleeding in upper GI tract though ulcerative esophagitis would explain coffee ground emesis and heme + stool. No evidence of gastric or  esophageal varices -Recommendations: continue PPI BID, repeat EGD in 12 weeks, empiric therapy for candida esophagitis, advance diet as tolerated -Cdiff negative  Candida Esophagitis -Continue diflucan  Ascites  -Secondary to the above -US paracentesis yielded 2.6L of bright yellow fluid -labs sent, SAAG ? As fluid albumin <1  (low gradient) -Culture shows no growth to date -Blood culture show no growth to date  Alcohol abuse -Drinks approximately 2/3 of 1/5th bottle of vodka daily -Alcohol level <5 -Currently not in withdrawal -Continue CIWA protocol, multivitamin, folic acid, and thiamine  Hematemesis  -FOBT + -GI consulted, EGD as above -Continue diflucan, PPI  Macrocytic anemia -Unknown baseline hemoglobin however 2011 hemoglobin was 14.5. -Currently hemoglobin 9.2 -Continue to monitor CBC  Severe malnutrition -Nutrition consulted -Continue feeding supplements -Tolerating soft diet   Acute kidney injury -Creatinine upon admsision 1.3, currently creatinine 0.69 -Continue to monitor BMP  Hyponatremia -Sodium improving, currently 130 -Continue IV fluids, monitor BMP  Hypokalemia -Resolved currently 3.7 -continue potassium supplementation and monitor CMP  Hypomagnesemia  -Resolved, continue to monitor  DVT Prophylaxis  Lovenox  Code Status: Full  Family Communication: None at bedside  Disposition Plan: Admitted. Possible discharge to home on 5/8 once IV antibiotics are completed.   Consultants Gastroenterology  Procedures  Abdominal US EGD  Antibiotics   Anti-infectives    Start     Dose/Rate Route Frequency Ordered Stop   12/26/16 2200  cefTRIAXone (ROCEPHIN) 2 g in dextrose 5 % 50 mL IVPB     2 g 100 mL/hr over 30 Minutes Intravenous Daily at bedtime 12/26/16 0903 12/28/16 2159   12/26/16 1000  fluconazole (DIFLUCAN)  IVPB 200 mg  Status:  Discontinued     200 mg 100 mL/hr over 60 Minutes Intravenous Every 24 hours 12/25/16 0843 12/26/16 0902     12/26/16 1000  fluconazole (DIFLUCAN) tablet 200 mg     200 mg Oral Daily 12/26/16 0902     12/25/16 1000  fluconazole (DIFLUCAN) IVPB 400 mg     400 mg 100 mL/hr over 120 Minutes Intravenous  Once 12/25/16 0843 12/25/16 1147   12/23/16 2245  cefTRIAXone (ROCEPHIN) 2 g in dextrose 5 % 50 mL IVPB  Status:  Discontinued     2 g 100 mL/hr over 30 Minutes Intravenous Daily at bedtime 12/23/16 2227 12/26/16 2536      Subjective:   Mercedes Mitchell seen and examined today.  Patient states she is feeling much better. Has been able to walk around. Tolerating her diet well. Denies abdominal pain, nausea, vomiting. Still having some loose stools. Denies chest pain, shortness of breath, dizziness, headache. Wonders if the hospital has a Pharmacist, community.  Objective:   Vitals:   12/26/16 1350 12/26/16 2059 12/27/16 0533 12/27/16 1030  BP: 106/67 91/70 91/64    Pulse: 88 81 84   Resp: 18 16 20    Temp: 98.3 F (36.8 C) 97.8 F (36.6 C) 98 F (36.7 C)   TempSrc: Oral Oral Oral   SpO2: 100% 99% 99%   Weight:    51.4 kg (113 lb 4.8 oz)  Height:        Intake/Output Summary (Last 24 hours) at 12/27/16 1232 Last data filed at 12/27/16 0801  Gross per 24 hour  Intake              530 ml  Output                0 ml  Net              530 ml   Filed Weights   12/24/16 1318 12/25/16 0721 12/27/16 1030  Weight: 54 kg (119 lb) 54 kg (119 lb) 51.4 kg (113 lb 4.8 oz)    Exam   General: Well developed, Cachectic, no apparent distress  HEENT: NCAT, Icteric sclera, mucous membranes moist.   Cardiovascular: S1 S2 auscultated, RRR, no murmurs  Respiratory: Clear to auscultation bilaterally  Abdomen: Soft, nontender, +distended, + bowel sounds, +hepatomegaly  Extremities: warm dry without cyanosis clubbing. 2+ LE edema bilaterally  Neuro: AAOx3, nonfocal, no asterixis or tremor  Skin: Jaundice  Psych: Appropriate mood and affect   Data Reviewed: I have personally reviewed following labs and imaging  studies  CBC:  Recent Labs Lab 12/23/16 1420 12/24/16 0656 12/25/16 0654 12/26/16 0525 12/27/16 0551  WBC 26.8* 24.8* 19.5* 22.5* 26.1*  HGB 11.1* 9.4* 8.1* 8.7* 9.2*  HCT 31.4* 26.5* 23.1* 25.0* 27.1*  MCV 108.3* 109.5* 108.5* 110.6* 112.4*  PLT 327 285 291 340 644   Basic Metabolic Panel:  Recent Labs Lab 12/23/16 1420 12/24/16 0656 12/25/16 0654 12/26/16 0525 12/27/16 0551  NA 119* 120* 126* 130* 130*  K 2.9* 2.8* 2.7* 3.6 3.7  CL 79* 84* 94* 99* 97*  CO2 24 22 23 23 25   GLUCOSE 126* 108* 129* 113* 98  BUN 8 10 10 9 10   CREATININE 1.30* 1.01* 0.65 0.60 0.69  CALCIUM 7.8* 7.0* 7.1* 7.5* 7.9*  MG 1.5*  --  2.3  --   --    GFR: Estimated Creatinine Clearance: 65.2 mL/min (by C-G formula based on SCr of 0.69 mg/dL). Liver Function Tests:  Recent Labs Lab 12/23/16 1420 12/24/16 0656 12/25/16 0654 12/26/16 0525 12/27/16 0551  AST 137* 113* 91* 128* 128*  ALT 46 37 34 39 46  ALKPHOS 304* 234* 217* 243* 248*  BILITOT 21.6* 17.8* 15.1* 14.4* 12.9*  PROT 6.1* 5.1* 5.0* 4.9* 5.2*  ALBUMIN 2.3* 1.8* 1.9* 2.0* 2.0*    Recent Labs Lab 12/23/16 1420  LIPASE 23   No results for input(s): AMMONIA in the last 168 hours. Coagulation Profile:  Recent Labs Lab 12/23/16 1420 12/24/16 0656 12/25/16 0654  INR 1.35 1.61 1.56   Cardiac Enzymes: No results for input(s): CKTOTAL, CKMB, CKMBINDEX, TROPONINI in the last 168 hours. BNP (last 3 results) No results for input(s): PROBNP in the last 8760 hours. HbA1C: No results for input(s): HGBA1C in the last 72 hours. CBG: No results for input(s): GLUCAP in the last 168 hours. Lipid Profile: No results for input(s): CHOL, HDL, LDLCALC, TRIG, CHOLHDL, LDLDIRECT in the last 72 hours. Thyroid Function Tests: No results for input(s): TSH, T4TOTAL, FREET4, T3FREE, THYROIDAB in the last 72 hours. Anemia Panel:  Recent Labs  12/25/16 0654  VITAMINB12 659   Urine analysis:    Component Value Date/Time    COLORURINE AMBER (A) 12/23/2016 1413   APPEARANCEUR HAZY (A) 12/23/2016 1413   LABSPEC 1.009 12/23/2016 1413   PHURINE 6.0 12/23/2016 1413   GLUCOSEU NEGATIVE 12/23/2016 1413   HGBUR SMALL (A) 12/23/2016 1413   HGBUR large 04/13/2010 1228   BILIRUBINUR MODERATE (A) 12/23/2016 1413   KETONESUR NEGATIVE 12/23/2016 1413   PROTEINUR NEGATIVE 12/23/2016 1413   UROBILINOGEN 0.2 04/13/2010 1228   NITRITE NEGATIVE 12/23/2016 1413   LEUKOCYTESUR NEGATIVE 12/23/2016 1413   Sepsis Labs: @LABRCNTIP (procalcitonin:4,lacticidven:4)  ) Recent Results (from the past 240 hour(s))  Urine culture     Status: Abnormal   Collection Time: 12/23/16  3:48 PM  Result Value Ref Range Status   Specimen Description URINE, CLEAN CATCH  Final   Special Requests NONE  Final   Culture MULTIPLE SPECIES PRESENT, SUGGEST RECOLLECTION (A)  Final   Report Status 12/25/2016 FINAL  Final  Culture, blood (routine x 2)     Status: None (Preliminary result)   Collection Time: 12/23/16  4:30 PM  Result Value Ref Range Status   Specimen Description BLOOD LEFT FOREARM  Final   Special Requests   Final    BOTTLES DRAWN AEROBIC AND ANAEROBIC Blood Culture adequate volume   Culture   Final    NO GROWTH 3 DAYS Performed at [facility], [place] [address] [place], [place] [address]    Report Status PENDING  Incomplete  Culture, blood (routine x 2)     Status: None (Preliminary result)   Collection Time: 12/23/16  4:30 PM  Result Value Ref Range Status   Specimen Description BLOOD RIGHT FOREARM  Final   Special Requests   Final    BOTTLES DRAWN AEROBIC ONLY Blood Culture adequate volume   Culture   Final    NO GROWTH 3 DAYS Performed at [facility], [place] [address], [place],  [address]    Report Status PENDING  Incomplete  Culture, body fluid-bottle     Status: None (Preliminary result)   Collection Time: 12/24/16  2:30 PM  Result Value Ref Range Status   Specimen Description PERITONEAL  Final    Special Requests BOTTLES DRAWN AEROBIC AND ANAEROBIC 10CC  Final   Culture   Final    NO GROWTH 2 DAYS Performed at [facility], [place] [address], [place], [place] [address]    Report Status PENDING  Incomplete  Gram stain     Status: None   Collection Time: 12/24/16  2:30 PM  Result Value Ref Range Status   Specimen Description PERITONEAL  Final   Special Requests NONE  Final   Gram Stain   Final    FEW WBC PRESENT, PREDOMINANTLY MONONUCLEAR NO ORGANISMS SEEN Performed at [facility], [address] [place], [place] [address]    Report Status 12/24/2016 FINAL  Final  C difficile quick scan w PCR reflex     Status: None   Collection Time: 12/25/16 10:33 AM  Result Value Ref Range Status   C Diff antigen NEGATIVE NEGATIVE Final   C Diff toxin NEGATIVE NEGATIVE Final   C Diff interpretation No C. difficile detected.  Final      Radiology Studies: No results found.   Scheduled Meds: . enoxaparin (LOVENOX) injection  40 mg Subcutaneous QHS  . feeding supplement  1 Container Oral TID BM  . feeding supplement (ENSURE ENLIVE)  237 mL Oral TID BM  . fluconazole  200 mg Oral Daily  . folic acid  1 mg Oral Daily  . furosemide  40 mg Oral Daily  . multivitamin with minerals  1 tablet Oral Daily  . pantoprazole  40 mg Oral BID  . phytonadione  10 mg Oral Daily  . potassium chloride  40 mEq Oral Daily  . prednisoLONE  40 mg Oral Daily  . psyllium  1 packet Oral Daily  . sodium chloride flush  3 mL Intravenous Q12H  . spironolactone  100 mg Oral Daily  . thiamine  100 mg Oral Daily   Or  . thiamine  100 mg Intravenous Daily   Continuous Infusions: . cefTRIAXone (ROCEPHIN)  IV Stopped (12/26/16 2145)     LOS: 4 days   Time Spent in minutes   30 minutes  Kaysin Brock D.O. on 12/27/2016 at 12:32 PM  Between 7am to 7pm - Pager - [phone]  After 7pm go to [email or web address] - password [email or web address]  And look for the night coverage person covering for me after  hours  [facility] Office  [phone]

## 2016-12-27 NOTE — Anesthesia Postprocedure Evaluation (Signed)
Anesthesia Post Note  Patient: Mercedes Mitchell  Procedure(s) Performed: Procedure(s) (LRB): ESOPHAGOGASTRODUODENOSCOPY (EGD) (N/A)  Patient location during evaluation: PACU Anesthesia Type: MAC Level of consciousness: awake and alert Pain management: pain level controlled Vital Signs Assessment: post-procedure vital signs reviewed and stable Respiratory status: spontaneous breathing, nonlabored ventilation, respiratory function stable and patient connected to nasal cannula oxygen Cardiovascular status: stable and blood pressure returned to baseline Anesthetic complications: no       Last Vitals:  Vitals:   12/26/16 2059 12/27/16 0533  BP: 91/70 91/64  Pulse: 81 84  Resp: 16 20  Temp: 36.6 C 36.7 C    Last Pain:  Vitals:   12/27/16 0533  TempSrc: Oral  PainSc:                  [place]

## 2016-12-28 LAB — CULTURE, BLOOD (ROUTINE X 2)
CULTURE: NO GROWTH
CULTURE: NO GROWTH
Special Requests: ADEQUATE
Special Requests: ADEQUATE

## 2016-12-28 LAB — CBC
HEMATOCRIT: 26.2 % — AB (ref 36.0–46.0)
HEMOGLOBIN: 8.9 g/dL — AB (ref 12.0–15.0)
MCH: 38.5 pg — ABNORMAL HIGH (ref 26.0–34.0)
MCHC: 34 g/dL (ref 30.0–36.0)
MCV: 113.4 fL — AB (ref 78.0–100.0)
Platelets: 350 10*3/uL (ref 150–400)
RBC: 2.31 MIL/uL — ABNORMAL LOW (ref 3.87–5.11)
RDW: 16.8 % — AB (ref 11.5–15.5)
WBC: 28.5 10*3/uL — ABNORMAL HIGH (ref 4.0–10.5)

## 2016-12-28 LAB — COMPREHENSIVE METABOLIC PANEL
ALBUMIN: 1.9 g/dL — AB (ref 3.5–5.0)
ALT: 51 U/L (ref 14–54)
AST: 133 U/L — AB (ref 15–41)
Alkaline Phosphatase: 241 U/L — ABNORMAL HIGH (ref 38–126)
Anion gap: 7 (ref 5–15)
BUN: 13 mg/dL (ref 6–20)
CHLORIDE: 96 mmol/L — AB (ref 101–111)
CO2: 26 mmol/L (ref 22–32)
Calcium: 7.7 mg/dL — ABNORMAL LOW (ref 8.9–10.3)
Creatinine, Ser: 0.67 mg/dL (ref 0.44–1.00)
GFR calc Af Amer: >60 mL/min (ref >60)
GFR calc non Af Amer: >60 mL/min (ref >60)
GLUCOSE: 87 mg/dL (ref 65–99)
POTASSIUM: 4.4 mmol/L (ref 3.5–5.1)
SODIUM: 129 mmol/L — AB (ref 135–145)
Total Bilirubin: 10.7 mg/dL — ABNORMAL HIGH (ref 0.3–1.2)
Total Protein: 5 g/dL — ABNORMAL LOW (ref 6.5–8.1)

## 2016-12-28 LAB — MAGNESIUM: Magnesium: 1.7 mg/dL (ref 1.7–2.4)

## 2016-12-28 MED ORDER — SPIRONOLACTONE 100 MG PO TABS: 100.0000 mg | ORAL_TABLET | Freq: Every day | ORAL | 0 refills | Status: DC

## 2016-12-28 MED ORDER — FOLIC ACID 1 MG PO TABS: 1.0000 mg | ORAL_TABLET | Freq: Every day | ORAL | 0 refills | Status: DC

## 2016-12-28 MED ORDER — THIAMINE HCL 100 MG PO TABS: 100.0000 mg | ORAL_TABLET | Freq: Every day | ORAL | 0 refills | Status: DC

## 2016-12-28 MED ORDER — FUROSEMIDE 20 MG PO TABS: 20.0000 mg | ORAL_TABLET | Freq: Every day | ORAL | 0 refills | Status: DC

## 2016-12-28 MED ORDER — PSYLLIUM 95 % PO PACK: 1.0000 | PACK | Freq: Every day | ORAL | 0 refills | Status: DC

## 2016-12-28 MED ORDER — ENSURE ENLIVE PO LIQD: 237.0000 mL | Freq: Three times a day (TID) | ORAL | 0 refills | Status: DC

## 2016-12-28 MED ORDER — PREDNISOLONE 5 MG PO TABS: 40.0000 mg | ORAL_TABLET | Freq: Every day | ORAL | 0 refills | Status: DC

## 2016-12-28 MED ORDER — PANTOPRAZOLE SODIUM 40 MG PO TBEC: 40.0000 mg | DELAYED_RELEASE_TABLET | Freq: Two times a day (BID) | ORAL | 0 refills | Status: DC

## 2016-12-28 MED ORDER — FLUCONAZOLE 200 MG PO TABS: 200.0000 mg | ORAL_TABLET | Freq: Every day | ORAL | 0 refills | Status: DC

## 2016-12-28 MED ORDER — ADULT MULTIVITAMIN W/MINERALS CH: 1.0000 | ORAL_TABLET | Freq: Every day | ORAL | 0 refills | Status: DC

## 2016-12-28 NOTE — Clinical Social Work Note (Signed)
Clinical Social Work Assessment  Patient Details  Name: Mercedes Mitchell MRN: [id number] Date of Birth: 1961-07-02  Date of referral:  12/28/16               Reason for consult:  Substance Use/ETOH Abuse                Permission sought to share information with:    Permission granted to share information::     Name::        Agency::     Relationship::   Spouse  Contact Information:   [phone]  Housing/Transportation Living arrangements for the past 2 months:  Single Family Home Source of Information:  Patient, Spouse Patient Interpreter Needed:  None Criminal Activity/Legal Involvement Pertinent to Current Situation/Hospitalization:  No - Comment as needed Significant Relationships:  Spouse Lives with:  Spouse Do you feel safe going back to the place where you live?  Yes Need for family participation in patient care:  No (Coment)  Care giving concerns:  Outpatient Resources/ AA groups in [place] area   Facilities manager / plan:  CSW met with patient and spouse at bedside, explain role and reason for csw intervention. Patient calm, alert and agreeable to talk. Patient states she was hopeful to see a Education officer, museum about treatment resources. CSW provided patient with a list of outpatient and residential treatment facilities. CSW educated the patient and spouse about her options. CSW also provided list of AA groups in the [facility] area. CSW provided support and encouragement.  Patient appreciative of CSW intervention.  No other needs identified at this time.   Employment status:    Insurance information:  Other (Comment Required) Psychologist, counselling) PT Recommendations:  Not assessed at this time Information / Referral to community resources:  Outpatient Substance Abuse Treatment Options  Patient/Family's Response to care:  Agreeable and responding well to care.   Patient/Family's Understanding of and Emotional Response to Diagnosis, Current Treatment, and Prognosis:  Patient states " I think these programs will be good start for me. The groups are close to my home." Patient spouse very supportive of patient and has started calling facilities to inquire about their treatment options.   Emotional Assessment Appearance:  Developmentally appropriate Attitude/Demeanor/Rapport:    Affect (typically observed):  Accepting, Calm, Pleasant, Hopeful Orientation:  Oriented to Self, Oriented to Place, Oriented to  Time, Oriented to Situation Alcohol / Substance use:  Alcohol Use Psych involvement (Current and /or in the community):  No (Comment)  Discharge Needs  Concerns to be addressed:  Discharge Planning Concerns, Care Coordination Readmission within the last 30 days:  No Current discharge risk:  None Barriers to Discharge:  No Barriers Identified   Lia Hopping, [place] 12/28/2016, 10:49 AM

## 2016-12-28 NOTE — Progress Notes (Signed)
Nutrition Follow Up Note  DOCUMENTATION CODES:   Severe malnutrition in context of chronic illness  INTERVENTION:   Boost Breeze po TID, each supplement provides 250 kcal and 9 grams of protein  Ensure Enlive po BID, each supplement provides 350 kcal and 20 grams of protein  Magic cup TID with meals, each supplement provides 290 kcal and 9 grams of protein  MVI  NUTRITION DIAGNOSIS:   Malnutrition (severe) related to chronic illness, other (see comment) (etoh abuse), hepatitis as evidenced by severe depletion of muscle mass, severe depletion of body fat.  GOAL:   Patient will meet greater than or equal to 90% of their needs  MONITOR:   PO intake, Supplement acceptance, Labs, Weight trends  ASSESSMENT:   56 y.o. female with medical history significant of ongoing EtOH abuse. Patient presents to the ED with c/o 1.5 months of Jaundice and now 2-3 weeks of Abd pain and distention with intermittent vomiting during this time.  Pt admitted for alcoholic hepatitis    Met with pt in room today. Pt feeling much better and reports appetite is improving. Pt currently eating multiple small meals and tolerating soft diet. Pt also drinking 1-2 Ensures per day. Pt had paracentesis 5/4 and had 2.6L fluid removed. Pt had EGD on 5/5 which showed LA grade D reflux, likely candidiasis, acute and erosive esophagitis. Mild non bleeding portal hypertensive gastropathy. 4cm hiatal hernia. Pt scheduled to go home today. RD educated pt today regarding high protein low sodium diet.   Medications reviewed and include: lovenox, folic acid, MVI, lasix, protonix, KCl, thiamine, prednisolone, psyllium, aldactone   Labs reviewed: Na 129(L), Cl 96(L), Ca 7.7(L), AlkPhos 241(H), alb 1.9(L), AST 133(H) Wbc- 28.5(H), Hgb 8.9(L), Hct 26.2(L)  Diet Order:  DIET SOFT Room service appropriate? Yes; Fluid consistency: Thin  Skin:  Reviewed, no issues (ascites)  Last BM:  5/6  Height:   Ht Readings from Last 1  Encounters:  12/25/16 _0  (1.676 m)    Weight:   Wt Readings from Last 1 Encounters:  12/27/16 113 lb 4.8 oz (51.4 kg)    Ideal Body Weight:  59 kg  BMI:  Body mass index is 18.29 kg/m.  Estimated Nutritional Needs:   Kcal:  1650-1950kcal/day   Protein:  77-87g/day   Fluid:  >1.6L/day   EDUCATION NEEDS:   Education needs addressed  Koleen Distance, RD, LDN Pager #- [phone]

## 2016-12-28 NOTE — Discharge Instructions (Signed)
Alcoholic Hepatitis Alcoholic hepatitis is liver inflammation caused by drinking alcohol. This inflammation decreases the liver's ability to function normally. What are the causes? Alcoholic hepatitis is caused by heavy drinking. What increases the risk? You may have an increased risk of alcoholic hepatitis if:  You drink large amounts of alcohol.  You have been drinking heavily for years.  You binge drink.  You are female.  You are obese.  You have had infectious hepatitis.  You are malnourished.  You have close family members who have had alcoholic hepatitis. What are the signs or symptoms?  Abdominal pain.  A swollen abdomen.  Loss of appetite.  Unintentional weight loss.  Nausea and vomiting.  Tiredness.  Dry mouth.  Severe thirst.  A yellow tone to the skin and whites of the eyes (jaundice).  Spidery veins, especially across the skin of the abdomen.  Unusual bleeding.  Itching.  Trouble thinking clearly.  Memory problems.  Mood changes.  Confusion.  Numbness and tingling in the feet and legs. How is this diagnosed? Alcoholic hepatitis is diagnosed with blood tests that show problems with liver function. Additional tests may also be done, such as:  An ultrasound.  A CT scan.  An MRI scan.  A liver biopsy. For this test, a small sample of the liver will be taken and examined for evidence of liver damage. How is this treated? The most important step you can take to treat alcoholic hepatitis is to stop drinking alcohol. If you are addicted to alcohol, your health care provider will help you create a plan to quit. It may involve:  Taking medicine to decrease withdrawal symptoms.  Entering a program to help you stop drinking.  Joining a support group. Additional treatment for alcoholic hepatitis may include:  Medicines such as steroids. The medicines will help decrease the inflammation.  Diet. Your health care provider might ask you to  undergo nutritional counseling and follow a diet. You may also need to take dietary supplements and vitamins.  A liver transplant. This procedure is performed in very severe cases. It is only performed on people who have totally stopped drinking and can commit to never drinking alcohol again. Follow these instructions at home:  Do not drink alcohol.  Do not use medicines or eat foods that contain alcohol.  Take medicines only as directed by your health care provider.  Follow dietary instructions carefully.  Keep all follow-up visits as directed by your health care provider. This is important. Contact a health care provider if:  You have a fever.  You do not have your usual appetite.  You have flu-like symptoms such as fatigue, weakness, or muscle aches.  You feel nauseous or vomit.  You bruise easily.  Your urine is very dark.  You have new abdominal pain. Get help right away if:  There is blood in your vomit.  You develop jaundice.  Your skin itches severely.  Your legs swell.  Your stomach appears bloated.  You have black, tarry-appearing stools.  You bleed easily.  You are confused or not thinking clearly.  You have a seizure. This information is not intended to replace advice given to you by your health care provider. Make sure you discuss any questions you have with your health care provider. Document Released: 03/06/2014 Document Revised: 01/15/2016 Document Reviewed: 10/03/2013 Elsevier Interactive Patient Education  2017 [employer]  Alcoholic Liver Disease The liver is an organ that converts food into energy, absorbs vitamins from food, removes toxins from the  blood, and makes proteins. Alcoholic liver disease happens when the liver becomes damaged due to alcohol consumption and it stops working properly. What are the causes? This condition is caused by drinking too much alcohol over a number of years. What increases the risk? This condition is  more likely to develop in:  Women.  People who have a family history of the disease.  People who have poor nutrition.  People who are obese. What are the signs or symptoms? Early symptoms of this condition include:  Fatigue.  Weakness.  Abdominal discomfort on the upper right side. Symptoms of moderate disease include:  Fever.  Yellow, pale, or darkening skin.  Abdominal pain.  Nausea and vomiting.  Weight loss.  Loss of appetite. Symptoms of advanced disease include:  Abdominal swelling.  Nosebleeds or bleeding gums.  Itchy skin.  Enlarged fingertips (clubbing).  Light-colored stools that smell very bad (steatorrhea).  Mood changes.  Feeling agitated.  Confusion.  Trouble concentrating. Some people do not have symptoms until the condition becomes severe. Symptoms are often worse after heavy drinking. How is this diagnosed? This condition is diagnosed with:  A physical exam.  Blood tests.  Taking a sample of liver tissue to examine under a microscope (liver biopsy). You may also have other tests, including:  X-rays.  Ultrasound. How is this treated? Treatment may include:  Stopping alcohol use to allow the liver to heal.  Joining a support group or meeting with a counselor.  Medicines to reduce inflammation. These may be recommended if the disease is severe.  A liver transplant. This is the only treatment if the disease is very severe.  Nutritional therapy. This may involve:  Taking vitamins.  Eating foods that are high in thiamine, such as whole-wheat cereals, pork, and raw vegetables.  Eating foods that have a lot of folic acid, such as vegetables, fruits, meats, beans, nuts, and dairy foods.  Eating a diet that includes carbohydrate-rich foods, such as yogurt, beans, potatoes, and rice. Follow these instructions at home:  Do not drink alcohol.  Take medicines only as directed by your health care provider.  Take vitamins only  as directed by your health care provider.  Follow any diet instructions that are given to you by your health care provider. Contact a health care provider if:  You have a fever.  You have shortness of breath or have difficulty breathing.  You have bright red blood in your stool, or you have black, tarry stools.  You are vomiting blood.  Your skin color becomes more yellow, pale, or dark.  You develop headaches.  You have trouble thinking.  You have problems balancing or walking. This information is not intended to replace advice given to you by your health care provider. Make sure you discuss any questions you have with your health care provider. Document Released: 08/30/2014 Document Revised: 01/15/2016 Document Reviewed: 07/11/2014 Elsevier Interactive Patient Education  2017 [employer].

## 2016-12-28 NOTE — Progress Notes (Signed)
Date: Dec 28, 2016 Discharge orders review for case management needs. Md wants op rehab-pt/informed that patient will need written script for this and [facility] on [address]. Velva Harman, BSN, [place], CCM: [phone]

## 2016-12-28 NOTE — Discharge Summary (Signed)
Physician Discharge Summary  Mercedes Mitchell JOA:[id number] DOB: Feb 12, 1961 DOA: 12/23/2016  PCP: No primary care provider on file.  Admit date: 12/23/2016 Discharge date: 12/28/2016  Time spent: 45 minutes  Recommendations for Outpatient Follow-up:  Follow up with Mitchell on 12/31/16 for CBC and BMP check.  Take daily weights and report them to [facility], Therapist, sports at [facility].  You will need repeat EGD in 12 weeks and screening colonoscopy.  Patient should continue medications as prescribed.   Patient should follow a soft diet, advance to low sodium diet as tolerated.  Advised alcohol and tobacco cessation.  Discharge Diagnoses:  Acute alcoholic hepatitis with elevated bilirubin and transaminases Candida esophagitis Ascites Hematemesis Alcohol abuse Macrocytic anemia Severe malnutrition Acute kidney injury Hyponatremia Hypokalemia Hypomagnesemia Leukocytosis  Discharge Condition: Stable  Diet recommendation: low sodium diet  Filed Weights   12/24/16 1318 12/25/16 0721 12/27/16 1030  Weight: 54 kg (119 lb) 54 kg (119 lb) 51.4 kg (113 lb 4.8 oz)    History of present illness:  On 12/23/2016 by Dr. Ila Mcgill Mitchell a 56 y.o.femalewith medical history significant of ongoing EtOH abuse. After discussion with patient and husband, I suspect patient is somewhat under-estimating her EtOH intake which seems to be in the realm of 2.5 gallons per week per husband. Both agree that they are concerned that it might be too much. Patient presents to the ED with c/o 1.5 months of Jaundice and now 2-3 weeks of Abd pain and distention with intermittent vomiting during this time. 12 lb weight loss over past 6 months. No UOP today and only one void yesterday. Last BM this AM. Last drink was last night. Saw PCP today who referred her to the ED.  Hospital Course:  Acute alcoholic hepatitis with elevated bilirubin and transaminases -CT Abdomen and pelvis  showed severe hypodense hepatomegaly concerning for acute hepatitis. Large-volume ascites.  -[place] abd: Hepatic steatosis, gallbladder sludge and small stones. No signs of acute cholecystitis. Small volume of peritoneal ascites -Mitchell consulted and appreciated -Hepatitis panel negative, HIV nonreactive -Continue PPI BID -bilirubin improving down to 10.7 today (was 21.6 upon admission) -Prednisolone added by GI- continue for 28 days -Completed ceftriaxone course (prophylaxis) -S/p EGD: LA grade D reflux, likely candidiasis, acute and erosive esophagitis. Mild non bleeding portal hypertensive gastropathy. 4cm hiatal hernia. No evidence of active or recently bleeding in upper GI tract though ulcerative esophagitis would explain coffee ground emesis and heme + stool. No evidence of gastric or esophageal varices -Recommendations: continue PPI BID, repeat EGD in 12 weeks, empiric therapy for candida esophagitis for 14 days, advance diet as tolerated -Cdiff negative -Will discharge patient home with PPI BID, diflucan, prednisolone, lasix (20mg  daily) and spironolactone 100mg  daily -Discussed with diuretics with GI as patient has been hypotensive. GI recommended that patient keep record of daily weights and report them to [facility] RN at the office. Follow up BMP in one week.  Candida Esophagitis -Continue diflucan (14 days total)  Ascites  -Secondary to the above -US paracentesis yielded 2.6L of bright yellow fluid -labs sent, SAAG ? As fluid albumin <1  (low gradient) -Culture shows no growth to date -Blood culture show no growth to date  Alcohol abuse -Drinks approximately 2/3 of 1/5th bottle of vodka daily -Alcohol level <5 -Was placed on CIWA protocol, multivitamin, folic acid, thiamine -No signs or symptoms of withdrawal -Discussed the importance of alcohol cessation -Social work consulted and provided list of AA groups/support   Hematemesis  -FOBT + -GI  consulted, EGD as  above -Continue diflucan, PPI  Macrocytic anemia -Unknown baseline hemoglobin however 2011 hemoglobin was 14.5. -Currently hemoglobin 8.9 -Repeat CBC in 1 week  Severe malnutrition -Nutrition consulted -Continue feeding supplements -Tolerating soft diet   Acute kidney injury -Creatinine upon admsision 1.3, currently creatinine 0.69 -Continue to monitor BMP  Hyponatremia -Likely secondary to the above -Repeat BMP in 1 week  Hypokalemia -Resolved currently 4.4 -Repeat BMP 1 week  Hypomagnesemia  -Resolved  Leukocytosis -Suspect secondary to prednisolone -Of note, patient will continue prednisolone for a total of 28 days -Would repeat CBC after completion of course  Consultants [facility]  Procedures  Abdominal US EGD  Discharge Exam: Vitals:   12/28/16 0508 12/28/16 0522  BP: (!) 78/54 90/65  Pulse: 73   Resp: 18   Temp: 97.9 F (36.6 C)    Patient states she is feeling better. Denies current chest pain or shortness of breath, abdominal pain, nausea or vomiting. Does continue to have loose bowel movements. Would like more information regarding alcohol detox programs as well as outpatient physical therapy and nutrition.   General: Well developed, Cachectic, no apparent distress  HEENT: NCAT, Scleral icterus, mucous membranes moist.  Cardiovascular: S1 S2 auscultated, no rubs, murmurs or gallops. Regular rate and rhythm.  Respiratory: Clear to auscultation bilaterally with equal chest rise  Abdomen: Soft, nontender, nondistended, + bowel sounds  Extremities: warm dry without cyanosis clubbing. Bilateral pitting LE edema  Neuro: AAOx3, nonfocal  Skin: Without rashes exudates or nodules, less jaundice, multiple bruises  Psych: appropriate mood and affect  Discharge Instructions Discharge Instructions    Discharge instructions    Complete by:  As directed    Follow up with Mitchell on 12/31/16 for CBC and BMP check.  Take daily  weights and report them to [facility], Therapist, sports at [facility].  You will need repeat EGD in 12 weeks and screening colonoscopy.  Patient should continue medications as prescribed.   Patient should follow a soft diet, advance to low sodium diet as tolerated.  Advised alcohol and tobacco cessation.     Current Discharge Medication List    START taking these medications   Details  feeding supplement, ENSURE ENLIVE, (ENSURE ENLIVE) LIQD Take 237 mLs by mouth 3 (three) times daily between meals. Qty: 90 Bottle, Refills: 0    fluconazole (DIFLUCAN) 200 MG tablet Take 1 tablet (200 mg total) by mouth daily. Qty: 11 tablet, Refills: 0    folic acid (FOLVITE) 1 MG tablet Take 1 tablet (1 mg total) by mouth daily. Qty: 30 tablet, Refills: 0    furosemide (LASIX) 20 MG tablet Take 1 tablet (20 mg total) by mouth daily. Qty: 30 tablet, Refills: 0    Multiple Vitamin (MULTIVITAMIN WITH MINERALS) TABS tablet Take 1 tablet by mouth daily. Qty: 30 tablet, Refills: 0    pantoprazole (PROTONIX) 40 MG tablet Take 1 tablet (40 mg total) by mouth 2 (two) times daily. Qty: 60 tablet, Refills: 0    prednisoLONE 5 MG TABS tablet Take 8 tablets (40 mg total) by mouth daily. Qty: 192 each, Refills: 0    psyllium (HYDROCIL/METAMUCIL) 95 % PACK Take 1 packet by mouth daily. Qty: 30 each, Refills: 0    spironolactone (ALDACTONE) 100 MG tablet Take 1 tablet (100 mg total) by mouth daily. Qty: 30 tablet, Refills: 0    thiamine 100 MG tablet Take 1 tablet (100 mg total) by mouth daily. Qty: 30 tablet, Refills: 0       Allergies  Allergen Reactions  . Pollen Extract    Follow-up Information    Willia Craze, NP Follow up on 01/10/2017.   Specialty:  [facility]chell Why:  at 9: 30am. Please arrive 15 minutes early for your appointment.  Follow up at the office on 12/31/16 for labs. Call Beth, RN to report daily weights Contact information: [place]  [address] [phone]            The results of significant diagnostics from this hospitalization (including imaging, microbiology, ancillary and laboratory) are listed below for reference.    Significant Diagnostic Studies: US Abdomen Complete  Result Date: 12/23/2016 CLINICAL DATA:  56 y/o F; jaundice, hyperbilirubinemia, question ascites. EXAM: ABDOMEN ULTRASOUND COMPLETE COMPARISON:  None. FINDINGS: Gallbladder: Gallbladder sludge and small stones. Negative sonographic Murphy's sign. No gallbladder wall thickening or pericholecystic fluid. Common bile duct: Diameter: 5.5 mm Liver: Diffusely increased echogenicity. No focal lesion identified. IVC: Poorly visualized. Pancreas: Poorly visualized. Spleen: Size and appearance within normal limits. Right Kidney: Length: 11.3 cm. Echogenicity within normal limits. No mass or hydronephrosis visualized. Left Kidney: Length: 11.4 cm. Echogenicity within normal limits. No mass or hydronephrosis visualized. Abdominal aorta: No aneurysm visualized. Other findings: Small volume of peritoneal ascites, predominantly perihepatic. IMPRESSION: 1. Hepatic steatosis. 2. Gallbladder sludge and small stones. No secondary signs of acute cholecystitis. 3. Small volume of peritoneal ascites, predominantly perihepatic. Electronically Signed   By: Kristine Garbe M.D.   On: 12/23/2016 19:22   Ct Abdomen Pelvis W Contrast  Result Date: 12/23/2016 CLINICAL DATA:  Jaundice for 1-1/2 months, ascites, RIGHT upper quadrant pain and weight loss. Intermittent chest pressure with bloating sensation for 3 weeks. Abnormal liver function tests, leukocytosis no intrahepatic biliary dilatation. Patent hepatopetal portal vein. EXAM: CT ABDOMEN AND PELVIS WITH CONTRAST TECHNIQUE: Multidetector CT imaging of the abdomen and pelvis was performed using the standard protocol following bolus administration of intravenous contrast. CONTRAST:  52mL ISOVUE-300 IOPAMIDOL (ISOVUE-300)  INJECTION 61% COMPARISON:  None. FINDINGS: LOWER CHEST: Small bilateral pleural effusions. Heart size is normal. No pericardial effusion. HEPATOBILIARY: The liver is at least 25 cm in cranial caudad dimension and diffusely, heterogeneously hypodense. Edema at the porta hepatis. Normal gallbladder. PANCREAS: Irregularity 13 x 19 mm hypodensity within or above the pancreatic head without ductal dilatation, pseudocyst or calcifications. SPLEEN: Normal. ADRENALS/URINARY TRACT: Kidneys are orthotopic, demonstrating symmetric enhancement. No nephrolithiasis, hydronephrosis or solid renal masses. The unopacified ureters are normal in course and caliber. Delayed imaging through the kidneys demonstrates symmetric prompt contrast excretion within the proximal urinary collecting system. Urinary bladder is partially distended and unremarkable. Normal adrenal glands. STOMACH/BOWEL: Small hiatal hernia. The stomach, small and large bowel are normal in course and caliber without inflammatory changes. Mild colonic diverticulosis. VASCULAR/LYMPHATIC: Aortoiliac vessels are normal in course and caliber, trace calcific atherosclerosis. No lymphadenopathy by CT size criteria. REPRODUCTIVE: Normal. OTHER: Large volume ascites, without flattening in the inferior vena cava. No intraperitoneal free air. Mild mesenteric edema without omental or mesenteric mass. MUSCULOSKELETAL: Mild anasarca. IMPRESSION: Severe hypodense hepatomegaly concerning for acute hepatitis. Large volume ascites. No biliary obstruction. Focal hypodensity within or above the pancreatic head, difficult to further characterize due to extensive edema at the porta hepatis. Recommend close attention on follow-up imaging. Small pleural effusions. Electronically Signed   By: Elon Alas M.D.   On: 12/23/2016 19:25   US Paracentesis  Result Date: 12/24/2016 INDICATION: Patient with ascites. Request is made for diagnostic and therapeutic paracentesis. EXAM: ULTRASOUND  GUIDED DIAGNOSTIC AND  THERAPEUTIC PARACENTESIS MEDICATIONS: 10 mL 1% lidocaine COMPLICATIONS: None immediate. PROCEDURE: Informed written consent was obtained from the patient after a discussion of the risks, benefits and alternatives to treatment. A timeout was performed prior to the initiation of the procedure. Initial ultrasound scanning demonstrates a large amount of ascites within the right lateral abdomen. The right lateral abdomen was prepped and draped in the usual sterile fashion. 1% lidocaine was used for local anesthesia. Following this, a 19 gauge, 7-cm, Yueh catheter was introduced. An ultrasound image was saved for documentation purposes. The paracentesis was performed. The catheter was removed and a dressing was applied. The patient tolerated the procedure well without immediate post procedural complication. FINDINGS: A total of approximately 2.6 liters of bright yellow fluid was removed. Samples were sent to the laboratory as requested by the clinical team. IMPRESSION: Successful ultrasound-guided paracentesis yielding 2.6 liters of peritoneal fluid. Read by:  Brynda Greathouse PA-C Electronically Signed   By: Jerilynn Mages.  Shick M.D.   On: 12/24/2016 15:46    Microbiology: Recent Results (from the past 240 hour(s))  Urine culture     Status: Abnormal   Collection Time: 12/23/16  3:48 PM  Result Value Ref Range Status   Specimen Description URINE, CLEAN CATCH  Final   Special Requests NONE  Final   Culture MULTIPLE SPECIES PRESENT, SUGGEST RECOLLECTION (A)  Final   Report Status 12/25/2016 FINAL  Final  Culture, blood (routine x 2)     Status: None (Preliminary result)   Collection Time: 12/23/16  4:30 PM  Result Value Ref Range Status   Specimen Description BLOOD LEFT FOREARM  Final   Special Requests   Final    BOTTLES DRAWN AEROBIC AND ANAEROBIC Blood Culture adequate volume   Culture   Final    NO GROWTH 4 DAYS Performed at [facility], [place] [address] [place], [place] [address]     Report Status PENDING  Incomplete  Culture, blood (routine x 2)     Status: None (Preliminary result)   Collection Time: 12/23/16  4:30 PM  Result Value Ref Range Status   Specimen Description BLOOD RIGHT FOREARM  Final   Special Requests   Final    BOTTLES DRAWN AEROBIC ONLY Blood Culture adequate volume   Culture   Final    NO GROWTH 4 DAYS Performed at [facility], [place] [address] [place], [place] [address]    Report Status PENDING  Incomplete  Culture, body fluid-bottle     Status: None (Preliminary result)   Collection Time: 12/24/16  2:30 PM  Result Value Ref Range Status   Specimen Description PERITONEAL  Final   Special Requests BOTTLES DRAWN AEROBIC AND ANAEROBIC 10CC  Final   Culture   Final    NO GROWTH 3 DAYS Performed at [facility], [place] [address] [place], [place] [address]    Report Status PENDING  Incomplete  Gram stain     Status: None   Collection Time: 12/24/16  2:30 PM  Result Value Ref Range Status   Specimen Description PERITONEAL  Final   Special Requests NONE  Final   Gram Stain   Final    FEW WBC PRESENT, PREDOMINANTLY MONONUCLEAR NO ORGANISMS SEEN Performed at [facility], [address], [place], [place] [address]    Report Status 12/24/2016 FINAL  Final  C difficile quick scan w PCR reflex     Status: None   Collection Time: 12/25/16 10:33 AM  Result  Value Ref Range Status   C Diff antigen NEGATIVE NEGATIVE Final   C Diff toxin NEGATIVE NEGATIVE Final   C Diff interpretation No C. difficile detected.  Final     Labs: Basic Metabolic Panel:  Recent Labs Lab 12/23/16 1420 12/24/16 0656 12/25/16 0654 12/26/16 0525 12/27/16 0551 12/28/16 0650  NA 119* 120* 126* 130* 130* 129*  K 2.9* 2.8* 2.7* 3.6 3.7 4.4  CL 79* 84* 94* 99* 97* 96*  CO2 24 22 23 23 25 26   GLUCOSE 126* 108* 129* 113* 98 87  BUN 8 10 10 9 10 13   CREATININE 1.30* 1.01* 0.65 0.60 0.69 0.67  CALCIUM 7.8* 7.0* 7.1* 7.5* 7.9* 7.7*  MG 1.5*  --  2.3   --   --  1.7   Liver Function Tests:  Recent Labs Lab 12/24/16 0656 12/25/16 0654 12/26/16 0525 12/27/16 0551 12/28/16 0650  AST 113* 91* 128* 128* 133*  ALT 37 34 39 46 51  ALKPHOS 234* 217* 243* 248* 241*  BILITOT 17.8* 15.1* 14.4* 12.9* 10.7*  PROT 5.1* 5.0* 4.9* 5.2* 5.0*  ALBUMIN 1.8* 1.9* 2.0* 2.0* 1.9*    Recent Labs Lab 12/23/16 1420  LIPASE 23   No results for input(s): AMMONIA in the last 168 hours. CBC:  Recent Labs Lab 12/24/16 0656 12/25/16 0654 12/26/16 0525 12/27/16 0551 12/28/16 0650  WBC 24.8* 19.5* 22.5* 26.1* 28.5*  HGB 9.4* 8.1* 8.7* 9.2* 8.9*  HCT 26.5* 23.1*  23.3* 25.0* 27.1* 26.2*  MCV 109.5* 108.5* 110.6* 112.4* 113.4*  PLT 285 291 340 336 350   Cardiac Enzymes: No results for input(s): CKTOTAL, CKMB, CKMBINDEX, TROPONINI in the last 168 hours. BNP: BNP (last 3 results) No results for input(s): BNP in the last 8760 hours.  ProBNP (last 3 results) No results for input(s): PROBNP in the last 8760 hours.  CBG: No results for input(s): GLUCAP in the last 168 hours.     SignedCristal Ford  [facility] 12/28/2016, 12:09 PM

## 2016-12-28 NOTE — Progress Notes (Signed)
Pharmacy Antibiotic Note  Mercedes Mitchell is a 56 y.o. female admitted on 12/23/2016 with abdominal pain, etoh hepatitis.  Pharmacy has been consulted for ceftriaxone dosing for GI px x 5 days.  She has been on Rocephin 2 gm IV q24 since 5/3 = 5 days.   Plan: She has completed 5 days of Rocephin 2 gm q24 ( 5/2 - 5/7) no more doses needed  Height: 5\' 6"  (167.6 cm) Weight: 113 lb 4.8 oz (51.4 kg) IBW/kg (Calculated) : 59.3  Temp (24hrs), Avg:97.8 F (36.6 C), Min:97.7 F (36.5 C), Max:97.9 F (36.6 C)   Recent Labs Lab 12/23/16 1420 12/23/16 1635 12/24/16 0656 12/25/16 0654 12/26/16 0525 12/27/16 0551  WBC 26.8*  --  24.8* 19.5* 22.5* 26.1*  CREATININE 1.30*  --  1.01* 0.65 0.60 0.69  LATICACIDVEN  --  2.01*  --   --   --   --     Estimated Creatinine Clearance: 65.2 mL/min (by C-G formula based on SCr of 0.69 mg/dL).    Allergies  Allergen Reactions  . Pollen Extract     Eudelia Bunch, Pharm.D. [phone] 12/28/2016 7:57 AM

## 2016-12-28 NOTE — Progress Notes (Signed)
Nutrition Education Note  RD received consult for pt education.  Pt was educated today on limiting sodium from the diet. Pt was provided with examples of high sodium foods and provided with names of salt-free alternatives. Pt was also educated on the importance of regular weights and limiting fluid from the diet if advised by MD. Pt was educated on the importance of eating adequate amounts of lean proteins to preserve lean muscle mass and to meet increased estimated needs given pt's current condition. Pt was given examples of healthy proteins and advised on possible supplements.    RD discussed why it is important for patient to adhere to diet recommendations, and emphasized the role of fluids, foods to avoid, and importance of weighing self daily. Teach back method used.  Expect fair compliance.  Body mass index is 18.29 kg/m. Pt meets criteria for underweight based on current BMI.  Current diet order is soft, patient is consuming approximately 25% of meals at this time. Labs and medications reviewed.  RD following this pt  Koleen Distance, RD, LDN Pager #[phone]

## 2016-12-28 NOTE — Progress Notes (Signed)
Date:  Dec 28, 2016  Chart reviewed for concurrent status and case management needs.  Will continue to follow patient progress.  Discharge Planning: following for needs  Expected discharge date: [id number]  Velva Harman, BSN, [place], [place]

## 2016-12-29 LAB — CULTURE, BODY FLUID-BOTTLE: CULTURE: NO GROWTH

## 2016-12-29 LAB — CULTURE, BODY FLUID W GRAM STAIN -BOTTLE

## 2016-12-29 LAB — ACID FAST SMEAR (AFB, MYCOBACTERIA)

## 2016-12-29 LAB — ACID FAST SMEAR (AFB): ACID FAST SMEAR - AFSCU2: NEGATIVE

## 2016-12-29 LAB — LIPASE, FLUID: Lipase-Fluid: 33 U/L

## 2016-12-31 ENCOUNTER — Other Ambulatory Visit: Payer: Self-pay | Admitting: Nurse Practitioner

## 2016-12-31 DIAGNOSIS — K7689 Other specified diseases of liver: Secondary | ICD-10-CM

## 2017-01-10 ENCOUNTER — Other Ambulatory Visit (INDEPENDENT_AMBULATORY_CARE_PROVIDER_SITE_OTHER): Payer: Managed Care, Other (non HMO)

## 2017-01-10 ENCOUNTER — Ambulatory Visit (INDEPENDENT_AMBULATORY_CARE_PROVIDER_SITE_OTHER): Payer: Managed Care, Other (non HMO) | Admitting: Nurse Practitioner

## 2017-01-10 ENCOUNTER — Ambulatory Visit (HOSPITAL_COMMUNITY): Payer: 59 | Admitting: Psychology

## 2017-01-10 ENCOUNTER — Other Ambulatory Visit: Payer: Self-pay | Admitting: *Deleted

## 2017-01-10 ENCOUNTER — Encounter: Payer: Self-pay | Admitting: Nurse Practitioner

## 2017-01-10 ENCOUNTER — Telehealth: Payer: Self-pay | Admitting: *Deleted

## 2017-01-10 VITALS — BP 90/60 | HR 89 | Ht 66.0 in | Wt 114.0 lb

## 2017-01-10 DIAGNOSIS — Z1211 Encounter for screening for malignant neoplasm of colon: Secondary | ICD-10-CM | POA: Diagnosis not present

## 2017-01-10 DIAGNOSIS — D649 Anemia, unspecified: Secondary | ICD-10-CM

## 2017-01-10 DIAGNOSIS — K7031 Alcoholic cirrhosis of liver with ascites: Secondary | ICD-10-CM | POA: Diagnosis not present

## 2017-01-10 DIAGNOSIS — K922 Gastrointestinal hemorrhage, unspecified: Secondary | ICD-10-CM

## 2017-01-10 DIAGNOSIS — F1020 Alcohol dependence, uncomplicated: Secondary | ICD-10-CM

## 2017-01-10 DIAGNOSIS — K209 Esophagitis, unspecified without bleeding: Secondary | ICD-10-CM

## 2017-01-10 LAB — CBC WITH DIFFERENTIAL/PLATELET
Basophils Absolute: 0.4 10*3/uL — ABNORMAL HIGH (ref 0.0–0.1)
Basophils Relative: 0.9 % (ref 0.0–3.0)
EOS ABS: 0.2 10*3/uL (ref 0.0–0.7)
Eosinophils Relative: 0.5 % (ref 0.0–5.0)
HEMATOCRIT: 37.6 % (ref 36.0–46.0)
Hemoglobin: 12.4 g/dL (ref 12.0–15.0)
LYMPHS PCT: 2.7 % — AB (ref 12.0–46.0)
Lymphs Abs: 1.1 10*3/uL (ref 0.7–4.0)
MCHC: 33 g/dL (ref 30.0–36.0)
MCV: 115 fl — ABNORMAL HIGH (ref 78.0–100.0)
Monocytes Absolute: 0.1 10*3/uL (ref 0.1–1.0)
Monocytes Relative: 0.2 % — ABNORMAL LOW (ref 3.0–12.0)
NEUTROS ABS: 39.8 10*3/uL — AB (ref 1.4–7.7)
Neutrophils Relative %: 95.7 % — ABNORMAL HIGH (ref 43.0–77.0)
PLATELETS: 289 10*3/uL (ref 150.0–400.0)
RBC: 3.27 Mil/uL — AB (ref 3.87–5.11)
RDW: 15.4 % (ref 11.5–15.5)

## 2017-01-10 LAB — COMPREHENSIVE METABOLIC PANEL
ALK PHOS: 355 U/L — AB (ref 39–117)
ALT: 110 U/L — AB (ref 0–35)
AST: 105 U/L — ABNORMAL HIGH (ref 0–37)
Albumin: 2.8 g/dL — ABNORMAL LOW (ref 3.5–5.2)
BILIRUBIN TOTAL: 9.5 mg/dL — AB (ref 0.2–1.2)
BUN: 15 mg/dL (ref 6–23)
CALCIUM: 8.2 mg/dL — AB (ref 8.4–10.5)
CO2: 24 mEq/L (ref 19–32)
Chloride: 99 mEq/L (ref 96–112)
Creatinine, Ser: 0.72 mg/dL (ref 0.40–1.20)
GFR: 89.49 mL/min (ref >60.00)
Glucose, Bld: 93 mg/dL (ref 70–99)
POTASSIUM: 4 meq/L (ref 3.5–5.1)
Sodium: 131 mEq/L — ABNORMAL LOW (ref 135–145)
TOTAL PROTEIN: 5.4 g/dL — AB (ref 6.0–8.3)

## 2017-01-10 LAB — PROTIME-INR
INR: 1.4 ratio — ABNORMAL HIGH (ref 0.8–1.0)
PROTHROMBIN TIME: 15.2 s — AB (ref 9.6–13.1)

## 2017-01-10 MED ORDER — ALBUMIN HUMAN 25 % IV SOLN: 25.0000 g | Freq: Once | INTRAVENOUS | Status: DC

## 2017-01-10 MED ORDER — NA SULFATE-K SULFATE-MG SULF 17.5-3.13-1.6 GM/177ML PO SOLN: 1.0000 | Freq: Once | ORAL | 0 refills | Status: AC

## 2017-01-10 NOTE — Telephone Encounter (Signed)
LM on patients cell number to advise we scheduled her US Paracentesis at [facility] radiology. Date is Tuesday 01-18-2017 at 10:00 am.  She is to arrive at 9:45 am.

## 2017-01-10 NOTE — Patient Instructions (Addendum)
Please go to the basement level to have your labs drawn.  You have been scheduled for a colonoscopy. Please follow written instructions given to you at your visit today.  Please pick up your prep supplies at the pharmacy within the next 1-3 days. [place]. If you use inhalers (even only as needed), please bring them with you on the day of your procedure. Your physician has requested that you go to www.startemmi.com and enter the access code given to you at your visit today. This web site gives a general overview about your procedure. However, you should still follow specific instructions given to you by our office regarding your preparation for the procedure.  We will call you with the date and time of the US Paracentesis.   Continue Prednisone 40 mg., Stop Diflucan.  Weight daily at home.

## 2017-01-10 NOTE — Progress Notes (Addendum)
HPI: Patient is a 56 year old female who we recently met in the hospital for evaluation of coffee ground emesis and severe alcoholic hepatitis with severe jaundice and large volume ascites.  She reported coffee ground emesis, stools were hemoccult positive. Inpatient EGD showed portal hypertensive gastropathy and severe erosive gastropathy,  likely combination of GERD and candida. She was treated with PPI and diflucan. No varices found. She underwent large volume paracentesis, no SBP. SAAG was really indeterminate based on fluid albumin being < 1.0.  She was started on Prednisolone. Here now for hospital follow up.   [name] has stopped ETOH. She feels better but deconditioned and supposed to start physical therapy.  She is watching salt in food but not so much in types of beverages she consumes. Believes weight is stable but not weighing herself. No hematemesis or black stools. Still taking diflucan and PPI. She does mention intermittent shooting pains in both sides of mid abdomen. Pain not related to eating, BMs, or movement.    Past Medical History:  Diagnosis Date  . 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  paracentesis 2.6 liters 5/4: no SBP.    Marland Kitchen Alcoholism ([place]) 12/2016  . Dysphagia 2011  . Macrocytic anemia 12/2016  . Malnutrition ([place]) 12/2016  . Panic type anxiety neurosis 2011    Patient's surgical history, family medical history, social history, medications and allergies were all reviewed in Epic    Physical Exam: BP 90/60   Pulse 89   Ht '5\' 6"'  (1.676 m)   Wt 114 lb (51.7 kg)   BMI 18.40 kg/m   GENERAL: chronically ill appearing thin white female in NAD PSYCH: :Pleasant, cooperative, normal affect EENT:  conjunctiva pink, mucous membranes moist, icteric sclera, neck supple without masses CARDIAC:  RRR, no murmur heard, 2+ BLE peripheral edema PULM: Normal respiratory effort, lungs CTA  bilaterally, no wheezing ABDOMEN:  soft, mod-largely distended, non-tender. Normal bowel sounds SKIN:  turgor, no lesions seen Musculoskeletal:  Generalized muscle wasting  NEURO: Alert and oriented x 3, no focal neurologic deficits  ASSESSMENT and PLAN:  1. 56 yo female recently admitted for severe ETOH hepatitis with jaundice, coagulopathy,  large volume ascites and coffee ground emesis. Underwent LVP (2-3 liters) No SBP. EGD revealed severe esophagitis and portal gastropathy, no varices   No labs since hospital discharge but appears much less jaundice. She could have underlying cirrhosis which might not become apparent until resolution of acute hepatitis.  -CMET, CBC, INR -her abdomen is still quite distended, she complains of abdominal pain. Will arrange for another LVP with albumin. -Fluid for cell count. For completeness will obtain cytology (not done when in hospital) -If renal function okay will increase diuretics. Currently on lasix 20 mg and Aldactone 118m. -Discussed need to monitor sodium intake in food and fluids  -daily weights. She has peripheral edema in addition to ascites. As long as renal function remains normal she should be able to tolerate 0.5 pounds off every 1-2 days -If LFTs improved will start prednisolone taper around day 28 of treatment ( 01/21/17).  -Continue to avoid ETOH -Repeat EGD in August to document healing   2. Grade D esophagitis, probably GERD + candida. Still on Diflucan and PPI.  -can stock Difulcan -continue daily PPI  3. Normocytic anemia. During recent admission hgb stayed in 8-9 range (no baseline to compare other than hgb in 2014 was 14).  -Will schedule her  for a colonoscopy to be done same time as EGD.  The risks and benefits of the procedure were discussed and the patient agrees to proceed.   Tye Savoy , NP 01/10/2017, 10:00 AM   Addendum: Reviewed and agree with management. Complex patient with severely decompensated alcoholic  hepatitis with portal hypertension ascites. Severe esophagitis. Normocytic anemia. Agree with repeat paracentesis, titration of diuretics as possible once labs reviewed Agree with EGD and colonoscopy Will need to likely taper steroids based on lab results Pyrtle, Lajuan Lines, MD

## 2017-01-11 ENCOUNTER — Encounter (HOSPITAL_COMMUNITY): Payer: Self-pay | Admitting: Psychology

## 2017-01-12 ENCOUNTER — Encounter (HOSPITAL_COMMUNITY): Payer: Self-pay | Admitting: Medical

## 2017-01-12 ENCOUNTER — Encounter (HOSPITAL_COMMUNITY): Payer: Self-pay | Admitting: Psychology

## 2017-01-12 ENCOUNTER — Other Ambulatory Visit (HOSPITAL_COMMUNITY): Payer: 59 | Attending: Psychiatry | Admitting: Psychology

## 2017-01-12 ENCOUNTER — Telehealth: Payer: Self-pay | Admitting: Nurse Practitioner

## 2017-01-12 VITALS — BP 90/60 | HR 89 | Resp 18 | Ht 66.0 in | Wt 114.0 lb

## 2017-01-12 DIAGNOSIS — D72829 Elevated white blood cell count, unspecified: Secondary | ICD-10-CM | POA: Insufficient documentation

## 2017-01-12 DIAGNOSIS — F17210 Nicotine dependence, cigarettes, uncomplicated: Secondary | ICD-10-CM | POA: Diagnosis not present

## 2017-01-12 DIAGNOSIS — E876 Hypokalemia: Secondary | ICD-10-CM | POA: Insufficient documentation

## 2017-01-12 DIAGNOSIS — K7031 Alcoholic cirrhosis of liver with ascites: Secondary | ICD-10-CM | POA: Insufficient documentation

## 2017-01-12 DIAGNOSIS — K920 Hematemesis: Secondary | ICD-10-CM | POA: Diagnosis not present

## 2017-01-12 DIAGNOSIS — K209 Esophagitis, unspecified without bleeding: Secondary | ICD-10-CM

## 2017-01-12 DIAGNOSIS — K2920 Alcoholic gastritis without bleeding: Secondary | ICD-10-CM | POA: Insufficient documentation

## 2017-01-12 DIAGNOSIS — F1020 Alcohol dependence, uncomplicated: Secondary | ICD-10-CM | POA: Diagnosis not present

## 2017-01-12 DIAGNOSIS — K7011 Alcoholic hepatitis with ascites: Secondary | ICD-10-CM | POA: Diagnosis not present

## 2017-01-12 DIAGNOSIS — D539 Nutritional anemia, unspecified: Secondary | ICD-10-CM

## 2017-01-12 DIAGNOSIS — Z87828 Personal history of other (healed) physical injury and trauma: Secondary | ICD-10-CM | POA: Insufficient documentation

## 2017-01-12 DIAGNOSIS — Z6372 Alcoholism and drug addiction in family: Secondary | ICD-10-CM | POA: Insufficient documentation

## 2017-01-12 DIAGNOSIS — F1421 Cocaine dependence, in remission: Secondary | ICD-10-CM | POA: Diagnosis not present

## 2017-01-12 DIAGNOSIS — K760 Fatty (change of) liver, not elsewhere classified: Secondary | ICD-10-CM | POA: Insufficient documentation

## 2017-01-12 DIAGNOSIS — K766 Portal hypertension: Secondary | ICD-10-CM | POA: Diagnosis not present

## 2017-01-12 DIAGNOSIS — N179 Acute kidney failure, unspecified: Secondary | ICD-10-CM | POA: Diagnosis not present

## 2017-01-12 DIAGNOSIS — B3781 Candidal esophagitis: Secondary | ICD-10-CM | POA: Insufficient documentation

## 2017-01-12 DIAGNOSIS — F1210 Cannabis abuse, uncomplicated: Secondary | ICD-10-CM

## 2017-01-12 DIAGNOSIS — K922 Gastrointestinal hemorrhage, unspecified: Secondary | ICD-10-CM | POA: Insufficient documentation

## 2017-01-12 DIAGNOSIS — E440 Moderate protein-calorie malnutrition: Secondary | ICD-10-CM | POA: Diagnosis not present

## 2017-01-12 DIAGNOSIS — E871 Hypo-osmolality and hyponatremia: Secondary | ICD-10-CM | POA: Insufficient documentation

## 2017-01-12 DIAGNOSIS — F17200 Nicotine dependence, unspecified, uncomplicated: Secondary | ICD-10-CM

## 2017-01-12 DIAGNOSIS — K2921 Alcoholic gastritis with bleeding: Secondary | ICD-10-CM
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
## 2017-01-12 NOTE — Progress Notes (Signed)
Mercedes Mitchell is a 56 y.o. female patient. CD-IOP: met with this patient this afternoon to complete her orientation and preparation for beginning the CD-IOP. She was not accompanied by her husband today as he has a Chartered certified accountant traveling with him today. The patient reported she took uber to get here and will return the same way. The documentation was reviewed and signed accordingly. The patient was pleasant and reported she was okay, but that her stomach was hurting her a little bit. She will return tomorrow and begin the CD-IOP.          Brandon Melnick, LCAS

## 2017-01-12 NOTE — Addendum Note (Signed)
Addended by: Jerene Bears on: 01/12/2017 04:48 PM   Modules accepted: Level of Service
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
## 2017-01-13 ENCOUNTER — Other Ambulatory Visit (HOSPITAL_COMMUNITY): Payer: 59 | Admitting: Psychology

## 2017-01-13 ENCOUNTER — Encounter (HOSPITAL_COMMUNITY): Payer: Self-pay | Admitting: Psychology

## 2017-01-13 ENCOUNTER — Telehealth: Payer: Self-pay | Admitting: Nurse Practitioner

## 2017-01-13 DIAGNOSIS — F1020 Alcohol dependence, uncomplicated: Secondary | ICD-10-CM | POA: Diagnosis not present

## 2017-01-13 DIAGNOSIS — F1421 Cocaine dependence, in remission: Secondary | ICD-10-CM

## 2017-01-13 DIAGNOSIS — F1210 Cannabis abuse, uncomplicated: Secondary | ICD-10-CM

## 2017-01-13 NOTE — Telephone Encounter (Addendum)
Combined this with the telephone note of 01/12/17

## 2017-01-13 NOTE — Telephone Encounter (Signed)
Mercedes Mitchell routed conversation to You 7 hours ago (8:21 AM)    Mercedes Mitchell [phone]  [name] 7 hours ago (8:16 AM)    Incoming call:  pt states that at [facility] on 5.21.18 [name] was going to prescribe her a medication besides the prep for colon and she hasn't recevied it yet and pt states she needs medication prednisoLONE refilled

## 2017-01-14 ENCOUNTER — Other Ambulatory Visit: Payer: Self-pay

## 2017-01-14 ENCOUNTER — Encounter (HOSPITAL_COMMUNITY): Payer: Self-pay | Admitting: Psychology

## 2017-01-14 DIAGNOSIS — K7031 Alcoholic cirrhosis of liver with ascites: Secondary | ICD-10-CM

## 2017-01-14 MED ORDER — PREDNISOLONE 15 MG/5ML PO SOLN: ORAL | 0 refills | Status: DC

## 2017-01-14 MED ORDER — PREDNISOLONE 5 MG PO TABS: ORAL_TABLET | ORAL | 0 refills | Status: DC

## 2017-01-14 NOTE — Telephone Encounter (Signed)
Left message for the patient to call back. I have spoken with the [facility] pharmacist. Patient had Prednisolone solution previously for her Rx given 12/29/16. Does she want solution again? Also she has a new appointment date for follow up here.

## 2017-01-14 NOTE — Telephone Encounter (Signed)
Patient wants the Prednisolone solution. Discussed instructions for her taper, goal of 1/2 pound decrease of her daily weight and follow up.

## 2017-01-14 NOTE — Progress Notes (Signed)
Mercedes Mitchell is a 56 y.o. female patient. CD-IOP: Failed Collection. A drug test was given to the patient this afternoon, but she was unable to provide a sample. Her urine production is very low right now and she was not able to urinate. She slipped on the toilet and I helped her get up. She is very weak and needs help getting out of the group room chair. She informed me that her kidneys had stopped working when she went to the hospital, but fortunately, they are repairing themselves, but it just takes time to produce urine. She agreed that when she returns next Wednesday for group, she will be ready to provide a sample.        Brandon Melnick, LCAS

## 2017-01-14 NOTE — Telephone Encounter (Signed)
Called to patient. No answer. Left information on her voicemail.

## 2017-01-14 NOTE — Telephone Encounter (Signed)
I called patient to discuss results. Beth, will you put in orders for CMET CBC, and INR for Tuesday 6/1. Please get her a follow up appointment with me in about 3 weeks. Please call in a refill on her prednisolone sig: as directed because I am going to give her a tapering schedule ( I will call her myself on Tuesday). Please remind her to weight daily.  Thanks

## 2017-01-14 NOTE — Progress Notes (Signed)
    Daily Group Progress Note  Program: CD-IOP   01/14/2017 Mercedes Mitchell [id number]  Diagnosis:  Alcohol use disorder, severe, dependence ([place])  Cannabis abuse, episodic use  Cocaine use disorder, severe, in early remission, dependence ([place])   Sobriety Date: 5/3  Group Time: 1-2:30  Participation Level: Minimal  Behavioral Response: Appropriate and Drowsy  Type of Therapy: Process Group  Interventions: CBT and Motivational Interviewing  Topic: Patients were somewhat active and disengaged in todays session. Pts checked in with their highlights and challenges over the past 24 hours and but many were more muted or reserved in their sharing. Counselor also utilized an activity to generate topical discussion about recovery. Counselor led a brief 3 min mindfulness exercise to help pts create an intention for their work in group today and focus on their breathing.     Group Time: 2:30-4  Participation Level: Minimal  Behavioral Response: Appropriate and Drowsy  Type of Therapy: Psycho-education Group  Interventions: CBT, Assertiveness Training and Meditation: mindfulness  Topic: Patients were active and engaged in todays session about boundaries. The topic of boundaries sparked a specific conversation about a patients recent argument with his s/o which led the group to discuss his life in more detail than they had heard previously. The group commented that they appreciated his sharing and wanted him to share more often. A UDS sample was collected from a member today since her results from Monday were "damaged in transit" and unable to be interpreted. Counselors also spent significant time discussing the importance of planning for the upcoming holiday weekend for [facility] day. All group members were asked to share their specific plans. Counselors also checked in on their homework from yesterday when pts were asked to discuss their recovery with their family  members.   Summary: Patient was somewhat disengaged from conversation and appeared physically exhausted, drowsy, and had difficulty concentrating when she was not talking. She was able and very willing to share about her topic of "holidays in recovery" and talked openly about her hx of "ruining holiday parties by getting too drunk". She admitted she loved to host but had controlling tendencies and did not allow anyone else to help her host. She plans to completely change her holidays this year by staying home alone with her husband and having a relaxing time. She plans to direct family and friends who expect her to party to ask another family member to host parties this year. She reports she did not attend any meetings since it was her wedding anniversary yesterday. She hopes to attend 2 meetings this weekend. Pt was made as comfortable as possible with her legs propped up due to fluid build up. She was given a UDS but was unable to provide a sample due to recent kidney problems and only going "a few times / day". She was excused from the test and asked to be prepared each day next week to give a UDS sample.   UDS collected: No Results: pending  AA/NA attended?: No  Sponsor?: No   Youlanda Roys, LPCA 01/14/2017 8:26 AM

## 2017-01-18 ENCOUNTER — Ambulatory Visit (HOSPITAL_COMMUNITY): Admission: RE | Admit: 2017-01-18 | Payer: Managed Care, Other (non HMO) | Source: Ambulatory Visit

## 2017-01-18 ENCOUNTER — Other Ambulatory Visit (INDEPENDENT_AMBULATORY_CARE_PROVIDER_SITE_OTHER): Payer: Managed Care, Other (non HMO)

## 2017-01-18 ENCOUNTER — Telehealth: Payer: Self-pay

## 2017-01-18 ENCOUNTER — Other Ambulatory Visit: Payer: Self-pay

## 2017-01-18 ENCOUNTER — Ambulatory Visit (HOSPITAL_COMMUNITY)
Admission: RE | Admit: 2017-01-18 | Discharge: 2017-01-18 | Disposition: A | Discharge status: Home / Self Care | Payer: Managed Care, Other (non HMO) | Source: Ambulatory Visit | Attending: Nurse Practitioner | Admitting: Nurse Practitioner

## 2017-01-18 DIAGNOSIS — D649 Anemia, unspecified: Secondary | ICD-10-CM | POA: Diagnosis not present

## 2017-01-18 DIAGNOSIS — K7031 Alcoholic cirrhosis of liver with ascites: Secondary | ICD-10-CM

## 2017-01-18 DIAGNOSIS — K7011 Alcoholic hepatitis with ascites: Secondary | ICD-10-CM | POA: Diagnosis present

## 2017-01-18 DIAGNOSIS — K7689 Other specified diseases of liver: Secondary | ICD-10-CM

## 2017-01-18 DIAGNOSIS — K922 Gastrointestinal hemorrhage, unspecified: Secondary | ICD-10-CM | POA: Diagnosis not present

## 2017-01-18 DIAGNOSIS — D72829 Elevated white blood cell count, unspecified: Secondary | ICD-10-CM

## 2017-01-18 LAB — CBC WITH DIFFERENTIAL/PLATELET
BASOS PCT: 0.1 % (ref 0.0–3.0)
Basophils Absolute: 0 10*3/uL (ref 0.0–0.1)
EOS PCT: 0.9 % (ref 0.0–5.0)
Eosinophils Absolute: 0.3 10*3/uL (ref 0.0–0.7)
HEMATOCRIT: 40.1 % (ref 36.0–46.0)
HEMOGLOBIN: 13.4 g/dL (ref 12.0–15.0)
LYMPHS PCT: 5.2 % — AB (ref 12.0–46.0)
Lymphs Abs: 1.9 10*3/uL (ref 0.7–4.0)
MCHC: 33.5 g/dL (ref 30.0–36.0)
MCV: 112.5 fl — AB (ref 78.0–100.0)
MONOS PCT: 1.1 % — AB (ref 3.0–12.0)
Monocytes Absolute: 0.4 10*3/uL (ref 0.1–1.0)
NEUTROS ABS: 34.7 10*3/uL — AB (ref 1.4–7.7)
Neutrophils Relative %: 92.7 % — ABNORMAL HIGH (ref 43.0–77.0)
PLATELETS: 336 10*3/uL (ref 150.0–400.0)
RBC: 3.57 Mil/uL — ABNORMAL LOW (ref 3.87–5.11)
RDW: 14.7 % (ref 11.5–15.5)

## 2017-01-18 LAB — COMPREHENSIVE METABOLIC PANEL
ALT: 106 U/L — ABNORMAL HIGH (ref 0–35)
AST: 79 U/L — ABNORMAL HIGH (ref 0–37)
Albumin: 2.9 g/dL — ABNORMAL LOW (ref 3.5–5.2)
Alkaline Phosphatase: 405 U/L — ABNORMAL HIGH (ref 39–117)
BUN: 17 mg/dL (ref 6–23)
CHLORIDE: 99 meq/L (ref 96–112)
CO2: 22 meq/L (ref 19–32)
Calcium: 8.6 mg/dL (ref 8.4–10.5)
Creatinine, Ser: 0.66 mg/dL (ref 0.40–1.20)
GFR: 98.94 mL/min (ref >60.00)
GLUCOSE: 79 mg/dL (ref 70–99)
POTASSIUM: 4.4 meq/L (ref 3.5–5.1)
Sodium: 131 mEq/L — ABNORMAL LOW (ref 135–145)
TOTAL PROTEIN: 5.7 g/dL — AB (ref 6.0–8.3)
Total Bilirubin: 9.7 mg/dL — ABNORMAL HIGH (ref 0.2–1.2)

## 2017-01-18 LAB — BODY FLUID CELL COUNT WITH DIFFERENTIAL
Lymphs, Fluid: 22 %
MONOCYTE-MACROPHAGE-SEROUS FLUID: 21 % — AB (ref 50–90)
NEUTROPHIL FLUID: 57 % — AB (ref 0–25)
Total Nucleated Cell Count, Fluid: 45 cu mm (ref 0–1000)

## 2017-01-18 LAB — PROTIME-INR
INR: 1.6 ratio — ABNORMAL HIGH (ref 0.8–1.0)
Prothrombin Time: 17.5 s — ABNORMAL HIGH (ref 9.6–13.1)

## 2017-01-18 NOTE — Telephone Encounter (Signed)
Referral in epic for hematology at the [facility]. Referral faxed to [facility] for opinion regarding liver disease and management. Records faxed also.

## 2017-01-18 NOTE — Procedures (Signed)
Ultrasound-guided diagnostic and therapeutic paracentesis performed yielding 4 liters (maximum ordered) of yellow fluid. No immediate complications.  A portion of the fluid was submitted to the lab for preordered studies. 

## 2017-01-18 NOTE — Telephone Encounter (Signed)
-----   Message from Jerene Bears, MD sent at 01/18/2017  1:26 PM EDT ----- Regarding: RE: labs Paula/Kamyra Schroeck I have reviewed her labs. Paracentesis today negative for spontaneous bacterial peritonitis That said her leukocytosis is unexplained and more than I would expect for steroids alone LFTs remain elevated the pattern has changed. No longer AST predominant Vaughan Basta please arrange referrals as soon as possible.  Thank you  I would recommend the following --  1. Urgent referral to [facility]. I'm not requesting that they completely assume care but rather provide their opinion regarding her liver disease and management thereof. Hopefully they can see her fairly quickly in their [place] office 2. Hematology referral for profound leukocytosis 3. I would recommend that we begin to wean steroids over 2-4 weeks with close monitoring of liver enzymes and INR 4. Low-sodium diet and diuretic management of ascites 5. Close office follow-up  JMP  ----- Message ----- From: Willia Craze, NP Sent: 01/17/2017  12:35 AM To: Jerene Bears, MD Subject: labs                                           Ulice Dash,  Happy [birth year] Birthday !!! Have you made it to 41 yet??  FYI.Marland KitchenMarland KitchenHer LFTs aren't improving, really a little worse but she looked better when I saw her in clinic last week. Starting the 16 day prednisolone taper on Tuesday, getting paracentesis same day. .   Her WBC is really high, more than what I would expect with the steroids.  Having said that she has no fevers or bands to suggests acute infection.   Any thoughts?  Thanks,  PG     .

## 2017-01-18 NOTE — Telephone Encounter (Signed)
Received call from [place] regarding [place] para. Radiology wanting to clarify albumin orders. At first they could not see the orders, then state they need more specific orders. Checked with Tye Savoy and they are to stop the para after removing 4 liters and she does not need any albumin per Tye Savoy NP. Order given over the phone to [place].

## 2017-01-19 ENCOUNTER — Telehealth (HOSPITAL_COMMUNITY): Payer: Self-pay | Admitting: Psychology

## 2017-01-19 ENCOUNTER — Encounter (HOSPITAL_COMMUNITY): Payer: Self-pay | Admitting: Emergency Medicine

## 2017-01-19 ENCOUNTER — Other Ambulatory Visit (HOSPITAL_COMMUNITY): Payer: 59

## 2017-01-19 ENCOUNTER — Encounter (HOSPITAL_COMMUNITY): Payer: Self-pay | Admitting: Psychology

## 2017-01-19 ENCOUNTER — Telehealth: Payer: Self-pay | Admitting: Nurse Practitioner

## 2017-01-19 ENCOUNTER — Emergency Department (HOSPITAL_COMMUNITY): Payer: Managed Care, Other (non HMO)

## 2017-01-19 ENCOUNTER — Emergency Department (HOSPITAL_COMMUNITY)
Admission: EM | Admit: 2017-01-19 | Discharge: 2017-01-19 | Disposition: A | Discharge status: Home / Self Care | Payer: Managed Care, Other (non HMO) | Attending: Emergency Medicine | Admitting: Emergency Medicine

## 2017-01-19 DIAGNOSIS — S0181XA Laceration without foreign body of other part of head, initial encounter: Secondary | ICD-10-CM | POA: Insufficient documentation

## 2017-01-19 DIAGNOSIS — Y939 Activity, unspecified: Secondary | ICD-10-CM | POA: Insufficient documentation

## 2017-01-19 DIAGNOSIS — Y999 Unspecified external cause status: Secondary | ICD-10-CM | POA: Insufficient documentation

## 2017-01-19 DIAGNOSIS — F17210 Nicotine dependence, cigarettes, uncomplicated: Secondary | ICD-10-CM | POA: Diagnosis not present

## 2017-01-19 DIAGNOSIS — Y929 Unspecified place or not applicable: Secondary | ICD-10-CM | POA: Insufficient documentation

## 2017-01-19 DIAGNOSIS — S0083XA Contusion of other part of head, initial encounter: Secondary | ICD-10-CM

## 2017-01-19 DIAGNOSIS — W010XXA Fall on same level from slipping, tripping and stumbling without subsequent striking against object, initial encounter: Secondary | ICD-10-CM

## 2017-01-19 DIAGNOSIS — W0110XA Fall on same level from slipping, tripping and stumbling with subsequent striking against unspecified object, initial encounter: Secondary | ICD-10-CM | POA: Diagnosis not present

## 2017-01-19 DIAGNOSIS — S0990XA Unspecified injury of head, initial encounter: Secondary | ICD-10-CM | POA: Diagnosis present

## 2017-01-19 LAB — ALBUMIN, FLUID (OTHER): ALBUMIN, BODY FLUID OTHER: 0.5 g/dL

## 2017-01-19 MED ORDER — LIDOCAINE-EPINEPHRINE (PF) 2 %-1:200000 IJ SOLN
10.0000 mL | Freq: Once | INTRAMUSCULAR | Status: AC
  Administered 2017-01-19: 10 mL
  Filled 2017-01-19: qty 20

## 2017-01-19 MED ORDER — TETANUS-DIPHTH-ACELL PERTUSSIS 5-2.5-18.5 LF-MCG/0.5 IM SUSP
0.5000 mL | Freq: Once | INTRAMUSCULAR | Status: AC
  Administered 2017-01-19: 0.5 mL via INTRAMUSCULAR
  Filled 2017-01-19: qty 0.5

## 2017-01-19 NOTE — Discharge Instructions (Signed)
It was our pleasure to provide your ER care today - we hope that you feel better.  Keep your head elevated to help minimize pain and swelling to area.   Ice/coldpack to sore area.  Keep wounds very clean.  Have sutures removed, your doctor or urgent care, in 1 week.   Return to ER right away if worse, new or severe pain, severe headache, infection of wound, other concern.

## 2017-01-19 NOTE — ED Notes (Signed)
Patient is alert and oriented x3.  She was given DC instructions and follow up visit instructions.  Patient gave verbal understanding. She was DC ambulatory under her own power to home.  V/S stable.  He was not showing any signs of distress on DC 

## 2017-01-19 NOTE — ED Triage Notes (Signed)
Pt fell, tripping over cane and hit head on concrete floor. Pt with lac to forehead, bleeding controlled, dressing applied. Pt c/o headache.

## 2017-01-19 NOTE — ED Provider Notes (Signed)
[facility] DEPT Provider Note   CSN: [id number] Arrival date & time: 01/19/17  1026     History   Chief Complaint Chief Complaint  Patient presents with  . Fall  . Head Injury  . Head Laceration    HPI Mercedes Mitchell is a 56 y.o. female.  Patient c/o fall, and head injury earlier today, approximately 1-2 hours ago.  Pt indicates she tripped over her cane. Fell forward, hit head. Contusion and laceration to forehead. No loc. Pain to head, moderate, constant. No neck or back pain. No vomiting. No numbness/weakness. States recent health at her baseline. Abrasion to right knee. Tetanus 10 yrs ago.   Denies other pain or injury.  +hx etoh liver disease/cirrhosis.    The history is provided by the patient.  Fall  Associated symptoms include headaches. Pertinent negatives include no chest pain, no abdominal pain and no shortness of breath.  Head Injury   Pertinent negatives include no numbness, no vomiting and no weakness.  Head Laceration  Associated symptoms include headaches. Pertinent negatives include no chest pain, no abdominal pain and no shortness of breath.    Past Medical History:  Diagnosis Date  . 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  paracentesis 2.6 liters 5/4: no SBP.    Marland Kitchen Alcoholism ([place]) 12/2016  . Dysphagia 2011  . Macrocytic anemia 12/2016  . Malnutrition ([place]) 12/2016  . Panic type anxiety neurosis 2011    Patient Active Problem List   Diagnosis Date Noted  . Alcoholic cirrhosis of liver with ascites ([place]) 01/12/2017  . GI bleeding 01/12/2017  . Current every day smoker 01/12/2017  . Ascites   . Coffee ground emesis   . Acute esophagitis   . Protein-calorie malnutrition, severe 12/24/2016  . Acute alcoholic hepatitis 40/34/7425  . ETOH abuse 12/23/2016  . Macrocytic anemia 12/21/2016  . 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 DYSPHAGIA 03/25/2010    Past Surgical  History:  Procedure Laterality Date  . APPENDECTOMY    . ESOPHAGOGASTRODUODENOSCOPY N/A 12/25/2016   Procedure: ESOPHAGOGASTRODUODENOSCOPY (EGD);  Surgeon: Jerene Bears, MD;  Location: [facility];  Service: Endoscopy;  Laterality: N/A;    OB History    No data available       Home Medications    Prior to Admission medications   Medication Sig Start Date End Date Taking? Authorizing Provider  feeding supplement, ENSURE ENLIVE, (ENSURE ENLIVE) LIQD Take 237 mLs by mouth 3 (three) times daily between meals. 12/28/16   Mikhail, Velta Addison, DO  fluconazole (DIFLUCAN) 200 MG tablet Take 1 tablet (200 mg total) by mouth daily. 12/29/16   Mikhail, Velta Addison, DO  folic acid (FOLVITE) 1 MG tablet Take 1 tablet (1 mg total) by mouth daily. 12/29/16   Mikhail, Velta Addison, DO  furosemide (LASIX) 20 MG tablet Take 1 tablet (20 mg total) by mouth daily. 12/29/16   Cristal Ford, DO  Multiple Vitamin (MULTIVITAMIN WITH MINERALS) TABS tablet Take 1 tablet by mouth daily. 12/29/16   Mikhail, Velta Addison, DO  pantoprazole (PROTONIX) 40 MG tablet Take 1 tablet (40 mg total) by mouth 2 (two) times daily. 12/28/16   Mikhail, Velta Addison, DO  prednisoLONE (PRELONE) 15 MG/5ML SOLN 40mg  daily until 01/21/17 then taper down by 10mg  daily for 4 days. At 10mg  taper by 5mg  daily then 5 mg daily for 3 days 01/14/17   Willia Craze, NP  psyllium (HYDROCIL/METAMUCIL) 95 % PACK Take 1 packet by mouth  daily. 12/29/16   Cristal Ford, DO  spironolactone (ALDACTONE) 100 MG tablet Take 1 tablet (100 mg total) by mouth daily. 12/29/16   Cristal Ford, DO  thiamine 100 MG tablet Take 1 tablet (100 mg total) by mouth daily. 12/29/16   Cristal Ford, DO    Family History Family History  Problem Relation Age of Onset  . Hyperlipidemia Father   . Heart disease Father     Social History Social History  Substance Use Topics  . Smoking status: Current Every Day Smoker    Types: Cigarettes  . Smokeless tobacco: Never Used  . Alcohol use Yes      Allergies   Pollen extract   Review of Systems Review of Systems  Constitutional: Negative for fever.  HENT: Negative for nosebleeds.   Eyes: Negative for visual disturbance.  Respiratory: Negative for shortness of breath.   Cardiovascular: Negative for chest pain.  Gastrointestinal: Negative for abdominal pain and vomiting.  Genitourinary: Negative for flank pain.  Musculoskeletal: Negative for back pain and neck pain.  Skin: Positive for wound.  Neurological: Positive for headaches. Negative for weakness and numbness.  Hematological: Does not bruise/bleed easily.  Psychiatric/Behavioral: Negative for confusion.     Physical Exam Updated Vital Signs BP 119/81 (BP Location: Left Arm)   Pulse (!) 106   Temp 97.5 F (36.4 C) (Oral)   Resp 16   Ht 1.676 m (5\' 6" )   Wt 47.6 kg (105 lb)   SpO2 100%   BMI 16.95 kg/m   Physical Exam  Constitutional: She appears well-developed and well-nourished. No distress.  HENT:  Large contusion to forehead, with 3.5 cm laceration. No fb seen or felt.   Eyes: Conjunctivae and EOM are normal. Pupils are equal, round, and reactive to light. No scleral icterus.  Neck: Normal range of motion. Neck supple. No tracheal deviation present.  Cardiovascular: Normal rate, regular rhythm, normal heart sounds and intact distal pulses.   Pulmonary/Chest: Effort normal and breath sounds normal. No respiratory distress. She exhibits no tenderness.  Abdominal: Soft. Normal appearance. She exhibits no distension. There is no tenderness.  Musculoskeletal:  Abrasion to right knee. Good rom bil extremities without focal bony tenderness. bil ankle edema - chr per patient.   Neurological: She is alert.  Motor intact bil, stre 5/5. sens grossly intact.   Skin: Skin is warm and dry. No rash noted.  Psychiatric: She has a normal mood and affect.  Nursing note and vitals reviewed.    ED Treatments / Results  Labs (all labs ordered are listed, but only  abnormal results are displayed) Ct Head Wo Contrast  Result Date: 01/19/2017 CLINICAL DATA:  Status post fall.  Laceration to the forehead. EXAM: CT HEAD WITHOUT CONTRAST TECHNIQUE: Contiguous axial images were obtained from the base of the skull through the vertex without intravenous contrast. COMPARISON:  None. FINDINGS: Brain: No evidence of acute infarction, hemorrhage, extra-axial collection, ventriculomegaly, or mass effect. Generalized cerebral atrophy. Periventricular white matter low attenuation likely secondary to microangiopathy. Vascular: Cerebrovascular atherosclerotic calcifications are noted. Skull: Negative for fracture or focal lesion. Sinuses/Orbits: Visualized portions of the orbits are unremarkable. Visualized portions of the paranasal sinuses and mastoid air cells are unremarkable. Other: None. IMPRESSION: No acute intracranial pathology. Electronically Signed   By: Kathreen Devoid   On: 01/19/2017 11:48     EKG  EKG Interpretation None       Radiology  Procedures .Marland KitchenLaceration Repair Date/Time: 01/19/2017 1:19 PM Performed by: Lajean Saver Authorized by:  Lajean Saver   Consent:    Consent obtained:  Verbal   Consent given by:  Patient Anesthesia (see MAR for exact dosages):    Anesthesia method:  Local infiltration   Local anesthetic:  Lidocaine 2% WITH epi Laceration details:    Location:  Face   Face location:  Forehead   Length (cm):  3.5 Pre-procedure details:    Preparation:  Patient was prepped and draped in usual sterile fashion Treatment:    Area cleansed with:  Betadine   Amount of cleaning: moderate.   Irrigation solution:  Sterile water   Visualized foreign bodies/material removed: no   Skin repair:    Repair method:  Sutures Approximation:    Vermilion border: well-aligned   Post-procedure details:    Patient tolerance of procedure:  Tolerated well, no immediate complications   (including critical care time)  Medications Ordered in  ED Medications - No data to display   Initial Impression / Assessment and Plan / ED Course  I have reviewed the triage vital signs and the nursing notes.  Pertinent labs & imaging results that were available during my care of the patient were reviewed by me and considered in my medical decision making (see chart for details).  CT.  Reviewed nursing notes and prior charts for additional history.   Sutured wound.   Tetanus IM.  Recheck spine nt.      Final Clinical Impressions(s) / ED Diagnoses   Final diagnoses:  None    New Prescriptions New Prescriptions   No medications on file     Lajean Saver, MD 01/19/17 1321

## 2017-01-19 NOTE — Telephone Encounter (Signed)
Patient hasn't taken her medications yet because of her fall. She has not missed any day of medication. She is at home and she has eaten. She will take her medications.

## 2017-01-20 ENCOUNTER — Encounter (HOSPITAL_COMMUNITY): Payer: Self-pay | Admitting: Psychology

## 2017-01-20 ENCOUNTER — Other Ambulatory Visit: Payer: Self-pay

## 2017-01-20 ENCOUNTER — Telehealth: Payer: Self-pay

## 2017-01-20 ENCOUNTER — Other Ambulatory Visit (HOSPITAL_COMMUNITY): Payer: 59

## 2017-01-20 MED ORDER — PREDNISOLONE 15 MG/5ML PO SOLN: ORAL | 0 refills | Status: DC

## 2017-01-20 NOTE — Telephone Encounter (Signed)
-----   Message from Algernon Huxley, RN sent at 01/20/2017  2:44 PM EDT -----  Per Tye Savoy NP pt should decrease prednisone by 10mg  for 5 days and then by 5mg  every 5 days.   ----- Message ----- From: Algernon Huxley, RN Sent: 01/20/2017  12:28 PM To: Willia Craze, NP  Do I need to inform her of the new taper instructions? ----- Message ----- From: Willia Craze, NP Sent: 01/20/2017  12:21 PM To: Algernon Huxley, RN  [name],  Patient was advised to start quick steroid taper tomorrow. Since her labs haven't shown much improvement we are going to need to taper slowly over 3-4 weeks instead. Can you please let patient know that the tapering schedule is changing (I had already given her the 16 day taper).  Hopefully she will see Hepatology soon. Thanks

## 2017-01-20 NOTE — Telephone Encounter (Signed)
Spoke with Mercedes Mitchell and she is aware of taper. Mercedes Mitchell wants to know what [name] would suggest she take for her headache. She fell yesterday and has stitches in her head and it is bothering her. Please advise.

## 2017-01-20 NOTE — Telephone Encounter (Signed)
Pt scheduled to see [facility] 01/25/17@3pm .

## 2017-01-20 NOTE — Telephone Encounter (Signed)
Left message for pt to call back  °

## 2017-01-21 ENCOUNTER — Telehealth (HOSPITAL_COMMUNITY): Payer: Self-pay | Admitting: Psychology

## 2017-01-21 ENCOUNTER — Encounter (HOSPITAL_COMMUNITY): Payer: Self-pay | Admitting: Psychology

## 2017-01-21 NOTE — Progress Notes (Signed)
Mercedes Mitchell is a 56 y.o. female patient. CD-IOP: Absent from group today. The patient did not appear for group today. She phoned and left message stating that she hd fallen on the way to the [place] meeting and cut her head. She called from [facility] where she was being treated. The patient ended up with eight stitches to address the laceration on her head. She will not be expected in group today.         Brandon Melnick, LCAS

## 2017-01-21 NOTE — Telephone Encounter (Signed)
Spoke with pt and she is aware. New script called to pharmacy for taper.

## 2017-01-21 NOTE — Progress Notes (Signed)
Mercedes Mitchell is a 56 y.o. female patient. CD-IOP: Counselor spoke with the patient this morning. She had phoned earlier and reported she was feeling a little better, but her shoulder is still very sore and she remains very weak. The patient reported she has an appointment with a liver specialist on Tuesday and will have her stitches removed on Wednesday. She fell last Thursday morning entering an AA meeting and took a bad fall. She reported she had secured a good walker and will not hesitate to use that for the time being. The patient and I agreed that she should take the next week to rest and regain some of her strength. She can return and begin the CD-IOP again on Monday, June 11. We agreed to speak with each other next week, but the patient seemed very relieved to be able to return to the CD-IOP and she reported she had found the sessions very helpful and informative. We will wait for her to return to the program on Monday, June 11.         Brandon Melnick, LCAS

## 2017-01-21 NOTE — Telephone Encounter (Signed)
I called and left her a message. Normally shouldn't exceed 2 grams with liver disease but she is acute and don't want to add insult to injury. If must take something then no more than 500mg  tablet of tylenol in a 24 hour period for now.

## 2017-01-21 NOTE — Progress Notes (Signed)
Mercedes Mitchell is a 56 y.o. female patient. CD-IOP: Absent from group. The patient called this counselor and explained she was very sore today after the fall yesterday morning and was resting in bed. She admitted she was surprised at how weak she felt. We agreed it would be best for her to rest today and perhaps return to the group next week.         Brandon Melnick, LCAS

## 2017-01-24 ENCOUNTER — Other Ambulatory Visit (HOSPITAL_COMMUNITY): Payer: 59 | Admitting: Psychology

## 2017-01-26 ENCOUNTER — Other Ambulatory Visit (HOSPITAL_COMMUNITY): Payer: 59 | Admitting: Medical

## 2017-01-26 ENCOUNTER — Telehealth: Payer: Self-pay | Admitting: Nurse Practitioner

## 2017-01-26 ENCOUNTER — Other Ambulatory Visit (HOSPITAL_COMMUNITY): Payer: Self-pay | Admitting: Nurse Practitioner

## 2017-01-26 ENCOUNTER — Emergency Department (HOSPITAL_COMMUNITY)
Admission: EM | Admit: 2017-01-26 | Discharge: 2017-01-26 | Disposition: A | Discharge status: Home / Self Care | Payer: Managed Care, Other (non HMO) | Attending: Emergency Medicine | Admitting: Emergency Medicine

## 2017-01-26 ENCOUNTER — Encounter (HOSPITAL_COMMUNITY): Payer: Self-pay | Admitting: Family Medicine

## 2017-01-26 ENCOUNTER — Encounter (HOSPITAL_COMMUNITY): Payer: Self-pay | Admitting: Medical

## 2017-01-26 DIAGNOSIS — K7010 Alcoholic hepatitis without ascites: Secondary | ICD-10-CM

## 2017-01-26 DIAGNOSIS — R7989 Other specified abnormal findings of blood chemistry: Secondary | ICD-10-CM

## 2017-01-26 DIAGNOSIS — Z4802 Encounter for removal of sutures: Secondary | ICD-10-CM | POA: Insufficient documentation

## 2017-01-26 DIAGNOSIS — Z79899 Other long term (current) drug therapy: Secondary | ICD-10-CM | POA: Diagnosis not present

## 2017-01-26 DIAGNOSIS — R945 Abnormal results of liver function studies: Principal | ICD-10-CM

## 2017-01-26 DIAGNOSIS — Z87891 Personal history of nicotine dependence: Secondary | ICD-10-CM | POA: Insufficient documentation

## 2017-01-26 MED ORDER — TRAZODONE HCL 50 MG PO TABS: ORAL_TABLET | ORAL | 0 refills | Status: DC

## 2017-01-26 MED ORDER — BACITRACIN ZINC 500 UNIT/GM EX OINT: 1.0000 "application " | TOPICAL_OINTMENT | Freq: Three times a day (TID) | CUTANEOUS | 1 refills | Status: DC

## 2017-01-26 NOTE — ED Notes (Signed)
Provided a warm blanket.

## 2017-01-26 NOTE — Telephone Encounter (Signed)
Spoke with the patient. Encouraged her to call her PCP and discuss options to get back into a normal sleep pattern. She thanks me for my call and says she will contact the PCP.

## 2017-01-26 NOTE — Discharge Instructions (Signed)
Please use bacitracin ointment for about 2 weeks. Then Mederma scar cream. Use sun protection.

## 2017-01-26 NOTE — ED Triage Notes (Signed)
Patient needs suture removed on medial fore ahead.

## 2017-01-26 NOTE — Telephone Encounter (Signed)
I had recommended she call her PCP about her insomnia. She is saying she sleeps an hour and is then awake again. Please advise.

## 2017-01-26 NOTE — ED Provider Notes (Signed)
[facility] DEPT Provider Note   CSN: [id number] Arrival date & time: 01/26/17  1855     History   Chief Complaint Chief Complaint  Patient presents with  . Suture / Staple Removal    HPI Mercedes Mitchell is a 56 y.o. female.  Mercedes Mitchell is a 56 y.o. Female who presents to the emergency department for suture removal. Patient had sutures placed 7 days ago after a trip and fall. She reports the incision is been healing well. No evidence of infection. No fevers. No pain. She denies any complaints at this time.   The history is provided by the patient and medical records. No language interpreter was used.  Suture / Staple Removal     Past Medical History:  Diagnosis Date  . 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  paracentesis 2.6 liters 5/4: no SBP.    Marland Kitchen Alcoholism ([place]) 12/2016  . Dysphagia 2011  . Macrocytic anemia 12/2016  . Malnutrition ([place]) 12/2016  . Panic type anxiety neurosis 2011    Patient Active Problem List   Diagnosis Date Noted  . Alcoholic cirrhosis of liver with ascites ([place]) 01/12/2017  . GI bleeding 01/12/2017  . Current every day smoker 01/12/2017  . Ascites   . Coffee ground emesis   . Acute esophagitis   . Protein-calorie malnutrition, severe 12/24/2016  . Acute alcoholic hepatitis 40/98/1191  . ETOH abuse 12/23/2016  . Macrocytic anemia 12/21/2016  . 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 DYSPHAGIA 03/25/2010    Past Surgical History:  Procedure Laterality Date  . ESOPHAGOGASTRODUODENOSCOPY N/A 12/25/2016   Procedure: ESOPHAGOGASTRODUODENOSCOPY (EGD);  Surgeon: Jerene Bears, MD;  Location: [facility];  Service: Endoscopy;  Laterality: N/A;    OB History    No data available       Home Medications    Prior to Admission medications   Medication Sig Start Date End Date Taking? Authorizing Provider  bacitracin ointment Apply 1 application topically 3  (three) times daily. 01/26/17   Waynetta Pean, PA-C  feeding supplement, ENSURE ENLIVE, (ENSURE ENLIVE) LIQD Take 237 mLs by mouth 3 (three) times daily between meals. 12/28/16   Mikhail, Velta Addison, DO  fluconazole (DIFLUCAN) 200 MG tablet Take 1 tablet (200 mg total) by mouth daily. 12/29/16   Mikhail, Velta Addison, DO  folic acid (FOLVITE) 1 MG tablet Take 1 tablet (1 mg total) by mouth daily. 12/29/16   Mikhail, Velta Addison, DO  furosemide (LASIX) 20 MG tablet Take 1 tablet (20 mg total) by mouth daily. 12/29/16   Cristal Ford, DO  Multiple Vitamin (MULTIVITAMIN WITH MINERALS) TABS tablet Take 1 tablet by mouth daily. 12/29/16   Mikhail, Velta Addison, DO  pantoprazole (PROTONIX) 40 MG tablet Take 1 tablet (40 mg total) by mouth 2 (two) times daily. 12/28/16   Mikhail, Velta Addison, DO  prednisoLONE (PRELONE) 15 MG/5ML SOLN 40mg  daily until 01/21/17 then taper down by 10mg  daily for 4 days. At 10mg  taper by 5mg  daily then 5 mg daily for 3 days 01/14/17   Willia Craze, NP  prednisoLONE (PRELONE) 15 MG/5ML SOLN Take 30mg  by mouth for 5 days then taper down by 5mg  every 5 days until off. 01/20/17   Willia Craze, NP  psyllium (HYDROCIL/METAMUCIL) 95 % PACK Take 1 packet by mouth daily. 12/29/16   Mikhail, Velta Addison, DO  spironolactone (ALDACTONE) 100 MG tablet Take 1 tablet (100 mg total) by mouth daily. 12/29/16   Cristal Ford,  DO  thiamine 100 MG tablet Take 1 tablet (100 mg total) by mouth daily. 12/29/16   Cristal Ford, DO  traZODone (DESYREL) 50 MG tablet Take 1/2 tablet HS to start Increase to 1-2 tablets HS as needed for sleep 01/26/17   Dara Hoyer, PA-C    Family History Family History  Problem Relation Age of Onset  . Hyperlipidemia Father   . Heart disease Father     Social History Social History  Substance Use Topics  . Smoking status: Former Smoker    Types: Cigarettes  . Smokeless tobacco: Never Used     Comment: Quit 34 days ago   . Alcohol use Yes     Comment: Last drink: 33 days ago       Allergies   Pollen extract   Review of Systems Review of Systems  Constitutional: Negative for fever.  Skin: Positive for wound. Negative for color change and rash.     Physical Exam Updated Vital Signs BP 102/78 (BP Location: Left Arm)   Pulse (!) 108   Temp 97.7 F (36.5 C) (Oral)   Resp 20   Ht 5\' 6"  (1.676 m)   Wt 51.7 kg (114 lb)   SpO2 100%   BMI 18.40 kg/m   Physical Exam  Constitutional: She appears well-developed and well-nourished. No distress.  HENT:  Head: Normocephalic and atraumatic.  Eyes: Right eye exhibits no discharge. Left eye exhibits no discharge.  Pulmonary/Chest: Effort normal. No respiratory distress.  Neurological: She is alert. Coordination normal.  Skin: Skin is warm and dry. No rash noted. She is not diaphoretic. No erythema. No pallor.  Well-healing laceration noted to her forehead. No discharge. No evidence of infection.  Psychiatric: She has a normal mood and affect. Her behavior is normal.  Nursing note and vitals reviewed.    ED Treatments / Results  Labs (all labs ordered are listed, but only abnormal results are displayed) Labs Reviewed - No data to display  EKG  EKG Interpretation None       Radiology No results found.  Procedures .Suture Removal Date/Time: 01/26/2017 9:57 PM Performed by: Waynetta Pean Authorized by: Waynetta Pean   Consent:    Consent obtained:  Verbal   Consent given by:  Patient   Risks discussed:  Pain, bleeding and wound separation Location:    Location:  Head/neck   Head/neck location:  Forehead Procedure details:    Wound appearance:  No signs of infection, good wound healing and clean   Number of sutures removed:  6 Post-procedure details:    Post-removal:  Antibiotic ointment applied   Patient tolerance of procedure:  Tolerated well, no immediate complications   (including critical care time)  Medications Ordered in ED Medications - No data to display   Initial  Impression / Assessment and Plan / ED Course  I have reviewed the triage vital signs and the nursing notes.  Pertinent labs & imaging results that were available during my care of the patient were reviewed by me and considered in my medical decision making (see chart for details).    Staple removal   Pt to ER for staple/suture removal and wound check as above. Procedure tolerated well. Vitals normal, no signs of infection. Scar minimization & return precautions given at dc.     Final Clinical Impressions(s) / ED Diagnoses   Final diagnoses:  Visit for suture removal    New Prescriptions New Prescriptions   BACITRACIN OINTMENT    Apply 1 application  topically 3 (three) times daily.     Waynetta Pean, PA-C 01/26/17 2214    Isla Pence, MD 01/26/17 [phone]

## 2017-01-26 NOTE — Progress Notes (Signed)
Pt called PCP re insomnia-rx sent from [facility] for Trazodone for ETOH withdrawal insomnia

## 2017-01-27 ENCOUNTER — Other Ambulatory Visit (HOSPITAL_COMMUNITY): Payer: 59

## 2017-01-27 NOTE — Telephone Encounter (Signed)
Beth, can you get the consult note from liver clinic sent to me. See saw them on 6/5. Thanks

## 2017-01-27 NOTE — Telephone Encounter (Signed)
I spoke with [place]. She will fax it to your area [facility] to your attention. Should be there very soon.

## 2017-01-28 ENCOUNTER — Other Ambulatory Visit: Payer: Self-pay | Admitting: Physician Assistant

## 2017-01-28 ENCOUNTER — Telehealth (HOSPITAL_COMMUNITY): Payer: Self-pay | Admitting: Psychology

## 2017-01-30 ENCOUNTER — Inpatient Hospital Stay (HOSPITAL_COMMUNITY)
Admission: EM | Admit: 2017-01-30 | Discharge: 2017-02-05 | DRG: 432 | Disposition: A | Discharge status: Home / Self Care | Payer: Managed Care, Other (non HMO) | Attending: Internal Medicine | Admitting: Internal Medicine

## 2017-01-30 ENCOUNTER — Encounter (HOSPITAL_COMMUNITY): Payer: Self-pay

## 2017-01-30 DIAGNOSIS — R1084 Generalized abdominal pain: Secondary | ICD-10-CM

## 2017-01-30 DIAGNOSIS — Z681 Body mass index (BMI) 19 or less, adult: Secondary | ICD-10-CM

## 2017-01-30 DIAGNOSIS — Z87891 Personal history of nicotine dependence: Secondary | ICD-10-CM

## 2017-01-30 DIAGNOSIS — K7290 Hepatic failure, unspecified without coma: Secondary | ICD-10-CM | POA: Diagnosis present

## 2017-01-30 DIAGNOSIS — K7031 Alcoholic cirrhosis of liver with ascites: Secondary | ICD-10-CM | POA: Diagnosis not present

## 2017-01-30 DIAGNOSIS — L89302 Pressure ulcer of unspecified buttock, stage 2: Secondary | ICD-10-CM | POA: Diagnosis present

## 2017-01-30 DIAGNOSIS — F411 Generalized anxiety disorder: Secondary | ICD-10-CM | POA: Diagnosis present

## 2017-01-30 DIAGNOSIS — R17 Unspecified jaundice: Secondary | ICD-10-CM

## 2017-01-30 DIAGNOSIS — D539 Nutritional anemia, unspecified: Secondary | ICD-10-CM | POA: Diagnosis not present

## 2017-01-30 DIAGNOSIS — K7010 Alcoholic hepatitis without ascites: Secondary | ICD-10-CM | POA: Diagnosis not present

## 2017-01-30 DIAGNOSIS — Z8349 Family history of other endocrine, nutritional and metabolic diseases: Secondary | ICD-10-CM

## 2017-01-30 DIAGNOSIS — F329 Major depressive disorder, single episode, unspecified: Secondary | ICD-10-CM | POA: Diagnosis present

## 2017-01-30 DIAGNOSIS — F1010 Alcohol abuse, uncomplicated: Secondary | ICD-10-CM | POA: Diagnosis present

## 2017-01-30 DIAGNOSIS — R109 Unspecified abdominal pain: Secondary | ICD-10-CM | POA: Diagnosis not present

## 2017-01-30 DIAGNOSIS — E43 Unspecified severe protein-calorie malnutrition: Secondary | ICD-10-CM | POA: Diagnosis present

## 2017-01-30 DIAGNOSIS — K7011 Alcoholic hepatitis with ascites: Secondary | ICD-10-CM | POA: Diagnosis not present

## 2017-01-30 DIAGNOSIS — K766 Portal hypertension: Secondary | ICD-10-CM | POA: Diagnosis present

## 2017-01-30 DIAGNOSIS — K7682 Hepatic encephalopathy: Secondary | ICD-10-CM

## 2017-01-30 DIAGNOSIS — E871 Hypo-osmolality and hyponatremia: Secondary | ICD-10-CM | POA: Diagnosis present

## 2017-01-30 DIAGNOSIS — K7210 Chronic hepatic failure without coma: Secondary | ICD-10-CM

## 2017-01-30 DIAGNOSIS — T380X5A Adverse effect of glucocorticoids and synthetic analogues, initial encounter: Secondary | ICD-10-CM | POA: Diagnosis not present

## 2017-01-30 DIAGNOSIS — Z8249 Family history of ischemic heart disease and other diseases of the circulatory system: Secondary | ICD-10-CM

## 2017-01-30 DIAGNOSIS — D72829 Elevated white blood cell count, unspecified: Secondary | ICD-10-CM

## 2017-01-30 DIAGNOSIS — R1310 Dysphagia, unspecified: Secondary | ICD-10-CM | POA: Diagnosis present

## 2017-01-30 DIAGNOSIS — A419 Sepsis, unspecified organism: Secondary | ICD-10-CM | POA: Diagnosis not present

## 2017-01-30 DIAGNOSIS — R188 Other ascites: Secondary | ICD-10-CM

## 2017-01-30 DIAGNOSIS — R64 Cachexia: Secondary | ICD-10-CM | POA: Diagnosis present

## 2017-01-30 LAB — CBC
HCT: 32.8 % — ABNORMAL LOW (ref 36.0–46.0)
Hemoglobin: 11.8 g/dL — ABNORMAL LOW (ref 12.0–15.0)
MCH: 36.9 pg — ABNORMAL HIGH (ref 26.0–34.0)
MCHC: 36 g/dL (ref 30.0–36.0)
MCV: 102.5 fL — ABNORMAL HIGH (ref 78.0–100.0)
PLATELETS: 226 10*3/uL (ref 150–400)
RBC: 3.2 MIL/uL — ABNORMAL LOW (ref 3.87–5.11)
RDW: 15.1 % (ref 11.5–15.5)
WBC: 41.6 10*3/uL — AB (ref 4.0–10.5)

## 2017-01-30 LAB — BODY FLUID CELL COUNT WITH DIFFERENTIAL
LYMPHS FL: 10 %
Neutrophil Count, Fluid: 90 % — ABNORMAL HIGH (ref 0–25)
WBC FLUID: 75 uL (ref 0–1000)

## 2017-01-30 LAB — COMPREHENSIVE METABOLIC PANEL
ALBUMIN: 2.1 g/dL — AB (ref 3.5–5.0)
ALT: 108 U/L — AB (ref 14–54)
AST: 115 U/L — AB (ref 15–41)
Alkaline Phosphatase: 646 U/L — ABNORMAL HIGH (ref 38–126)
Anion gap: 9 (ref 5–15)
BILIRUBIN TOTAL: 14 mg/dL — AB (ref 0.3–1.2)
BUN: 22 mg/dL — AB (ref 6–20)
CO2: 21 mmol/L — ABNORMAL LOW (ref 22–32)
CREATININE: 0.52 mg/dL (ref 0.44–1.00)
Calcium: 8.1 mg/dL — ABNORMAL LOW (ref 8.9–10.3)
Chloride: 99 mmol/L — ABNORMAL LOW (ref 101–111)
GFR calc Af Amer: >60 mL/min (ref >60)
GLUCOSE: 129 mg/dL — AB (ref 65–99)
Potassium: 5.2 mmol/L — ABNORMAL HIGH (ref 3.5–5.1)
Sodium: 129 mmol/L — ABNORMAL LOW (ref 135–145)
TOTAL PROTEIN: 5.3 g/dL — AB (ref 6.5–8.1)

## 2017-01-30 LAB — I-STAT CG4 LACTIC ACID, ED: Lactic Acid, Venous: 2.32 mmol/L (ref 0.5–1.9)

## 2017-01-30 LAB — LIPASE, BLOOD: Lipase: 37 U/L (ref 11–51)

## 2017-01-30 LAB — AMMONIA: AMMONIA: 102 umol/L — AB (ref 9–35)

## 2017-01-30 MED ORDER — DEXTROSE 5 % IV SOLN
1.0000 g | Freq: Once | INTRAVENOUS | Status: AC
  Administered 2017-01-30: 1 g via INTRAVENOUS
  Filled 2017-01-30: qty 10

## 2017-01-30 MED ORDER — SODIUM CHLORIDE 0.9 % IV BOLUS (SEPSIS)
500.0000 mL | Freq: Once | INTRAVENOUS | Status: AC
  Administered 2017-01-30: 500 mL via INTRAVENOUS

## 2017-01-30 MED ORDER — LIDOCAINE-EPINEPHRINE (PF) 2 %-1:200000 IJ SOLN
20.0000 mL | Freq: Once | INTRAMUSCULAR | Status: AC
  Administered 2017-01-30: 20 mL
  Filled 2017-01-30: qty 20

## 2017-01-30 NOTE — H&P (Signed)
History and Physical    Mercedes Mitchell XFG:[id number] DOB: [birth year] DOA: 01/30/2017  PCP: [place], Betty G, MD Consultants:  Chester Holstein - GI Patient coming from: Home - lives with husband; NOK: husband, [phone]  Chief Complaint: abdominal pain  HPI: Mercedes Mitchell is a 56 y.o. female with medical history significant of alcoholic cirrhosis with ascites, anemia, and malnutrition presenting with abdominal pain.  She reports that she has a "thing" on her butt - like an abscess, extraordinarily painful (stage 2 pressure ulcer); not sleeping, "I've got all this fluid in my body."  Hard to get up and down due to ascites.  Swollen lymph nodes in neck "recently." Fluid in abdomen is worsening.  She went to an [place] last week and tripped and hit her head about 1 week ago, requiring sutures (recently removed).  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    ED Course: Cirrhosis, ascites, jaundice with subjective fever, chills, abdominal pain concerning for SBP.  Lactate 2.32 with tachycardia and hypotension.  WBC 41.6, worsening transaminitis.  Elevated ammonia to 102.  Diagnostic paracentesis, started on Rocephin.  Review of Systems: As per HPI; otherwise 10 point review of systems reviewed and negative.   Ambulatory Status:  Ambulates with a walker  Past Medical History:  Diagnosis Date  . 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  paracentesis 2.6 liters 5/4: no SBP.    Marland Kitchen Alcoholism ([place]) 12/2016  . Dysphagia 2011  . Macrocytic anemia 12/2016  . Malnutrition ([place]) 12/2016  . Panic type anxiety neurosis 2011    Past Surgical History:  Procedure Laterality Date  . ESOPHAGOGASTRODUODENOSCOPY N/A 12/25/2016   Procedure: ESOPHAGOGASTRODUODENOSCOPY (EGD);  Surgeon: Jerene Bears, MD;  Location: [facility];  Service: Endoscopy;  Laterality: N/A;    Social History   Social History  . Marital status: Married    Spouse name: N/A  . Number of  children: N/A  . Years of education: N/A   Occupational History  . Unemployed    Social History Main Topics  . Smoking status: Former Smoker    Types: Cigarettes  . Smokeless tobacco: Never Used     Comment: Reports she quit "about 6 months ago"  . Alcohol use Yes     Comment: Last drink: 36 days ago   . Drug use: No     Comment: Last use Cocaine 11/17, reports last use "over a year", [facility] 2/18, reports "a year" ago  . Sexual activity: Not on file   Other Topics Concern  . Not on file   Social History Narrative  . No narrative on file    No Known Allergies  Family History  Problem Relation Age of Onset  . Hyperlipidemia Father   . Heart disease Father     Prior to Admission medications   Medication Sig Start Date End Date Taking? Authorizing Provider  bacitracin ointment Apply 1 application topically 3 (three) times daily. 01/26/17  Yes Waynetta Pean, PA-C  feeding supplement, ENSURE ENLIVE, (ENSURE ENLIVE) LIQD Take 237 mLs by mouth 3 (three) times daily between meals. 12/28/16  Yes Mikhail, Velta Addison, DO  folic acid (FOLVITE) 1 MG tablet Take 1 tablet (1 mg total) by mouth daily. 12/29/16  Yes Mikhail, Maryann, DO  furosemide (LASIX) 20 MG tablet Take 1 tablet (20 mg total) by mouth daily. 12/29/16  Yes Mikhail, Maryann, DO  lactulose (CHRONULAC) 10 GM/15ML solution Take 15 mLs by mouth daily. 01/25/17  Yes  [provider]  Multiple Vitamin (MULTIVITAMIN WITH MINERALS) TABS tablet Take 1 tablet by mouth daily. 12/29/16  Yes Mikhail, Maryann, DO  pantoprazole (PROTONIX) 40 MG tablet Take 1 tablet (40 mg total) by mouth 2 (two) times daily. 12/28/16  Yes Mikhail, Velta Addison, DO  prednisoLONE (PRELONE) 15 MG/5ML SOLN 40mg  daily until 01/21/17 then taper down by 10mg  daily for 4 days. At 10mg  taper by 5mg  daily then 5 mg daily for 3 days 01/14/17  Yes Willia Craze, NP  psyllium (HYDROCIL/METAMUCIL) 95 % PACK Take 1 packet by mouth daily. 12/29/16  Yes Mikhail, Velta Addison, DO    spironolactone (ALDACTONE) 100 MG tablet Take 1 tablet (100 mg total) by mouth daily. 12/29/16  Yes Mikhail, Velta Addison, DO  traZODone (DESYREL) 50 MG tablet Take 1/2 tablet HS to start Increase to 1-2 tablets HS as needed for sleep 01/26/17  Yes Dara Hoyer, PA-C  fluconazole (DIFLUCAN) 200 MG tablet Take 1 tablet (200 mg total) by mouth daily. 12/29/16   Mikhail, Velta Addison, DO  prednisoLONE (PRELONE) 15 MG/5ML SOLN Take 30mg  by mouth for 5 days then taper down by 5mg  every 5 days until off. 01/20/17   Willia Craze, NP  thiamine 100 MG tablet Take 1 tablet (100 mg total) by mouth daily. Patient not taking: Reported on 01/30/2017 12/29/16   Cristal Ford, DO    Physical Exam: Vitals:   01/30/17 1925 01/30/17 2119 01/30/17 2245 01/31/17 0036  BP:   103/68 105/76  Pulse:   (!) 103 (!) 102  Resp:   18 16  Temp:  98.6 F (37 C)    TempSrc:  Rectal    SpO2:   95% 94%  Weight: 51.7 kg (114 lb)     Height: 5\' 6"  (1.676 m)        General:  Appears calm and comfortable and is NAD.  She is quite jaundiced. Eyes:  PERRL, EOMI, normal lids, iris, +scleral icterus ENT:  grossly normal hearing, lips & tongue, mmm Neck:  shotty LAD, no masses or thyromegaly Cardiovascular:  RRR, no m/r/g. 3+ pitting LE edema.  Respiratory:  CTA bilaterally, no w/r/r. Normal respiratory effort. Abdomen:  Marked ascites, extremely taut, +significant fluid wave. Skin:  no rash or induration seen on limited exam Musculoskeletal:  grossly normal tone BUE/BLE, good ROM, no bony abnormality Psychiatric:  Flat mood and affect with emotional lability, speech fluent and appropriate, AOx3 Neurologic:  CN 2-12 grossly intact, moves all extremities in coordinated fashion, sensation intact  Labs on Admission: I have personally reviewed following labs and imaging studies  CBC:  Recent Labs Lab 01/30/17 1957  WBC 41.6*  HGB 11.8*  HCT 32.8*  MCV 102.5*  PLT 240   Basic Metabolic Panel:  Recent Labs Lab  01/30/17 1957  NA 129*  K 5.2*  CL 99*  CO2 21*  GLUCOSE 129*  BUN 22*  CREATININE 0.52  CALCIUM 8.1*   GFR: Estimated Creatinine Clearance: 65.6 mL/min (by C-G formula based on SCr of 0.52 mg/dL). Liver Function Tests:  Recent Labs Lab 01/30/17 1957  AST 115*  ALT 108*  ALKPHOS 646*  BILITOT 14.0*  PROT 5.3*  ALBUMIN 2.1*    Recent Labs Lab 01/30/17 1957  LIPASE 37    Recent Labs Lab 01/30/17 2140  AMMONIA 102*   Coagulation Profile: No results for input(s): INR, PROTIME in the last 168 hours. Cardiac Enzymes: No results for input(s): CKTOTAL, CKMB, CKMBINDEX, TROPONINI in the last 168 hours. BNP (last 3 results)  No results for input(s): PROBNP in the last 8760 hours. HbA1C: No results for input(s): HGBA1C in the last 72 hours. CBG: No results for input(s): GLUCAP in the last 168 hours. Lipid Profile: No results for input(s): CHOL, HDL, LDLCALC, TRIG, CHOLHDL, LDLDIRECT in the last 72 hours. Thyroid Function Tests: No results for input(s): TSH, T4TOTAL, FREET4, T3FREE, THYROIDAB in the last 72 hours. Anemia Panel: No results for input(s): VITAMINB12, FOLATE, FERRITIN, TIBC, IRON, RETICCTPCT in the last 72 hours. Urine analysis:    Component Value Date/Time   COLORURINE AMBER (A) 12/23/2016 1413   APPEARANCEUR HAZY (A) 12/23/2016 1413   LABSPEC 1.009 12/23/2016 1413   PHURINE 6.0 12/23/2016 1413   GLUCOSEU NEGATIVE 12/23/2016 1413   HGBUR SMALL (A) 12/23/2016 1413   HGBUR large 04/13/2010 1228   BILIRUBINUR MODERATE (A) 12/23/2016 1413   KETONESUR NEGATIVE 12/23/2016 1413   PROTEINUR NEGATIVE 12/23/2016 1413   UROBILINOGEN 0.2 04/13/2010 1228   NITRITE NEGATIVE 12/23/2016 1413   LEUKOCYTESUR NEGATIVE 12/23/2016 1413    Creatinine Clearance: Estimated Creatinine Clearance: 65.6 mL/min (by C-G formula based on SCr of 0.52 mg/dL).  Sepsis Labs: @LABRCNTIP (procalcitonin:4,lacticidven:4) )No results found for this or any previous visit (from the  past 240 hour(s)).   Radiological Exams on Admission: No results found.  EKG: Independently reviewed.  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 -Will check ETOH level and UDS, as it is somewhat concerning that her last reported use is inconsistent compared to her prior hospitalization. -She clearly has large-volume ascites at this time and will need paracentesis; IR consult requested. -She did have a diagnostic paracentesis in the ER; the fluid is not overly concerning for SBP other than a strong neutrophil-predominance; will continue abx for SBP but broaden coverage for sepsis (see below) -Continue PPI BID -Will continue lasix 20mg  daily and spironolactone 100mg  daily -There was mention by the ER and patient of a pending liver biopsy but I do not see evidence of this plan in the chart; will include this as a possibility for IR tomorrow  Alcohol abuse -Drinks approximately 2/3 of 1/5th bottle of vodka daily; reports 36 days of sobriety -Alcohol level pending -Will need CIWA protocol if ETOH level is positive -Continue multivitamin, folic acid, and  thiamine   Macrocytic anemia -Hgb 11.8; prior 13.4 -Continue to monitor CBC  Severe malnutrition -Nutrition consulted on prior admission -Continue feeding supplements  Hyponatremia -Continue IV fluids, monitor BMP  Sepsis -Lactate 2.32 -WBC 41.6; prior 37.5 -Peritoneal fluid: yellow, 75 WBC, 90% neutrophils -Blood cultures pending -While awaiting blood cultures, this appears to be a preseptic condition. -Sepsis protocol initiated -Treat with IV Cefepime and Vanc to cover SBP but also  undifferentiated sepsis -Will trend lactate to ensure improvement and add procalcitonin   DVT prophylaxis:  SCDs Code Status: Full - confirmed with patient Family Communication: None present Disposition Plan:  Home once clinically improved Consults called: IR; likely to need GI  Admission status: Admit - It is my clinical opinion that admission to INPATIENT is reasonable and necessary because this patient will require at least 2 midnights in the hospital to treat this condition based on the medical complexity of the problems presented.  Given the aforementioned information, the predictability of an adverse outcome is felt to be significant.    Karmen Bongo MD [facility]  If 7PM-7AM, please contact night-coverage [email or web address] Password [email or web address]  01/31/2017, 1:35 AM

## 2017-01-30 NOTE — ED Notes (Signed)
ED Provider at bedside. 

## 2017-01-30 NOTE — ED Notes (Signed)
Pt made aware of the need for urine sample.  

## 2017-01-30 NOTE — ED Triage Notes (Signed)
States abdominal pain with back pain and chest pain voiced states supposed to go to have liver biopsy tomorrow no fever hx of liver disease jaundiced in triage.

## 2017-01-30 NOTE — ED Provider Notes (Signed)
[place] DEPT Provider Note   CSN: [id number] Arrival date & time: 01/30/17  1851     History   Chief Complaint Chief Complaint  Patient presents with  . Abdominal Pain    HPI Mercedes Mitchell is a 56 y.o. female.  HPI   55 year old female with significant history of alcoholic cirrhosis with ascites present complaining of abdominal pain. Patient is a poor historian, family at bedside provide history. Patient has had known jaundice for the past several months. She is scheduled to have a liver biopsy tomorrow. She has had 2 paracentesis for ascites, states last paracentesis was about 2 weeks ago. She is here due to progressive worsening abdominal pain, back pain, chest pain, nausea and weakness. Patient states her body is sore, abdominal swelling has increased, she felt bloated, she endorse nausea without vomiting. Her jaundice is progressively worse. She has decubitus ulcer in the sacrum that is hurting her. She has stopped drinking alcohol for the past month. For the past several days she also feels feverish. She is requesting to be admitted for the symptoms.  Past Medical History:  Diagnosis Date  . 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  paracentesis 2.6 liters 5/4: no SBP.    Marland Kitchen Alcoholism ([place]) 12/2016  . Dysphagia 2011  . Macrocytic anemia 12/2016  . Malnutrition ([place]) 12/2016  . Panic type anxiety neurosis 2011    Patient Active Problem List   Diagnosis Date Noted  . Alcoholic cirrhosis of liver with ascites ([place]) 01/12/2017  . GI bleeding 01/12/2017  . Current every day smoker 01/12/2017  . Ascites   . Coffee ground emesis   . Acute esophagitis   . Protein-calorie malnutrition, severe 12/24/2016  . Acute alcoholic hepatitis 19/37/9024  . ETOH abuse 12/23/2016  . Macrocytic anemia 12/21/2016  . 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 DYSPHAGIA 03/25/2010    Past Surgical History:    Procedure Laterality Date  . ESOPHAGOGASTRODUODENOSCOPY N/A 12/25/2016   Procedure: ESOPHAGOGASTRODUODENOSCOPY (EGD);  Surgeon: Jerene Bears, MD;  Location: [facility];  Service: Endoscopy;  Laterality: N/A;    OB History    No data available       Home Medications    Prior to Admission medications   Medication Sig Start Date End Date Taking? Authorizing Provider  bacitracin ointment Apply 1 application topically 3 (three) times daily. 01/26/17   Waynetta Pean, PA-C  feeding supplement, ENSURE ENLIVE, (ENSURE ENLIVE) LIQD Take 237 mLs by mouth 3 (three) times daily between meals. 12/28/16   Mikhail, Velta Addison, DO  fluconazole (DIFLUCAN) 200 MG tablet Take 1 tablet (200 mg total) by mouth daily. 12/29/16   Mikhail, Velta Addison, DO  folic acid (FOLVITE) 1 MG tablet Take 1 tablet (1 mg total) by mouth daily. 12/29/16   Mikhail, Velta Addison, DO  furosemide (LASIX) 20 MG tablet Take 1 tablet (20 mg total) by mouth daily. 12/29/16   Cristal Ford, DO  Multiple Vitamin (MULTIVITAMIN WITH MINERALS) TABS tablet Take 1 tablet by mouth daily. 12/29/16   Mikhail, Velta Addison, DO  pantoprazole (PROTONIX) 40 MG tablet Take 1 tablet (40 mg total) by mouth 2 (two) times daily. 12/28/16   Cristal Ford, DO  prednisoLONE (PRELONE) 15 MG/5ML SOLN 40mg  daily until 01/21/17 then taper down by 10mg  daily for 4 days. At 10mg  taper by 5mg  daily then 5 mg daily for 3 days 01/14/17   Willia Craze, NP  prednisoLONE (PRELONE) 15 MG/5ML SOLN Take  30mg  by mouth for 5 days then taper down by 5mg  every 5 days until off. 01/20/17   Willia Craze, NP  psyllium (HYDROCIL/METAMUCIL) 95 % PACK Take 1 packet by mouth daily. 12/29/16   Mikhail, Velta Addison, DO  spironolactone (ALDACTONE) 100 MG tablet Take 1 tablet (100 mg total) by mouth daily. 12/29/16   Cristal Ford, DO  thiamine 100 MG tablet Take 1 tablet (100 mg total) by mouth daily. 12/29/16   Cristal Ford, DO  traZODone (DESYREL) 50 MG tablet Take 1/2 tablet HS to start Increase to  1-2 tablets HS as needed for sleep 01/26/17   Dara Hoyer, PA-C    Family History Family History  Problem Relation Age of Onset  . Hyperlipidemia Father   . Heart disease Father     Social History Social History  Substance Use Topics  . Smoking status: Former Smoker    Types: Cigarettes  . Smokeless tobacco: Never Used     Comment: Quit 34 days ago   . Alcohol use Yes     Comment: Last drink: 33 days ago      Allergies   Patient has no known allergies.   Review of Systems Review of Systems  All other systems reviewed and are negative.    Physical Exam Updated Vital Signs BP 95/75 (BP Location: Right Arm)   Pulse (!) 124   Temp 98.3 F (36.8 C) (Oral)   Resp 18   Ht 5\' 6"  (1.676 m)   Wt 51.7 kg (114 lb)   SpO2 97%   BMI 18.40 kg/m   Physical Exam  Constitutional: She is oriented to person, place, and time.  Very malnourished female, sick appearing, with significant skin jaundice.  HENT:  Scab noted to mid forehead  Eyes: Scleral icterus is present.  Neck: Normal range of motion. Neck supple.  Cardiovascular: Normal rate and regular rhythm.   Murmur heard. Pulmonary/Chest: Effort normal and breath sounds normal. She has no wheezes. She has no rales.  Abdominal: She exhibits distension. There is tenderness (Abdomen is distended, with diffuse mild tenderness on palpation).  Genitourinary:  Genitourinary Comments: Chaperone present during exam. Stage II decubitus ulcer in the sacral region.  Musculoskeletal: She exhibits edema (Bilateral lower extremities with 2+ pitting edema, intact distal pulses).  Neurological: She is alert and oriented to person, place, and time.  Nursing note and vitals reviewed.    ED Treatments / Results  Labs (all labs ordered are listed, but only abnormal results are displayed) Labs Reviewed  COMPREHENSIVE METABOLIC PANEL - Abnormal; Notable for the following:       Result Value   Sodium 129 (*)    Potassium 5.2 (*)     Chloride 99 (*)    CO2 21 (*)    Glucose, Bld 129 (*)    BUN 22 (*)    Calcium 8.1 (*)    Total Protein 5.3 (*)    Albumin 2.1 (*)    AST 115 (*)    ALT 108 (*)    Alkaline Phosphatase 646 (*)    Total Bilirubin 14.0 (*)    All other components within normal limits  CBC - Abnormal; Notable for the following:    WBC 41.6 (*)    RBC 3.20 (*)    Hemoglobin 11.8 (*)    HCT 32.8 (*)    MCV 102.5 (*)    MCH 36.9 (*)    All other components within normal limits  AMMONIA - Abnormal; Notable for  the following:    Ammonia 102 (*)    All other components within normal limits  I-STAT CG4 LACTIC ACID, ED - Abnormal; Notable for the following:    Lactic Acid, Venous 2.32 (*)    All other components within normal limits  CULTURE, BLOOD (ROUTINE X 2)  CULTURE, BLOOD (ROUTINE X 2)  LIPASE, BLOOD  URINALYSIS, ROUTINE W REFLEX MICROSCOPIC  BODY FLUID CELL COUNT WITH DIFFERENTIAL    EKG  EKG Interpretation None      Date: 01/30/2017  Rate: 124  Rhythm: sinus tachycardia  QRS Axis: normal  Intervals: normal  ST/T Wave abnormalities: normal  Conduction Disutrbances: borderline intraventricular conduction delay  Narrative Interpretation:   Old EKG Reviewed: No significant changes noted     Radiology No results found.  Procedures ABDOMINAL PARACENTESIS Date/Time: 01/30/2017 10:30 PM Performed by: Domenic Moras Authorized by: Fatima Blank  Consent: Verbal consent obtained. Risks and benefits: risks, benefits and alternatives were discussed Consent given by: patient Patient understanding: patient states understanding of the procedure being performed Patient consent: the patient's understanding of the procedure matches consent given Patient identity confirmed: verbally with patient and arm band Time out: Immediately prior to procedure a "time out" was called to verify the correct patient, procedure, equipment, support staff and site/side marked as required. Preparation:  Patient was prepped and draped in the usual sterile fashion. Local anesthesia used: yes  Anesthesia: Local anesthesia used: yes Local Anesthetic: lidocaine 2% with epinephrine Anesthetic total: 4 mL  Sedation: Patient sedated: no Patient tolerance: Patient tolerated the procedure well with no immediate complications    (including critical care time)  Medications Ordered in ED Medications  cefTRIAXone (ROCEPHIN) 1 g in dextrose 5 % 50 mL IVPB (not administered)  sodium chloride 0.9 % bolus 500 mL (not administered)  lidocaine-EPINEPHrine (XYLOCAINE W/EPI) 2 %-1:200000 (PF) injection 20 mL (20 mLs Infiltration Given 01/30/17 2248)     Initial Impression / Assessment and Plan / ED Course  I have reviewed the triage vital signs and the nursing notes.  Pertinent labs & imaging results that were available during my care of the patient were reviewed by me and considered in my medical decision making (see chart for details).     BP 103/68 (BP Location: Left Arm)   Pulse (!) 103   Temp 98.6 F (37 C) (Rectal)   Resp 18   Ht 5\' 6"  (1.676 m)   Wt 51.7 kg (114 lb)   SpO2 95%   BMI 18.40 kg/m    Final Clinical Impressions(s) / ED Diagnoses   Final diagnoses:  Generalized abdominal pain  Hepatic encephalopathy (HCC)  Jaundice    New Prescriptions New Prescriptions   No medications on file   10:25 PM Pt with significant hx of liver cirrhosis with ascite now with jaundice for the past several months.  She is here with subjective fever, chills, and abd pain concerning for SBP.  Initially vital sign includes tachycardia and hypotension with elevated lactic acid of 2.32, and WBC 41.6.  Evidence of transaminitis, elevated from prior.   Elevated ammonia of 102, pt with mild hepatic encephalopathy.  We performed diagnostic abdominal paracentesis, pt tolerates well.  Will sent for further testing.  Rocephin abx initiated.  Will consult for admission.  Blood culture sent.  Care shared  with DR. Ebrahim Deremer, who assisted with paracentesis.    11:10 PM Appreciate consultation from [facility] Dr. Lorin Mercy who agrees to see pt in the ER and will admit for  further care.    CRITICAL CARE Performed by: Domenic Moras Total critical care time: 35 minutes Critical care time was exclusive of separately billable procedures and treating other patients. Critical care was necessary to treat or prevent imminent or life-threatening deterioration. Critical care was time spent personally by me on the following activities: development of treatment plan with patient and/or surrogate as well as nursing, discussions with consultants, evaluation of patient's response to treatment, examination of patient, obtaining history from patient or surrogate, ordering and performing treatments and interventions, ordering and review of laboratory studies, ordering and review of radiographic studies, pulse oximetry and re-evaluation of patient's condition.    Domenic Moras, PA-C 01/30/17 2311    Fatima Blank, MD 01/31/17 [phone]

## 2017-01-30 NOTE — ED Notes (Signed)
Hospitalist at bedside 

## 2017-01-31 ENCOUNTER — Telehealth: Payer: Self-pay | Admitting: Internal Medicine

## 2017-01-31 ENCOUNTER — Other Ambulatory Visit (HOSPITAL_COMMUNITY): Payer: 59

## 2017-01-31 ENCOUNTER — Encounter (HOSPITAL_COMMUNITY): Payer: Self-pay | Admitting: Internal Medicine

## 2017-01-31 ENCOUNTER — Encounter (HOSPITAL_COMMUNITY): Payer: Self-pay | Admitting: Psychology

## 2017-01-31 ENCOUNTER — Inpatient Hospital Stay (HOSPITAL_COMMUNITY): Payer: Managed Care, Other (non HMO)

## 2017-01-31 ENCOUNTER — Ambulatory Visit (HOSPITAL_COMMUNITY): Payer: Managed Care, Other (non HMO)

## 2017-01-31 ENCOUNTER — Inpatient Hospital Stay (HOSPITAL_COMMUNITY)
Admission: RE | Admit: 2017-01-31 | Discharge: 2017-01-31 | Disposition: A | Discharge status: Home / Self Care | Payer: Self-pay | Source: Ambulatory Visit | Attending: Nurse Practitioner | Admitting: Nurse Practitioner

## 2017-01-31 DIAGNOSIS — K7031 Alcoholic cirrhosis of liver with ascites: Secondary | ICD-10-CM | POA: Diagnosis present

## 2017-01-31 DIAGNOSIS — Q453 Other congenital malformations of pancreas and pancreatic duct: Secondary | ICD-10-CM | POA: Diagnosis not present

## 2017-01-31 DIAGNOSIS — Z681 Body mass index (BMI) 19 or less, adult: Secondary | ICD-10-CM | POA: Diagnosis not present

## 2017-01-31 DIAGNOSIS — F1210 Cannabis abuse, uncomplicated: Secondary | ICD-10-CM | POA: Insufficient documentation

## 2017-01-31 DIAGNOSIS — Z811 Family history of alcohol abuse and dependence: Secondary | ICD-10-CM | POA: Diagnosis not present

## 2017-01-31 DIAGNOSIS — R64 Cachexia: Secondary | ICD-10-CM | POA: Diagnosis present

## 2017-01-31 DIAGNOSIS — R5381 Other malaise: Secondary | ICD-10-CM | POA: Diagnosis not present

## 2017-01-31 DIAGNOSIS — F1010 Alcohol abuse, uncomplicated: Secondary | ICD-10-CM | POA: Diagnosis present

## 2017-01-31 DIAGNOSIS — L89302 Pressure ulcer of unspecified buttock, stage 2: Secondary | ICD-10-CM | POA: Diagnosis present

## 2017-01-31 DIAGNOSIS — E871 Hypo-osmolality and hyponatremia: Secondary | ICD-10-CM | POA: Diagnosis present

## 2017-01-31 DIAGNOSIS — Z8249 Family history of ischemic heart disease and other diseases of the circulatory system: Secondary | ICD-10-CM | POA: Diagnosis not present

## 2017-01-31 DIAGNOSIS — F17210 Nicotine dependence, cigarettes, uncomplicated: Secondary | ICD-10-CM | POA: Diagnosis not present

## 2017-01-31 DIAGNOSIS — G4700 Insomnia, unspecified: Secondary | ICD-10-CM | POA: Insufficient documentation

## 2017-01-31 DIAGNOSIS — D539 Nutritional anemia, unspecified: Secondary | ICD-10-CM | POA: Diagnosis present

## 2017-01-31 DIAGNOSIS — L98419 Non-pressure chronic ulcer of buttock with unspecified severity: Secondary | ICD-10-CM

## 2017-01-31 DIAGNOSIS — R109 Unspecified abdominal pain: Secondary | ICD-10-CM | POA: Diagnosis present

## 2017-01-31 DIAGNOSIS — R946 Abnormal results of thyroid function studies: Secondary | ICD-10-CM | POA: Diagnosis not present

## 2017-01-31 DIAGNOSIS — E43 Unspecified severe protein-calorie malnutrition: Secondary | ICD-10-CM | POA: Diagnosis not present

## 2017-01-31 DIAGNOSIS — D72829 Elevated white blood cell count, unspecified: Secondary | ICD-10-CM | POA: Diagnosis not present

## 2017-01-31 DIAGNOSIS — R1310 Dysphagia, unspecified: Secondary | ICD-10-CM | POA: Diagnosis present

## 2017-01-31 DIAGNOSIS — R17 Unspecified jaundice: Secondary | ICD-10-CM | POA: Diagnosis not present

## 2017-01-31 DIAGNOSIS — Z8349 Family history of other endocrine, nutritional and metabolic diseases: Secondary | ICD-10-CM | POA: Diagnosis not present

## 2017-01-31 DIAGNOSIS — Z87891 Personal history of nicotine dependence: Secondary | ICD-10-CM | POA: Diagnosis not present

## 2017-01-31 DIAGNOSIS — K766 Portal hypertension: Secondary | ICD-10-CM | POA: Diagnosis present

## 2017-01-31 DIAGNOSIS — F4322 Adjustment disorder with anxiety: Secondary | ICD-10-CM | POA: Diagnosis not present

## 2017-01-31 DIAGNOSIS — R1084 Generalized abdominal pain: Secondary | ICD-10-CM | POA: Diagnosis not present

## 2017-01-31 DIAGNOSIS — K7011 Alcoholic hepatitis with ascites: Secondary | ICD-10-CM | POA: Diagnosis present

## 2017-01-31 DIAGNOSIS — F411 Generalized anxiety disorder: Secondary | ICD-10-CM | POA: Diagnosis present

## 2017-01-31 DIAGNOSIS — K7290 Hepatic failure, unspecified without coma: Secondary | ICD-10-CM | POA: Diagnosis present

## 2017-01-31 DIAGNOSIS — R000 Tachycardia, unspecified: Secondary | ICD-10-CM | POA: Diagnosis not present

## 2017-01-31 DIAGNOSIS — K7200 Acute and subacute hepatic failure without coma: Secondary | ICD-10-CM | POA: Diagnosis not present

## 2017-01-31 DIAGNOSIS — T380X5A Adverse effect of glucocorticoids and synthetic analogues, initial encounter: Secondary | ICD-10-CM | POA: Diagnosis not present

## 2017-01-31 DIAGNOSIS — I82411 Acute embolism and thrombosis of right femoral vein: Secondary | ICD-10-CM | POA: Diagnosis not present

## 2017-01-31 DIAGNOSIS — K7010 Alcoholic hepatitis without ascites: Secondary | ICD-10-CM | POA: Diagnosis not present

## 2017-01-31 DIAGNOSIS — F329 Major depressive disorder, single episode, unspecified: Secondary | ICD-10-CM | POA: Diagnosis present

## 2017-01-31 DIAGNOSIS — F1020 Alcohol dependence, uncomplicated: Secondary | ICD-10-CM | POA: Insufficient documentation

## 2017-01-31 LAB — ALBUMIN, PLEURAL OR PERITONEAL FLUID

## 2017-01-31 LAB — COMPREHENSIVE METABOLIC PANEL
ALT: 82 U/L — AB (ref 14–54)
ANION GAP: 8 (ref 5–15)
AST: 80 U/L — ABNORMAL HIGH (ref 15–41)
Albumin: 2.4 g/dL — ABNORMAL LOW (ref 3.5–5.0)
Alkaline Phosphatase: 498 U/L — ABNORMAL HIGH (ref 38–126)
BUN: 23 mg/dL — ABNORMAL HIGH (ref 6–20)
CHLORIDE: 100 mmol/L — AB (ref 101–111)
CO2: 23 mmol/L (ref 22–32)
CREATININE: 0.5 mg/dL (ref 0.44–1.00)
Calcium: 8.1 mg/dL — ABNORMAL LOW (ref 8.9–10.3)
Glucose, Bld: 99 mg/dL (ref 65–99)
POTASSIUM: 4.5 mmol/L (ref 3.5–5.1)
SODIUM: 131 mmol/L — AB (ref 135–145)
Total Bilirubin: 11.3 mg/dL — ABNORMAL HIGH (ref 0.3–1.2)
Total Protein: 5 g/dL — ABNORMAL LOW (ref 6.5–8.1)

## 2017-01-31 LAB — CBC
HCT: 27.3 % — ABNORMAL LOW (ref 36.0–46.0)
Hemoglobin: 9.7 g/dL — ABNORMAL LOW (ref 12.0–15.0)
MCH: 36.5 pg — AB (ref 26.0–34.0)
MCHC: 35.5 g/dL (ref 30.0–36.0)
MCV: 102.6 fL — AB (ref 78.0–100.0)
PLATELETS: 187 10*3/uL (ref 150–400)
RBC: 2.66 MIL/uL — AB (ref 3.87–5.11)
RDW: 14.9 % (ref 11.5–15.5)
WBC: 35.1 10*3/uL — ABNORMAL HIGH (ref 4.0–10.5)

## 2017-01-31 LAB — RAPID URINE DRUG SCREEN, HOSP PERFORMED
Amphetamines: NOT DETECTED
BARBITURATES: NOT DETECTED
Benzodiazepines: NOT DETECTED
Cocaine: NOT DETECTED
Opiates: NOT DETECTED
TETRAHYDROCANNABINOL: NOT DETECTED

## 2017-01-31 LAB — BODY FLUID CELL COUNT WITH DIFFERENTIAL
Lymphs, Fluid: 24 %
Monocyte-Macrophage-Serous Fluid: 41 % — ABNORMAL LOW (ref 50–90)
Neutrophil Count, Fluid: 35 % — ABNORMAL HIGH (ref 0–25)
Total Nucleated Cell Count, Fluid: 24 cu mm (ref 0–1000)

## 2017-01-31 LAB — SURGICAL PCR SCREEN
MRSA, PCR: NEGATIVE
Staphylococcus aureus: NEGATIVE

## 2017-01-31 LAB — LACTIC ACID, PLASMA
LACTIC ACID, VENOUS: 2.3 mmol/L — AB (ref 0.5–1.9)
LACTIC ACID, VENOUS: 1.8 mmol/L (ref 0.5–1.9)

## 2017-01-31 LAB — URINALYSIS, ROUTINE W REFLEX MICROSCOPIC
Glucose, UA: NEGATIVE mg/dL
HGB URINE DIPSTICK: NEGATIVE
Ketones, ur: NEGATIVE mg/dL
Leukocytes, UA: NEGATIVE
NITRITE: NEGATIVE
PROTEIN: NEGATIVE mg/dL
Specific Gravity, Urine: 1.013 (ref 1.005–1.030)
pH: 5 (ref 5.0–8.0)

## 2017-01-31 LAB — PROCALCITONIN: PROCALCITONIN: 1.22 ng/mL

## 2017-01-31 LAB — APTT: APTT: 39 s — AB (ref 24–36)

## 2017-01-31 LAB — ETHANOL: Alcohol, Ethyl (B): <5 mg/dL (ref <5)

## 2017-01-31 LAB — PROTIME-INR
INR: 1.54
Prothrombin Time: 18.6 seconds — ABNORMAL HIGH (ref 11.4–15.2)

## 2017-01-31 MED ORDER — FUROSEMIDE 20 MG PO TABS
20.0000 mg | ORAL_TABLET | Freq: Every day | ORAL | Status: DC
  Administered 2017-02-01: 20 mg via ORAL
  Filled 2017-01-31 (×2): qty 1

## 2017-01-31 MED ORDER — ALBUMIN HUMAN 25 % IV SOLN
25.0000 g | Freq: Once | INTRAVENOUS | Status: AC
  Administered 2017-01-31: 25 g via INTRAVENOUS
  Filled 2017-01-31: qty 100

## 2017-01-31 MED ORDER — PSYLLIUM 95 % PO PACK
1.0000 | PACK | Freq: Every day | ORAL | Status: DC
Start: 2017-01-31 — End: 2017-02-05
  Administered 2017-02-01: 1 via ORAL
  Filled 2017-01-31 (×6): qty 1

## 2017-01-31 MED ORDER — SPIRONOLACTONE 25 MG PO TABS
100.0000 mg | ORAL_TABLET | Freq: Every day | ORAL | Status: DC
  Administered 2017-02-01 – 2017-02-03 (×2): 100 mg via ORAL
  Filled 2017-01-31 (×3): qty 4

## 2017-01-31 MED ORDER — PREDNISOLONE 15 MG/5ML PO SOLN
40.0000 mg | Freq: Every day | ORAL | Status: DC
  Administered 2017-01-31 – 2017-02-05 (×5): 40 mg via ORAL
  Filled 2017-01-31 (×6): qty 15

## 2017-01-31 MED ORDER — PREDNISOLONE 15 MG/5ML PO SOLN
10.0000 mg | Freq: Every day | ORAL | Status: DC
  Filled 2017-01-31: qty 5

## 2017-01-31 MED ORDER — VANCOMYCIN HCL IN DEXTROSE 750-5 MG/150ML-% IV SOLN
750.0000 mg | Freq: Two times a day (BID) | INTRAVENOUS | Status: DC
  Filled 2017-01-31: qty 150

## 2017-01-31 MED ORDER — FOLIC ACID 1 MG PO TABS
1.0000 mg | ORAL_TABLET | Freq: Every day | ORAL | Status: DC
  Administered 2017-02-01 – 2017-02-05 (×4): 1 mg via ORAL
  Filled 2017-01-31 (×5): qty 1

## 2017-01-31 MED ORDER — PRO-STAT SUGAR FREE PO LIQD
30.0000 mL | Freq: Two times a day (BID) | ORAL | Status: DC
  Administered 2017-02-01 – 2017-02-05 (×8): 30 mL via ORAL
  Filled 2017-01-31 (×7): qty 30

## 2017-01-31 MED ORDER — ACETAMINOPHEN 650 MG RE SUPP: 650.0000 mg | Freq: Four times a day (QID) | RECTAL | Status: DC | PRN

## 2017-01-31 MED ORDER — ADULT MULTIVITAMIN W/MINERALS CH
1.0000 | ORAL_TABLET | Freq: Every day | ORAL | Status: DC
  Administered 2017-02-01 – 2017-02-05 (×4): 1 via ORAL
  Filled 2017-01-31 (×5): qty 1

## 2017-01-31 MED ORDER — ONDANSETRON HCL 4 MG/2ML IJ SOLN: 4.0000 mg | Freq: Four times a day (QID) | INTRAMUSCULAR | Status: DC | PRN

## 2017-01-31 MED ORDER — FLUCONAZOLE 200 MG PO TABS
200.0000 mg | ORAL_TABLET | Freq: Every day | ORAL | Status: DC
  Administered 2017-02-01 – 2017-02-03 (×2): 200 mg via ORAL
  Filled 2017-01-31 (×4): qty 1

## 2017-01-31 MED ORDER — LACTULOSE 10 GM/15ML PO SOLN
10.0000 g | Freq: Every day | ORAL | Status: DC
Start: 2017-01-31 — End: 2017-02-01
  Administered 2017-02-01: 10 g via ORAL
  Filled 2017-01-31 (×2): qty 15

## 2017-01-31 MED ORDER — ENSURE ENLIVE PO LIQD
237.0000 mL | Freq: Three times a day (TID) | ORAL | Status: DC
  Administered 2017-01-31 – 2017-02-05 (×13): 237 mL via ORAL

## 2017-01-31 MED ORDER — PANTOPRAZOLE SODIUM 40 MG PO TBEC
40.0000 mg | DELAYED_RELEASE_TABLET | Freq: Two times a day (BID) | ORAL | Status: DC
  Administered 2017-01-31 – 2017-02-05 (×10): 40 mg via ORAL
  Filled 2017-01-31 (×11): qty 1

## 2017-01-31 MED ORDER — ACETAMINOPHEN 325 MG PO TABS
650.0000 mg | ORAL_TABLET | Freq: Four times a day (QID) | ORAL | Status: DC | PRN
  Administered 2017-01-31: 650 mg via ORAL
  Filled 2017-01-31: qty 2

## 2017-01-31 MED ORDER — TRAZODONE HCL 50 MG PO TABS: 50.0000 mg | ORAL_TABLET | Freq: Every evening | ORAL | Status: DC | PRN

## 2017-01-31 MED ORDER — ALBUMIN HUMAN 25 % IV SOLN
50.0000 g | Freq: Once | INTRAVENOUS | Status: AC
  Administered 2017-01-31: 50 g via INTRAVENOUS
  Filled 2017-01-31: qty 200

## 2017-01-31 MED ORDER — VITAMIN B-1 100 MG PO TABS
100.0000 mg | ORAL_TABLET | Freq: Every day | ORAL | Status: DC
  Administered 2017-02-01 – 2017-02-05 (×4): 100 mg via ORAL
  Filled 2017-01-31 (×5): qty 1

## 2017-01-31 MED ORDER — ONDANSETRON HCL 4 MG PO TABS: 4.0000 mg | ORAL_TABLET | Freq: Four times a day (QID) | ORAL | Status: DC | PRN

## 2017-01-31 MED ORDER — TRAMADOL HCL 50 MG PO TABS
50.0000 mg | ORAL_TABLET | Freq: Four times a day (QID) | ORAL | Status: DC | PRN
  Administered 2017-01-31 – 2017-02-04 (×4): 50 mg via ORAL
  Filled 2017-01-31 (×5): qty 1

## 2017-01-31 MED ORDER — PREDNISOLONE SODIUM PHOSPHATE 15 MG/5ML PO SOLN: 5.0000 mg | Freq: Every day | ORAL | Status: DC

## 2017-01-31 MED ORDER — DEXTROSE 5 % IV SOLN
2.0000 g | Freq: Three times a day (TID) | INTRAVENOUS | Status: DC
  Administered 2017-01-31: 2 g via INTRAVENOUS
  Filled 2017-01-31 (×2): qty 2

## 2017-01-31 MED ORDER — VANCOMYCIN HCL IN DEXTROSE 1-5 GM/200ML-% IV SOLN
1000.0000 mg | Freq: Once | INTRAVENOUS | Status: AC
  Administered 2017-01-31: 1000 mg via INTRAVENOUS

## 2017-01-31 MED ORDER — DOCUSATE SODIUM 100 MG PO CAPS
100.0000 mg | ORAL_CAPSULE | Freq: Two times a day (BID) | ORAL | Status: DC
  Administered 2017-01-31 – 2017-02-05 (×5): 100 mg via ORAL
  Filled 2017-01-31 (×7): qty 1

## 2017-01-31 NOTE — Progress Notes (Signed)
Mercedes Mitchell is a 56 y.o. female patient. CD-IOP: Absent. The patient did not appear for group today. Upon further review, the patient was hospitalized early this morning at [facility] with complications related directly to the cirrhosis. We will wait to hear what the patient intends to do. She has missed 6 consecutive sessions and was expected back in group today. If she is capable of attending group, she certainly needs it. The patient has never had treatment and has been drinking heavily for many years. She will have to stay sober if she wants to stay alive.        Brandon Melnick, LCAS

## 2017-01-31 NOTE — Procedures (Addendum)
Ultrasound-guided diagnostic and therapeutic paracentesis performed yielding 5.5 liters of yellow  fluid. No immediate complications. A portion of the fluid was submitted to the laboratory for preordered studies.

## 2017-01-31 NOTE — Care Management Note (Signed)
Case Management Note  Patient Details  Name: Mercedes Mitchell MRN: [id number] Date of Birth: [birth year]  Subjective/Objective:                  56 y.o. female with medical history significant of alcoholic cirrhosis with ascites, anemia, and malnutrition presenting with abdominal pain.  She reports that she has a "thing" on her butt - like an abscess, extraordinarily painful (stage 2 pressure ulcer); not sleeping, "I've got all this fluid in my body."  Hard to get up and down due to ascites.  Swollen lymph nodes in neck "recently." Fluid in abdomen is worsening.  She went to an [place] last week and tripped and hit her head about 1 week ago, requiring sutures (recently removed).  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    ED Course: Cirrhosis, ascites, jaundice with subjective fever, chills, abdominal pain concerning for SBP.  Lactate 2.32 with tachycardia and hypotension.  WBC 41.6, worsening transaminitis.  Elevated ammonia to 102.  Diagnostic paracentesis, started on Rocephin.  Action/Plan: Date:  January 31, 2017 Chart reviewed for concurrent status and case management needs. Will continue to follow patient progress. Discharge Planning: following for needs Expected discharge date: [id number] Velva Harman, BSN, [place], [place]  Expected Discharge Date:                  Expected Discharge Plan:  Home/Self Care  In-House Referral:     Discharge planning Services  CM Consult  Post Acute Care Choice:    Choice offered to:     DME Arranged:    DME Agency:     HH Arranged:    HH Agency:     Status of Service:  In process, will continue to follow  If discussed at Long Length of Stay Meetings, dates discussed:    Additional Comments:  Leeroy Cha, RN 01/31/2017, 10:46 AM

## 2017-01-31 NOTE — Progress Notes (Signed)
Pharmacy Antibiotic Note  Mercedes Mitchell is a 56 y.o. female c/o abdominal pain admitted on 01/30/2017 with sepsis.  Pharmacy has been consulted for cefepime and vancomycin dosing.  Plan: Cefepime 2 Gm IV q8h Vancomycin 1 Gm x1 then 750 mg IV q12h VT=15-20 mg/L F/u scr/cultures/levels   Height: 5\' 6"  (167.6 cm) Weight: 114 lb (51.7 kg) IBW/kg (Calculated) : 59.3  Temp (24hrs), Avg:98.5 F (36.9 C), Min:98.3 F (36.8 C), Max:98.6 F (37 C)   Recent Labs Lab 01/30/17 1957 01/30/17 2117  WBC 41.6*  --   CREATININE 0.52  --   LATICACIDVEN  --  2.32*    Estimated Creatinine Clearance: 65.6 mL/min (by C-G formula based on SCr of 0.52 mg/dL).    No Known Allergies  Antimicrobials this admission: 6/10 rocephin >> x1 ED 6/11 vancomycin >>  6/11 cefepime >>  Dose adjustments this admission:   Microbiology results:  BCx:   UCx:    Sputum:    MRSA PCR:   Thank you for allowing pharmacy to be a part of this patient's care.  Dorrene German 01/31/2017 1:37 AM

## 2017-01-31 NOTE — Consult Note (Signed)
Mercedes Mitchell NOTE  Patient Care Team: [place], Betty G, MD as PCP - General (Family Medicine)  CHIEF COMPLAINTS/PURPOSE OF CONSULTATION:  Leukocytosis  HISTORY OF PRESENTING ILLNESS:  Mercedes Mitchell 56 y.o. female is here because of abdominal pain with recurrent ascites. Patient has a history of alcoholic cirrhosis with ascites and was admitted to the hospital for the abdominal pain. She has jaundice and has also elevated ammonia levels. She was initially confused and also was some renal insufficiency. These symptoms have improved. Yesterday she had paracentesis done with 5 L of fluid was removed and was sent for testing to rule out spontaneous bacterial peritonitis. The results are not yet available. We're consulted because of patient has had persistent leukocytosis. Today her CBC revealed a white blood cell count of 35.1. Differential was not obtained. Hemoglobin was 9.7. Previous white blood cell counts have been as high as 41,000. Elevation of white blood cell count has been going on for quite some time including a white count of 41.73 weeks ago which are primarily neutrophils. She had profound macrocytosis. On the differentials done previously 3 weeks ago she had primarily elevated neutrophils. We do not have a differential from the recent blood work since she got admitted to the hospital again Her husband was at the bedside. She doesn't that she is feeling a lot better since the paracentesis she denies any fevers or chills.  I reviewed her records extensively and collaborated the history with the patient.  MEDICAL HISTORY:  Past Medical History:  Diagnosis Date  . 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  paracentesis 2.6 liters 5/4: no SBP.    Marland Kitchen Alcoholism ([place]) 12/2016  . Dysphagia 2011  . Macrocytic anemia 12/2016  . Malnutrition ([place]) 12/2016  . Panic type anxiety neurosis 2011    SURGICAL  HISTORY: Past Surgical History:  Procedure Laterality Date  . ESOPHAGOGASTRODUODENOSCOPY N/A 12/25/2016   Procedure: ESOPHAGOGASTRODUODENOSCOPY (EGD);  Surgeon: Jerene Bears, MD;  Location: [facility];  Service: Endoscopy;  Laterality: N/A;    SOCIAL HISTORY: Social History   Social History  . Marital status: Married    Spouse name: N/A  . Number of children: N/A  . Years of education: N/A   Occupational History  . Unemployed    Social History Main Topics  . Smoking status: Former Smoker    Types: Cigarettes  . Smokeless tobacco: Never Used     Comment: Reports she quit "about 6 months ago"  . Alcohol use Yes     Comment: Last drink: 36 days ago   . Drug use: No     Comment: Last use Cocaine 11/17, reports last use "over a year", [facility] 2/18, reports "a year" ago  . Sexual activity: Not on file   Other Topics Concern  . Not on file   Social History Narrative   Married, children, not working    FAMILY HISTORY: Family History  Problem Relation Age of Onset  . Hyperlipidemia Father   . Heart disease Father     ALLERGIES:  has No Known Allergies.  MEDICATIONS:  Current Facility-Administered Medications  Medication Dose Route Frequency Provider Last Rate Last Dose  . acetaminophen (TYLENOL) tablet 650 mg  650 mg Oral Q6H PRN Karmen Bongo, MD   650 mg at 01/31/17 1726   Or  . acetaminophen (TYLENOL) suppository 650 mg  650 mg Rectal Q6H PRN Karmen Bongo, MD      .  docusate sodium (COLACE) capsule 100 mg  100 mg Oral BID Karmen Bongo, MD   100 mg at 01/31/17 0230  . feeding supplement (ENSURE ENLIVE) (ENSURE ENLIVE) liquid 237 mL  237 mL Oral TID BM Karmen Bongo, MD      . Derrill Memo ON 02/01/2017] feeding supplement (PRO-STAT SUGAR FREE 64) liquid 30 mL  30 mL Oral BID Hosie Poisson, MD      . fluconazole (DIFLUCAN) tablet 200 mg  200 mg Oral Daily Karmen Bongo, MD      . folic acid (FOLVITE) tablet 1 mg  1 mg Oral Daily Karmen Bongo, MD      . furosemide  (LASIX) tablet 20 mg  20 mg Oral Daily Karmen Bongo, MD      . lactulose ([place]) 10 GM/15ML solution 10 g  10 g Oral Daily Karmen Bongo, MD      . multivitamin with minerals tablet 1 tablet  1 tablet Oral Daily Karmen Bongo, MD      . ondansetron [facility]) tablet 4 mg  4 mg Oral Q6H PRN Karmen Bongo, MD       Or  . ondansetron [facility]) injection 4 mg  4 mg Intravenous Q6H PRN Karmen Bongo, MD      . pantoprazole (PROTONIX) EC tablet 40 mg  40 mg Oral BID Karmen Bongo, MD   40 mg at 01/31/17 0230  . prednisoLONE (PRELONE) 15 MG/5ML SOLN 40 mg  40 mg Oral QAC breakfast Karmen Bongo, MD   40 mg at 01/31/17 1115  . psyllium (HYDROCIL/METAMUCIL) packet 1 packet  1 packet Oral Daily Karmen Bongo, MD      . spironolactone (ALDACTONE) tablet 100 mg  100 mg Oral Daily Karmen Bongo, MD      . thiamine (VITAMIN B-1) tablet 100 mg  100 mg Oral Daily Karmen Bongo, MD      . traZODone (DESYREL) tablet 50 mg  50 mg Oral QHS PRN Karmen Bongo, MD        REVIEW OF SYSTEMS:  Frail thin lady in no acute distress Constitutional: Denies fevers, chills or abnormal night sweats Eyes: Denies blurriness of vision, double vision or watery eyes Ears, nose, mouth, throat, and face: Denies mucositis or sore throat Respiratory: Denies cough, dyspnea or wheezes Cardiovascular: Denies palpitation, chest discomfort or lower extremity swelling Gastrointestinal:  Abdominal pain and distention both of which improved after paracentesis Skin: Severely jaundiced Lymphatics: Denies new lymphadenopathy or easy bruising Neurological:Denies numbness, tingling or new weaknesses Behavioral/Psych: Mood is stable, no new changes  All other systems were reviewed with the patient and are negative.  PHYSICAL EXAMINATION: ECOG PERFORMANCE STATUS: 3 - Symptomatic, >50% confined to bed  Vitals:   01/31/17 1430 01/31/17 1435  BP: 101/71 103/73  Pulse:    Resp:    Temp:     Filed Weights   01/30/17  1925  Weight: 114 lb (51.7 kg)    GENERAL:alert, no distress and comfortable, Frail thin lady SKIN: Severe jaundice EYES: normal, conjunctiva are pink and non-injected, sclera clear OROPHARYNX:no exudate, no erythema and lips, buccal mucosa, and tongue normal  NECK: supple, thyroid normal size, non-tender, without nodularity LYMPH:  no palpable lymphadenopathy in the cervical, axillary or inguinal LUNGS: clear to auscultation and percussion with normal breathing effort HEART: regular rate & rhythm and no murmurs and no lower extremity edema ABDOMEN:Soft mild ascites Musculoskeletal:no cyanosis of digits and no clubbing  PSYCH: alert & oriented x 3 with fluent speech NEURO: no focal motor/sensory deficits  LABORATORY DATA:  I have reviewed the data as listed Lab Results  Component Value Date   WBC 35.1 (H) 01/31/2017   HGB 9.7 (L) 01/31/2017   HCT 27.3 (L) 01/31/2017   MCV 102.6 (H) 01/31/2017   PLT 187 01/31/2017   Lab Results  Component Value Date   NA 131 (L) 01/31/2017   K 4.5 01/31/2017   CL 100 (L) 01/31/2017   CO2 23 01/31/2017    RADIOGRAPHIC STUDIES: I have personally reviewed the radiological reports and agreed with the findings in the report.  ASSESSMENT AND PLAN:  Severe leukocytosis: I reviewed the peripheral smear for the patient and it showed primarily elevated neutrophil counts. I would like to obtain a differential for tomorrow's labs. Differential diagnosis between infection versus inflammation. Infectious causes including spontaneous pectoral peritonitis as well as the ulcer on her buttock about potential causes. We will await the results of the ascites fluid test result. However the goal would be to treat underlying cause.  Wound care will be consulted because the buttock ulcer. There is no further recommendations from hematology standpoint. All questions were answered. The patient knows to call the clinic with any problems, questions or concerns.     Rulon Eisenmenger, MD @T @

## 2017-01-31 NOTE — Telephone Encounter (Signed)
I had spoken to her last PM - had sore on anal area and t 99.1 - she was ? If ok to have liver bx as scheduled.I had told her probably but if worse go to ED which she did and was admitted w/ suspected SBP.

## 2017-01-31 NOTE — Consult Note (Addendum)
Consultation  Referring Provider:  Dr. Karleen Hampshire    Primary Care Physician:  Martinique, Betty G, MD Primary Gastroenterologist:   Dr. Hilarie Fredrickson      Reason for Consultation:  Decompensated alcoholic cirrhosis            HPI:   Mercedes Mitchell is a 56 y.o. female with past medical history of alcoholic hepatitis with ascites, alcoholism, dysphagia, anemia and others listed below who presented to the ED on 01/30/17 with a complaint of abdominal pain and "butt pain".    Today, the patient explains that she has continued to grow weaker and weaker over the past month, "I don't have the strength to do anything", and has noticed an increasing abdominal "tightness/discomfort". She has also noticed that her legs continued to get bigger and bigger with fluid. The patient tells me that she would have increasing abdominal pain if she tried to sit up or change positions and even had some slight chest pain when trying to get up and move around. Her husband is by her side and does help with her history. She has been taking her Lasix 20 mg daily and Spironolactone 100mg  daily as well as her Protonix 40 mg twice a day. She did see Dawn at the liver clinic last week and tells me that she was encouraging her to get a liver biopsy as soon as possible to "move things along". She does ask questions as to when this could be done.   Patient also tells me today that she developed a "butt pain", she tells me she has not been able to get up and move around and feels like it is just from "sitting too much". She has had softer than normal stools, but has seen no melena or bright red blood. She does tell me her urine has gotten "really dark".   Patient denies fever, chills, weight loss, anorexia, nausea, vomiting, heartburn or reflux.  ED Course: Cirrhosis, ascites, jaundice, abdominal pain concerning for SBP.  Lactate 2.32 with tachycardia and hypotension.  WBC 41.6, worsening transaminitis.  Elevated ammonia to 102.  Diagnostic  paracentesis, started on Rocephin.  Previous Gi history: -Recently patient has followed with liver clinic in town, [place], NP-C (we do not have notes) but was set up for transjugular liver bx last week per pt, this was scheduled today - 01/10/17-office visit, Tye Savoy, NP-C: Patient following up from recent hospital evaluation of coffee-ground emesis and severe alcoholic hepatitis with severe jaundice and large volume ascites, inpatient EGD as below, at time of appointment patient described feeling better but deconditioned and starting physical therapy soon, shape and watching [place] or food but not her beverages, procedure weight was stable, was still taking Diflucan and a PPI but did mention intermittent shooting pains in both sides of her midabdomen not related to eating, bowel movements or movement; plan: Repeat seen med, CBC, INR, arrange for paracentesis with albumin, fluid for cell count and cytology, plan was to increase diuretics if renal function is okay, repeat EGD in August to document healing, patient is also scheduled for a colonoscopy at that time due to her normocytic anemia -12/25/16-EGD, Dr. Hilarie Fredrickson: Impression: LA grade D reflux, likely candidiasis, acute and erosive esophagitis, 4 cm hiatal hernia, mild, nonbleeding portal hypertensive gastropathy, normal duodenum, no sign of varices; recommendations: Twice a day PPI, empiric therapy for Candida, repeat EGD in 12 weeks with colonoscopy -12/24/16-consult during hospital stay, Azucena Freed, PA-C/Dr. Hilarie Fredrickson: Patient severely malnourished, long history of heavy  daily alcohol use, cross-sectional imaging at that time showed hepatomegaly, large ascites, possibly hypodensity within are above the pancreatic head, abdominal ultrasound showed fatty liver, gallbladder sludge with stones without cholecystitis, concern for GI bleeding due to her history of coffee-ground emesis and dark heme positive stool, EGD arranged, paracentesis performed,  patient started prednisone 20 days for acute alcoholic hepatitis with a taper, repeat MRI/MRCP was recommended at some point to reevaluate pancreas and rule out pancreatic lesion  Past Medical History:  Diagnosis Date  . 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  paracentesis 2.6 liters 5/4: no SBP.    Marland Kitchen Alcoholism ([facility]) 12/2016  . Dysphagia 2011  . Macrocytic anemia 12/2016  . Malnutrition ([facility]) 12/2016  . Panic type anxiety neurosis 2011    Past Surgical History:  Procedure Laterality Date  . ESOPHAGOGASTRODUODENOSCOPY N/A 12/25/2016   Procedure: ESOPHAGOGASTRODUODENOSCOPY (EGD);  Surgeon: Jerene Bears, MD;  Location: [facility];  Service: Endoscopy;  Laterality: N/A;    Family History  Problem Relation Age of Onset  . Hyperlipidemia Father   . Heart disease Father    Social History  Substance Use Topics  . Smoking status: Former Smoker    Types: Cigarettes  . Smokeless tobacco: Never Used     Comment: Reports she quit "about 6 months ago"  . Alcohol use Yes     Comment: Last drink: 36 days ago     Prior to Admission medications   Medication Sig Start Date End Date Taking? Authorizing Provider  bacitracin ointment Apply 1 application topically 3 (three) times daily. 01/26/17  Yes Waynetta Pean, PA-C  feeding supplement, ENSURE ENLIVE, (ENSURE ENLIVE) LIQD Take 237 mLs by mouth 3 (three) times daily between meals. 12/28/16  Yes Mikhail, Velta Addison, DO  folic acid (FOLVITE) 1 MG tablet Take 1 tablet (1 mg total) by mouth daily. 12/29/16  Yes Mikhail, Maryann, DO  furosemide (LASIX) 20 MG tablet Take 1 tablet (20 mg total) by mouth daily. 12/29/16  Yes Mikhail, Maryann, DO  lactulose (CHRONULAC) 10 GM/15ML solution Take 15 mLs by mouth daily. 01/25/17  Yes [provider]  Multiple Vitamin (MULTIVITAMIN WITH MINERALS) TABS tablet Take 1 tablet by mouth daily. 12/29/16  Yes Mikhail, Maryann, DO  pantoprazole  (PROTONIX) 40 MG tablet Take 1 tablet (40 mg total) by mouth 2 (two) times daily. 12/28/16  Yes Mikhail, Velta Addison, DO  prednisoLONE (PRELONE) 15 MG/5ML SOLN 40mg  daily until 01/21/17 then taper down by 10mg  daily for 4 days. At 10mg  taper by 5mg  daily then 5 mg daily for 3 days 01/14/17  Yes Willia Craze, NP  psyllium (HYDROCIL/METAMUCIL) 95 % PACK Take 1 packet by mouth daily. 12/29/16  Yes Mikhail, Velta Addison, DO  spironolactone (ALDACTONE) 100 MG tablet Take 1 tablet (100 mg total) by mouth daily. 12/29/16  Yes Mikhail, Velta Addison, DO  traZODone (DESYREL) 50 MG tablet Take 1/2 tablet HS to start Increase to 1-2 tablets HS as needed for sleep 01/26/17  Yes Dara Hoyer, PA-C  fluconazole (DIFLUCAN) 200 MG tablet Take 1 tablet (200 mg total) by mouth daily. 12/29/16   Cristal Ford, DO  thiamine 100 MG tablet Take 1 tablet (100 mg total) by mouth daily. Patient not taking: Reported on 01/30/2017 12/29/16   Cristal Ford, DO    Current Facility-Administered Medications  Medication Dose Route Frequency Provider Last Rate Last Dose  . acetaminophen (TYLENOL) tablet 650 mg  650 mg Oral Q6H PRN  Karmen Bongo, MD       Or  . acetaminophen (TYLENOL) suppository 650 mg  650 mg Rectal Q6H PRN Karmen Bongo, MD      . ceFEPIme (MAXIPIME) 2 g in dextrose 5 % 50 mL IVPB  2 g Intravenous Q8H Dorrene German, [place] at 01/31/17 0701  . docusate sodium (COLACE) capsule 100 mg  100 mg Oral BID Karmen Bongo, MD   100 mg at 01/31/17 0230  . feeding supplement (ENSURE ENLIVE) (ENSURE ENLIVE) liquid 237 mL  237 mL Oral TID BM Karmen Bongo, MD      . fluconazole (DIFLUCAN) tablet 200 mg  200 mg Oral Daily Karmen Bongo, MD      . folic acid (FOLVITE) tablet 1 mg  1 mg Oral Daily Karmen Bongo, MD      . furosemide (LASIX) tablet 20 mg  20 mg Oral Daily Karmen Bongo, MD      . lactulose ([place]) 10 GM/15ML solution 10 g  10 g Oral Daily Karmen Bongo, MD      . multivitamin with minerals  tablet 1 tablet  1 tablet Oral Daily Karmen Bongo, MD      . ondansetron [facility]) tablet 4 mg  4 mg Oral Q6H PRN Karmen Bongo, MD       Or  . ondansetron [facility] injection 4 mg  4 mg Intravenous Q6H PRN Karmen Bongo, MD      . pantoprazole (PROTONIX) EC tablet 40 mg  40 mg Oral BID Karmen Bongo, MD   40 mg at 01/31/17 0230  . prednisoLONE (PRELONE) 15 MG/5ML SOLN 40 mg  40 mg Oral QAC breakfast Karmen Bongo, MD      . psyllium (HYDROCIL/METAMUCIL) packet 1 packet  1 packet Oral Daily Karmen Bongo, MD      . spironolactone (ALDACTONE) tablet 100 mg  100 mg Oral Daily Karmen Bongo, MD      . thiamine (VITAMIN B-1) tablet 100 mg  100 mg Oral Daily Karmen Bongo, MD      . traZODone (DESYREL) tablet 50 mg  50 mg Oral QHS PRN Karmen Bongo, MD      . vancomycin (VANCOCIN) IVPB 750 mg/150 ml premix  750 mg Intravenous Q12H Dorrene German, RPH        Allergies as of 01/30/2017  . (No Known Allergies)     Review of Systems:    Constitutional: No weight loss Skin: Ulcer on Buttock Cardiovascular: Positive for c/p Respiratory:Positive for DOE Gastrointestinal: See HPI and otherwise negative Genitourinary: Darker color urine Neurological: No headache Musculoskeletal: No new muscle or joint pain Hematologic: No bleeding  Psychiatric: Positive h/o anxiety   Physical Exam:  Vital signs in last 24 hours: Temp:  [97.3 F (36.3 C)-98.6 F (37 C)] 98.2 F (36.8 C) (06/11 0535) Pulse Rate:  [95-124] 97 (06/11 0535) Resp:  [16-19] 19 (06/11 0535) BP: (95-112)/(68-80) 104/77 (06/11 0535) SpO2:  [94 %-98 %] 97 % (06/11 0535) Weight:  [114 lb (51.7 kg)] 114 lb (51.7 kg) (06/10 1925) Last BM Date: 01/30/17 General:   Thin appearing, jaundiced female appears to be in NAD, Well developed,malnourished, alert and cooperative Head:  Normocephalic and atraumatic. Eyes:   PEERL, EOMI. Scleral icterus Conjunctiva pink. Ears:  Normal auditory acuity. Neck:  Supple Throat:  Oral cavity and pharynx without inflammation, swelling or lesion.  Lungs: Respirations even and unlabored. Lungs clear to auscultation bilaterally.   No wheezes, crackles, or rhonchi.  Heart: Normal S1, S2.  No MRG. Regular rate and rhythm. 3+ pitting lower extremity edema Abdomen:  Tight, marked ascites, positive fluid wave, generalized abdominal tenderness, No rebound or guarding. Decreased BS. No appreciable masses or hepatomegaly. Rectal:  Not performed. Sacral pressure ulcer Msk:  Symmetrical without gross deformities. Peripheral pulses intact.  Extremities:  No deformity or joint abnormality. Normal ROM, normal sensation. Neurologic:  Alert and oriented x4;  grossly normal neurologically. CN II-XII intact. No asterixis Skin:   Dry, pressure ulcer as above Psychiatric: Demonstrates good judgement and reason without abnormal affect or behaviors.   LAB RESULTS:  Recent Labs  01/30/17 1957 01/31/17 0630  WBC 41.6* 35.1*  HGB 11.8* 9.7*  HCT 32.8* 27.3*  PLT 226 187   BMET  Recent Labs  01/30/17 1957 01/31/17 0630  NA 129* 131*  K 5.2* 4.5  CL 99* 100*  CO2 21* 23  GLUCOSE 129* 99  BUN 22* 23*  CREATININE 0.52 0.50  CALCIUM 8.1* 8.1*   LFT  Recent Labs  01/31/17 0630  PROT 5.0*  ALBUMIN 2.4*  AST 80*  ALT 82*  ALKPHOS 498*  BILITOT 11.3*   PT/INR  Recent Labs  01/31/17 0216  LABPROT 18.6*  INR 1.54    STUDIES: Dg Chest Port 1 View  Result Date: 01/31/2017 CLINICAL DATA:  Sepsis EXAM: PORTABLE CHEST 1 VIEW COMPARISON:  None. FINDINGS: No focal consolidation or pleural effusion. Normal heart size. No pneumothorax. Circular opacity over the right neck is likely an artifact. IMPRESSION: Low lung volumes.  No acute infiltrate or edema. Electronically Signed   By: Donavan Foil M.D.   On: 01/31/2017 02:02   CT ABDOMEN AND PELVIS WITH CONTRAST 12/23/16  TECHNIQUE: Multidetector CT imaging of the abdomen and pelvis was performed using the standard protocol  following bolus administration of intravenous contrast.  CONTRAST:  79mL ISOVUE-300 IOPAMIDOL (ISOVUE-300) INJECTION 61%  COMPARISON:  None.  FINDINGS: LOWER CHEST: Small bilateral pleural effusions. Heart size is normal. No pericardial effusion.  HEPATOBILIARY: The liver is at least 25 cm in cranial caudad dimension and diffusely, heterogeneously hypodense. Edema at the porta hepatis. Normal gallbladder.  PANCREAS: Irregularity 13 x 19 mm hypodensity within or above the pancreatic head without ductal dilatation, pseudocyst or calcifications.  SPLEEN: Normal.  ADRENALS/URINARY TRACT: Kidneys are orthotopic, demonstrating symmetric enhancement. No nephrolithiasis, hydronephrosis or solid renal masses. The unopacified ureters are normal in course and caliber. Delayed imaging through the kidneys demonstrates symmetric prompt contrast excretion within the proximal urinary collecting system. Urinary bladder is partially distended and unremarkable. Normal adrenal glands.  STOMACH/BOWEL: Small hiatal hernia. The stomach, small and large bowel are normal in course and caliber without inflammatory changes. Mild colonic diverticulosis.  VASCULAR/LYMPHATIC: Aortoiliac vessels are normal in course and caliber, trace calcific atherosclerosis. No lymphadenopathy by CT size criteria.  REPRODUCTIVE: Normal.  OTHER: Large volume ascites, without flattening in the inferior vena cava. No intraperitoneal free air. Mild mesenteric edema without omental or mesenteric mass.  MUSCULOSKELETAL: Mild anasarca.  IMPRESSION: Severe hypodense hepatomegaly concerning for acute hepatitis. Large volume ascites. No biliary obstruction.  Focal hypodensity within or above the pancreatic head, difficult to further characterize due to extensive edema at the porta hepatis. Recommend close attention on follow-up imaging.  Small pleural effusions.   Electronically Signed   By:  Elon Alas M.D.   On: 12/23/2016 19:25   PREVIOUS ENDOSCOPIES:            See HPI   Impression / Plan:   Impression: 1. Decompensated  alcoholic hepatitis with portal HTN and severe esophagitis: recently admitted 5/3-5/8 for decompensated cirrhosis, Prednisolone started then, patient had started taper recently, has been increased back to 40mg  qd, MELD 27, pt following with Dawn at [facility] in town, seen last week 2. Alcohol abuse: prior use of 2/3 of 1/5th of vodka daily-37 days sober, etoh level <5 at time of admission 3. Anemia: hgb 11.8 at time of admission-->9.7 today , 13.4 previously 01/18/17, recent EGD with severe esophagitis, plans for repeat in 12 weeks with colonoscopy 4. SBP?: peritoneal fluid 90% neutrophils (does not appear to have a culture ordered for that fluid), WBC 41.6 at time of admission, Lactate 2.32, on IV cefepime and Vanc to cover for this/ cause for sepsis, abdominal pain likely related to tense ascites 5. Abnormal CT of abdomen: As above on 12/23/16 during last hospitalization admission, focal hypodensity within or above the pancreatic head which was difficult to further characterize due to extensive edema at the porta hepatis; imagine this would continue to be difficult to characterize at this time due to patient's current presentation, but this will need to be followed up in the future with MRI/MRCP  Plan: 1. Continue BID PPI 2. Agree with restarting Prednisolone 40mg  qd  3. Agree with empiric abx See below Gatha Mayer, MD, [facility]  4. Agree with IR paracentesis- they have recommended waiting on liver bx due to possible concern for SBP at this time-discussed with patient 5. Continue Lasix 20mg  qd and Spironolactone 100mg  qd 6. Continue ETOH abstinence 7. She is scheduled in our office for repeat EGD for surveillance and Colonoscopy on 03/24/17 with Dr. Hilarie Fredrickson 8. Patient also had recent abnormal imaging with CT scan during last hospitalization,  recommendations for repeat MRI/MRCP for further evaluation of possible pancreatic mass, this will need to be scheduled in the future 9. Please await further recommendations from Dr. Carlean Purl later today  Thank you for your kind consultation, we will continue to follow.  Lavone Nian [facility]  01/31/2017, 8:49 AM Pager #: [phone]    [facility] Attending   I have taken an interval history, reviewed the chart and examined the patient. I agree with the Advanced Practitioner's note, impression and recommendations.   Additional thoughts:  1) Probably not septic or infected and does not meet criteria for SBP with only 68 PMN's total (90% of 75 WBC total). -  dc Abx - done 2) Leukocytosis from steroids I bet 3) Needs large volume paracentesis and hold on liver bx but possibly later this week. 4) will f/u - suspect imaging of ? Pancreatic lesion not optimal now 5) will have dietitian f/u also - I am certain intake decreased w/ ascites compressing her stomach 6) Has severe protein calorie malnutrition also.    I appreciate the opportunity to care for her  Gatha Mayer, MD, [facility] [phone] (pager) [phone] after 5 PM, weekends and holidays  01/31/2017 10:36 AM

## 2017-01-31 NOTE — Care Management (Signed)
Spoke with Dr. Lindi Adie with Hematology/Oncology who has agreed to see the patient in consult per Dr. Celesta Aver request for Leukocytosis and Lymphadenopathy.  Mercedes Newer, PA-C [facility]

## 2017-01-31 NOTE — Progress Notes (Signed)
PROGRESS NOTE    Mercedes Mitchell  QXI:[id number] DOB: 1961-06-10 DOA: 01/30/2017 PCP: [place], Betty G, MD    Brief Narrative: Mercedes Mitchell is a 56 y.o. female with medical history significant of alcoholic cirrhosis with ascites, anemia, and malnutrition presenting with abdominal pain. As per the patient, her abd pain and discomfort has worsened the last few days, after being discharged from the hospital. She was scheduled for liver biopsy this week but it has been postponed in view of the recurrent ascites and abd discomfort.   Assessment & Plan:   Active Problems:   Acute alcoholic hepatitis   Protein-calorie malnutrition, severe   Alcoholic cirrhosis of liver with ascites (HCC)   Macrocytic anemia   Sepsis (HCC)  Abdominal pain and worsening abd distention: secondary to recurrent ascites and possible sbp, which is from Acute alcoholic hepatitis with liver cirrhosis:  Admitted for IV antibiotics, IV pain control and possible paracentesis .  She underwent both diag and therapeutic paracentesis, . Her neutrophil count is 90 which is not consistent with SBP,. Her gram stain is negative. And cultures have been negative so far.  GI consulted on admission, have stopped the antibiotics.  Recommended postponing the liver biopsy to next week.  Diuresis with lasix and spironolactone and nutrition consult.    Severe protein calorie malnutrition:  Dietary consult.     Macrocytic anemia:  Baseline hemoglobin around 11.  Continue to monitor.    Leukocytosis: possibly reactive from prednisone.  Monitor. cxr no infiltrate, UA is negative. Paracentesis,does not indicate SBP. Afebrile.  Currently no signs of infection.     Pressure ulcer:  Get wound care consult for recommendations.      DVT prophylaxis: SCD;S Code Status: FULL CODE.  Family Communication: husband at bedside.  Disposition Plan:  Home in the next 1 to2 days after PT evaluates her.    Consultants:   GI  Procedures: (THERAPEUTIC AND DIAG PARACENTESIS.    Antimicrobials: one dose of rocephin.    Subjective: Wants to eat. abd discomfort persistent, saw her prior to therapeutic paracentesis  Objective: Vitals:   01/31/17 1415 01/31/17 1421 01/31/17 1430 01/31/17 1435  BP: 110/71 100/71 101/71 103/73  Pulse:      Resp:      Temp:      TempSrc:      SpO2:      Weight:      Height:        Intake/Output Summary (Last 24 hours) at 01/31/17 1511 Last data filed at 01/31/17 0631  Gross per 24 hour  Intake              600 ml  Output                0 ml  Net              600 ml   Filed Weights   01/30/17 1925  Weight: 51.7 kg (114 lb)    Examination:  General exam: Appears in mild distress from abdominal distention.  Respiratory system: diminished at bases secondary to abd distention.  Cardiovascular system: S1 & S2 heard, RRR.  Gastrointestinal system: Abdomen is distended, firm mildly gen tenderness present. Bowel sounds normal.  Central nervous system: Alert and oriented. No focal neurological deficits. Extremities: 2+ lower extremity edema.  Psychiatry: mood appropriate.     Data Reviewed: I have personally reviewed following labs and imaging studies  CBC:  Recent Labs Lab 01/30/17 1957 01/31/17 0630  WBC 41.6*  35.1*  HGB 11.8* 9.7*  HCT 32.8* 27.3*  MCV 102.5* 102.6*  PLT 226 297   Basic Metabolic Panel:  Recent Labs Lab 01/30/17 1957 01/31/17 0630  NA 129* 131*  K 5.2* 4.5  CL 99* 100*  CO2 21* 23  GLUCOSE 129* 99  BUN 22* 23*  CREATININE 0.52 0.50  CALCIUM 8.1* 8.1*   GFR: Estimated Creatinine Clearance: 65.6 mL/min (by C-G formula based on SCr of 0.5 mg/dL). Liver Function Tests:  Recent Labs Lab 01/30/17 1957 01/31/17 0630  AST 115* 80*  ALT 108* 82*  ALKPHOS 646* 498*  BILITOT 14.0* 11.3*  PROT 5.3* 5.0*  ALBUMIN 2.1* 2.4*    Recent Labs Lab 01/30/17 1957  LIPASE 37    Recent Labs Lab 01/30/17 2140  AMMONIA  102*   Coagulation Profile:  Recent Labs Lab 01/31/17 0216  INR 1.54   Cardiac Enzymes: No results for input(s): CKTOTAL, CKMB, CKMBINDEX, TROPONINI in the last 168 hours. BNP (last 3 results) No results for input(s): PROBNP in the last 8760 hours. HbA1C: No results for input(s): HGBA1C in the last 72 hours. CBG: No results for input(s): GLUCAP in the last 168 hours. Lipid Profile: No results for input(s): CHOL, HDL, LDLCALC, TRIG, CHOLHDL, LDLDIRECT in the last 72 hours. Thyroid Function Tests: No results for input(s): TSH, T4TOTAL, FREET4, T3FREE, THYROIDAB in the last 72 hours. Anemia Panel: No results for input(s): VITAMINB12, FOLATE, FERRITIN, TIBC, IRON, RETICCTPCT in the last 72 hours. Sepsis Labs:  Recent Labs Lab 01/30/17 2117 01/31/17 0216 01/31/17 0630  PROCALCITON  --  1.22  --   LATICACIDVEN 2.32* 2.3* 1.8    Recent Results (from the past 240 hour(s))  Surgical PCR screen     Status: None   Collection Time: 01/31/17  2:08 AM  Result Value Ref Range Status   MRSA, PCR NEGATIVE NEGATIVE Final   Staphylococcus aureus NEGATIVE NEGATIVE Final    Comment:        The Xpert SA Assay (FDA approved for NASAL specimens in patients over 48 years of age), is one component of a comprehensive surveillance program.  Test performance has been validated by [facility] for patients greater than or equal to 44 year old. It is not intended to diagnose infection nor to guide or monitor treatment.          Radiology Studies: US Paracentesis  Result Date: 01/31/2017 INDICATION: Alcoholic hepatitis, portal hypertension, recurrent ascites. Request made for diagnostic and therapeutic paracentesis. EXAM: ULTRASOUND GUIDED DIAGNOSTIC AND THERAPEUTIC PARACENTESIS MEDICATIONS: None. COMPLICATIONS: None immediate. PROCEDURE: Informed written consent was obtained from the patient after a discussion of the risks, benefits and alternatives to treatment. A timeout was performed  prior to the initiation of the procedure. Initial ultrasound scanning demonstrates a large amount of ascites within the right lower abdominal quadrant. The right lower abdomen was prepped and draped in the usual sterile fashion. 1% lidocaine was used for local anesthesia. Following this, a Yueh catheter was introduced. An ultrasound image was saved for documentation purposes. The paracentesis was performed. The catheter was removed and a dressing was applied. The patient tolerated the procedure well without immediate post procedural complication. FINDINGS: A total of approximately 5.5 liters of yellow fluid was removed. Samples were sent to the laboratory as requested by the clinical team. IMPRESSION: Successful ultrasound-guided diagnostic and therapeutic paracentesis yielding 5.5 liters of peritoneal fluid. Read by: Rowe Robert, PA-C Electronically Signed   By: Corrie Mckusick D.O.   On: 01/31/2017 14:47  Dg Chest Port 1 View  Result Date: 01/31/2017 CLINICAL DATA:  Sepsis EXAM: PORTABLE CHEST 1 VIEW COMPARISON:  None. FINDINGS: No focal consolidation or pleural effusion. Normal heart size. No pneumothorax. Circular opacity over the right neck is likely an artifact. IMPRESSION: Low lung volumes.  No acute infiltrate or edema. Electronically Signed   By: Donavan Foil M.D.   On: 01/31/2017 02:02        Scheduled Meds: . docusate sodium  100 mg Oral BID  . feeding supplement (ENSURE ENLIVE)  237 mL Oral TID BM  . [START ON 02/01/2017] feeding supplement (PRO-STAT SUGAR FREE 64)  30 mL Oral BID  . fluconazole  200 mg Oral Daily  . folic acid  1 mg Oral Daily  . furosemide  20 mg Oral Daily  . lactulose  10 g Oral Daily  . multivitamin with minerals  1 tablet Oral Daily  . pantoprazole  40 mg Oral BID  . prednisoLONE  40 mg Oral QAC breakfast  . psyllium  1 packet Oral Daily  . spironolactone  100 mg Oral Daily  . thiamine  100 mg Oral Daily   Continuous Infusions: . albumin human        LOS: 0 days    Time spent: 40 minutes. 50% of time spent with talking to the patient and family.     Hosie Poisson, MD [facility] Pager [phone]  If 7PM-7AM, please contact night-coverage [email or web address] Password [email or web address] 01/31/2017, 3:12 PM

## 2017-01-31 NOTE — Progress Notes (Signed)
Initial Nutrition Assessment  DOCUMENTATION CODES:   Underweight  INTERVENTION:   Continue Ensure Enlive po TID, each supplement provides 350 kcal and 20 grams of protein Provide Prostat liquid protein PO 30 ml BID with meals, each supplement provides 100 kcal, 15 grams protein. Encourage PO intake  RD to continue to monitor  NUTRITION DIAGNOSIS:   Increased nutrient needs related to chronic illness, wound healing (ETOH cirrhosis) as evidenced by estimated needs.  GOAL:   Patient will meet greater than or equal to 90% of their needs  MONITOR:   PO intake, Supplement acceptance, Labs, Weight trends, I & O's  REASON FOR ASSESSMENT:   Malnutrition Screening Tool    ASSESSMENT:   56 y.o. female with past medical history of alcoholic hepatitis with ascites, alcoholism, dysphagia, anemia and others listed below who presented to the ED on 01/30/17 with a complaint of abdominal pain and "butt pain".  Patient not in room at time of visit. Per RN, pt is down for paracentesis. Pt NPO for that procedure but will be placed on a 2GMNA diet afterwards. Pt reported to RN that she is very hungry.  Per previous admission in May 2018, pt was followed by nutrition staff and provided diet education. Since that admission, pt has not been eating that well per chart. Pt has developed a pressure injury.  Pt has been ordered ensure supplements. RD ordered pt some additional protein supplements as well.   Per chart review, weight is stable.  Unable to complete Nutrition-Focused physical exam at this time. Suspect at follow-up to find severe muscle and fat depletion given results from 12/24/16.  Medications: colace capsule BID, folic acid tablet daily, Lasix tablet daily, Multivitamin with minerals daily, thiamine tablet daily Labs reviewed: Low K  Diet Order:  Diet NPO time specified  Skin:  Wound (see comment) (5/30 head laceration)  Last BM:  6/10  Height:   Ht Readings from Last 1  Encounters:  01/30/17 5\' 6"  (1.676 m)    Weight:   Wt Readings from Last 1 Encounters:  01/30/17 114 lb (51.7 kg)    Ideal Body Weight:  59.1 kg  BMI:  Body mass index is 18.4 kg/m.  Estimated Nutritional Needs:   Kcal:  1550-1750  Protein:  75-85g  Fluid:  1.8L/day  EDUCATION NEEDS:   No education needs identified at this time  Clayton Bibles, MS, RD, LDN Pager: [phone] After Hours Pager: [phone]

## 2017-01-31 NOTE — ED Provider Notes (Signed)
Medical screening examination/treatment/procedure(s) were conducted as a shared visit with non-physician practitioner(s) and myself.  I personally evaluated the patient during the encounter. Briefly, the patient is a 56 y.o. female with a history of alcoholic cirrhosis who presents to the ED with diffuse abdominal pain. Presentation concerning for SBP. Labs with elevated lactic acid, worsening leukocytosis, worsening transaminitis, elevated ammonia.  Paracentesis performed and peritoneal fluid sent for analysis. Patient started on antibiotics for SBP.  Patient also with mild hepatic encephalopathy.  Admitted to medicine for further workup and management. .    EKG Interpretation  Date/Time:  Sunday January 30 2017 19:38:36 EDT Ventricular Rate:  124 PR Interval:    QRS Duration: 112 QT Interval:  307 QTC Calculation: 441 R Axis:   28 Text Interpretation:  Sinus tachycardia Borderline intraventricular conduction delay Low voltage, precordial leads No significant change since last tracing Confirmed by Addison Lank [phone]) on 01/30/2017 11:21:46 PM           Chole Driver, Grayce Sessions, MD 01/31/17 0010

## 2017-01-31 NOTE — Progress Notes (Signed)
Patient ID: Mercedes Mitchell, female   DOB: [birth year], 56 y.o.   MRN: [id number] Patient was originally scheduled for transjugular liver biopsy this morning as an outpatient. Recent notes reviewed by Dr. Earleen Newport. At this time recommend holding on liver biopsy until appropriately treated for presumed SBP. We can reschedule her as an outpatient or if absolutely necessary perform bx closer to planned date of discharge. Dr. Earleen Newport will discuss above with [name] ( hepatology). Page Dr. Earleen Newport at [phone] with any questions.

## 2017-02-01 LAB — COMPREHENSIVE METABOLIC PANEL
ALBUMIN: 2.5 g/dL — AB (ref 3.5–5.0)
ALK PHOS: 441 U/L — AB (ref 38–126)
ALT: 62 U/L — ABNORMAL HIGH (ref 14–54)
AST: 57 U/L — ABNORMAL HIGH (ref 15–41)
Anion gap: 8 (ref 5–15)
BILIRUBIN TOTAL: 10.3 mg/dL — AB (ref 0.3–1.2)
BUN: 22 mg/dL — ABNORMAL HIGH (ref 6–20)
CALCIUM: 8 mg/dL — AB (ref 8.9–10.3)
CO2: 23 mmol/L (ref 22–32)
Chloride: 100 mmol/L — ABNORMAL LOW (ref 101–111)
Creatinine, Ser: 0.44 mg/dL (ref 0.44–1.00)
GFR calc Af Amer: >60 mL/min (ref >60)
GLUCOSE: 123 mg/dL — AB (ref 65–99)
Potassium: 4 mmol/L (ref 3.5–5.1)
Sodium: 131 mmol/L — ABNORMAL LOW (ref 135–145)
TOTAL PROTEIN: 4.6 g/dL — AB (ref 6.5–8.1)

## 2017-02-01 LAB — CBC WITH DIFFERENTIAL/PLATELET
BASOS PCT: 0 %
Basophils Absolute: 0 10*3/uL (ref 0.0–0.1)
Eosinophils Absolute: 0 10*3/uL (ref 0.0–0.7)
Eosinophils Relative: 0 %
HEMATOCRIT: 25.2 % — AB (ref 36.0–46.0)
HEMOGLOBIN: 9.1 g/dL — AB (ref 12.0–15.0)
LYMPHS ABS: 1.2 10*3/uL (ref 0.7–4.0)
Lymphocytes Relative: 4 %
MCH: 37.4 pg — ABNORMAL HIGH (ref 26.0–34.0)
MCHC: 36.1 g/dL — AB (ref 30.0–36.0)
MCV: 103.7 fL — AB (ref 78.0–100.0)
MONO ABS: 1.2 10*3/uL — AB (ref 0.1–1.0)
MONOS PCT: 4 %
Neutro Abs: 27.1 10*3/uL — ABNORMAL HIGH (ref 1.7–7.7)
Neutrophils Relative %: 92 %
Platelets: 161 10*3/uL (ref 150–400)
RBC: 2.43 MIL/uL — AB (ref 3.87–5.11)
RDW: 15.3 % (ref 11.5–15.5)
WBC: 29.5 10*3/uL — ABNORMAL HIGH (ref 4.0–10.5)

## 2017-02-01 LAB — PATHOLOGIST SMEAR REVIEW

## 2017-02-01 MED ORDER — GERHARDT'S BUTT CREAM
TOPICAL_CREAM | Freq: Four times a day (QID) | CUTANEOUS | Status: AC
  Administered 2017-02-01 – 2017-02-03 (×7): via TOPICAL
  Administered 2017-02-03: 1 via TOPICAL
  Filled 2017-02-01: qty 1

## 2017-02-01 MED ORDER — LACTULOSE 10 GM/15ML PO SOLN
10.0000 g | Freq: Two times a day (BID) | ORAL | Status: DC
  Administered 2017-02-01 – 2017-02-03 (×3): 10 g via ORAL
  Filled 2017-02-01 (×3): qty 15

## 2017-02-01 NOTE — Progress Notes (Addendum)
[facility] Progress Note  Chief Complaint:   Buttock pain , abdominal distention / discomfort  Subjective: Feels much better after paracentesis. Eating lunch  Objective:  Vital signs in last 24 hours: Temp:  [97.8 F (36.6 C)-98.3 F (36.8 C)] 97.8 F (36.6 C) (06/12 0900) Pulse Rate:  [77-87] 87 (06/12 0900) Resp:  [18] 18 (06/12 0900) BP: (90-111)/(69-74) 90/72 (06/12 0900) SpO2:  [97 %-98 %] 98 % (06/12 0900) Last BM Date: 01/31/17 General:   Alert, thin white female in NAD EENT:  Normal hearing, non icteric sclera, conjunctive pink.  Heart:  Regular rate and rhythm; no lower extremity edema Pulm: Normal respiratory effort, lungs CTA bilaterally without wheezes or crackles. Abdomen:  Soft, mildly distended, nontender.  Normal bowel sounds Neurologic:  Alert and  oriented x4;  grossly normal neurologically. Psych:  Alert and cooperative. Normal mood and affect.  Lab Results:  Recent Labs  01/30/17 1957 01/31/17 0630 02/01/17 0845  WBC 41.6* 35.1* 29.5*  HGB 11.8* 9.7* 9.1*  HCT 32.8* 27.3* 25.2*  PLT 226 187 161   BMET  Recent Labs  01/30/17 1957 01/31/17 0630 02/01/17 0845  NA 129* 131* 131*  K 5.2* 4.5 4.0  CL 99* 100* 100*  CO2 21* 23 23  GLUCOSE 129* 99 123*  BUN 22* 23* 22*  CREATININE 0.52 0.50 0.44  CALCIUM 8.1* 8.1* 8.0*   LFT  Recent Labs  02/01/17 0845  PROT 4.6*  ALBUMIN 2.5*  AST 57*  ALT 62*  ALKPHOS 441*  BILITOT 10.3*   Info from [name], NP [facility] - + smooth mm Ab CA 19-9 = 60    Assessment / Plan:  1. 56 yo female hospitalized early May with coffee ground emesis, anemia and severe ETOH hepatitis with ascites. Had a couple of liters of ascitic fluid removed, no SBP.  We have been following her outpatient since discharge. Treated with diuretics / Prednisolone without improvement.  Another LVP a couple of weeks ago. No SBP. WBC has been extremely elevated felt to be out of proportion to  steroids .Referred to liver clinic, saw [name], NP who recommended liver biopsy.  -back on 40 mg of Prednisolone. LFTs have shown some mild improvement -she had another 5 liter LVP yesterday. No SBP ( WBC 24, neutrophil 35%). Antibiotics stopped. -transjugular liver bx was postponed because patient thought to have SBP. I have paged IR to clarify. Spoke with Adolm Joseph, P.A. IR will put her back on schedule to be done in next couple of days.  2. Leukocytosis, appreciate Hematology's help. Suspicion is infection vrs inflammation. Ascitic fluid cell count negative for SBP. Ulcer on buttock could be source though her white count has been elevated for a few weeks (prior to ulcer)  3. Severe candida esophagitis / portal gastropathy on inpatient EGD in April, treated with diflucan. Already scheduled for follow up EGD to evaluate healing.   4. Macrocytic anemia. Inpatient EGD in April >>>severe candida esophagitis. s above. She is already scheduled for a colonoscopy and repeat EGD   5. Severe protein calorie malnutrition - supplements per dietitioan (thanks)  LOS: 1 day   Tye Savoy MP 02/01/2017, 11:20 AM  Pager number [phone]    [facility] Attending   I have taken an interval history, reviewed the chart and examined the patient. I agree with the Advanced Practitioner's note, impression and recommendations.   Complicated patient not toxic but severely ill with multiple problems. She is better  today after paracentesis. Wound care helping decubitus. She will get liver bx tomorrow. Potential liver transplant candidate.  Gatha Mayer, MD, [facility] [phone] (pager) [phone] after 5 PM, weekends and holidays  02/01/2017 1:31 PM

## 2017-02-01 NOTE — Consult Note (Addendum)
Mercedes Mitchell Nurse wound consult note Reason for Consult: Pressure Injury Wound type:Pressure Pressure Injury POA: Yes Measurement: 1cm x 0.6cm with depth obscured by the presence of yellow adherent slough Wound bed: See above Drainage (amount, consistency, odor) none Periwound: periwound erythema measuring 2cm circumferentially. Blanching. Dressing procedure/placement/frequency: I will implement a POC to reduce pressure in the sitting position by implementing a pressure redistribution chair pad and have her turning side to side while in bed and avoiding the supine position.  I will recommend application of a 8:3:0 compounded ointment containing zinc oxide:hydrocortisone cream:lotrimin cream ot the unstageable pressure injury for 10 days. A mattress replacement with low air loss feature is also provided. [facility] nursing team will not follow, but will remain available to this patient, the nursing and medical teams.  Please re-consult if needed. Thanks, [name], MSN, RN, [name], [name]  Pager# [phone]

## 2017-02-01 NOTE — Progress Notes (Signed)
Nutrition Follow-up  DOCUMENTATION CODES:   Severe malnutrition in context of chronic illness, Underweight  INTERVENTION:   -Continue Ensure Enlive po BID, each supplement provides 350 kcal and 20 grams of protein -Continue Prostat liquid protein PO 30 ml BID with meals, each supplement provides 100 kcal, 15 grams protein. -Provided "Low Sodium Nutrition Therapy" handout from [employer]. -Change diet order to [facility] diet.  -RD to continue to monitor  NUTRITION DIAGNOSIS:   Increased nutrient needs related to chronic illness, wound healing (ETOH cirrhosis) as evidenced by estimated needs.   -N/A  NEW NUTRITION DIAGNOSIS:   Malnutrition (Severe) related to chronic illness, wound healing (ETOH cirrhosis) as evidenced by severe depletion of body fat, severe depletion of muscle mass.  GOAL:   Patient will meet greater than or equal to 90% of their needs  Progressing.  MONITOR:   PO intake, Supplement acceptance, Labs, Weight trends, I & O's  ASSESSMENT:   56 y.o. female with past medical history of alcoholic hepatitis with ascites, alcoholism, dysphagia, anemia and others listed below who presented to the ED on 01/30/17 with a complaint of abdominal pain and "butt pain".  Patient in room with no family at bedside. Pt reports feeling better after paracentesis. Pt states she has been very hungry d/t being on prednisone. She is interested in information on low sodium diet, provided handout. Reviewed basic concepts of low sodium diet. Also, patient severely malnourished and on a heart healthy diet. Will change diet to [facility] diet with fluid restriction.  PO intake: 100%. Pt is trying to drink her Ensures.   Nutrition-Focused physical exam completed. Findings are severe fat depletion, severe muscle depletion, and severe edema. Patient now meets criteria for severe malnutrition in the context of chronic illness.  Medications: Colace capsule BID, Folic acid tablet  daily, Lasix tablet daily, Multivitamin with minerals daily, Protonix tablet BID, Thiamine tablet daily Labs reviewed: Low Na  Diet Order:  Diet Heart Room service appropriate? Yes; Fluid consistency: Thin; Fluid restriction: 1500 mL Fluid  Skin:  Wound (see comment) (5/30 head laceration)  Last BM:  6/11  Height:   Ht Readings from Last 1 Encounters:  01/30/17 5\' 6"  (1.676 m)    Weight:   Wt Readings from Last 1 Encounters:  01/30/17 114 lb (51.7 kg)    Ideal Body Weight:  59.1 kg  BMI:  Body mass index is 18.4 kg/m.  Estimated Nutritional Needs:   Kcal:  1550-1750  Protein:  75-85g  Fluid:  1.8L/day  EDUCATION NEEDS:   Education needs addressed (Low sodium diet education)  Clayton Bibles, MS, RD, LDN Pager: [phone] After Hours Pager: [phone]

## 2017-02-01 NOTE — Progress Notes (Signed)
PROGRESS NOTE    Ashaunti Treptow  GYK:[id number] DOB: [birth year] DOA: 01/30/2017 PCP: [place], Betty G, MD    Brief Narrative: Mercedes Mitchell is a 56 y.o. female with medical history significant of alcoholic cirrhosis with ascites, anemia, and malnutrition presenting with abdominal pain. As per the patient, her abd pain and discomfort has worsened the last few days, after being discharged from the hospital. She underwent both diagnostic and therapeutic paracentesis on 6/11.  Assessment & Plan:   Active Problems:   Acute alcoholic hepatitis   Protein-calorie malnutrition, severe   Alcoholic cirrhosis of liver with ascites (HCC)   Macrocytic anemia   Sepsis (HCC)  Abdominal pain and worsening abd distention: secondary to recurrent ascites and possible sbp, which is from Acute alcoholic hepatitis with liver cirrhosis:  Admitted for IV antibiotics, IV pain control and paracentesis .  She underwent both diag and therapeutic paracentesis, about 5.5 lit of fluid taken. Her neutrophil count is 90 which is not consistent with SBP,. Her gram stain is negative. And cultures have been negative so far.  GI consulted on admission, have stopped the antibiotics.  Scheduled for liver biopsy tomorrow with IR.  Diuresis with lasix and spironolactone and nutrition consult.  Liver function tests are improving.    Severe protein calorie malnutrition:  Dietary consult.     Macrocytic anemia:  Baseline hemoglobin around 11.  Continue to monitor.    Leukocytosis: possibly reactive from prednisone.  Monitor. cxr no infiltrate, UA is negative. Paracentesis,does not indicate SBP. Afebrile.  Currently no signs of infection.  Its improving.    H/o Severe Candida esophagitis on EGD in April 2018: resume diflucan.     Pressure ulcer:   wound care consult for recommendations.   Crying, appears depressed, no suicidal ideations Will get psychiatry involved for medications.      DVT  prophylaxis: SCD'S Code Status: FULL CODE.  Family Communication: none at bedside.  Disposition Plan:  Home in the next 1 to2 days after PT evaluates her.    Consultants:  GI psychiatry Wound care   Procedures: THERAPEUTIC AND DIAG PARACENTESIS.    Antimicrobials: one dose of rocephin.    Subjective: Started crying when I asked her how is feeling.  She reported being upset about not having a air mattress.  No other complaints.   Objective: Vitals:   01/31/17 2000 02/01/17 0427 02/01/17 0900 02/01/17 1400  BP: 92/69 98/72 90/72  127/82  Pulse: 77 83 87 87  Resp: 18 18 18 18   Temp: 97.9 F (36.6 C) 98.3 F (36.8 C) 97.8 F (36.6 C) 97.9 F (36.6 C)  TempSrc: Oral Oral Oral Oral  SpO2: 97% 98% 98% 98%  Weight:      Height:        Intake/Output Summary (Last 24 hours) at 02/01/17 1632 Last data filed at 02/01/17 1354  Gross per 24 hour  Intake             1199 ml  Output                0 ml  Net             1199 ml   Filed Weights   01/30/17 1925  Weight: 51.7 kg (114 lb)    Examination:  General exam: Appears depressed,  Respiratory system: air entry fair, no wheezing or rhonchi.  Cardiovascular system: S1 & S2 heard, RRR. Gastrointestinal system: Abdomen is mildly distended, better than yesterday, soft, non tender bowel sounds  heard Central nervous system: Alert and oriented. No focal neurological deficits. Able to movel all extremities.  Extremities: 2+ lower extremity edema.  Psychiatry: depressed, and crying.     Data Reviewed: I have personally reviewed following labs and imaging studies  CBC:  Recent Labs Lab 01/30/17 1957 01/31/17 0630 02/01/17 0845  WBC 41.6* 35.1* 29.5*  NEUTROABS  --   --  27.1*  HGB 11.8* 9.7* 9.1*  HCT 32.8* 27.3* 25.2*  MCV 102.5* 102.6* 103.7*  PLT 226 187 644   Basic Metabolic Panel:  Recent Labs Lab 01/30/17 1957 01/31/17 0630 02/01/17 0845  NA 129* 131* 131*  K 5.2* 4.5 4.0  CL 99* 100* 100*  CO2  21* 23 23  GLUCOSE 129* 99 123*  BUN 22* 23* 22*  CREATININE 0.52 0.50 0.44  CALCIUM 8.1* 8.1* 8.0*   GFR: Estimated Creatinine Clearance: 65.6 mL/min (by C-G formula based on SCr of 0.44 mg/dL). Liver Function Tests:  Recent Labs Lab 01/30/17 1957 01/31/17 0630 02/01/17 0845  AST 115* 80* 57*  ALT 108* 82* 62*  ALKPHOS 646* 498* 441*  BILITOT 14.0* 11.3* 10.3*  PROT 5.3* 5.0* 4.6*  ALBUMIN 2.1* 2.4* 2.5*    Recent Labs Lab 01/30/17 1957  LIPASE 37    Recent Labs Lab 01/30/17 2140  AMMONIA 102*   Coagulation Profile:  Recent Labs Lab 01/31/17 0216  INR 1.54   Cardiac Enzymes: No results for input(s): CKTOTAL, CKMB, CKMBINDEX, TROPONINI in the last 168 hours. BNP (last 3 results) No results for input(s): PROBNP in the last 8760 hours. HbA1C: No results for input(s): HGBA1C in the last 72 hours. CBG: No results for input(s): GLUCAP in the last 168 hours. Lipid Profile: No results for input(s): CHOL, HDL, LDLCALC, TRIG, CHOLHDL, LDLDIRECT in the last 72 hours. Thyroid Function Tests: No results for input(s): TSH, T4TOTAL, FREET4, T3FREE, THYROIDAB in the last 72 hours. Anemia Panel: No results for input(s): VITAMINB12, FOLATE, FERRITIN, TIBC, IRON, RETICCTPCT in the last 72 hours. Sepsis Labs:  Recent Labs Lab 01/30/17 2117 01/31/17 0216 01/31/17 0630  PROCALCITON  --  1.22  --   LATICACIDVEN 2.32* 2.3* 1.8    Recent Results (from the past 240 hour(s))  Blood culture (routine x 2)     Status: None (Preliminary result)   Collection Time: 01/30/17  9:40 PM  Result Value Ref Range Status   Specimen Description BLOOD BLOOD LEFT FOREARM  Final   Special Requests   Final    BOTTLES DRAWN AEROBIC AND ANAEROBIC Blood Culture adequate volume   Culture   Final    NO GROWTH 1 DAY Performed at [facility], [place] [address] [place], [place] [address]    Report Status PENDING  Incomplete  Blood culture (routine x 2)     Status: None (Preliminary  result)   Collection Time: 01/30/17  9:45 PM  Result Value Ref Range Status   Specimen Description BLOOD BLOOD RIGHT FOREARM  Final   Special Requests   Final    BOTTLES DRAWN AEROBIC ONLY Blood Culture adequate volume   Culture   Final    NO GROWTH 1 DAY Performed at [facility], [place] [address], [place], [place] [address]    Report Status PENDING  Incomplete  Surgical PCR screen     Status: None   Collection Time: 01/31/17  2:08 AM  Result Value Ref Range Status   MRSA, PCR NEGATIVE NEGATIVE Final   Staphylococcus aureus NEGATIVE NEGATIVE Final  Comment:        The Xpert SA Assay (FDA approved for NASAL specimens in patients over 42 years of age), is one component of a comprehensive surveillance program.  Test performance has been validated by [facility] for patients greater than or equal to 40 year old. It is not intended to diagnose infection nor to guide or monitor treatment.   Culture, body fluid-bottle     Status: None (Preliminary result)   Collection Time: 01/31/17  2:18 PM  Result Value Ref Range Status   Specimen Description FLUID ASCITIC  Final   Special Requests BOTTLES DRAWN AEROBIC AND ANAEROBIC  Final   Culture NO GROWTH < 24 HOURS  Final   Report Status PENDING  Incomplete  Gram stain     Status: None   Collection Time: 01/31/17  2:18 PM  Result Value Ref Range Status   Specimen Description FLUID ASCITIC  Final   Special Requests NONE  Final   Gram Stain   Final    WBC PRESENT, PREDOMINANTLY PMN NO ORGANISMS SEEN CYTOSPIN SMEAR    Report Status 02/01/2017 FINAL  Final         Radiology Studies: US Paracentesis  Result Date: 01/31/2017 INDICATION: Alcoholic hepatitis, portal hypertension, recurrent ascites. Request made for diagnostic and therapeutic paracentesis. EXAM: ULTRASOUND GUIDED DIAGNOSTIC AND THERAPEUTIC PARACENTESIS MEDICATIONS: None. COMPLICATIONS: None immediate. PROCEDURE: Informed written consent was obtained from the  patient after a discussion of the risks, benefits and alternatives to treatment. A timeout was performed prior to the initiation of the procedure. Initial ultrasound scanning demonstrates a large amount of ascites within the right lower abdominal quadrant. The right lower abdomen was prepped and draped in the usual sterile fashion. 1% lidocaine was used for local anesthesia. Following this, a Yueh catheter was introduced. An ultrasound image was saved for documentation purposes. The paracentesis was performed. The catheter was removed and a dressing was applied. The patient tolerated the procedure well without immediate post procedural complication. FINDINGS: A total of approximately 5.5 liters of yellow fluid was removed. Samples were sent to the laboratory as requested by the clinical team. IMPRESSION: Successful ultrasound-guided diagnostic and therapeutic paracentesis yielding 5.5 liters of peritoneal fluid. Read by: Rowe Robert, PA-C Electronically Signed   By: Corrie Mckusick D.O.   On: 01/31/2017 14:47   Dg Chest Port 1 View  Result Date: 01/31/2017 CLINICAL DATA:  Sepsis EXAM: PORTABLE CHEST 1 VIEW COMPARISON:  None. FINDINGS: No focal consolidation or pleural effusion. Normal heart size. No pneumothorax. Circular opacity over the right neck is likely an artifact. IMPRESSION: Low lung volumes.  No acute infiltrate or edema. Electronically Signed   By: Donavan Foil M.D.   On: 01/31/2017 02:02        Scheduled Meds: . docusate sodium  100 mg Oral BID  . feeding supplement (ENSURE ENLIVE)  237 mL Oral TID BM  . feeding supplement (PRO-STAT SUGAR FREE 64)  30 mL Oral BID  . fluconazole  200 mg Oral Daily  . folic acid  1 mg Oral Daily  . furosemide  20 mg Oral Daily  . Gerhardt's butt cream   Topical QID  . lactulose  10 g Oral Daily  . multivitamin with minerals  1 tablet Oral Daily  . pantoprazole  40 mg Oral BID  . prednisoLONE  40 mg Oral QAC breakfast  . psyllium  1 packet Oral Daily    . spironolactone  100 mg Oral Daily  . thiamine  100  mg Oral Daily   Continuous Infusions:    LOS: 1 day    Time spent: 40 minutes. 50%  Face to face  with talking to the patient    Hosie Poisson, MD [facility] Pager [phone]  If 7PM-7AM, please contact night-coverage [email or web address] Password [facility] 02/01/2017, 4:32 PM

## 2017-02-01 NOTE — Progress Notes (Signed)
Referring Physician(s): Drazek,D/Gessner,C  Supervising Physician: Sandi Mariscal  Patient Status:  [facility] - In-pt  Chief Complaint: Alcoholic hepatitis, elevated liver function tests, recurrent ascites   Subjective: Patient familiar to IR service from multiple paracenteses, latest on 01/31/17 yielding 5.5 L.  Ascitic fluid WBCs only 24, therefore not consistent with SBP. She was originally scheduled for outpatient transjugular liver biopsy with pressure measurements on 6/11, however she was admitted on 6/10 with abdominal pain and concern for SBP which has since been ruled out. Current pertinent labs include sodium 131, creatinine 0.44, total bilirubin 10.3, AST 57, ALT 62, alkaline phosphatase 441, WBC 29.5 down from 35.1, hemoglobin 9.1, platelets 161k; peritoneal fluid cultures pending; today she is afebrile, currently denies headache, chest pain, dyspnea, worsening abdominal pain, nausea, vomiting or bleeding. She does have a sore area on buttocks/sacral ulcer. Request now received to reconsider transjugular liver biopsy. Past Medical History:  Diagnosis Date  . 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  paracentesis 2.6 liters 5/4: no SBP.    Marland Kitchen Alcoholism ([place]) 12/2016  . Dysphagia 2011  . Macrocytic anemia 12/2016  . Malnutrition ([place]) 12/2016  . Panic type anxiety neurosis 2011   Past Surgical History:  Procedure Laterality Date  . ESOPHAGOGASTRODUODENOSCOPY N/A 12/25/2016   Procedure: ESOPHAGOGASTRODUODENOSCOPY (EGD);  Surgeon: Jerene Bears, MD;  Location: [facility];  Service: Endoscopy;  Laterality: N/A;     Allergies: Patient has no known allergies.  Medications: Prior to Admission medications   Medication Sig Start Date End Date Taking? Authorizing Provider  bacitracin ointment Apply 1 application topically 3 (three) times daily. 01/26/17  Yes Waynetta Pean, PA-C  feeding supplement, ENSURE ENLIVE,  (ENSURE ENLIVE) LIQD Take 237 mLs by mouth 3 (three) times daily between meals. 12/28/16  Yes Mikhail, Velta Addison, DO  folic acid (FOLVITE) 1 MG tablet Take 1 tablet (1 mg total) by mouth daily. 12/29/16  Yes Mikhail, Maryann, DO  furosemide (LASIX) 20 MG tablet Take 1 tablet (20 mg total) by mouth daily. 12/29/16  Yes Mikhail, Maryann, DO  lactulose (CHRONULAC) 10 GM/15ML solution Take 15 mLs by mouth daily. 01/25/17  Yes [provider]  Multiple Vitamin (MULTIVITAMIN WITH MINERALS) TABS tablet Take 1 tablet by mouth daily. 12/29/16  Yes Mikhail, Maryann, DO  pantoprazole (PROTONIX) 40 MG tablet Take 1 tablet (40 mg total) by mouth 2 (two) times daily. 12/28/16  Yes Mikhail, Velta Addison, DO  prednisoLONE (PRELONE) 15 MG/5ML SOLN 40mg  daily until 01/21/17 then taper down by 10mg  daily for 4 days. At 10mg  taper by 5mg  daily then 5 mg daily for 3 days 01/14/17  Yes Willia Craze, NP  psyllium (HYDROCIL/METAMUCIL) 95 % PACK Take 1 packet by mouth daily. 12/29/16  Yes Mikhail, Velta Addison, DO  spironolactone (ALDACTONE) 100 MG tablet Take 1 tablet (100 mg total) by mouth daily. 12/29/16  Yes Mikhail, Velta Addison, DO  traZODone (DESYREL) 50 MG tablet Take 1/2 tablet HS to start Increase to 1-2 tablets HS as needed for sleep 01/26/17  Yes Dara Hoyer, PA-C  fluconazole (DIFLUCAN) 200 MG tablet Take 1 tablet (200 mg total) by mouth daily. 12/29/16   Cristal Ford, DO  thiamine 100 MG tablet Take 1 tablet (100 mg total) by mouth daily. Patient not taking: Reported on 01/30/2017 12/29/16   Cristal Ford, DO     Vital Signs: BP 90/72 (BP Location: Right Arm)   Pulse 87   Temp 97.8 F (36.6  C) (Oral)   Resp 18   Ht 5\' 6"  (1.676 m)   Wt 114 lb (51.7 kg)   SpO2 98%   BMI 18.40 kg/m   Physical Exam thin cachectic-appearing white female in no acute distress. Scleral icterus noted; chest clear to auscultation bilaterally; heart with regular rate and rhythm. Abdomen distended, few bowel sounds, some mild generalized  tenderness to palpation; lower extremities with 2-3+ edema bilaterally; scattered ecchymoses.  Imaging: US Paracentesis  Result Date: 01/31/2017 INDICATION: Alcoholic hepatitis, portal hypertension, recurrent ascites. Request made for diagnostic and therapeutic paracentesis. EXAM: ULTRASOUND GUIDED DIAGNOSTIC AND THERAPEUTIC PARACENTESIS MEDICATIONS: None. COMPLICATIONS: None immediate. PROCEDURE: Informed written consent was obtained from the patient after a discussion of the risks, benefits and alternatives to treatment. A timeout was performed prior to the initiation of the procedure. Initial ultrasound scanning demonstrates a large amount of ascites within the right lower abdominal quadrant. The right lower abdomen was prepped and draped in the usual sterile fashion. 1% lidocaine was used for local anesthesia. Following this, a Yueh catheter was introduced. An ultrasound image was saved for documentation purposes. The paracentesis was performed. The catheter was removed and a dressing was applied. The patient tolerated the procedure well without immediate post procedural complication. FINDINGS: A total of approximately 5.5 liters of yellow fluid was removed. Samples were sent to the laboratory as requested by the clinical team. IMPRESSION: Successful ultrasound-guided diagnostic and therapeutic paracentesis yielding 5.5 liters of peritoneal fluid. Read by: Rowe Robert, PA-C Electronically Signed   By: Corrie Mckusick D.O.   On: 01/31/2017 14:47   Dg Chest Port 1 View  Result Date: 01/31/2017 CLINICAL DATA:  Sepsis EXAM: PORTABLE CHEST 1 VIEW COMPARISON:  None. FINDINGS: No focal consolidation or pleural effusion. Normal heart size. No pneumothorax. Circular opacity over the right neck is likely an artifact. IMPRESSION: Low lung volumes.  No acute infiltrate or edema. Electronically Signed   By: Donavan Foil M.D.   On: 01/31/2017 02:02    Labs:  CBC:  Recent Labs  01/18/17 1134 01/30/17 1957  01/31/17 0630 02/01/17 0845  WBC 37.5 Repeated and verified X2.* 41.6* 35.1* 29.5*  HGB 13.4 11.8* 9.7* 9.1*  HCT 40.1 32.8* 27.3* 25.2*  PLT 336.0 226 187 161    COAGS:  Recent Labs  12/25/16 0654 01/10/17 1110 01/18/17 1134 01/31/17 0216  INR 1.56 1.4* 1.6* 1.54  APTT  --   --   --  39*    BMP:  Recent Labs  12/28/16 0650  01/18/17 1134 01/30/17 1957 01/31/17 0630 02/01/17 0845  NA 129*  < > 131* 129* 131* 131*  K 4.4  < > 4.4 5.2* 4.5 4.0  CL 96*  < > 99 99* 100* 100*  CO2 26  < > 22 21* 23 23  GLUCOSE 87  < > 79 129* 99 123*  BUN 13  < > 17 22* 23* 22*  CALCIUM 7.7*  < > 8.6 8.1* 8.1* 8.0*  CREATININE 0.67  < > 0.66 0.52 0.50 0.44  GFRNONAA >60  --   --  >60 >60 >60  GFRAA >60  --   --  >60 >60 >60  < > = values in this interval not displayed.  LIVER FUNCTION TESTS:  Recent Labs  01/18/17 1134 01/30/17 1957 01/31/17 0630 02/01/17 0845  BILITOT 9.7* 14.0* 11.3* 10.3*  AST 79* 115* 80* 57*  ALT 106* 108* 82* 62*  ALKPHOS 405* 646* 498* 441*  PROT 5.7* 5.3* 5.0* 4.6*  ALBUMIN 2.9* 2.1* 2.4* 2.5*    Assessment and Plan: Patient with decompensated alcoholic hepatitis, elevated LFTs, jaundice, portal hypertension, prior esophagitis, recurrent ascites, leukocytosis(on prednisolone),PCM; request received for transjugular liver biopsy with portal pressure measurements. Latest ascitic fluid analysis not indicative of SBP;current pertinent labs include sodium 131, creatinine 0.44, total bilirubin 10.3, AST 57, ALT 62, alkaline phosphatase 441, WBC 29.5 down from 35.1, hemoglobin 9.1, platelets 161k; peritoneal fluid cultures pending; details/risks of transjugular liver biopsy, including but not limited to, internal bleeding, infection, injury to adjacent structures, discussed with patient with her understanding and consent. Procedure tentatively planned for tomorrow afternoon.   Electronically Signed: D. Rowe Robert, PA-C 02/01/2017, 12:44 PM   I spent a  total of 30 minutes at the the patient's bedside AND on the patient's hospital floor or unit, greater than 50% of which was counseling/coordinating care for transjugular liver biopsy    Patient ID: Mercedes Mitchell, female   DOB: Dec 25, 1960, 56 y.o.   MRN: [id number]

## 2017-02-02 ENCOUNTER — Inpatient Hospital Stay (HOSPITAL_COMMUNITY): Payer: Managed Care, Other (non HMO)

## 2017-02-02 ENCOUNTER — Other Ambulatory Visit (HOSPITAL_COMMUNITY): Payer: 59

## 2017-02-02 ENCOUNTER — Telehealth (HOSPITAL_COMMUNITY): Payer: Self-pay | Admitting: Psychology

## 2017-02-02 ENCOUNTER — Encounter (HOSPITAL_COMMUNITY): Payer: Self-pay | Admitting: Psychology

## 2017-02-02 ENCOUNTER — Encounter (HOSPITAL_COMMUNITY): Payer: Self-pay | Admitting: Interventional Radiology

## 2017-02-02 DIAGNOSIS — F4322 Adjustment disorder with anxiety: Secondary | ICD-10-CM

## 2017-02-02 DIAGNOSIS — K7011 Alcoholic hepatitis with ascites: Principal | ICD-10-CM

## 2017-02-02 DIAGNOSIS — E43 Unspecified severe protein-calorie malnutrition: Secondary | ICD-10-CM

## 2017-02-02 DIAGNOSIS — F17210 Nicotine dependence, cigarettes, uncomplicated: Secondary | ICD-10-CM

## 2017-02-02 DIAGNOSIS — R17 Unspecified jaundice: Secondary | ICD-10-CM

## 2017-02-02 DIAGNOSIS — R1084 Generalized abdominal pain: Secondary | ICD-10-CM

## 2017-02-02 DIAGNOSIS — Z811 Family history of alcohol abuse and dependence: Secondary | ICD-10-CM

## 2017-02-02 DIAGNOSIS — K7290 Hepatic failure, unspecified without coma: Secondary | ICD-10-CM

## 2017-02-02 DIAGNOSIS — K7010 Alcoholic hepatitis without ascites: Secondary | ICD-10-CM

## 2017-02-02 DIAGNOSIS — D539 Nutritional anemia, unspecified: Secondary | ICD-10-CM

## 2017-02-02 HISTORY — PX: IR PARACENTESIS: IMG2679

## 2017-02-02 HISTORY — PX: IR US GUIDE VASC ACCESS RIGHT: IMG2390

## 2017-02-02 HISTORY — PX: IR TRANSCATHETER BX: IMG713

## 2017-02-02 HISTORY — PX: IR VENOGRAM HEPATIC W HEMODYNAMIC EVALUATION: IMG692

## 2017-02-02 LAB — COMPREHENSIVE METABOLIC PANEL
ALBUMIN: 2.6 g/dL — AB (ref 3.5–5.0)
ALK PHOS: 525 U/L — AB (ref 38–126)
ALT: 69 U/L — AB (ref 14–54)
AST: 72 U/L — AB (ref 15–41)
Anion gap: 8 (ref 5–15)
BILIRUBIN TOTAL: 9.5 mg/dL — AB (ref 0.3–1.2)
BUN: 25 mg/dL — AB (ref 6–20)
CALCIUM: 8.6 mg/dL — AB (ref 8.9–10.3)
CO2: 25 mmol/L (ref 22–32)
Chloride: 101 mmol/L (ref 101–111)
Creatinine, Ser: 0.44 mg/dL (ref 0.44–1.00)
GFR calc Af Amer: >60 mL/min (ref >60)
GLUCOSE: 88 mg/dL (ref 65–99)
POTASSIUM: 4.4 mmol/L (ref 3.5–5.1)
Sodium: 134 mmol/L — ABNORMAL LOW (ref 135–145)
TOTAL PROTEIN: 4.9 g/dL — AB (ref 6.5–8.1)

## 2017-02-02 LAB — CBC WITH DIFFERENTIAL/PLATELET
Basophils Absolute: 0 10*3/uL (ref 0.0–0.1)
Basophils Relative: 0 %
Eosinophils Absolute: 0 10*3/uL (ref 0.0–0.7)
Eosinophils Relative: 0 %
HCT: 29.5 % — ABNORMAL LOW (ref 36.0–46.0)
HEMOGLOBIN: 10.3 g/dL — AB (ref 12.0–15.0)
LYMPHS ABS: 1.4 10*3/uL (ref 0.7–4.0)
LYMPHS PCT: 4 %
MCH: 36.1 pg — AB (ref 26.0–34.0)
MCHC: 34.9 g/dL (ref 30.0–36.0)
MCV: 103.5 fL — AB (ref 78.0–100.0)
MONOS PCT: 6 %
Monocytes Absolute: 2.1 10*3/uL — ABNORMAL HIGH (ref 0.1–1.0)
NEUTROS PCT: 90 %
Neutro Abs: 32.8 10*3/uL — ABNORMAL HIGH (ref 1.7–7.7)
Platelets: 184 10*3/uL (ref 150–400)
RBC: 2.85 MIL/uL — AB (ref 3.87–5.11)
RDW: 15.3 % (ref 11.5–15.5)
WBC: 36.3 10*3/uL — AB (ref 4.0–10.5)

## 2017-02-02 LAB — PROTIME-INR
INR: 1.5
Prothrombin Time: 18.3 seconds — ABNORMAL HIGH (ref 11.4–15.2)

## 2017-02-02 MED ORDER — MIDAZOLAM HCL 2 MG/2ML IJ SOLN
INTRAMUSCULAR | Status: AC
  Filled 2017-02-02: qty 6

## 2017-02-02 MED ORDER — LIDOCAINE HCL 1 % IJ SOLN
INTRAMUSCULAR | Status: AC
  Filled 2017-02-02: qty 20

## 2017-02-02 MED ORDER — FUROSEMIDE 40 MG PO TABS
40.0000 mg | ORAL_TABLET | Freq: Every day | ORAL | Status: DC
  Administered 2017-02-03: 40 mg via ORAL
  Filled 2017-02-02: qty 1

## 2017-02-02 MED ORDER — IOPAMIDOL (ISOVUE-300) INJECTION 61%
INTRAVENOUS | Status: AC
Start: 2017-02-02 — End: 2017-02-02
  Administered 2017-02-02: 20 mL via INTRAVENOUS
  Filled 2017-02-02: qty 50

## 2017-02-02 MED ORDER — LIP MEDEX EX OINT: TOPICAL_OINTMENT | CUTANEOUS | Status: DC | PRN

## 2017-02-02 MED ORDER — FENTANYL CITRATE (PF) 100 MCG/2ML IJ SOLN
INTRAMUSCULAR | Status: AC
  Filled 2017-02-02: qty 4

## 2017-02-02 MED ORDER — LIDOCAINE HCL 1 % IJ SOLN
INTRAMUSCULAR | Status: AC | PRN
  Administered 2017-02-02: 10 mL

## 2017-02-02 MED ORDER — MIDAZOLAM HCL 2 MG/2ML IJ SOLN
INTRAMUSCULAR | Status: AC | PRN
  Administered 2017-02-02 (×3): 1 mg via INTRAVENOUS

## 2017-02-02 MED ORDER — ALBUMIN HUMAN 25 % IV SOLN
25.0000 g | Freq: Once | INTRAVENOUS | Status: AC
  Administered 2017-02-02: 25 g via INTRAVENOUS
  Filled 2017-02-02: qty 100

## 2017-02-02 MED ORDER — FENTANYL CITRATE (PF) 100 MCG/2ML IJ SOLN
INTRAMUSCULAR | Status: AC | PRN
  Administered 2017-02-02: 50 ug via INTRAVENOUS
  Administered 2017-02-02: 25 ug via INTRAVENOUS

## 2017-02-02 MED ORDER — IOPAMIDOL (ISOVUE-300) INJECTION 61%
50.0000 mL | Freq: Once | INTRAVENOUS | Status: AC | PRN
  Administered 2017-02-02: 20 mL via INTRAVENOUS

## 2017-02-02 NOTE — Procedures (Signed)
Pre procedural Dx: Cirrhosis  Post procedural Dx: Same  Technically successful US guided paracentesis yielding 2.4 L of serous fluid. Technically successful fluoro guided biopsy transjugular hepatic biopsy with acquisition of pressure measurements.    EBL: None.   Complications: None immediate.   Ronny Bacon, MD Pager #: [phone]

## 2017-02-02 NOTE — Progress Notes (Signed)
PT Cancellation Note  Patient Details Name: Mercedes Mitchell MRN: [id number] DOB: [birth year]   Cancelled Treatment:    Reason Eval/Treat Not Completed: Patient at procedure or test/unavailable (Liver bx, attempt next day or  as schedule permits)   [facility] 02/02/2017, 11:51 AM

## 2017-02-02 NOTE — Discharge Instructions (Signed)
Liver Biopsy, Care After °Refer to this sheet in the next few weeks. These instructions provide you with information on caring for yourself after your procedure. Your health care provider may also give you more specific instructions. Your treatment has been planned according to current medical practices, but problems sometimes occur. Call your health care provider if you have any problems or questions after your procedure. °What can I expect after the procedure? °After your procedure, it is typical to have the following: °· A small amount of discomfort in the area where the biopsy was done and in the right shoulder or shoulder blade. °· A small amount of bruising around the area where the biopsy was done and on the skin over the liver. °· Sleepiness and fatigue for the rest of the day. ° °Follow these instructions at home: °· Rest at home for 1-2 days or as directed by your health care provider. °· Have a friend or family member stay with you for at least 24 hours. °· Because of the medicines used during the procedure, you should not do the following things in the first 24 hours: °? Drive. °? Use machinery. °? Be responsible for the care of other people. °? Sign legal documents. °? Take a bath or shower. °· There are many different ways to close and cover an incision, including stitches, skin glue, and adhesive strips. Follow your health care provider's instructions on: °? Incision care. °? Bandage (dressing) changes and removal. °? Incision closure removal. °· Do not drink alcohol in the first week. °· Do not lift more than 5 pounds or play contact sports for 2 weeks after this test. °· Take medicines only as directed by your health care provider. Do not take medicine containing aspirin or non-steroidal anti-inflammatory medicines such as ibuprofen for 1 week after this test. °· It is your responsibility to get your test results. °Contact a health care provider if: °· You have increased bleeding from an incision  that results in more than a small spot of blood. °· You have redness, swelling, or increasing pain in any incisions. °· You notice a discharge or a bad smell coming from any of your incisions. °· You have a fever or chills. °Get help right away if: °· You develop swelling, bloating, or pain in your abdomen. °· You become dizzy or faint. °· You develop a rash. °· You are nauseous or vomit. °· You have difficulty breathing, feel short of breath, or feel faint. °· You develop chest pain. °· You have problems with your speech or vision. °· You have trouble balancing or moving your arms or legs. °This information is not intended to replace advice given to you by your health care provider. Make sure you discuss any questions you have with your health care provider. °Document Released: 02/26/2005 Document Revised: 01/15/2016 Document Reviewed: 10/05/2013 °Elsevier Interactive Patient Education © 2018 [employer] °Liver Biopsy, Care After °These instructions give you information on caring for yourself after your procedure. Your doctor may also give you more specific instructions. Call your doctor if you have any problems or questions after your procedure. °Follow these instructions at home: °· Rest at home for 1-2 days or as told by your doctor. °· Have someone stay with you for at least 24 hours. °· Do not do these things in the first 24 hours: °? Drive. °? Use machinery. °? Take care of other people. °? Sign legal documents. °? Take a bath or shower. °· There are many different   ways to close and cover a cut (incision). For example, a cut can be closed with stitches, skin glue, or adhesive strips. Follow your doctor's instructions on: °? Taking care of your cut. °? Changing and removing your bandage (dressing). °? Removing whatever was used to close your cut. °· Do not drink alcohol in the first week. °· Do not lift more than 5 pounds or play contact sports for the first 2 weeks. °· Take medicines only as told by your  doctor. For 1 week, do not take medicine that has aspirin in it or medicines like ibuprofen. °· Get your test results. °Contact a doctor if: °· A cut bleeds and leaves more than just a small spot of blood. °· A cut is red, puffs up (swells), or hurts more than before. °· Fluid or something else comes from a cut. °· A cut smells bad. °· You have a fever or chills. °Get help right away if: °· You have swelling, bloating, or pain in your belly (abdomen). °· You get dizzy or faint. °· You have a rash. °· You feel sick to your stomach (nauseous) or throw up (vomit). °· You have trouble breathing, feel short of breath, or feel faint. °· Your chest hurts. °· You have problems talking or seeing. °· You have trouble balancing or moving your arms or legs. °This information is not intended to replace advice given to you by your health care provider. Make sure you discuss any questions you have with your health care provider. °Document Released: 05/18/2008 Document Revised: 01/15/2016 Document Reviewed: 10/05/2013 °Elsevier Interactive Patient Education © 2018 [employer] °Moderate Conscious Sedation, Adult, Care After °These instructions provide you with information about caring for yourself after your procedure. Your health care provider may also give you more specific instructions. Your treatment has been planned according to current medical practices, but problems sometimes occur. Call your health care provider if you have any problems or questions after your procedure. °What can I expect after the procedure? °After your procedure, it is common: °· To feel sleepy for several hours. °· To feel clumsy and have poor balance for several hours. °· To have poor judgment for several hours. °· To vomit if you eat too soon. ° °Follow these instructions at home: °For at least 24 hours after the procedure: ° °· Do not: °? Participate in activities where you could fall or become injured. °? Drive. °? Use heavy machinery. °? Drink  alcohol. °? Take sleeping pills or medicines that cause drowsiness. °? Make important decisions or sign legal documents. °? Take care of children on your own. °· Rest. °Eating and drinking °· Follow the diet recommended by your health care provider. °· If you vomit: °? Drink water, juice, or soup when you can drink without vomiting. °? Make sure you have little or no nausea before eating solid foods. °General instructions °· Have a responsible adult stay with you until you are awake and alert. °· Take over-the-counter and prescription medicines only as told by your health care provider. °· If you smoke, do not smoke without supervision. °· Keep all follow-up visits as told by your health care provider. This is important. °Contact a health care provider if: °· You keep feeling nauseous or you keep vomiting. °· You feel light-headed. °· You develop a rash. °· You have a fever. °Get help right away if: °· You have trouble breathing. °This information is not intended to replace advice given to you by your health care provider.   Make sure you discuss any questions you have with your health care provider. °Document Released: 05/30/2013 Document Revised: 01/12/2016 Document Reviewed: 11/29/2015 °Elsevier Interactive Patient Education © 2018 Elsevier Inc. ° °

## 2017-02-02 NOTE — Consult Note (Signed)
[facility] Consult   Reason for Consult:  depression Referring Physician:  Dr. Karleen Hampshire Patient Identification: Mercedes Mitchell MRN:  [id number] Principal Diagnosis: <principal problem not specified> Diagnosis:   Patient Active Problem List   Diagnosis Date Noted  . Alcoholic cirrhosis of liver with ascites ([place]) [K70.31] 01/12/2017  . GI bleeding [K92.2] 01/12/2017  . Current every day smoker [F17.200] 01/12/2017  . Ascites [R18.8]   . Coffee ground emesis [K92.0]   . Acute esophagitis [K20.9]   . Protein-calorie malnutrition, severe [E43] 12/24/2016  . Acute alcoholic hepatitis [B26.20] 12/23/2016  . ETOH abuse [F10.10] 12/23/2016  . Macrocytic anemia [D53.9] 12/21/2016  . 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 DYSPHAGIA [R13.10] 03/25/2010    Total Time spent with patient: 1 hour  Subjective:   Mercedes Mitchell is a 56 y.o. female patient admitted with depression and abdominal pain.Marland Kitchen  HPI:  Mercedes Mitchell a 56 y.o.female, seen, chart reviewed and case discussed with CSW for this face-to-face psychiatric consultation and evaluation. Patient reported she has been suffering with alcoholic hepatitis and cirrhosis came to the hospital with abdominal pain. Reportedly patient stopped drinking 40 days ago and also endorses recent fall about a week ago while going to the [employer] in the [employer], which required stitches in the emergency department for head. Patient reported she has been worried and stressed about liver biopsy which she completed successfully this morning. Patient denies current symptoms of depression, anxiety, psychosis and denied active suicidal/homicidal ideation, intention or plans.  Medical history: Patientwith medical history significant of alcoholic cirrhosis with ascites, anemia, and malnutrition presenting with abdominal pain. She reports that she has a "thing" on her butt - like an abscess, extraordinarily painful  (stage 2 pressure ulcer);not sleeping, "I've got all this fluid in my body." Hard toget up and down due to ascites. Swollen lymph nodes in neck "recently." Fluid in abdomen is worsening. She wentto an AA meeting last week and tripped and hit her head about 1 week ago, requiring sutures (recently removed). 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    Past Psychiatric History: Alcohol hepatitis and alcohol dependence  Risk to Self: Is patient at risk for suicide?: No Risk to Others:   Prior Inpatient Therapy:   Prior Outpatient Therapy:    Past Medical History:  Past Medical History:  Diagnosis Date  . 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  paracentesis 2.6 liters 5/4: no SBP.    Marland Kitchen Alcoholism ([place]) 12/2016  . Dysphagia 2011  . Macrocytic anemia 12/2016  . Malnutrition ([place]) 12/2016  . Panic type anxiety neurosis 2011    Past Surgical History:  Procedure Laterality Date  . ESOPHAGOGASTRODUODENOSCOPY N/A 12/25/2016   Procedure: ESOPHAGOGASTRODUODENOSCOPY (EGD);  Surgeon: Jerene Bears, MD;  Location: [facility];  Service: Endoscopy;  Laterality: N/A;  . IR PARACENTESIS  02/02/2017  . IR TRANSCATHETER BX  02/02/2017  . IR US GUIDE VASC ACCESS RIGHT  02/02/2017  . IR VENOGRAM HEPATIC W HEMODYNAMIC EVALUATION  02/02/2017   Family History:  Family History  Problem Relation Age of Onset  . Hyperlipidemia Father   . Heart disease Father    Family Psychiatric  History: Significant family history of alcoholism. Social History:  History  Alcohol Use  . Yes    Comment: Last drink: 36 days ago      History  Drug Use No    Comment: Last use Cocaine 11/17, reports last  use "over a year", [facility] 2/18, reports "a year" ago    Social History   Social History  . Marital status: Married    Spouse name: N/A  . Number of children: N/A  . Years of education: N/A   Occupational History  . Unemployed    Social History Main Topics  . Smoking  status: Former Smoker    Types: Cigarettes  . Smokeless tobacco: Never Used     Comment: Reports she quit "about 6 months ago"  . Alcohol use Yes     Comment: Last drink: 36 days ago   . Drug use: No     Comment: Last use Cocaine 11/17, reports last use "over a year", [facility] 2/18, reports "a year" ago  . Sexual activity: Not Asked   Other Topics Concern  . None   Social History Narrative   Married, children, not working   Additional Social History:    Allergies:  No Known Allergies  Labs:  Results for orders placed or performed during the hospital encounter of 01/30/17 (from the past 48 hour(s))  CBC with Differential/Platelet     Status: Abnormal   Collection Time: 02/01/17  8:45 AM  Result Value Ref Range   WBC 29.5 (H) 4.0 - 10.5 K/uL    Comment: WHITE COUNT CONFIRMED ON SMEAR   RBC 2.43 (L) 3.87 - 5.11 MIL/uL   Hemoglobin 9.1 (L) 12.0 - 15.0 g/dL   HCT 25.2 (L) 36.0 - 46.0 %   MCV 103.7 (H) 78.0 - 100.0 fL   MCH 37.4 (H) 26.0 - 34.0 pg   MCHC 36.1 (H) 30.0 - 36.0 g/dL   RDW 15.3 11.5 - 15.5 %   Platelets 161 150 - 400 K/uL   Neutrophils Relative % 92 %   Lymphocytes Relative 4 %   Monocytes Relative 4 %   Eosinophils Relative 0 %   Basophils Relative 0 %   Neutro Abs 27.1 (H) 1.7 - 7.7 K/uL   Lymphs Abs 1.2 0.7 - 4.0 K/uL   Monocytes Absolute 1.2 (H) 0.1 - 1.0 K/uL   Eosinophils Absolute 0.0 0.0 - 0.7 K/uL   Basophils Absolute 0.0 0.0 - 0.1 K/uL   RBC Morphology TARGET CELLS    WBC Morphology TOXIC GRANULATION   Comprehensive metabolic panel     Status: Abnormal   Collection Time: 02/01/17  8:45 AM  Result Value Ref Range   Sodium 131 (L) 135 - 145 mmol/L   Potassium 4.0 3.5 - 5.1 mmol/L   Chloride 100 (L) 101 - 111 mmol/L   CO2 23 22 - 32 mmol/L   Glucose, Bld 123 (H) 65 - 99 mg/dL   BUN 22 (H) 6 - 20 mg/dL   Creatinine, Ser 0.44 0.44 - 1.00 mg/dL   Calcium 8.0 (L) 8.9 - 10.3 mg/dL   Total Protein 4.6 (L) 6.5 - 8.1 g/dL   Albumin 2.5 (L) 3.5 - 5.0 g/dL    AST 57 (H) 15 - 41 U/L   ALT 62 (H) 14 - 54 U/L   Alkaline Phosphatase 441 (H) 38 - 126 U/L   Total Bilirubin 10.3 (H) 0.3 - 1.2 mg/dL   GFR calc non Af Amer >60 >60 mL/min   GFR calc Af Amer >60 >60 mL/min    Comment: (NOTE) The eGFR has been calculated using the CKD EPI equation. This calculation has not been validated in all clinical situations. eGFR's persistently <60 mL/min signify possible Chronic Kidney Disease.    Anion gap 8  5 - 15  CBC with Differential/Platelet     Status: Abnormal   Collection Time: 02/02/17  8:15 AM  Result Value Ref Range   WBC 36.3 (H) 4.0 - 10.5 K/uL    Comment: WHITE COUNT CONFIRMED ON SMEAR   RBC 2.85 (L) 3.87 - 5.11 MIL/uL   Hemoglobin 10.3 (L) 12.0 - 15.0 g/dL   HCT 29.5 (L) 36.0 - 46.0 %   MCV 103.5 (H) 78.0 - 100.0 fL   MCH 36.1 (H) 26.0 - 34.0 pg   MCHC 34.9 30.0 - 36.0 g/dL   RDW 15.3 11.5 - 15.5 %   Platelets 184 150 - 400 K/uL   Neutrophils Relative % 90 %   Neutro Abs 32.8 (H) 1.7 - 7.7 K/uL   Lymphocytes Relative 4 %   Lymphs Abs 1.4 0.7 - 4.0 K/uL   Monocytes Relative 6 %   Monocytes Absolute 2.1 (H) 0.1 - 1.0 K/uL   Eosinophils Relative 0 %   Eosinophils Absolute 0.0 0.0 - 0.7 K/uL   Basophils Relative 0 %   Basophils Absolute 0.0 0.0 - 0.1 K/uL  Protime-INR     Status: Abnormal   Collection Time: 02/02/17  8:15 AM  Result Value Ref Range   Prothrombin Time 18.3 (H) 11.4 - 15.2 seconds   INR 1.50   Comprehensive metabolic panel     Status: Abnormal   Collection Time: 02/02/17  8:15 AM  Result Value Ref Range   Sodium 134 (L) 135 - 145 mmol/L   Potassium 4.4 3.5 - 5.1 mmol/L   Chloride 101 101 - 111 mmol/L   CO2 25 22 - 32 mmol/L   Glucose, Bld 88 65 - 99 mg/dL   BUN 25 (H) 6 - 20 mg/dL   Creatinine, Ser 0.44 0.44 - 1.00 mg/dL   Calcium 8.6 (L) 8.9 - 10.3 mg/dL   Total Protein 4.9 (L) 6.5 - 8.1 g/dL   Albumin 2.6 (L) 3.5 - 5.0 g/dL   AST 72 (H) 15 - 41 U/L   ALT 69 (H) 14 - 54 U/L   Alkaline Phosphatase 525 (H) 38 -  126 U/L   Total Bilirubin 9.5 (H) 0.3 - 1.2 mg/dL   GFR calc non Af Amer >60 >60 mL/min   GFR calc Af Amer >60 >60 mL/min    Comment: (NOTE) The eGFR has been calculated using the CKD EPI equation. This calculation has not been validated in all clinical situations. eGFR's persistently <60 mL/min signify possible Chronic Kidney Disease.    Anion gap 8 5 - 15    Current Facility-Administered Medications  Medication Dose Route Frequency Provider Last Rate Last Dose  . acetaminophen (TYLENOL) tablet 650 mg  650 mg Oral Q6H PRN Karmen Bongo, MD   650 mg at 01/31/17 1726   Or  . acetaminophen (TYLENOL) suppository 650 mg  650 mg Rectal Q6H PRN Karmen Bongo, MD      . docusate sodium (COLACE) capsule 100 mg  100 mg Oral BID Karmen Bongo, MD   100 mg at 01/31/17 0230  . feeding supplement (ENSURE ENLIVE) (ENSURE ENLIVE) liquid 237 mL  237 mL Oral TID BM Karmen Bongo, MD   237 mL at 02/02/17 1441  . feeding supplement (PRO-STAT SUGAR FREE 64) liquid 30 mL  30 mL Oral BID Hosie Poisson, MD   30 mL at 02/01/17 2220  . fentaNYL (SUBLIMAZE) 100 MCG/2ML injection           . fluconazole (DIFLUCAN) tablet 200 mg  200 mg Oral Daily Karmen Bongo, MD   200 mg at 02/01/17 1017  . folic acid (FOLVITE) tablet 1 mg  1 mg Oral Daily Karmen Bongo, MD   1 mg at 02/01/17 1017  . [START ON 02/03/2017] furosemide (LASIX) tablet 40 mg  40 mg Oral Daily Gatha Mayer, MD      . Gerhardt's butt cream   Topical QID Hosie Poisson, MD      . lactulose ([place]) 10 GM/15ML solution 10 g  10 g Oral BID Hosie Poisson, MD   10 g at 02/01/17 2219  . lip balm (CARMEX) ointment   Topical PRN Cristal Ford, DO      . midazolam (VERSED) 2 MG/2ML injection           . multivitamin with minerals tablet 1 tablet  1 tablet Oral Daily Karmen Bongo, MD   1 tablet at 02/01/17 1017  . ondansetron (ZOFRAN) tablet 4 mg  4 mg Oral Q6H PRN Karmen Bongo, MD       Or  . ondansetron [facility]) injection 4 mg  4 mg  Intravenous Q6H PRN Karmen Bongo, MD      . pantoprazole (PROTONIX) EC tablet 40 mg  40 mg Oral BID Karmen Bongo, MD   40 mg at 02/01/17 2219  . prednisoLONE (PRELONE) 15 MG/5ML SOLN 40 mg  40 mg Oral QAC breakfast Karmen Bongo, MD   40 mg at 02/01/17 0100  . psyllium (HYDROCIL/METAMUCIL) packet 1 packet  1 packet Oral Daily Karmen Bongo, MD   1 packet at 02/01/17 1016  . spironolactone (ALDACTONE) tablet 100 mg  100 mg Oral Daily Karmen Bongo, MD   100 mg at 02/01/17 1016  . thiamine (VITAMIN B-1) tablet 100 mg  100 mg Oral Daily Karmen Bongo, MD   100 mg at 02/01/17 1017  . traMADol (ULTRAM) tablet 50 mg  50 mg Oral Q6H PRN Gardiner Barefoot, NP   50 mg at 02/01/17 0404  . traZODone (DESYREL) tablet 50 mg  50 mg Oral QHS PRN Karmen Bongo, MD        Musculoskeletal: Strength & Muscle Tone: decreased Gait & Station: unable to stand Patient leans: N/A  Psychiatric Specialty Exam: Physical Exam as per history and physical   ROS generalized weakness, fatigue and tiredness  No Fever-chills, No Headache, No changes with Vision or hearing, reports vertigo No problems swallowing food or Liquids, No Chest pain, Cough or Shortness of Breath, No Abdominal pain, No Nausea or Vommitting, Bowel movements are regular, No Blood in stool or Urine, No dysuria, No new skin rashes or bruises, No new joints pains-aches,  No new weakness, tingling, numbness in any extremity, No recent weight gain or loss, No polyuria, polydypsia or polyphagia,  A full 10 point Review of Systems was done, except as stated above, all other Review of Systems were negative.  Blood pressure 105/80, pulse 99, temperature 98 F (36.7 C), temperature source Oral, resp. rate 12, height _0  (1.676 m), weight 51.7 kg (114 lb), SpO2 99 %.Body mass index is 18.4 kg/m.  General Appearance: Casual  Eye Contact:  Good  Speech:  Clear and Coherent  Volume:  Normal  Mood:  Euthymic  Affect:  Appropriate  and Congruent  Thought Process:  Coherent and Goal Directed  Orientation:  Full (Time, Place, and Person)  Thought Content:  Logical  Suicidal Thoughts:  No  Homicidal Thoughts:  No  Memory:  Immediate;   Good Recent;   Fair  Remote;   Fair  Judgement:  Intact  Insight:  Fair  Psychomotor Activity:  Decreased  Concentration:  Concentration: Good and Attention Span: Fair  Recall:  Good  Fund of Knowledge:  Good  Language:  Good  Akathisia:  Negative  Handed:  Right  AIMS (if indicated):     Assets:  Communication Skills Desire for Improvement Financial Resources/Insurance Housing Intimacy Leisure Time Resilience Social Support Talents/Skills Transportation  ADL's:  Intact  Cognition:  WNL  Sleep:        Treatment Plan Summary: 56 years old female with alcohol hepatitis and cirrhosis along with ascites came to the hospital with abdominal pain and received a liver biopsy this morning. Patient denies current symptoms of depression, anxiety and psychosis.  Patient has no evidence of suicidal/homicidal ideation, intention or plans. Patient is willing to receive outpatient psychiatric medication management if needed for depression or anxiety Patient gently refuses psychotropic medication during this visit Appreciate psychiatric consultation and we sign off as of today Please contact [phone] or [phone] if needs further assistance   Disposition: May provide referral to the outpatient psychiatric services at the time of discharge Supportive therapy provided about ongoing stressors.  Ambrose Finland, MD 02/02/2017 4:01 PM

## 2017-02-02 NOTE — Progress Notes (Signed)
Mercedes Mitchell is a 56 y.o. female patient. CD-IOP: Excused Absence. The patient did not attend the group session today. She remains hospitalized at [facility]. Will wait to hear what the outcome of liver biopsy is and the plans patient and her team have going forward.        Brandon Melnick, LCAS

## 2017-02-02 NOTE — Progress Notes (Addendum)
PROGRESS NOTE    Mercedes Mitchell  KZS:[id number] DOB: 1960/11/05 DOA: 01/30/2017 PCP: Martin[place], Betty G, MD   Chief Complaint  Patient presents with  . Abdominal Pain    Brief Narrative:  HPI on 01/30/2017 by Dr. Karmen Bongo Kelyse Mercedes Mitchell is a 56 y.o. female with medical history significant of alcoholic cirrhosis with ascites, anemia, and malnutrition presenting with abdominal pain.  She reports that she has a "thing" on her butt - like an abscess, extraordinarily painful (stage 2 pressure ulcer); not sleeping, "I've got all this fluid in my body."  Hard to get up and down due to ascites.  Swollen lymph nodes in neck "recently." Fluid in abdomen is worsening.  She went to [place] last week and tripped and hit her head about 1 week ago, requiring sutures (recently removed).  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   Interim history Patient admitted for abdominal pain with worsening distention. Underwent paracentesis with 5.5 L of fluid removed. GI consulted and appreciated. Patient scheduled for a liver biopsy with interventional radiology. Repeat paracentesis ordered for today given reaccumulation of her ascites.  Assessment & Plan   Abdominal pain/worsening abdominal distention/acute alcoholic hepatitis with liver cirrhosis -Gastroneurology consulted and appreciated -Interventional radiology consult and appreciated -Status post US paracentesis with 5.5 L removed on 01/31/2017 -s/p US paracentesis with 2.4L removed on 02/02/2017 and Fluoro guided transjugular hepatic biopsy  -LFTs currently stable -Gastroenterology increasing Lasix to 40 mg, patient may need higher dose of spironolactone  Severe protein calorie malnutrition -Nutrition consulted and appreciated, continue supplements  Macrocytic anemia -Baseline hemoglobin 11 -Hemoglobin currently stable 10.3 -Continue to monitor CBC  Leukocytosis -Likely secondary to prednisone -Chest x-ray and UA unremarkable for  infection -Oncology/hematology consulted and appreciated, recommended treating underlying illness -Fluid labs do not indicate SBP -Patient currently afebrile, with no signs of infection -Continue to monitor CBC  History of severe Candida esophagitis -Noted on EGD done in April 2018 -Continue Diflucan  Pressure ulcer -Wound care consulted and appreciated -on buttock -Continue air mattress  Possible depression -Likely secondary to the above issues -Psychiatry consulted and appreciated  Chronic hyponatremia -Sodium appears to be stable  Sepsis ruled out -Suspected on admission, no source found  DVT Prophylaxis  SCDs  Code Status: Full  Family Communication: None at bedside  Disposition Plan: Admitted. Possible discharge to home. Pending PT consult  Consultants Gastroenterology Oncology/Hematology Interventional radiology Psychiatry   Procedures  US guided paracentesis x2 Fluoroscopy guided transjugular hepatic biopsy  Antibiotics   Anti-infectives    Start     Dose/Rate Route Frequency Ordered Stop   01/31/17 1200  vancomycin (VANCOCIN) IVPB 750 mg/150 ml premix  Status:  Discontinued     750 mg 150 mL/hr over 60 Minutes Intravenous Every 12 hours 01/31/17 0333 01/31/17 1048   01/31/17 1000  fluconazole (DIFLUCAN) tablet 200 mg     200 mg Oral Daily 01/31/17 0130     01/31/17 0600  ceFEPIme (MAXIPIME) 2 g in dextrose 5 % 50 mL IVPB  Status:  Discontinued     2 g 100 mL/hr over 30 Minutes Intravenous Every 8 hours 01/31/17 0210 01/31/17 1047   01/31/17 0145  vancomycin (VANCOCIN) IVPB 1000 mg/200 mL premix     1,000 mg 200 mL/hr over 60 Minutes Intravenous  Once 01/31/17 0130 01/31/17 0330   01/30/17 2230  cefTRIAXone (ROCEPHIN) 1 g in dextrose 5 % 50 mL IVPB     1 g 100 mL/hr over 30 Minutes Intravenous  Once  01/30/17 2225 01/30/17 2348      Subjective:   [name] seen and examined today.  States she is hungry. Feeling better today. Denies current chest  pain, shortness breath, abdominal pain, nausea or vomiting, diarrhea or constipation, dizziness or headache.  Objective:   Vitals:   02/02/17 1246 02/02/17 1256 02/02/17 1301 02/02/17 1305  BP: 100/78 104/71 111/85 108/77  Pulse: 100 96 (!) 101 97  Resp: 15 16 19 12   Temp:      TempSrc:      SpO2: 100% 100% 100% 100%  Weight:      Height:        Intake/Output Summary (Last 24 hours) at 02/02/17 1349 Last data filed at 02/01/17 1354  Gross per 24 hour  Intake              237 ml  Output                0 ml  Net              237 ml   Filed Weights   01/30/17 1925  Weight: 51.7 kg (114 lb)    Exam  General: Well developed, Cachectic, chronically ill-appearing, in no apparent distress  HEENT: NCAT, PERRLA, EOMI, Icteic Sclera, mucous membranes moist. Abrasion forehead  Cardiovascular: S1 S2 auscultated, no rubs, murmurs or gallops. Regular rate and rhythm.  Respiratory: Clear to auscultation bilaterally with equal chest rise  Abdomen: Soft, nontender, +distended, + bowel sounds  Extremities: warm dry without cyanosis clubbing. +LE edema R>L  Neuro: AAOx3, nonfocal  Psych: Flat affect, however appropriate given situation   Data Reviewed: I have personally reviewed following labs and imaging studies  CBC:  Recent Labs Lab 01/30/17 1957 01/31/17 0630 02/01/17 0845 02/02/17 0815  WBC 41.6* 35.1* 29.5* 36.3*  NEUTROABS  --   --  27.1* 32.8*  HGB 11.8* 9.7* 9.1* 10.3*  HCT 32.8* 27.3* 25.2* 29.5*  MCV 102.5* 102.6* 103.7* 103.5*  PLT 226 187 161 259   Basic Metabolic Panel:  Recent Labs Lab 01/30/17 1957 01/31/17 0630 02/01/17 0845 02/02/17 0815  NA 129* 131* 131* 134*  K 5.2* 4.5 4.0 4.4  CL 99* 100* 100* 101  CO2 21* 23 23 25   GLUCOSE 129* 99 123* 88  BUN 22* 23* 22* 25*  CREATININE 0.52 0.50 0.44 0.44  CALCIUM 8.1* 8.1* 8.0* 8.6*   GFR: Estimated Creatinine Clearance: 65.6 mL/min (by C-G formula based on SCr of 0.44 mg/dL). Liver Function  Tests:  Recent Labs Lab 01/30/17 1957 01/31/17 0630 02/01/17 0845 02/02/17 0815  AST 115* 80* 57* 72*  ALT 108* 82* 62* 69*  ALKPHOS 646* 498* 441* 525*  BILITOT 14.0* 11.3* 10.3* 9.5*  PROT 5.3* 5.0* 4.6* 4.9*  ALBUMIN 2.1* 2.4* 2.5* 2.6*    Recent Labs Lab 01/30/17 1957  LIPASE 37    Recent Labs Lab 01/30/17 2140  AMMONIA 102*   Coagulation Profile:  Recent Labs Lab 01/31/17 0216 02/02/17 0815  INR 1.54 1.50   Cardiac Enzymes: No results for input(s): CKTOTAL, CKMB, CKMBINDEX, TROPONINI in the last 168 hours. BNP (last 3 results) No results for input(s): PROBNP in the last 8760 hours. HbA1C: No results for input(s): HGBA1C in the last 72 hours. CBG: No results for input(s): GLUCAP in the last 168 hours. Lipid Profile: No results for input(s): CHOL, HDL, LDLCALC, TRIG, CHOLHDL, LDLDIRECT in the last 72 hours. Thyroid Function Tests: No results for input(s): TSH, T4TOTAL, FREET4, T3FREE, THYROIDAB in  the last 72 hours. Anemia Panel: No results for input(s): VITAMINB12, FOLATE, FERRITIN, TIBC, IRON, RETICCTPCT in the last 72 hours. Urine analysis:    Component Value Date/Time   COLORURINE AMBER (A) 01/31/2017 0214   APPEARANCEUR HAZY (A) 01/31/2017 0214   LABSPEC 1.013 01/31/2017 0214   PHURINE 5.0 01/31/2017 0214   GLUCOSEU NEGATIVE 01/31/2017 0214   HGBUR NEGATIVE 01/31/2017 0214   HGBUR large 04/13/2010 1228   BILIRUBINUR SMALL (A) 01/31/2017 0214   KETONESUR NEGATIVE 01/31/2017 0214   PROTEINUR NEGATIVE 01/31/2017 0214   UROBILINOGEN 0.2 04/13/2010 1228   NITRITE NEGATIVE 01/31/2017 0214   LEUKOCYTESUR NEGATIVE 01/31/2017 0214   Sepsis Labs: @LABRCNTIP (procalcitonin:4,lacticidven:4)  ) Recent Results (from the past 240 hour(s))  Blood culture (routine x 2)     Status: None (Preliminary result)   Collection Time: 01/30/17  9:40 PM  Result Value Ref Range Status   Specimen Description BLOOD BLOOD LEFT FOREARM  Final   Special Requests    Final    BOTTLES DRAWN AEROBIC AND ANAEROBIC Blood Culture adequate volume   Culture   Final    NO GROWTH 2 DAYS Performed at [facility], [place] [address] [place], [place] [address]    Report Status PENDING  Incomplete  Blood culture (routine x 2)     Status: None (Preliminary result)   Collection Time: 01/30/17  9:45 PM  Result Value Ref Range Status   Specimen Description BLOOD BLOOD RIGHT FOREARM  Final   Special Requests   Final    BOTTLES DRAWN AEROBIC ONLY Blood Culture adequate volume   Culture   Final    NO GROWTH 2 DAYS Performed at [facility], [place] [address] [place], [place] [address]    Report Status PENDING  Incomplete  Surgical PCR screen     Status: None   Collection Time: 01/31/17  2:08 AM  Result Value Ref Range Status   MRSA, PCR NEGATIVE NEGATIVE Final   Staphylococcus aureus NEGATIVE NEGATIVE Final    Comment:        The Xpert SA Assay (FDA approved for NASAL specimens in patients over 22 years of age), is one component of a comprehensive surveillance program.  Test performance has been validated by [facility] for patients greater than or equal to 44 year old. It is not intended to diagnose infection nor to guide or monitor treatment.   Culture, body fluid-bottle     Status: None (Preliminary result)   Collection Time: 01/31/17  2:18 PM  Result Value Ref Range Status   Specimen Description FLUID ASCITIC  Final   Special Requests BOTTLES DRAWN AEROBIC AND ANAEROBIC  Final   Culture NO GROWTH 2 DAYS  Final   Report Status PENDING  Incomplete  Gram stain     Status: None   Collection Time: 01/31/17  2:18 PM  Result Value Ref Range Status   Specimen Description FLUID ASCITIC  Final   Special Requests NONE  Final   Gram Stain   Final    WBC PRESENT, PREDOMINANTLY PMN NO ORGANISMS SEEN CYTOSPIN SMEAR    Report Status 02/01/2017 FINAL  Final      Radiology Studies: US Paracentesis  Result Date: 01/31/2017 INDICATION: Alcoholic  hepatitis, portal hypertension, recurrent ascites. Request made for diagnostic and therapeutic paracentesis. EXAM: ULTRASOUND GUIDED DIAGNOSTIC AND THERAPEUTIC PARACENTESIS MEDICATIONS: None. COMPLICATIONS: None immediate. PROCEDURE: Informed written consent was obtained from the patient after a discussion of the risks, benefits and alternatives to treatment. A  timeout was performed prior to the initiation of the procedure. Initial ultrasound scanning demonstrates a large amount of ascites within the right lower abdominal quadrant. The right lower abdomen was prepped and draped in the usual sterile fashion. 1% lidocaine was used for local anesthesia. Following this, a Yueh catheter was introduced. An ultrasound image was saved for documentation purposes. The paracentesis was performed. The catheter was removed and a dressing was applied. The patient tolerated the procedure well without immediate post procedural complication. FINDINGS: A total of approximately 5.5 liters of yellow fluid was removed. Samples were sent to the laboratory as requested by the clinical team. IMPRESSION: Successful ultrasound-guided diagnostic and therapeutic paracentesis yielding 5.5 liters of peritoneal fluid. Read by: Rowe Robert, PA-C Electronically Signed   By: Corrie Mckusick D.O.   On: 01/31/2017 14:47     Scheduled Meds: . docusate sodium  100 mg Oral BID  . feeding supplement (ENSURE ENLIVE)  237 mL Oral TID BM  . feeding supplement (PRO-STAT SUGAR FREE 64)  30 mL Oral BID  . fentaNYL      . fluconazole  200 mg Oral Daily  . folic acid  1 mg Oral Daily  . [START ON 02/03/2017] furosemide  40 mg Oral Daily  . Gerhardt's butt cream   Topical QID  . lactulose  10 g Oral BID  . midazolam      . multivitamin with minerals  1 tablet Oral Daily  . pantoprazole  40 mg Oral BID  . prednisoLONE  40 mg Oral QAC breakfast  . psyllium  1 packet Oral Daily  . spironolactone  100 mg Oral Daily  . thiamine  100 mg Oral Daily    Continuous Infusions: . albumin human       LOS: 2 days   Time Spent in minutes   35 minutes  Jocilynn Grade D.O. on 02/02/2017 at 1:49 PM  Between 7am to 7pm - Pager - [phone]  After 7pm go to [email or web address] - password [email or web address]  And look for the night coverage person covering for me after hours  [facility] Office  [phone]

## 2017-02-02 NOTE — Progress Notes (Signed)
     [facility] Progress Note  Chief Complaint:   Buttock pain   Subjective: Likes the air mattress bed. No complaints.   Objective:  Vital signs in last 24 hours: Temp:  [97.8 F (36.6 C)-98.1 F (36.7 C)] 98.1 F (36.7 C) (06/13 0602) Pulse Rate:  [82-87] 82 (06/13 0602) Resp:  [18] 18 (06/13 0602) BP: (106-127)/(77-82) 108/77 (06/13 0602) SpO2:  [97 %-98 %] 98 % (06/13 0602) Last BM Date: 02/01/17 General:   Alert, emaciated white female in NAD EENT:  Normal hearing, non icteric sclera, conjunctive pink.  Heart:  Regular rate and rhythm; pitting BLL extremity edema, R> L Pulm: Normal respiratory effort Abdomen:  Soft, moderately distended,nontender.  Normal bowel sounds Neurologic:  Alert and  oriented x4;  grossly normal neurologically. Psych:  Alert and cooperative. Normal mood and affect.   Intake/Output from previous day: 06/12 0701 - 06/13 0700 In: 719 [P.O.:719] Out: -    Assessment / Plan:  1. Severe ETOH hepatitis, minimal improvement with time and prednisolone. For liver biopsy today to look for concurrent pathology. She has required 3-4 LVP. Established with liver clinic - [name], NP -On low dose diuretics. daily weights, I&O, 2 gram sodium diet. She has pitting edema of distal extremities some of which may be due to low albumin.  -monitor renal function, BUN up slightly  -monitor neuro function. Some intermittent disorientation to time but not surprising since she is napping during day. -CMET, CBC, INR in am   2. Leukocytosis, slowly improving as of last labs yesterday. May be just steroid related afterall. Hematology evaluating.   3. Severe protein calorie malnutrition. Nutritional supplements per RD    LOS: 2 days   Tye Savoy NP 02/02/2017, 9:11 AM  Pager number [phone]    [facility] Attending   I have taken an interval history, reviewed the chart and examined the patient. I agree with the Advanced Practitioner's  note, impression and recommendations.   I increased furosemide to 40 mg from 20. May need higher dose of spironolactone also.  Liver bx today.  ? Home tomorrow.  Lactulose started yesterday - may need higher dose Gatha Mayer, MD, [facility] [phone] (pager) [phone] after 5 PM, weekends and holidays  02/02/2017 10:32 AM

## 2017-02-03 ENCOUNTER — Inpatient Hospital Stay (HOSPITAL_COMMUNITY): Payer: Managed Care, Other (non HMO)

## 2017-02-03 ENCOUNTER — Other Ambulatory Visit (HOSPITAL_COMMUNITY): Payer: 59

## 2017-02-03 DIAGNOSIS — R000 Tachycardia, unspecified: Secondary | ICD-10-CM

## 2017-02-03 DIAGNOSIS — K7200 Acute and subacute hepatic failure without coma: Secondary | ICD-10-CM

## 2017-02-03 LAB — CBC
HCT: 33.2 % — ABNORMAL LOW (ref 36.0–46.0)
Hemoglobin: 11.5 g/dL — ABNORMAL LOW (ref 12.0–15.0)
MCH: 36.3 pg — AB (ref 26.0–34.0)
MCHC: 34.6 g/dL (ref 30.0–36.0)
MCV: 104.7 fL — AB (ref 78.0–100.0)
PLATELETS: 182 10*3/uL (ref 150–400)
RBC: 3.17 MIL/uL — AB (ref 3.87–5.11)
RDW: 15.4 % (ref 11.5–15.5)
WBC: 45.5 10*3/uL — AB (ref 4.0–10.5)

## 2017-02-03 LAB — URINALYSIS, ROUTINE W REFLEX MICROSCOPIC
Bacteria, UA: NONE SEEN
GLUCOSE, UA: NEGATIVE mg/dL
Hgb urine dipstick: NEGATIVE
KETONES UR: NEGATIVE mg/dL
Leukocytes, UA: NEGATIVE
Nitrite: NEGATIVE
PH: 6 (ref 5.0–8.0)
Protein, ur: NEGATIVE mg/dL

## 2017-02-03 LAB — COMPREHENSIVE METABOLIC PANEL
ALT: 101 U/L — AB (ref 14–54)
AST: 151 U/L — ABNORMAL HIGH (ref 15–41)
Albumin: 2.8 g/dL — ABNORMAL LOW (ref 3.5–5.0)
Alkaline Phosphatase: 647 U/L — ABNORMAL HIGH (ref 38–126)
Anion gap: 9 (ref 5–15)
BUN: 26 mg/dL — AB (ref 6–20)
CALCIUM: 8.8 mg/dL — AB (ref 8.9–10.3)
CHLORIDE: 97 mmol/L — AB (ref 101–111)
CO2: 25 mmol/L (ref 22–32)
CREATININE: 0.48 mg/dL (ref 0.44–1.00)
GFR calc non Af Amer: >60 mL/min (ref >60)
Glucose, Bld: 128 mg/dL — ABNORMAL HIGH (ref 65–99)
Potassium: 5.1 mmol/L (ref 3.5–5.1)
SODIUM: 131 mmol/L — AB (ref 135–145)
Total Bilirubin: 13.5 mg/dL — ABNORMAL HIGH (ref 0.3–1.2)
Total Protein: 5.5 g/dL — ABNORMAL LOW (ref 6.5–8.1)

## 2017-02-03 LAB — PROTIME-INR
INR: 1.71
PROTHROMBIN TIME: 20.2 s — AB (ref 11.4–15.2)

## 2017-02-03 LAB — AMMONIA: Ammonia: 83 umol/L — ABNORMAL HIGH (ref 9–35)

## 2017-02-03 LAB — TSH: TSH: 0.316 u[IU]/mL — ABNORMAL LOW (ref 0.350–4.500)

## 2017-02-03 LAB — MAGNESIUM: MAGNESIUM: 1.6 mg/dL — AB (ref 1.7–2.4)

## 2017-02-03 MED ORDER — LACTULOSE 10 GM/15ML PO SOLN: 20.0000 g | Freq: Four times a day (QID) | ORAL | Status: DC

## 2017-02-03 MED ORDER — FUROSEMIDE 40 MG PO TABS
60.0000 mg | ORAL_TABLET | Freq: Every day | ORAL | Status: DC
  Administered 2017-02-04 – 2017-02-05 (×2): 60 mg via ORAL
  Filled 2017-02-03 (×2): qty 1

## 2017-02-03 MED ORDER — LACTULOSE 10 GM/15ML PO SOLN
20.0000 g | Freq: Three times a day (TID) | ORAL | Status: DC
  Administered 2017-02-03 – 2017-02-04 (×3): 20 g via ORAL
  Filled 2017-02-03 (×3): qty 30

## 2017-02-03 MED ORDER — LACTULOSE 10 GM/15ML PO SOLN: 10.0000 g | Freq: Three times a day (TID) | ORAL | Status: DC

## 2017-02-03 MED ORDER — SPIRONOLACTONE 25 MG PO TABS
150.0000 mg | ORAL_TABLET | Freq: Every day | ORAL | Status: DC
  Administered 2017-02-04 – 2017-02-05 (×2): 150 mg via ORAL
  Filled 2017-02-03 (×2): qty 6

## 2017-02-03 NOTE — Progress Notes (Signed)
PROGRESS NOTE    Mercedes Mitchell  DUK:[id number] DOB: 06/16/1961 DOA: 01/30/2017 PCP: [place], Betty G, MD   Chief Complaint  Patient presents with  . Abdominal Pain    Brief Narrative:  HPI on 01/30/2017 by Dr. Karmen Bongo Mercedes Mitchell is a 56 y.o. female with medical history significant of alcoholic cirrhosis with ascites, anemia, and malnutrition presenting with abdominal pain.  She reports that she has a "thing" on her butt - like an abscess, extraordinarily painful (stage 2 pressure ulcer); not sleeping, "I've got all this fluid in my body."  Hard to get up and down due to ascites.  Swollen lymph nodes in neck "recently." Fluid in abdomen is worsening.  She went to an [place] last week and tripped and hit her head about 1 week ago, requiring sutures (recently removed).  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   Interim history Patient admitted for abdominal pain with worsening distention. Underwent paracentesis with 5.5 L of fluid removed. GI consulted and appreciated. s/p liver biopsy with interventional radiology and repeat paracentesis.  Assessment & Plan   Abdominal pain/worsening abdominal distention/acute alcoholic hepatitis with liver cirrhosis -Gastroneurology consulted and appreciated -Interventional radiology consult and appreciated -Status post US paracentesis with 5.5 L removed on 01/31/2017 -s/p US paracentesis with 2.4L removed on 02/02/2017 and Fluoro guided transjugular hepatic biopsy  -LFTs currently stable -Continue lasix and spironolactone -pending biopsy results -monitor intake/output, daily weights  Severe protein calorie malnutrition -Nutrition consulted and appreciated, continue supplements  Macrocytic anemia -Baseline hemoglobin 11 -Hemoglobin currently stable 10.3 -Continue to monitor CBC  Leukocytosis -Likely secondary to prednisone -Chest x-ray and UA unremarkable for infection on 6/11 -Oncology/hematology consulted and appreciated, recommended treating  underlying illness -Fluid labs do not indicate SBP -Patient currently afebrile, with no signs of infection -Obtained repeat UA and CXR given increasing WBC, 45.5 -CXR and UA unremarkable for infection today -Continue to monitor CBC  History of severe Candida esophagitis -Noted on EGD done in April 2018 -Diflucan course completed and discontinued by GI  Pressure ulcer -Wound care consulted and appreciated -on buttock -Clean, no erythema or drainage -Continue air mattress  Possible depression -Likely secondary to the above issues -Psychiatry consulted and appreciated. Patient refused psych meds  Chronic hyponatremia -Sodium appears to be stable  Sepsis ruled out -Suspected on admission, no source found  Acute encephalopathy, hepatic -Ammonia level slightly elevated 83 today -lactulose increased to TID -Patient currently AAOx3  Sinus tachycardia -Possibly due to anxiety? As patient is waiting on her biopsy results -Will obtain magnesium level, TSH -potassium 5.1  DVT Prophylaxis  SCDs  Code Status: Full  Family Communication: None at bedside  Disposition Plan: Admitted. Possible discharge to home in 24-48 hours. Pending PT consult  Consultants Gastroenterology Oncology/Hematology Interventional radiology Psychiatry   Procedures  US guided paracentesis x2 Fluoroscopy guided transjugular hepatic biopsy  Antibiotics   Anti-infectives    Start     Dose/Rate Route Frequency Ordered Stop   01/31/17 1200  vancomycin (VANCOCIN) IVPB 750 mg/150 ml premix  Status:  Discontinued     750 mg 150 mL/hr over 60 Minutes Intravenous Every 12 hours 01/31/17 0333 01/31/17 1048   01/31/17 1000  fluconazole (DIFLUCAN) tablet 200 mg  Status:  Discontinued     200 mg Oral Daily 01/31/17 0130 02/03/17 1034   01/31/17 0600  ceFEPIme (MAXIPIME) 2 g in dextrose 5 % 50 mL IVPB  Status:  Discontinued     2 g 100 mL/hr over 30 Minutes Intravenous Every  8 hours 01/31/17 0210 01/31/17  1047   01/31/17 0145  vancomycin (VANCOCIN) IVPB 1000 mg/200 mL premix     1,000 mg 200 mL/hr over 60 Minutes Intravenous  Once 01/31/17 0130 01/31/17 0330   01/30/17 2230  cefTRIAXone (ROCEPHIN) 1 g in dextrose 5 % 50 mL IVPB     1 g 100 mL/hr over 30 Minutes Intravenous  Once 01/30/17 2225 01/30/17 2348      Subjective:   Mercedes Mitchell seen and examined today.  Patient feeling better today, but slightly weak. Denies chest pain, shortness of breath, abdominal pain, nausea, vomiting, constipation. Wanting the results of her biopsy and worried.   Objective:   Vitals:   02/02/17 1432 02/02/17 2126 02/03/17 0539 02/03/17 0800  BP: 1_0 96/65  Pulse: 99 (!) 126 (!) 115 (!) 125  Resp: _1 Temp: 98 F (36.7 C) 98.4 F (36.9 C) 97.9 F (36.6 C) 98.4 F (36.9 C)  TempSrc: Oral Oral Oral Oral  SpO2: 99% 96% 98% 98%  Weight:      Height:        Intake/Output Summary (Last 24 hours) at 02/03/17 1231 Last data filed at 02/03/17 7654  Gross per 24 hour  Intake              360 ml  Output                0 ml  Net              360 ml   Filed Weights   01/30/17 1925  Weight: 51.7 kg (114 lb)   Exam  General: Well developed, chronically ill appearing, cachetic, no distress  HEENT: NCAT, Scleral icterus, mucous membranes moist. Mid-Forehead abrasion.   Cardiovascular: S1 S2 auscultated, tachycardic, no murmurs  Respiratory: Clear to auscultation bilaterally with equal chest rise, no wheezing   Abdomen: Soft, nontender, nondistended, + bowel sounds  Extremities: warm dry without cyanosis clubbing. LE edema.   Neuro: AAOx3, nonfocal  Psych: Anxious, however appropriate mood and affect  Data Reviewed: I have personally reviewed following labs and imaging studies  CBC:  Recent Labs Lab 01/30/17 1957 01/31/17 0630 02/01/17 0845 02/02/17 0815 02/03/17 0759  WBC 41.6* 35.1* 29.5* 36.3* 45.5*  NEUTROABS  --   --  27.1* 32.8*  --   HGB 11.8* 9.7*  9.1* 10.3* 11.5*  HCT 32.8* 27.3* 25.2* 29.5* 33.2*  MCV 102.5* 102.6* 103.7* 103.5* 104.7*  PLT 226 187 161 184 650   Basic Metabolic Panel:  Recent Labs Lab 01/30/17 1957 01/31/17 0630 02/01/17 0845 02/02/17 0815 02/03/17 0759  NA 129* 131* 131* 134* 131*  K 5.2* 4.5 4.0 4.4 5.1  CL 99* 100* 100* 101 97*  CO2 21* _2 GLUCOSE 129* 99 123* 88 128*  BUN 22* 23* 22* 25* 26*  CREATININE 0.52 0.50 0.44 0.44 0.48  CALCIUM 8.1* 8.1* 8.0* 8.6* 8.8*   GFR: Estimated Creatinine Clearance: 65.6 mL/min (by C-G formula based on SCr of 0.48 mg/dL). Liver Function Tests:  Recent Labs Lab 01/30/17 1957 01/31/17 0630 02/01/17 0845 02/02/17 0815 02/03/17 0759  AST 115* 80* 57* 72* 151*  ALT 108* 82* 62* 69* 101*  ALKPHOS 646* 498* 441* 525* 647*  BILITOT 14.0* 11.3* 10.3* 9.5* 13.5*  PROT 5.3* 5.0* 4.6* 4.9* 5.5*  ALBUMIN 2.1* 2.4* 2.5* 2.6* 2.8*    Recent Labs Lab 01/30/17 1957  LIPASE 37    Recent Labs Lab  01/30/17 2140 02/03/17 0759  AMMONIA 102* 83*   Coagulation Profile:  Recent Labs Lab 01/31/17 0216 02/02/17 0815 02/03/17 0759  INR 1.54 1.50 1.71   Cardiac Enzymes: No results for input(s): CKTOTAL, CKMB, CKMBINDEX, TROPONINI in the last 168 hours. BNP (last 3 results) No results for input(s): PROBNP in the last 8760 hours. HbA1C: No results for input(s): HGBA1C in the last 72 hours. CBG: No results for input(s): GLUCAP in the last 168 hours. Lipid Profile: No results for input(s): CHOL, HDL, LDLCALC, TRIG, CHOLHDL, LDLDIRECT in the last 72 hours. Thyroid Function Tests: No results for input(s): TSH, T4TOTAL, FREET4, T3FREE, THYROIDAB in the last 72 hours. Anemia Panel: No results for input(s): VITAMINB12, FOLATE, FERRITIN, TIBC, IRON, RETICCTPCT in the last 72 hours. Urine analysis:    Component Value Date/Time   COLORURINE AMBER (A) 02/03/2017 1025   APPEARANCEUR CLEAR 02/03/2017 1025   LABSPEC <1.005 (L) 02/03/2017 1025   PHURINE 6.0  02/03/2017 1025   GLUCOSEU NEGATIVE 02/03/2017 1025   HGBUR NEGATIVE 02/03/2017 1025   HGBUR large 04/13/2010 1228   BILIRUBINUR LARGE (A) 02/03/2017 1025   KETONESUR NEGATIVE 02/03/2017 1025   PROTEINUR NEGATIVE 02/03/2017 1025   UROBILINOGEN 0.2 04/13/2010 1228   NITRITE NEGATIVE 02/03/2017 1025   LEUKOCYTESUR NEGATIVE 02/03/2017 1025   Sepsis Labs: _0 (procalcitonin:4,lacticidven:4)  ) Recent Results (from the past 240 hour(s))  Blood culture (routine x 2)     Status: None (Preliminary result)   Collection Time: 01/30/17  9:40 PM  Result Value Ref Range Status   Specimen Description BLOOD BLOOD LEFT FOREARM  Final   Special Requests   Final    BOTTLES DRAWN AEROBIC AND ANAEROBIC Blood Culture adequate volume   Culture   Final    NO GROWTH 2 DAYS Performed at [facility], [place] [address] [place], [place] [address]    Report Status PENDING  Incomplete  Blood culture (routine x 2)     Status: None (Preliminary result)   Collection Time: 01/30/17  9:45 PM  Result Value Ref Range Status   Specimen Description BLOOD BLOOD RIGHT FOREARM  Final   Special Requests   Final    BOTTLES DRAWN AEROBIC ONLY Blood Culture adequate volume   Culture   Final    NO GROWTH 2 DAYS Performed at [facility], [place] [address], [place], [place] [address]    Report Status PENDING  Incomplete  Surgical PCR screen     Status: None   Collection Time: 01/31/17  2:08 AM  Result Value Ref Range Status   MRSA, PCR NEGATIVE NEGATIVE Final   Staphylococcus aureus NEGATIVE NEGATIVE Final    Comment:        The Xpert SA Assay (FDA approved for NASAL specimens in patients over 1 years of age), is one component of a comprehensive surveillance program.  Test performance has been validated by [facility] for patients greater than or equal to 68 year old. It is not intended to diagnose infection nor to guide or monitor treatment.   Culture, body fluid-bottle     Status: None  (Preliminary result)   Collection Time: 01/31/17  2:18 PM  Result Value Ref Range Status   Specimen Description FLUID ASCITIC  Final   Special Requests BOTTLES DRAWN AEROBIC AND ANAEROBIC  Final   Culture NO GROWTH 2 DAYS  Final   Report Status PENDING  Incomplete  Gram stain     Status: None   Collection Time: 01/31/17  2:18  PM  Result Value Ref Range Status   Specimen Description FLUID ASCITIC  Final   Special Requests NONE  Final   Gram Stain   Final    WBC PRESENT, PREDOMINANTLY PMN NO ORGANISMS SEEN CYTOSPIN SMEAR    Report Status 02/01/2017 FINAL  Final      Radiology Studies: Ir Venogram Hepatic W Hemodynamic Evaluation  Result Date: 02/02/2017 INDICATION: History of alcoholic cirrhosis and ascites. Please perform ultrasound-guided paracentesis and transjugular liver biopsy with acquisition of pressure measurements for diagnostic and therapeutic purposes. EXAM: 1. FLUOROSCOPIC GUIDED TRANSJUGULAR LIVER BIOPSY WITH ACQUISITION OF PRESSURE MEASUREMENTS 2. ULTRASOUND GUIDANCE FOR VENOUS ACCESS 3. ULTRASOUND-GUIDED PARACENTESIS COMPARISON:  CT abdomen pelvis - 12/23/2016; ultrasound-guided paracentesis - 01/31/2017; 01/18/2017 MEDICATIONS: None CONTRAST:  20 cc Isovue-300 ANESTHESIA/SEDATION: Moderate (conscious) sedation was employed during this procedure. A total of Versed 3 mg and Fentanyl 75 mcg was administered intravenously. Moderate Sedation Time: 35 minutes. The patient's level of consciousness and vital signs were monitored continuously by radiology nursing throughout the procedure under my direct supervision. FLUOROSCOPY TIME:  5 minutes 48 seconds (46 mGy) COMPLICATIONS: None immediate. TECHNIQUE: Informed written consent was obtained from the patient after a discussion of the risks, benefits and alternatives to treatment. Questions regarding the procedure were encouraged and answered. A timeout was performed prior to the initiation of the procedure. Ultrasound scanning of the  right lower abdominal quadrant demonstrated a moderate amount of intra-abdominal ascites. The skin overlying the lateral aspect of the right mid/lower abdomen was prepped and draped in usual sterile fashion. The overlying skin was anesthetized with 1% lidocaine. This allowed for placement of a Safe-T-Centesis catheter. The paracentesis was performed yielding 2700 cc of dark yellow serous ascitic fluid. The Safe-T-Centesis catheter was removed intact and a dressing was placed. Attention was now paid towards the transjugular liver biopsy. The right neck was prepped and draped in the usual sterile fashion, and a sterile drape was applied covering the operative field. Maximum barrier sterile technique with sterile gowns and gloves were used for the procedure. A timeout was performed prior to the initiation of the procedure. Local anesthesia was provided with 1% lidocaine with epinephrine. The right internal jugular vein was accessed with a micropuncture kit under direct ultrasound guidance. An ultrasound image was saved for documentation purposes. The micropuncture sheath was exchanged for a 9-French vascular sheath over a Benson wire. A 100 cm Bernstein catheter was advanced over a stiff Glidewire to select the middle hepatic vein. A hepatic venogram was performed. Free hepatic and right atrial pressures were obtained with the Berenstein catheter. The Berenstein catheter was exchanged for a Dextera transjugular biopsy sheath. 3 core needle biopsies were obtained and anterior to the middle hepatic vein under direct fluoroscopic guidance. Samples were deemed adequate, placed in formalin and submitted to pathology. At this point the procedure was terminated. All wires, catheters and sheaths removed and the patient. Hemostasis was achieved at the right neck access site with manual compression. A dressing was placed. The patient tolerated the procedure well without immediate postprocedural complication. FINDINGS: Normal  hepatic venogram.  Pressure measurements as follows: Free hepatic mean pressure - 12 mmHg Wedged hepatic mean pressure - 29 mmHg Gradient 17 Successful acquisition of 3 core needle biopsies anterior to the middle hepatic vein. IMPRESSION: 1. Portosystemic gradient of 17 compatible with portal venous hypertension. 2. Successful fluoroscopic guided transjugular liver biopsy. 3. Normal hepatic venogram. PLAN: Unfortunately current MELD score of 21 suggests patient would be a poor candidate for potential  TIPS creation for recurrent symptomatic ascites. Would recommend continued medical optimization and if MELD score were to improve (ideally < 14) , the patient could be referred to the Interventional Radiology Clinic [phone]) for formal TIPS consultation as indicated. Electronically Signed   By: Sandi Mariscal M.D.   On: 02/02/2017 15:09   Ir Transcatheter Bx  Result Date: 02/02/2017 INDICATION: History of alcoholic cirrhosis and ascites. Please perform ultrasound-guided paracentesis and transjugular liver biopsy with acquisition of pressure measurements for diagnostic and therapeutic purposes. EXAM: 1. FLUOROSCOPIC GUIDED TRANSJUGULAR LIVER BIOPSY WITH ACQUISITION OF PRESSURE MEASUREMENTS 2. ULTRASOUND GUIDANCE FOR VENOUS ACCESS 3. ULTRASOUND-GUIDED PARACENTESIS COMPARISON:  CT abdomen pelvis - 12/23/2016; ultrasound-guided paracentesis - 01/31/2017; 01/18/2017 MEDICATIONS: None CONTRAST:  20 cc Isovue-300 ANESTHESIA/SEDATION: Moderate (conscious) sedation was employed during this procedure. A total of Versed 3 mg and Fentanyl 75 mcg was administered intravenously. Moderate Sedation Time: 35 minutes. The patient's level of consciousness and vital signs were monitored continuously by radiology nursing throughout the procedure under my direct supervision. FLUOROSCOPY TIME:  5 minutes 48 seconds (46 mGy) COMPLICATIONS: None immediate. TECHNIQUE: Informed written consent was obtained from the patient after a discussion  of the risks, benefits and alternatives to treatment. Questions regarding the procedure were encouraged and answered. A timeout was performed prior to the initiation of the procedure. Ultrasound scanning of the right lower abdominal quadrant demonstrated a moderate amount of intra-abdominal ascites. The skin overlying the lateral aspect of the right mid/lower abdomen was prepped and draped in usual sterile fashion. The overlying skin was anesthetized with 1% lidocaine. This allowed for placement of a Safe-T-Centesis catheter. The paracentesis was performed yielding 2700 cc of dark yellow serous ascitic fluid. The Safe-T-Centesis catheter was removed intact and a dressing was placed. Attention was now paid towards the transjugular liver biopsy. The right neck was prepped and draped in the usual sterile fashion, and a sterile drape was applied covering the operative field. Maximum barrier sterile technique with sterile gowns and gloves were used for the procedure. A timeout was performed prior to the initiation of the procedure. Local anesthesia was provided with 1% lidocaine with epinephrine. The right internal jugular vein was accessed with a micropuncture kit under direct ultrasound guidance. An ultrasound image was saved for documentation purposes. The micropuncture sheath was exchanged for a 9-French vascular sheath over a Benson wire. A 100 cm Bernstein catheter was advanced over a stiff Glidewire to select the middle hepatic vein. A hepatic venogram was performed. Free hepatic and right atrial pressures were obtained with the Berenstein catheter. The Berenstein catheter was exchanged for a Dextera transjugular biopsy sheath. 3 core needle biopsies were obtained and anterior to the middle hepatic vein under direct fluoroscopic guidance. Samples were deemed adequate, placed in formalin and submitted to pathology. At this point the procedure was terminated. All wires, catheters and sheaths removed and the patient.  Hemostasis was achieved at the right neck access site with manual compression. A dressing was placed. The patient tolerated the procedure well without immediate postprocedural complication. FINDINGS: Normal hepatic venogram.  Pressure measurements as follows: Free hepatic mean pressure - 12 mmHg Wedged hepatic mean pressure - 29 mmHg Gradient 17 Successful acquisition of 3 core needle biopsies anterior to the middle hepatic vein. IMPRESSION: 1. Portosystemic gradient of 17 compatible with portal venous hypertension. 2. Successful fluoroscopic guided transjugular liver biopsy. 3. Normal hepatic venogram. PLAN: Unfortunately current MELD score of 21 suggests patient would be a poor candidate for potential TIPS creation for recurrent symptomatic  ascites. Would recommend continued medical optimization and if MELD score were to improve (ideally < 14) , the patient could be referred to the Interventional Radiology Clinic [phone]) for formal TIPS consultation as indicated. Electronically Signed   By: Sandi Mariscal M.D.   On: 02/02/2017 15:09   Ir US Guide Vasc Access Right  Result Date: 02/02/2017 INDICATION: History of alcoholic cirrhosis and ascites. Please perform ultrasound-guided paracentesis and transjugular liver biopsy with acquisition of pressure measurements for diagnostic and therapeutic purposes. EXAM: 1. FLUOROSCOPIC GUIDED TRANSJUGULAR LIVER BIOPSY WITH ACQUISITION OF PRESSURE MEASUREMENTS 2. ULTRASOUND GUIDANCE FOR VENOUS ACCESS 3. ULTRASOUND-GUIDED PARACENTESIS COMPARISON:  CT abdomen pelvis - 12/23/2016; ultrasound-guided paracentesis - 01/31/2017; 01/18/2017 MEDICATIONS: None CONTRAST:  20 cc Isovue-300 ANESTHESIA/SEDATION: Moderate (conscious) sedation was employed during this procedure. A total of Versed 3 mg and Fentanyl 75 mcg was administered intravenously. Moderate Sedation Time: 35 minutes. The patient's level of consciousness and vital signs were monitored continuously by radiology nursing  throughout the procedure under my direct supervision. FLUOROSCOPY TIME:  5 minutes 48 seconds (46 mGy) COMPLICATIONS: None immediate. TECHNIQUE: Informed written consent was obtained from the patient after a discussion of the risks, benefits and alternatives to treatment. Questions regarding the procedure were encouraged and answered. A timeout was performed prior to the initiation of the procedure. Ultrasound scanning of the right lower abdominal quadrant demonstrated a moderate amount of intra-abdominal ascites. The skin overlying the lateral aspect of the right mid/lower abdomen was prepped and draped in usual sterile fashion. The overlying skin was anesthetized with 1% lidocaine. This allowed for placement of a Safe-T-Centesis catheter. The paracentesis was performed yielding 2700 cc of dark yellow serous ascitic fluid. The Safe-T-Centesis catheter was removed intact and a dressing was placed. Attention was now paid towards the transjugular liver biopsy. The right neck was prepped and draped in the usual sterile fashion, and a sterile drape was applied covering the operative field. Maximum barrier sterile technique with sterile gowns and gloves were used for the procedure. A timeout was performed prior to the initiation of the procedure. Local anesthesia was provided with 1% lidocaine with epinephrine. The right internal jugular vein was accessed with a micropuncture kit under direct ultrasound guidance. An ultrasound image was saved for documentation purposes. The micropuncture sheath was exchanged for a 9-French vascular sheath over a Benson wire. A 100 cm Bernstein catheter was advanced over a stiff Glidewire to select the middle hepatic vein. A hepatic venogram was performed. Free hepatic and right atrial pressures were obtained with the Berenstein catheter. The Berenstein catheter was exchanged for a Dextera transjugular biopsy sheath. 3 core needle biopsies were obtained and anterior to the middle hepatic  vein under direct fluoroscopic guidance. Samples were deemed adequate, placed in formalin and submitted to pathology. At this point the procedure was terminated. All wires, catheters and sheaths removed and the patient. Hemostasis was achieved at the right neck access site with manual compression. A dressing was placed. The patient tolerated the procedure well without immediate postprocedural complication. FINDINGS: Normal hepatic venogram.  Pressure measurements as follows: Free hepatic mean pressure - 12 mmHg Wedged hepatic mean pressure - 29 mmHg Gradient 17 Successful acquisition of 3 core needle biopsies anterior to the middle hepatic vein. IMPRESSION: 1. Portosystemic gradient of 17 compatible with portal venous hypertension. 2. Successful fluoroscopic guided transjugular liver biopsy. 3. Normal hepatic venogram. PLAN: Unfortunately current MELD score of 21 suggests patient would be a poor candidate for potential TIPS creation for recurrent symptomatic ascites. Would  recommend continued medical optimization and if MELD score were to improve (ideally < 14) , the patient could be referred to the [facility] [phone]) for formal TIPS consultation as indicated. Electronically Signed   By: Sandi Mariscal M.D.   On: 02/02/2017 15:09   Dg Chest Port 1 View  Result Date: 02/03/2017 CLINICAL DATA:  Leukocytosis EXAM: PORTABLE CHEST 1 VIEW COMPARISON:  01/31/2017 FINDINGS: Normal heart size and mediastinal contours. No acute infiltrate or edema. No effusion or pneumothorax. No acute osseous findings. IMPRESSION: Negative portable chest. Electronically Signed   By: Monte Fantasia M.D.   On: 02/03/2017 09:41   Ir Paracentesis  Result Date: 02/02/2017 INDICATION: History of alcoholic cirrhosis and ascites. Please perform ultrasound-guided paracentesis and transjugular liver biopsy with acquisition of pressure measurements for diagnostic and therapeutic purposes. EXAM: 1. FLUOROSCOPIC GUIDED  TRANSJUGULAR LIVER BIOPSY WITH ACQUISITION OF PRESSURE MEASUREMENTS 2. ULTRASOUND GUIDANCE FOR VENOUS ACCESS 3. ULTRASOUND-GUIDED PARACENTESIS COMPARISON:  CT abdomen pelvis - 12/23/2016; ultrasound-guided paracentesis - 01/31/2017; 01/18/2017 MEDICATIONS: None CONTRAST:  20 cc Isovue-300 ANESTHESIA/SEDATION: Moderate (conscious) sedation was employed during this procedure. A total of Versed 3 mg and Fentanyl 75 mcg was administered intravenously. Moderate Sedation Time: 35 minutes. The patient's level of consciousness and vital signs were monitored continuously by radiology nursing throughout the procedure under my direct supervision. FLUOROSCOPY TIME:  5 minutes 48 seconds (46 mGy) COMPLICATIONS: None immediate. TECHNIQUE: Informed written consent was obtained from the patient after a discussion of the risks, benefits and alternatives to treatment. Questions regarding the procedure were encouraged and answered. A timeout was performed prior to the initiation of the procedure. Ultrasound scanning of the right lower abdominal quadrant demonstrated a moderate amount of intra-abdominal ascites. The skin overlying the lateral aspect of the right mid/lower abdomen was prepped and draped in usual sterile fashion. The overlying skin was anesthetized with 1% lidocaine. This allowed for placement of a Safe-T-Centesis catheter. The paracentesis was performed yielding 2700 cc of dark yellow serous ascitic fluid. The Safe-T-Centesis catheter was removed intact and a dressing was placed. Attention was now paid towards the transjugular liver biopsy. The right neck was prepped and draped in the usual sterile fashion, and a sterile drape was applied covering the operative field. Maximum barrier sterile technique with sterile gowns and gloves were used for the procedure. A timeout was performed prior to the initiation of the procedure. Local anesthesia was provided with 1% lidocaine with epinephrine. The right internal jugular vein  was accessed with a micropuncture kit under direct ultrasound guidance. An ultrasound image was saved for documentation purposes. The micropuncture sheath was exchanged for a 9-French vascular sheath over a Benson wire. A 100 cm Bernstein catheter was advanced over a stiff Glidewire to select the middle hepatic vein. A hepatic venogram was performed. Free hepatic and right atrial pressures were obtained with the Berenstein catheter. The Berenstein catheter was exchanged for a Dextera transjugular biopsy sheath. 3 core needle biopsies were obtained and anterior to the middle hepatic vein under direct fluoroscopic guidance. Samples were deemed adequate, placed in formalin and submitted to pathology. At this point the procedure was terminated. All wires, catheters and sheaths removed and the patient. Hemostasis was achieved at the right neck access site with manual compression. A dressing was placed. The patient tolerated the procedure well without immediate postprocedural complication. FINDINGS: Normal hepatic venogram.  Pressure measurements as follows: Free hepatic mean pressure - 12 mmHg Wedged hepatic mean pressure - 29 mmHg Gradient 17 Successful acquisition of  3 core needle biopsies anterior to the middle hepatic vein. IMPRESSION: 1. Portosystemic gradient of 17 compatible with portal venous hypertension. 2. Successful fluoroscopic guided transjugular liver biopsy. 3. Normal hepatic venogram. PLAN: Unfortunately current MELD score of 21 suggests patient would be a poor candidate for potential TIPS creation for recurrent symptomatic ascites. Would recommend continued medical optimization and if MELD score were to improve (ideally < 14) , the patient could be referred to the Interventional Radiology Clinic [phone]) for formal TIPS consultation as indicated. Electronically Signed   By: Sandi Mariscal M.D.   On: 02/02/2017 15:09     Scheduled Meds: . docusate sodium  100 mg Oral BID  . feeding supplement  (ENSURE ENLIVE)  237 mL Oral TID BM  . feeding supplement (PRO-STAT SUGAR FREE 64)  30 mL Oral BID  . folic acid  1 mg Oral Daily  . furosemide  40 mg Oral Daily  . Gerhardt's butt cream   Topical QID  . lactulose  10 g Oral TID  . multivitamin with minerals  1 tablet Oral Daily  . pantoprazole  40 mg Oral BID  . prednisoLONE  40 mg Oral QAC breakfast  . psyllium  1 packet Oral Daily  . spironolactone  100 mg Oral Daily  . thiamine  100 mg Oral Daily   Continuous Infusions:    LOS: 3 days   Time Spent in minutes   35 minutes  Chanel Mcadams D.O. on 02/03/2017 at 12:31 PM  Between 7am to 7pm - Pager - [phone]  After 7pm go to [email or web address] - password [email or web address]  And look for the night coverage person covering for me after hours  [facility] Office  [phone]

## 2017-02-03 NOTE — Evaluation (Signed)
Occupational Therapy Evaluation Patient Details Name: Mercedes Mitchell MRN: [id number] DOB: 08-16-1961 Today's Date: 02/03/2017    History of Present Illness Mercedes Mitchell is a 56 y.o. female with medical history significant of alcoholic cirrhosis with ascites, anemia, and malnutrition presenting with abdominal pain. Per chart, she has stage 2 pressure ulcer, not sleeping, hard to get up and down due to ascites, and recent fall. Pt s/p liver biopsy and paracentesis.   Clinical Impression   Pt was admitted for the above.  Pt states she has variable performance with adls due to swelling and she has had a recent fall. She will benefit from continued OT to increase safety and independence with adls.  Goals in acute are for min guard to supervision levels    Follow Up Recommendations  Home health OT Supervision 24/7; [place] Recommendations  3 in 1 bedside commode 3:1 commode   Recommendations for Other Services       Precautions / Restrictions Precautions Precautions: Fall Precaution Comments: stage 2 sacral wound, cushion for chair Restrictions Weight Bearing Restrictions: No      Mobility Bed Mobility Overal bed mobility: Modified Independent             General bed mobility comments: OOB by PT  Transfers Overall transfer level: Needs assistance Equipment used: Rolling walker (2 wheeled) Transfers: Sit to/from Stand Sit to Stand: Min assist         General transfer comment: light min A to rise and steady. Cues for UE placement    Balance Overall balance assessment: Needs assistance;History of Falls Sitting-balance support: Feet supported Sitting balance-Leahy Scale: Good     Standing balance support: Bilateral upper extremity supported Standing balance-Leahy Scale: Poor                             ADL either performed or assessed with clinical judgement   ADL Overall ADL's : Needs assistance/impaired     Grooming: Oral care;Min  guard;Standing   Upper Body Bathing: Set up;Sitting   Lower Body Bathing: Moderate assistance;Sit to/from stand   Upper Body Dressing : Set up;Sitting   Lower Body Dressing: Moderate assistance;Sit to/from stand   Toilet Transfer: Minimal assistance;Ambulation;BSC;RW   Toileting- Clothing Manipulation and Hygiene: Minimal assistance;Sit to/from stand         General ADL Comments: pt ambulated to bathroom; unsteady. Cued for safety with RW.  Adjusted 3:1 up as she has difficulty getting up from lower surfaces     Vision         Perception     Praxis      Pertinent Vitals/Pain Pain Assessment: Faces Pain Score: 6  Faces Pain Scale: Hurts even more Pain Location: buttocks Pain Descriptors / Indicators: Tender Pain Intervention(s): Limited activity within patient's tolerance;Monitored during session;Premedicated before session;Repositioned     Hand Dominance Right   Extremity/Trunk Assessment Upper Extremity Assessment Upper Extremity Assessment: Generalized weakness          Communication Communication Communication: No difficulties   Cognition Arousal/Alertness: Awake/alert Behavior During Therapy: WFL for tasks assessed/performed Overall Cognitive Status: Within Functional Limits for tasks assessed                                     General Comments  Pt with scab on forehead from recent fall    Exercises  Shoulder Instructions      Home Living Family/patient expects to be discharged to:: Private residence Living Arrangements: Spouse/significant other Available Help at Discharge: Family;Neighbor;Available 24 hours/day Type of Home: House       Home Layout: Two level;Able to live on main level with bedroom/bathroom Alternate Level Stairs-Number of Steps: 3 Alternate Level Stairs-Rails: Left;Right Bathroom Shower/Tub: Occupational psychologist: Standard     Home Equipment: Environmental consultant - 4 wheels          Prior  Functioning/Environment Level of Independence: Independent with assistive device(s)        Comments: uses rollator most of the time        OT Problem List: Decreased strength;Impaired balance (sitting and/or standing);Decreased safety awareness;Decreased knowledge of use of DME or AE;Decreased activity tolerance;Pain      OT Treatment/Interventions: Self-care/ADL training;Balance training;Patient/family education;Energy conservation;DME and/or AE instruction    OT Goals(Current goals can be found in the care plan section) Acute Rehab OT Goals Patient Stated Goal: go home OT Goal Formulation: With patient Time For Goal Achievement: 02/10/17 Potential to Achieve Goals: Good ADL Goals Pt Will Perform Lower Body Bathing: with supervision;sit to/from stand Pt Will Perform Lower Body Dressing: with supervision;sit to/from stand Pt Will Transfer to Toilet: with supervision;ambulating;bedside commode Pt Will Perform Toileting - Clothing Manipulation and hygiene: with supervision;sit to/from stand Pt Will Perform Tub/Shower Transfer: Shower transfer;ambulating;with min guard assist;3 in 1;rolling walker  OT Frequency: Min 2X/week   Barriers to D/C:            Co-evaluation              AM-PAC PT "6 Clicks" Daily Activity     Outcome Measure Help from another person eating meals?: None Help from another person taking care of personal grooming?: A Little Help from another person toileting, which includes using toliet, bedpan, or urinal?: A Little Help from another person bathing (including washing, rinsing, drying)?: A Little Help from another person to put on and taking off regular upper body clothing?: A Little Help from another person to put on and taking off regular lower body clothing?: A Lot 6 Click Score: 18   End of Session    Activity Tolerance: Patient tolerated treatment well Patient left: in chair;with call bell/phone within reach;with chair alarm set  OT Visit  Diagnosis: Unsteadiness on feet (R26.81);Muscle weakness (generalized) (M62.81)                Time: 1660-6301 OT Time Calculation (min): 14 min Charges:  OT General Charges $OT Visit: 1 Procedure OT Evaluation $OT Eval Moderate Complexity: 1 Procedure G-Codes:     Mercedes Mitchell, OTR/L [phone] 02/03/2017  Mercedes Mitchell 02/03/2017, 2:20 PM

## 2017-02-03 NOTE — Progress Notes (Signed)
pts HR has been elevated 120's-130's. Pt has required frequent orientation to time, continues to mistake pm for am (i.e. Thinking it's 1000 instead of 2200) can answer all other orientation questions correctly, can even describe all procedures she underwent today. hospitalist notified. Stat ekg ordered and performed. ekg reading sinus tach, will continue to monitor at this time.

## 2017-02-03 NOTE — Evaluation (Signed)
Physical Therapy Evaluation Patient Details Name: Mercedes Mitchell MRN: [id number] DOB: 09/18/1960 Today's Date: 02/03/2017   History of Present Illness  Mercedes Mitchell is a 56 y.o. female with medical history significant of alcoholic cirrhosis with ascites, anemia, and malnutrition presenting with abdominal pain.  She has stage 2 pressure ulcer, not sleeping, hard to get up and down due to ascites, and recent fall. Pt s/p liver biopsy and paracentesis.  Clinical Impression  Pt admitted with above diagnosis. Pt currently with functional limitations due to the deficits listed below (see PT Problem List).  Pt will benefit from skilled PT to increase their independence and safety with mobility to allow discharge to the venue listed below.  Pt with some unsteadiness and weakness.  Recommend 3-1 BSC and HHPT.     Follow Up Recommendations Home health PT;Supervision for mobility/OOB    Equipment Recommendations  3in1 (PT)    Recommendations for Other Services       Precautions / Restrictions Precautions Precautions: Fall Precaution Comments: stage 2 sacral wound, cushion for chair      Mobility  Bed Mobility Overal bed mobility: Modified Independent                Transfers Overall transfer level: Needs assistance Equipment used: Rolling walker (2 wheeled) Transfers: Sit to/from Stand Sit to Stand: Min assist         General transfer comment: MIN A to power up and for hand placement.  Initial posterior lean  Ambulation/Gait Ambulation/Gait assistance: Min guard Ambulation Distance (Feet): 110 Feet Assistive device: Rolling walker (2 wheeled) Gait Pattern/deviations: Decreased stride length;Ataxic     General Gait Details: Gait slightly ataxic at times especially at the beginning. Gait improved as gait progressed, but did have tendency for posterior sway at times.  Stairs            Wheelchair Mobility    Modified Rankin (Stroke Patients Only)        Balance Overall balance assessment: Needs assistance;History of Falls Sitting-balance support: Feet supported Sitting balance-Leahy Scale: Good     Standing balance support: Bilateral upper extremity supported Standing balance-Leahy Scale: Poor                               Pertinent Vitals/Pain Pain Assessment: 0-10 Pain Score: 6  Pain Location: buttocks Pain Descriptors / Indicators: Tender Pain Intervention(s): Limited activity within patient's tolerance;Monitored during session;Repositioned    Home Living Family/patient expects to be discharged to:: Private residence Living Arrangements: Spouse/significant other Available Help at Discharge: Family;Neighbor;Available 24 hours/day (husband works, but Industrial/product designer can assist) Type of Home: [employer] Layout: Two level;Able to live on main level with bedroom/bathroom Home Equipment: Walker - 4 wheels      Prior Function Level of Independence: Independent with assistive device(s)         Comments: uses rollator most of the time     Hand Dominance   Dominant Hand: Right    Extremity/Trunk Assessment   Upper Extremity Assessment Upper Extremity Assessment: Defer to OT evaluation    Lower Extremity Assessment Lower Extremity Assessment: Generalized weakness       Communication   Communication: No difficulties  Cognition Arousal/Alertness: Awake/alert Behavior During Therapy: WFL for tasks assessed/performed Overall Cognitive Status: Within Functional Limits for tasks assessed  General Comments General comments (skin integrity, edema, etc.): Pt with scab on forehead from recent fall    Exercises     Assessment/Plan    PT Assessment Patient needs continued PT services  PT Problem List Decreased strength;Decreased activity tolerance;Decreased balance;Decreased mobility;Decreased coordination;Decreased knowledge of use of DME        PT Treatment Interventions DME instruction;Gait training;Stair training;Functional mobility training;Therapeutic activities;Therapeutic exercise;Balance training    PT Goals (Current goals can be found in the Care Plan section)  Acute Rehab PT Goals Patient Stated Goal: go home PT Goal Formulation: With patient Time For Goal Achievement: 02/17/17 Potential to Achieve Goals: Good    Frequency Min 3X/week   Barriers to discharge        Co-evaluation               AM-PAC PT "6 Clicks" Daily Activity  Outcome Measure Difficulty turning over in bed (including adjusting bedclothes, sheets and blankets)?: None Difficulty moving from lying on back to sitting on the side of the bed? : None Difficulty sitting down on and standing up from a chair with arms (e.g., wheelchair, bedside commode, etc,.)?: Total Help needed moving to and from a bed to chair (including a wheelchair)?: A Little Help needed walking in hospital room?: A Little Help needed climbing 3-5 steps with a railing? : A Little 6 Click Score: 18    End of Session Equipment Utilized During Treatment: Gait belt Activity Tolerance: Patient tolerated treatment well Patient left: in chair;with call bell/phone within reach;with chair alarm set Nurse Communication: Mobility status PT Visit Diagnosis: Unsteadiness on feet (R26.81);History of falling (Z91.81);Difficulty in walking, not elsewhere classified (R26.2)    Time: 9774-1423 PT Time Calculation (min) (ACUTE ONLY): 23 min   Charges:   PT Evaluation $PT Eval Moderate Complexity: 1 Procedure PT Treatments $Gait Training: 8-22 mins   PT G Codes:        Tayana Shankle L. Tamala Julian, [place] Pager [phone] 02/03/2017   Galen Manila 02/03/2017, 1:12 PM

## 2017-02-03 NOTE — Progress Notes (Addendum)
     [name] Progress Note ASSESSMENT / PLAN:   Severe ETOH hepatitis.  Alert and oriented this am and no asterixis. Got transjugular liver bx yesterday. LFTs, INR worsened overnight. WBC continuously rising but back on full dose of Prednisolone and Hematology has already evaluated her for leukocytosis. CXR, urine negative. She is afebrile.  -stopping Diflucan, she completed treated for candida esophagitis several weeks ago (unless she is getting it for some other reason ).  -peripheral edema better,Diuretics were increased yesterday., 360 ml output yesterday. No weights being done (will reorder). Her renal function is okay -liver bx yesterday. awaiting liver bx results.  -am INR, LFTs, CBC   Intermittent confusion. She is alert and oriented this am. No asterixis.  -continue lactulose, increase to TID -Psych saw her yesterday for depression, she declined psychotropic meds -monitor neuro status.   Severe protein calorie malnutrition.  -Continue supplements.     LOS: 3 days   Mercedes Mitchell ,NP 02/03/2017, 9:17 AM  Pager number [phone]    [facility] Attending   I have taken an interval history, reviewed the chart and examined the patient. I agree with the Advanced Practitioner's note, impression and recommendations.    LFT bump after liver bx - will f/u - otherwise remains sig ill - chronic - continue diuretics and await liver bx. Maybe start thinking about dc soon  Will bump furosemide and bump spironolactone more 60/150  Gatha Mayer, MD, [facility] [phone] (pager) [phone] after 5 PM, weekends and holidays  02/03/2017 12:44 PM   Chief Complaint:   Buttock pain   Subjective: Buttock pain still  Objective:  Vital signs in last 24 hours: Temp:  [97.9 F (36.6 C)-98.4 F (36.9 C)] 98.4 F (36.9 C) (06/14 0800) Pulse Rate:  [94-126] 125 (06/14 0800) Resp:  [10-19] 16 (06/14 0800) BP: (95-115)/(62-85) 96/65 (06/14  0800) SpO2:  [96 %-100 %] 98 % (06/14 0800) Last BM Date: 02/01/17 General:   Alert, emaciated white female in NAD EENT:  Normal hearing, icteric sclera.  Heart:  Sinus tachycardia Pulm: Normal respiratory effort, lungs CTA bilaterally without wheezes or crackles. Abdomen:  Soft, moderately distended - ascites, not tender.   Normal bowel sounds, no masses felt. Neurologic:  Alert and  oriented to person, place and time. No asterixis. Psych:  Pleasant, cooperative.  Normal mood and affect.  Lab Results:  Recent Labs  02/01/17 0845 02/02/17 0815 02/03/17 0759  WBC 29.5* 36.3* 45.5*  HGB 9.1* 10.3* 11.5*  HCT 25.2* 29.5* 33.2*  PLT 161 184 182   BMET  Recent Labs  02/01/17 0845 02/02/17 0815 02/03/17 0759  NA 131* 134* 131*  K 4.0 4.4 5.1  CL 100* 101 97*  CO2 23 25 25   GLUCOSE 123* 88 128*  BUN 22* 25* 26*  CREATININE 0.44 0.44 0.48  CALCIUM 8.0* 8.6* 8.8*   LFT  Recent Labs  02/03/17 0759  PROT 5.5*  ALBUMIN 2.8*  AST 151*  ALT 101*  ALKPHOS 647*  BILITOT 13.5*   PT/INR  Recent Labs  02/02/17 0815 02/03/17 0759  LABPROT 18.3* 20.2*  INR 1.50 1.71   Had US guided paracentesis and IJ liver bx yesterday.

## 2017-02-04 DIAGNOSIS — R946 Abnormal results of thyroid function studies: Secondary | ICD-10-CM

## 2017-02-04 DIAGNOSIS — R5381 Other malaise: Secondary | ICD-10-CM

## 2017-02-04 LAB — CULTURE, BODY FLUID-BOTTLE

## 2017-02-04 LAB — CBC
HEMATOCRIT: 26.4 % — AB (ref 36.0–46.0)
HEMOGLOBIN: 9.4 g/dL — AB (ref 12.0–15.0)
MCH: 36.7 pg — ABNORMAL HIGH (ref 26.0–34.0)
MCHC: 35.6 g/dL (ref 30.0–36.0)
MCV: 103.1 fL — ABNORMAL HIGH (ref 78.0–100.0)
Platelets: 158 10*3/uL (ref 150–400)
RBC: 2.56 MIL/uL — ABNORMAL LOW (ref 3.87–5.11)
RDW: 15.3 % (ref 11.5–15.5)
WBC: 39.1 10*3/uL — AB (ref 4.0–10.5)

## 2017-02-04 LAB — COMPREHENSIVE METABOLIC PANEL
ALBUMIN: 2.3 g/dL — AB (ref 3.5–5.0)
ALT: 69 U/L — ABNORMAL HIGH (ref 14–54)
AST: 57 U/L — AB (ref 15–41)
Alkaline Phosphatase: 491 U/L — ABNORMAL HIGH (ref 38–126)
Anion gap: 9 (ref 5–15)
BILIRUBIN TOTAL: 9.4 mg/dL — AB (ref 0.3–1.2)
BUN: 25 mg/dL — AB (ref 6–20)
CO2: 26 mmol/L (ref 22–32)
Calcium: 8.3 mg/dL — ABNORMAL LOW (ref 8.9–10.3)
Chloride: 96 mmol/L — ABNORMAL LOW (ref 101–111)
Creatinine, Ser: 0.44 mg/dL (ref 0.44–1.00)
GFR calc Af Amer: >60 mL/min (ref >60)
GFR calc non Af Amer: >60 mL/min (ref >60)
GLUCOSE: 112 mg/dL — AB (ref 65–99)
POTASSIUM: 4 mmol/L (ref 3.5–5.1)
SODIUM: 131 mmol/L — AB (ref 135–145)
TOTAL PROTEIN: 4.6 g/dL — AB (ref 6.5–8.1)

## 2017-02-04 LAB — PROTIME-INR
INR: 1.72
Prothrombin Time: 20.4 seconds — ABNORMAL HIGH (ref 11.4–15.2)

## 2017-02-04 LAB — GRAM STAIN

## 2017-02-04 LAB — CULTURE, BODY FLUID W GRAM STAIN -BOTTLE

## 2017-02-04 MED ORDER — LACTULOSE 10 GM/15ML PO SOLN
20.0000 g | Freq: Two times a day (BID) | ORAL | Status: DC
  Administered 2017-02-04 – 2017-02-05 (×2): 20 g via ORAL
  Filled 2017-02-04 (×2): qty 30

## 2017-02-04 MED ORDER — CAMPHOR-MENTHOL 0.5-0.5 % EX LOTN
TOPICAL_LOTION | CUTANEOUS | Status: DC | PRN
  Administered 2017-02-04: 03:00:00 via TOPICAL
  Filled 2017-02-04: qty 222

## 2017-02-04 MED ORDER — MAGNESIUM SULFATE 2 GM/50ML IV SOLN
2.0000 g | Freq: Once | INTRAVENOUS | Status: AC
  Administered 2017-02-04: 2 g via INTRAVENOUS
  Filled 2017-02-04: qty 50

## 2017-02-04 NOTE — Clinical Social Work Note (Signed)
Clinical Social Work Assessment  Patient Details  Name: Mercedes Mitchell MRN: [id number] Date of Birth: [birth year]  Date of referral:  02/03/17               Reason for consult:  Substance Use/ETOH Abuse                Permission sought to share information with:    Permission granted to share information::     Name::        Agency::     Relationship::  Spouse  Contact Information:     Housing/Transportation Living arrangements for the past 2 months:  Single Family Home Source of Information:  Patient Patient Interpreter Needed:  None Criminal Activity/Legal Involvement Pertinent to Current Situation/Hospitalization:  No - Comment as needed Significant Relationships:  Adult Children, Spouse Lives with:  Spouse, Adult Children Do you feel safe going back to the place where you live?  Yes Need for family participation in patient care:  Yes (Comment)  Care giving concerns: Acute alcoholic hepatitis. Outpatient Treatment  resources for Alcohol use.    Social Worker assessment / plan:  CSW met with patient at bedside, explain role and reason for visit. Patient alert and oriented very pleasant. Patient express she has been sober for 41 days and feels great about this accomplishment. Patient reports her surgery (liver biopsy, removal of fluids) went well and she is ready to return home and finish her road to recovery. Patient reports, " I have been going to Alcohol Anonymous group at local church two-three times a week." Patient reports she is also apart of the [place] program. She reports over the past few weeks she has been to weak to attend but her therapist Brandon Melnick has been in contact with her. She plans to return to program when she has recovered from surgery.   Plan: Patient will continue her AA and [place] group.   Employment status:    Insurance information:  Other (Comment Required) Psychologist, counselling) PT Recommendations:  Home with [place] / Referral to community  resources:  Outpatient Substance Abuse Treatment Options  Patient/Family's Response to care:  Agreeable  Patient/Family's Understanding of and Emotional Response to Diagnosis, Current Treatment, and Prognosis:  "My husband drives to my appointments, my family has bee very supportive."  Emotional Assessment Appearance:  Developmentally appropriate Attitude/Demeanor/Rapport:    Affect (typically observed):  Accepting Orientation:  Oriented to Self, Oriented to Place, Oriented to  Time, Oriented to Situation Alcohol / Substance use:  Alcohol Use Psych involvement (Current and /or in the community):  Yes (Comment) (Medicatons? Depression.)  Discharge Needs  Concerns to be addressed:  Substance Abuse Concerns Readmission within the last 30 days:  No Current discharge risk:  None Barriers to Discharge:  Continued Medical Work up   [employer], LCSW 02/04/2017, 10:20 AM

## 2017-02-04 NOTE — Progress Notes (Signed)
   02/04/17 1300  OT Visit Information  Last OT Received On 02/04/17  Assistance Needed +1  History of Present Illness Mercedes Mitchell is a 56 y.o. female with medical history significant of alcoholic cirrhosis with ascites, anemia, and malnutrition presenting with abdominal pain.  She has stage 2 pressure ulcer, not sleeping, hard to get up and down due to ascites, and recent fall. Pt s/p liver biopsy and paracentesis.  Precautions  Precautions Fall  Pain Assessment  Pain Assessment Faces  Faces Pain Scale 2  Pain Location buttocks  Pain Descriptors / Indicators Sore  Pain Intervention(s) Limited activity within patient's tolerance;Monitored during session  Cognition  Arousal/Alertness Awake/alert  Behavior During Therapy WFL for tasks assessed/performed  Overall Cognitive Status Within Functional Limits for tasks assessed  Exercises  Exercises Other exercises  Other Exercises  Other Exercises issued pt level one theraband program focusing on FF, horizontal abduction and elbow flexion.  Pt has handout and needs supervision/cues to perform exercises correctly.  Educated to have RN or NT come in and make sure her IV line is not going to get caught if she decides to do exercises on her own. She very much wants to improve her strength.  Will continue to follow  OT - End of Session  Activity Tolerance Patient tolerated treatment well  Patient left in bed;with call bell/phone within reach;with bed alarm set  OT Assessment/Plan  Follow Up Recommendations Home health OT (as much Supervision as possible initially)  OT Equipment 3 in 1 bedside commode  AM-PAC OT "6 Clicks" Daily Activity Outcome Measure  Help from another person eating meals? 4  Help from another person taking care of personal grooming? 3  Help from another person toileting, which includes using toliet, bedpan, or urinal? 3  Help from another person bathing (including washing, rinsing, drying)? 3  Help from another person  to put on and taking off regular upper body clothing? 3  Help from another person to put on and taking off regular lower body clothing? 2  6 Click Score 18  ADL G Code Conversion CK  OT Goal Progression  Progress towards OT goals Progressing toward goals  Acute Rehab OT Goals  Time For Goal Achievement 02/10/17  OT Time Calculation  OT Start Time (ACUTE ONLY) 1218  OT Stop Time (ACUTE ONLY) 1228  OT Time Calculation (min) 10 min  OT General Charges  $OT Visit 1 Procedure  OT Treatments  $Therapeutic Exercise 8-22 mins  Lesle Chris, OTR/L [phone] 02/04/2017

## 2017-02-04 NOTE — Progress Notes (Signed)
PROGRESS NOTE    Mercedes Mitchell  MRN:[id number] DOB: 10/22/1960 DOA: 01/30/2017 PCP: Jordan, Betty G, MD   Chief Complaint  Patient presents with  . Abdominal Pain    Brief Narrative:  HPI on 01/30/2017 by Dr. Jennifer Yates Mercedes Mitchell is a 56 y.o. female with medical history significant of alcoholic cirrhosis with ascites, anemia, and malnutrition presenting with abdominal pain.  She reports that she has a "thing" on her butt - like an abscess, extraordinarily painful (stage 2 pressure ulcer); not sleeping, "I've got all this fluid in my body."  Hard to get up and down due to ascites.  Swollen lymph nodes in neck "recently." Fluid in abdomen is worsening.  She went to an AA meeting last week and tripped and hit her head about 1 week ago, requiring sutures (recently removed).  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   Interim history Patient admitted for abdominal pain with worsening distention. Underwent paracentesis with 5.5 L of fluid removed. GI consulted and appreciated. s/p liver biopsy with interventional radiology and repeat paracentesis.  Assessment & Plan   Abdominal pain/worsening abdominal distention/Severe acute alcoholic hepatitis with liver cirrhosis -Gastroneurology consulted and appreciated -Interventional radiology consult and appreciated -Status post US paracentesis with 5.5 L removed on 01/31/2017 -s/p US paracentesis with 2.4L removed on 02/02/2017 and Fluoro guided transjugular hepatic biopsy  -LFTs currently stable trending downward from 6/14 -Continue lasix and spironolactone, doses increased by GI -Biopsy results: Cirrhosis with acute hepatitis and cholestasis, increased iron -monitor intake/output, daily weights  Severe protein calorie malnutrition -Nutrition consulted and appreciated, continue supplements  Macrocytic anemia -Baseline hemoglobin 11 -Hemoglobin currently stable 9.4 -Continue to monitor CBC  Leukocytosis -Likely secondary to prednisone -Chest x-ray and  UA unremarkable for infection on 6/11 -Oncology/hematology consulted and appreciated, recommended treating underlying illness -Fluid labs do not indicate SBP -Patient currently afebrile, with no signs of infection -Obtained repeat UA and CXR given increasing WBC, 45.5, both were unremarkable for infeciton -WBC trending downward today, 39.1 -Continue to monitor CBC  History of severe Candida esophagitis -Noted on EGD done in April 2018 -Diflucan course completed and discontinued by GI  Pressure ulcer -Wound care consulted and appreciated -on buttock -Clean, no erythema or drainage -Continue air mattress  Possible depression -Likely secondary to the above issues -Psychiatry consulted and appreciated. Patient refused psych meds -Patient very tearful this morning  Chronic hyponatremia -Sodium appears to be stable  Sepsis ruled out -Suspected on admission, no source found  Acute encephalopathy, hepatic -Ammonia level slightly elevated 83 on 6/14, lactulose increased to TID -Patient currently AAOx3  Hypomagnesemia -Replaced with IV form, continue to monitor   Sinus tachycardia -Possibly due to anxiety? As patient is waiting on her biopsy results -TSH 0.316  -potassium 4 -Magnesium 1.6, and being replaced  Abnormal TSH -TSh 0.316 (sligly low), will obtain FT4 level  Physical deconditioning -PT and OT consulted, recommending home health, 3 and 1  DVT Prophylaxis  SCDs  Code Status: Full  Family Communication: None at bedside  Disposition Plan: Admitted. Possible discharge to home with HH in 24-48 hours, pending further rec from GI.  Consultants Gastroenterology Oncology/Hematology Interventional radiology Psychiatry   Procedures  US guided paracentesis x2 Fluoroscopy guided transjugular hepatic biopsy  Antibiotics   Anti-infectives    Start     Dose/Rate Route Frequency Ordered Stop   01/31/17 1200  vancomycin (VANCOCIN) IVPB 750 mg/150 ml premix  Status:   Discontinued     750 mg 150 mL/hr over 60 Minutes   Intravenous Every 12 hours 01/31/17 0333 01/31/17 1048   01/31/17 1000  fluconazole (DIFLUCAN) tablet 200 mg  Status:  Discontinued     200 mg Oral Daily 01/31/17 0130 02/03/17 1034   01/31/17 0600  ceFEPIme (MAXIPIME) 2 g in dextrose 5 % 50 mL IVPB  Status:  Discontinued     2 g 100 mL/hr over 30 Minutes Intravenous Every 8 hours 01/31/17 0210 01/31/17 1047   01/31/17 0145  vancomycin (VANCOCIN) IVPB 1000 mg/200 mL premix     1,000 mg 200 mL/hr over 60 Minutes Intravenous  Once 01/31/17 0130 01/31/17 0330   01/30/17 2230  cefTRIAXone (ROCEPHIN) 1 g in dextrose 5 % 50 mL IVPB     1 g 100 mL/hr over 30 Minutes Intravenous  Once 01/30/17 2225 01/30/17 2348      Subjective:   Mercedes Mitchell seen and examined today.  Patient feeling very depressed this morning and tearful. States she's been having many loose bowel movements and feels that this is too much. Has poor appetite this morning. Denies chest pain, shortness of breath, abdominal pain, dizziness or headache.   Objective:   Vitals:   02/03/17 0800 02/03/17 1346 02/03/17 2028 02/04/17 0506  BP: 96/65 100/70 99/62 100/67  Pulse: (!) 125 (!) 109 91 96  Resp: 16 16 16 16  Temp: 98.4 F (36.9 C) 98.4 F (36.9 C) 98.4 F (36.9 C) 98.5 F (36.9 C)  TempSrc: Oral Oral Oral Oral  SpO2: 98% 99% 98% 98%  Weight:    46 kg (101 lb 6.6 oz)  Height:        Intake/Output Summary (Last 24 hours) at 02/04/17 1135 Last data filed at 02/04/17 0410  Gross per 24 hour  Intake              240 ml  Output              725 ml  Net             -485 ml   Filed Weights   01/30/17 1925 02/04/17 0506  Weight: 51.7 kg (114 lb) 46 kg (101 lb 6.6 oz)   Exam  General: Well developed, cachectic, chronically ill, no apparent distress  HEENT: NCAT, mid for head abrasion, scleral icterus, mucous membranes moist.   Cardiovascular: S1 S2 auscultated, no rubs, murmurs or gallops. Regular rate and  rhythm.  Respiratory: Clear to auscultation bilaterally with equal chest rise  Abdomen: Soft, nontender, nondistended, + bowel sounds  Extremities: warm dry without cyanosis clubbing. LE edema  Neuro: AAOx3, nonfocal  Skin: Without rashes exudates or nodules  Psych: depressed mood, flat affect, tearful  Data Reviewed: I have personally reviewed following labs and imaging studies  CBC:  Recent Labs Lab 01/31/17 0630 02/01/17 0845 02/02/17 0815 02/03/17 0759 02/04/17 0554  WBC 35.1* 29.5* 36.3* 45.5* 39.1*  NEUTROABS  --  27.1* 32.8*  --   --   HGB 9.7* 9.1* 10.3* 11.5* 9.4*  HCT 27.3* 25.2* 29.5* 33.2* 26.4*  MCV 102.6* 103.7* 103.5* 104.7* 103.1*  PLT 187 161 184 182 158   Basic Metabolic Panel:  Recent Labs Lab 01/31/17 0630 02/01/17 0845 02/02/17 0815 02/03/17 0759 02/03/17 1411 02/04/17 0554  NA 131* 131* 134* 131*  --  131*  K 4.5 4.0 4.4 5.1  --  4.0  CL 100* 100* 101 97*  --  96*  CO2 23 23 25 25  --  26  GLUCOSE 99 123* 88 128*  --    112*  BUN 23* 22* 25* 26*  --  25*  CREATININE 0.50 0.44 0.44 0.48  --  0.44  CALCIUM 8.1* 8.0* 8.6* 8.8*  --  8.3*  MG  --   --   --   --  1.6*  --    GFR: Estimated Creatinine Clearance: 58.4 mL/min (by C-G formula based on SCr of 0.44 mg/dL). Liver Function Tests:  Recent Labs Lab 01/31/17 0630 02/01/17 0845 02/02/17 0815 02/03/17 0759 02/04/17 0554  AST 80* 57* 72* 151* 57*  ALT 82* 62* 69* 101* 69*  ALKPHOS 498* 441* 525* 647* 491*  BILITOT 11.3* 10.3* 9.5* 13.5* 9.4*  PROT 5.0* 4.6* 4.9* 5.5* 4.6*  ALBUMIN 2.4* 2.5* 2.6* 2.8* 2.3*    Recent Labs Lab 01/30/17 1957  LIPASE 37    Recent Labs Lab 01/30/17 2140 02/03/17 0759  AMMONIA 102* 83*   Coagulation Profile:  Recent Labs Lab 01/31/17 0216 02/02/17 0815 02/03/17 0759 02/04/17 0554  INR 1.54 1.50 1.71 1.72   Cardiac Enzymes: No results for input(s): CKTOTAL, CKMB, CKMBINDEX, TROPONINI in the last 168 hours. BNP (last 3 results) No  results for input(s): PROBNP in the last 8760 hours. HbA1C: No results for input(s): HGBA1C in the last 72 hours. CBG: No results for input(s): GLUCAP in the last 168 hours. Lipid Profile: No results for input(s): CHOL, HDL, LDLCALC, TRIG, CHOLHDL, LDLDIRECT in the last 72 hours. Thyroid Function Tests:  Recent Labs  02/03/17 1411  TSH 0.316*   Anemia Panel: No results for input(s): VITAMINB12, FOLATE, FERRITIN, TIBC, IRON, RETICCTPCT in the last 72 hours. Urine analysis:    Component Value Date/Time   COLORURINE AMBER (A) 02/03/2017 1025   APPEARANCEUR CLEAR 02/03/2017 1025   LABSPEC <1.005 (L) 02/03/2017 1025   PHURINE 6.0 02/03/2017 1025   GLUCOSEU NEGATIVE 02/03/2017 1025   HGBUR NEGATIVE 02/03/2017 1025   HGBUR large 04/13/2010 1228   BILIRUBINUR LARGE (A) 02/03/2017 1025   KETONESUR NEGATIVE 02/03/2017 1025   PROTEINUR NEGATIVE 02/03/2017 1025   UROBILINOGEN 0.2 04/13/2010 1228   NITRITE NEGATIVE 02/03/2017 1025   LEUKOCYTESUR NEGATIVE 02/03/2017 1025   Sepsis Labs: @LABRCNTIP(procalcitonin:4,lacticidven:4)  ) Recent Results (from the past 240 hour(s))  Blood culture (routine x 2)     Status: None (Preliminary result)   Collection Time: 01/30/17  9:40 PM  Result Value Ref Range Status   Specimen Description BLOOD BLOOD LEFT FOREARM  Final   Special Requests   Final    BOTTLES DRAWN AEROBIC AND ANAEROBIC Blood Culture adequate volume   Culture   Final    NO GROWTH 4 DAYS Performed at [facility], [address] , [place] [address]    Report Status PENDING  Incomplete  Blood culture (routine x 2)     Status: None (Preliminary result)   Collection Time: 01/30/17  9:45 PM  Result Value Ref Range Status   Specimen Description BLOOD BLOOD RIGHT FOREARM  Final   Special Requests   Final    BOTTLES DRAWN AEROBIC ONLY Blood Culture adequate volume   Culture   Final    NO GROWTH 4 DAYS Performed at [facility], [address]  , [place] [address]    Report Status PENDING  Incomplete  Surgical PCR screen     Status: None   Collection Time: 01/31/17  2:08 AM  Result Value Ref Range Status   MRSA, PCR NEGATIVE NEGATIVE Final   Staphylococcus aureus NEGATIVE NEGATIVE Final      Comment:        The Xpert SA Assay (FDA approved for NASAL specimens in patients over 81 years of age), is one component of a comprehensive surveillance program.  Test performance has been validated by [facility] for patients greater than or equal to 14 year old. It is not intended to diagnose infection nor to guide or monitor treatment.   Culture, body fluid-bottle     Status: None (Preliminary result)   Collection Time: 01/31/17  2:18 PM  Result Value Ref Range Status   Specimen Description FLUID ASCITIC  Final   Special Requests BOTTLES DRAWN AEROBIC AND ANAEROBIC  Final   Culture NO GROWTH 4 DAYS  Final   Report Status PENDING  Incomplete  Gram stain     Status: None   Collection Time: 01/31/17  2:18 PM  Result Value Ref Range Status   Specimen Description FLUID ASCITIC  Final   Special Requests NONE  Final   Gram Stain   Final    WBC PRESENT, PREDOMINANTLY PMN NO ORGANISMS SEEN CYTOSPIN SMEAR    Report Status 02/01/2017 FINAL  Final  Culture, body fluid-bottle     Status: None (Preliminary result)   Collection Time: 01/31/17  2:18 PM  Result Value Ref Range Status   Specimen Description FLUID ASCITIC  Final   Special Requests NONE  Final   Culture   Final    NO GROWTH 4 DAYS Performed at [facility], [place] [address], [place], [place] [address]    Report Status PENDING  Incomplete  Gram stain     Status: None   Collection Time: 01/31/17  2:18 PM  Result Value Ref Range Status   Specimen Description FLUID ASCITIC  Final   Special Requests NONE  Final   Gram Stain   Final    WBC PRESENT, PREDOMINANTLY PMN NO ORGANISMS SEEN CYTOSPIN SMEAR Performed at [facility], [place] [address], [place],  [place] [address]    Report Status 02/04/2017 FINAL  Final      Radiology Studies: Ir Venogram Hepatic W Hemodynamic Evaluation  Result Date: 02/02/2017 INDICATION: History of alcoholic cirrhosis and ascites. Please perform ultrasound-guided paracentesis and transjugular liver biopsy with acquisition of pressure measurements for diagnostic and therapeutic purposes. EXAM: 1. FLUOROSCOPIC GUIDED TRANSJUGULAR LIVER BIOPSY WITH ACQUISITION OF PRESSURE MEASUREMENTS 2. ULTRASOUND GUIDANCE FOR VENOUS ACCESS 3. ULTRASOUND-GUIDED PARACENTESIS COMPARISON:  CT abdomen pelvis - 12/23/2016; ultrasound-guided paracentesis - 01/31/2017; 01/18/2017 MEDICATIONS: None CONTRAST:  20 cc Isovue-300 ANESTHESIA/SEDATION: Moderate (conscious) sedation was employed during this procedure. A total of Versed 3 mg and Fentanyl 75 mcg was administered intravenously. Moderate Sedation Time: 35 minutes. The patient's level of consciousness and vital signs were monitored continuously by radiology nursing throughout the procedure under my direct supervision. FLUOROSCOPY TIME:  5 minutes 48 seconds (46 mGy) COMPLICATIONS: None immediate. TECHNIQUE: Informed written consent was obtained from the patient after a discussion of the risks, benefits and alternatives to treatment. Questions regarding the procedure were encouraged and answered. A timeout was performed prior to the initiation of the procedure. Ultrasound scanning of the right lower abdominal quadrant demonstrated a moderate amount of intra-abdominal ascites. The skin overlying the lateral aspect of the right mid/lower abdomen was prepped and draped in usual sterile fashion. The overlying skin was anesthetized with 1% lidocaine. This allowed for placement of a Safe-T-Centesis catheter. The paracentesis was performed yielding 2700 cc of dark yellow serous ascitic fluid. The Safe-T-Centesis catheter was removed intact and a dressing  was placed. Attention was now paid towards the transjugular  liver biopsy. The right neck was prepped and draped in the usual sterile fashion, and a sterile drape was applied covering the operative field. Maximum barrier sterile technique with sterile gowns and gloves were used for the procedure. A timeout was performed prior to the initiation of the procedure. Local anesthesia was provided with 1% lidocaine with epinephrine. The right internal jugular vein was accessed with a micropuncture kit under direct ultrasound guidance. An ultrasound image was saved for documentation purposes. The micropuncture sheath was exchanged for a 9-French vascular sheath over a Benson wire. A 100 cm Bernstein catheter was advanced over a stiff Glidewire to select the middle hepatic vein. A hepatic venogram was performed. Free hepatic and right atrial pressures were obtained with the Berenstein catheter. The Berenstein catheter was exchanged for a Dextera transjugular biopsy sheath. 3 core needle biopsies were obtained and anterior to the middle hepatic vein under direct fluoroscopic guidance. Samples were deemed adequate, placed in formalin and submitted to pathology. At this point the procedure was terminated. All wires, catheters and sheaths removed and the patient. Hemostasis was achieved at the right neck access site with manual compression. A dressing was placed. The patient tolerated the procedure well without immediate postprocedural complication. FINDINGS: Normal hepatic venogram.  Pressure measurements as follows: Free hepatic mean pressure - 12 mmHg Wedged hepatic mean pressure - 29 mmHg Gradient 17 Successful acquisition of 3 core needle biopsies anterior to the middle hepatic vein. IMPRESSION: 1. Portosystemic gradient of 17 compatible with portal venous hypertension. 2. Successful fluoroscopic guided transjugular liver biopsy. 3. Normal hepatic venogram. PLAN: Unfortunately current MELD score of 21 suggests patient would be a poor candidate for potential TIPS creation for  recurrent symptomatic ascites. Would recommend continued medical optimization and if MELD score were to improve (ideally < 14) , the patient could be referred to the Interventional Radiology Clinic [phone]) for formal TIPS consultation as indicated. Electronically Signed   By: Sandi Mariscal M.D.   On: 02/02/2017 15:09   Ir Transcatheter Bx  Result Date: 02/02/2017 INDICATION: History of alcoholic cirrhosis and ascites. Please perform ultrasound-guided paracentesis and transjugular liver biopsy with acquisition of pressure measurements for diagnostic and therapeutic purposes. EXAM: 1. FLUOROSCOPIC GUIDED TRANSJUGULAR LIVER BIOPSY WITH ACQUISITION OF PRESSURE MEASUREMENTS 2. ULTRASOUND GUIDANCE FOR VENOUS ACCESS 3. ULTRASOUND-GUIDED PARACENTESIS COMPARISON:  CT abdomen pelvis - 12/23/2016; ultrasound-guided paracentesis - 01/31/2017; 01/18/2017 MEDICATIONS: None CONTRAST:  20 cc Isovue-300 ANESTHESIA/SEDATION: Moderate (conscious) sedation was employed during this procedure. A total of Versed 3 mg and Fentanyl 75 mcg was administered intravenously. Moderate Sedation Time: 35 minutes. The patient's level of consciousness and vital signs were monitored continuously by radiology nursing throughout the procedure under my direct supervision. FLUOROSCOPY TIME:  5 minutes 48 seconds (46 mGy) COMPLICATIONS: None immediate. TECHNIQUE: Informed written consent was obtained from the patient after a discussion of the risks, benefits and alternatives to treatment. Questions regarding the procedure were encouraged and answered. A timeout was performed prior to the initiation of the procedure. Ultrasound scanning of the right lower abdominal quadrant demonstrated a moderate amount of intra-abdominal ascites. The skin overlying the lateral aspect of the right mid/lower abdomen was prepped and draped in usual sterile fashion. The overlying skin was anesthetized with 1% lidocaine. This allowed for placement of a Safe-T-Centesis  catheter. The paracentesis was performed yielding 2700 cc of dark yellow serous ascitic fluid. The Safe-T-Centesis catheter was removed intact and a dressing was placed. Attention was  now paid towards the transjugular liver biopsy. The right neck was prepped and draped in the usual sterile fashion, and a sterile drape was applied covering the operative field. Maximum barrier sterile technique with sterile gowns and gloves were used for the procedure. A timeout was performed prior to the initiation of the procedure. Local anesthesia was provided with 1% lidocaine with epinephrine. The right internal jugular vein was accessed with a micropuncture kit under direct ultrasound guidance. An ultrasound image was saved for documentation purposes. The micropuncture sheath was exchanged for a 9-French vascular sheath over a Benson wire. A 100 cm Bernstein catheter was advanced over a stiff Glidewire to select the middle hepatic vein. A hepatic venogram was performed. Free hepatic and right atrial pressures were obtained with the Berenstein catheter. The Berenstein catheter was exchanged for a Dextera transjugular biopsy sheath. 3 core needle biopsies were obtained and anterior to the middle hepatic vein under direct fluoroscopic guidance. Samples were deemed adequate, placed in formalin and submitted to pathology. At this point the procedure was terminated. All wires, catheters and sheaths removed and the patient. Hemostasis was achieved at the right neck access site with manual compression. A dressing was placed. The patient tolerated the procedure well without immediate postprocedural complication. FINDINGS: Normal hepatic venogram.  Pressure measurements as follows: Free hepatic mean pressure - 12 mmHg Wedged hepatic mean pressure - 29 mmHg Gradient 17 Successful acquisition of 3 core needle biopsies anterior to the middle hepatic vein. IMPRESSION: 1. Portosystemic gradient of 17 compatible with portal venous hypertension.  2. Successful fluoroscopic guided transjugular liver biopsy. 3. Normal hepatic venogram. PLAN: Unfortunately current MELD score of 21 suggests patient would be a poor candidate for potential TIPS creation for recurrent symptomatic ascites. Would recommend continued medical optimization and if MELD score were to improve (ideally < 14) , the patient could be referred to the Interventional Radiology Clinic [phone]) for formal TIPS consultation as indicated. Electronically Signed   By: [name]  Watts M.D.   On: 02/02/2017 15:09   Ir Us Guide Vasc Access Right  Result Date: 02/02/2017 INDICATION: History of alcoholic cirrhosis and ascites. Please perform ultrasound-guided paracentesis and transjugular liver biopsy with acquisition of pressure measurements for diagnostic and therapeutic purposes. EXAM: 1. FLUOROSCOPIC GUIDED TRANSJUGULAR LIVER BIOPSY WITH ACQUISITION OF PRESSURE MEASUREMENTS 2. ULTRASOUND GUIDANCE FOR VENOUS ACCESS 3. ULTRASOUND-GUIDED PARACENTESIS COMPARISON:  CT abdomen pelvis - 12/23/2016; ultrasound-guided paracentesis - 01/31/2017; 01/18/2017 MEDICATIONS: None CONTRAST:  20 cc Isovue-300 ANESTHESIA/SEDATION: Moderate (conscious) sedation was employed during this procedure. A total of Versed 3 mg and Fentanyl 75 mcg was administered intravenously. Moderate Sedation Time: 35 minutes. The patient's level of consciousness and vital signs were monitored continuously by radiology nursing throughout the procedure under my direct supervision. FLUOROSCOPY TIME:  5 minutes 48 seconds (46 mGy) COMPLICATIONS: None immediate. TECHNIQUE: Informed written consent was obtained from the patient after a discussion of the risks, benefits and alternatives to treatment. Questions regarding the procedure were encouraged and answered. A timeout was performed prior to the initiation of the procedure. Ultrasound scanning of the right lower abdominal quadrant demonstrated a moderate amount of intra-abdominal ascites. The  skin overlying the lateral aspect of the right mid/lower abdomen was prepped and draped in usual sterile fashion. The overlying skin was anesthetized with 1% lidocaine. This allowed for placement of a Safe-T-Centesis catheter. The paracentesis was performed yielding 2700 cc of dark yellow serous ascitic fluid. The Safe-T-Centesis catheter was removed intact and a dressing was placed. Attention was now paid   towards the transjugular liver biopsy. The right neck was prepped and draped in the usual sterile fashion, and a sterile drape was applied covering the operative field. Maximum barrier sterile technique with sterile gowns and gloves were used for the procedure. A timeout was performed prior to the initiation of the procedure. Local anesthesia was provided with 1% lidocaine with epinephrine. The right internal jugular vein was accessed with a micropuncture kit under direct ultrasound guidance. An ultrasound image was saved for documentation purposes. The micropuncture sheath was exchanged for a 9-French vascular sheath over a Benson wire. A 100 cm Bernstein catheter was advanced over a stiff Glidewire to select the middle hepatic vein. A hepatic venogram was performed. Free hepatic and right atrial pressures were obtained with the Berenstein catheter. The Berenstein catheter was exchanged for a Dextera transjugular biopsy sheath. 3 core needle biopsies were obtained and anterior to the middle hepatic vein under direct fluoroscopic guidance. Samples were deemed adequate, placed in formalin and submitted to pathology. At this point the procedure was terminated. All wires, catheters and sheaths removed and the patient. Hemostasis was achieved at the right neck access site with manual compression. A dressing was placed. The patient tolerated the procedure well without immediate postprocedural complication. FINDINGS: Normal hepatic venogram.  Pressure measurements as follows: Free hepatic mean pressure - 12 mmHg Wedged  hepatic mean pressure - 29 mmHg Gradient 17 Successful acquisition of 3 core needle biopsies anterior to the middle hepatic vein. IMPRESSION: 1. Portosystemic gradient of 17 compatible with portal venous hypertension. 2. Successful fluoroscopic guided transjugular liver biopsy. 3. Normal hepatic venogram. PLAN: Unfortunately current MELD score of 21 suggests patient would be a poor candidate for potential TIPS creation for recurrent symptomatic ascites. Would recommend continued medical optimization and if MELD score were to improve (ideally < 14) , the patient could be referred to the Interventional Radiology Clinic [phone]) for formal TIPS consultation as indicated. Electronically Signed   By: Sandi Mariscal M.D.   On: 02/02/2017 15:09   Dg Chest Port 1 View  Result Date: 02/03/2017 CLINICAL DATA:  Leukocytosis EXAM: PORTABLE CHEST 1 VIEW COMPARISON:  01/31/2017 FINDINGS: Normal heart size and mediastinal contours. No acute infiltrate or edema. No effusion or pneumothorax. No acute osseous findings. IMPRESSION: Negative portable chest. Electronically Signed   By: Monte Fantasia M.D.   On: 02/03/2017 09:41   Ir Paracentesis  Result Date: 02/02/2017 INDICATION: History of alcoholic cirrhosis and ascites. Please perform ultrasound-guided paracentesis and transjugular liver biopsy with acquisition of pressure measurements for diagnostic and therapeutic purposes. EXAM: 1. FLUOROSCOPIC GUIDED TRANSJUGULAR LIVER BIOPSY WITH ACQUISITION OF PRESSURE MEASUREMENTS 2. ULTRASOUND GUIDANCE FOR VENOUS ACCESS 3. ULTRASOUND-GUIDED PARACENTESIS COMPARISON:  CT abdomen pelvis - 12/23/2016; ultrasound-guided paracentesis - 01/31/2017; 01/18/2017 MEDICATIONS: None CONTRAST:  20 cc Isovue-300 ANESTHESIA/SEDATION: Moderate (conscious) sedation was employed during this procedure. A total of Versed 3 mg and Fentanyl 75 mcg was administered intravenously. Moderate Sedation Time: 35 minutes. The patient's level of consciousness and  vital signs were monitored continuously by radiology nursing throughout the procedure under my direct supervision. FLUOROSCOPY TIME:  5 minutes 48 seconds (46 mGy) COMPLICATIONS: None immediate. TECHNIQUE: Informed written consent was obtained from the patient after a discussion of the risks, benefits and alternatives to treatment. Questions regarding the procedure were encouraged and answered. A timeout was performed prior to the initiation of the procedure. Ultrasound scanning of the right lower abdominal quadrant demonstrated a moderate amount of intra-abdominal ascites. The skin overlying the lateral aspect of the  right mid/lower abdomen was prepped and draped in usual sterile fashion. The overlying skin was anesthetized with 1% lidocaine. This allowed for placement of a Safe-T-Centesis catheter. The paracentesis was performed yielding 2700 cc of dark yellow serous ascitic fluid. The Safe-T-Centesis catheter was removed intact and a dressing was placed. Attention was now paid towards the transjugular liver biopsy. The right neck was prepped and draped in the usual sterile fashion, and a sterile drape was applied covering the operative field. Maximum barrier sterile technique with sterile gowns and gloves were used for the procedure. A timeout was performed prior to the initiation of the procedure. Local anesthesia was provided with 1% lidocaine with epinephrine. The right internal jugular vein was accessed with a micropuncture kit under direct ultrasound guidance. An ultrasound image was saved for documentation purposes. The micropuncture sheath was exchanged for a 9-French vascular sheath over a Benson wire. A 100 cm Bernstein catheter was advanced over a stiff Glidewire to select the middle hepatic vein. A hepatic venogram was performed. Free hepatic and right atrial pressures were obtained with the Berenstein catheter. The Berenstein catheter was exchanged for a Dextera transjugular biopsy sheath. 3 core  needle biopsies were obtained and anterior to the middle hepatic vein under direct fluoroscopic guidance. Samples were deemed adequate, placed in formalin and submitted to pathology. At this point the procedure was terminated. All wires, catheters and sheaths removed and the patient. Hemostasis was achieved at the right neck access site with manual compression. A dressing was placed. The patient tolerated the procedure well without immediate postprocedural complication. FINDINGS: Normal hepatic venogram.  Pressure measurements as follows: Free hepatic mean pressure - 12 mmHg Wedged hepatic mean pressure - 29 mmHg Gradient 17 Successful acquisition of 3 core needle biopsies anterior to the middle hepatic vein. IMPRESSION: 1. Portosystemic gradient of 17 compatible with portal venous hypertension. 2. Successful fluoroscopic guided transjugular liver biopsy. 3. Normal hepatic venogram. PLAN: Unfortunately current MELD score of 21 suggests patient would be a poor candidate for potential TIPS creation for recurrent symptomatic ascites. Would recommend continued medical optimization and if MELD score were to improve (ideally < 14) , the patient could be referred to the Interventional Radiology Clinic [phone]) for formal TIPS consultation as indicated. Electronically Signed   By: Sandi Mariscal M.D.   On: 02/02/2017 15:09     Scheduled Meds: . docusate sodium  100 mg Oral BID  . feeding supplement (ENSURE ENLIVE)  237 mL Oral TID BM  . feeding supplement (PRO-STAT SUGAR FREE 64)  30 mL Oral BID  . folic acid  1 mg Oral Daily  . furosemide  60 mg Oral Daily  . lactulose  20 g Oral BID  . multivitamin with minerals  1 tablet Oral Daily  . pantoprazole  40 mg Oral BID  . prednisoLONE  40 mg Oral QAC breakfast  . psyllium  1 packet Oral Daily  . spironolactone  150 mg Oral Daily  . thiamine  100 mg Oral Daily   Continuous Infusions:    LOS: 4 days   Time Spent in minutes   35 minutes  Kainalu Heggs  D.O. on 02/04/2017 at 11:35 AM  Between 7am to 7pm - Pager - [phone]  After 7pm go to [email or web address] - password [email or web address]  And look for the night coverage person covering for me after hours  [facility] Office  [phone]

## 2017-02-04 NOTE — Progress Notes (Signed)
Occupational Therapy Treatment Patient Details Name: Mercedes Mitchell MRN: [id number] DOB: [birth year] Today's Date: 02/04/2017    History of present illness Mercedes Mitchell is a 56 y.o. female with medical history significant of alcoholic cirrhosis with ascites, anemia, and malnutrition presenting with abdominal pain.  She has stage 2 pressure ulcer, not sleeping, hard to get up and down due to ascites, and recent fall. Pt s/p liver biopsy and paracentesis.   OT comments  Educated on shower transfer today; pt will need close supervision/min guard to safely perform this.  She will benefit from level one theraband exercises. Will provide program today  Follow Up Recommendations  Home health OT    Equipment Recommendations  3 in 1 bedside commode    Recommendations for Other Services      Precautions / Restrictions Precautions Precautions: Fall Restrictions Weight Bearing Restrictions: No       Mobility Bed Mobility Overal bed mobility: Needs Assistance Bed Mobility: Supine to Sit     Supine to sit: Min assist     General bed mobility comments: for trunk  Transfers Overall transfer level: Needs assistance Equipment used: Rolling walker (2 wheeled)   Sit to Stand: Min guard         General transfer comment: for safety. Cues for UE placement    Balance             Standing balance-Leahy Scale: Poor                             ADL either performed or assessed with clinical judgement   ADL                                   Tub/ Shower Transfer: Walk-in shower;Ambulation;Rolling walker;Min guard     General ADL Comments: practiced shower transfer.  Educated pt that she should wash at sink if her husband isn't present to assist her.  She was steadier backing into shower with use of RW.  Pt does need cues for safety with RW as this is new to her.  Pt sometimes has difficulty getting up from bed.  Will bring her theraband and HEP  today to work on over the weekend.  Pt continues to have general weakness and was shaky when walking to bathroom.       Vision       Perception     Praxis      Cognition Arousal/Alertness: Awake/alert Behavior During Therapy: WFL for tasks assessed/performed Overall Cognitive Status: Within Functional Limits for tasks assessed                                          Exercises     Shoulder Instructions       General Comments      Pertinent Vitals/ Pain       Pain Assessment: Faces Faces Pain Scale: Hurts a little bit Pain Location: buttocks, back Pain Descriptors / Indicators: Tender;Sore Pain Intervention(s): Limited activity within patient's tolerance;Monitored during session;Premedicated before session;Repositioned  Home Living  Prior Functioning/Environment              Frequency  Min 2X/week        Progress Toward Goals  OT Goals(current goals can now be found in the care plan section)  Progress towards OT goals: Progressing toward goals  Acute Rehab OT Goals Time For Goal Achievement: 02/10/17  Plan Discharge plan remains appropriate    Co-evaluation                 AM-PAC PT "6 Clicks" Daily Activity     Outcome Measure   Help from another person eating meals?: None Help from another person taking care of personal grooming?: A Little Help from another person toileting, which includes using toliet, bedpan, or urinal?: A Little Help from another person bathing (including washing, rinsing, drying)?: A Little Help from another person to put on and taking off regular upper body clothing?: A Little Help from another person to put on and taking off regular lower body clothing?: A Lot 6 Click Score: 18    End of Session    OT Visit Diagnosis: Unsteadiness on feet (R26.81);Muscle weakness (generalized) (M62.81)   Activity Tolerance Patient limited by fatigue    Patient Left in bed;with call bell/phone within reach;with bed alarm set   Nurse Communication          Time: 2536-6440 OT Time Calculation (min): 18 min  Charges: OT General Charges $OT Visit: 1 Procedure OT Treatments $Self Care/Home Management : 8-22 mins  Lesle Chris, OTR/L [phone] 02/04/2017   [place] 02/04/2017, 11:45 AM

## 2017-02-04 NOTE — Progress Notes (Signed)
     [facility] Progress Note  Chief Complaint:    cirrhosis  Subjective: Feels okay today. Inquiring about long term plans regarding cirrhosis  Objective:  Vital signs in last 24 hours: Temp:  [98.4 F (36.9 C)-98.5 F (36.9 C)] 98.5 F (36.9 C) (06/15 0506) Pulse Rate:  [91-109] 96 (06/15 0506) Resp:  [16] 16 (06/15 0506) BP: (99-100)/(62-70) 100/67 (06/15 0506) SpO2:  [98 %-99 %] 98 % (06/15 0506) Weight:  [101 lb 6.6 oz (46 kg)] 101 lb 6.6 oz (46 kg) (06/15 0506) Last BM Date: 02/01/17 General:   Alert, emaciated white female in NAD EENT:  Normal hearing, non icteric sclera, conjunctive pink.  Heart:  Regular rate and rhythm; no lower extremity edema Pulm: Normal respiratory effort, lungs CTA bilaterally without wheezes or crackles. Abdomen:  Soft, mildly distended, nontender.  Normal bowel sounds, no masses felt. Neurologic:  Alert and  oriented x4;  grossly normal neurologically.No asterixis Psych:  Pleasant, cooperative.  Normal mood and affect.  Lab Results:  Recent Labs  02/02/17 0815 02/03/17 0759 02/04/17 0554  WBC 36.3* 45.5* 39.1*  HGB 10.3* 11.5* 9.4*  HCT 29.5* 33.2* 26.4*  PLT 184 182 158   BMET  Recent Labs  02/02/17 0815 02/03/17 0759 02/04/17 0554  NA 134* 131* 131*  K 4.4 5.1 4.0  CL 101 97* 96*  CO2 25 25 26   GLUCOSE 88 128* 112*  BUN 25* 26* 25*  CREATININE 0.44 0.48 0.44  CALCIUM 8.6* 8.8* 8.3*   LFT  Recent Labs  02/04/17 0554  PROT 4.6*  ALBUMIN 2.3*  AST 57*  ALT 69*  ALKPHOS 491*  BILITOT 9.4*   PT/INR  Recent Labs  02/03/17 0759 02/04/17 0554  LABPROT 20.2* 20.4*  INR 1.71 1.72    IASSESSMENT / PLAN:   1. Decompensated ETOH cirrhosis wiht superimposed ETOH hepatitis. LFTs bumped post liver biopsy yesterday but improved today. INR stable.  -With LVP and diuretics her weight has come down ~ 10-12 pounds over last several days without compromise of renal function. Continue daily weights, I+0, 2  gram sodium diet. Hold diuretics at 80 /150 mg for now. She has had total resolution of edema and ascites is significantly better.  -no varices on EGD early May -no liver lesions on recent CTscan.  -she has a follow up early next week at the liver clinic -maybe home in am ? -reiterated importance of continuing outpatient AA  -continue prednisolone -will reduce lactulose back to BID.   2. Protein calorie malnutrition. Still not eating much.  -continue nutritional supplements    LOS: 4 days   Tye Savoy ,NP 02/04/2017, 9:38 AM  Pager number [phone]     [facility] Attending   I have taken an interval history, reviewed the chart and examined the patient. I agree with the Advanced Practitioner's note, impression and recommendations.    Overall she is improved and I think ready for discharge. Continue current medications as previously laid out. She has follow-up with [facility] liver care Tuesday, June 19.  We are signing off. If you have any questions call Dr. Benson Norway over the weekend but hopefully she'll go home tomorrow.  Gatha Mayer, MD, [facility] [phone] (pager) [phone] after 5 PM, weekends and holidays  02/04/2017 7:03 PM

## 2017-02-04 NOTE — Progress Notes (Signed)
Date:  February 04, 2017  Chart reviewed for concurrent status and case management needs.  Will continue to follow patient progress.  Abdominal pain/worsening abdominal distention/acute alcoholic hepatitis with liver cirrhosis -Gastroneurology consulted and appreciated -Interventional radiology consult and appreciated -Status post US paracentesis with 5.5 L removed on 01/31/2017 -s/p US paracentesis with 2.4L removed on 02/02/2017 and Fluoro guided transjugular hepatic biopsy  -LFTs currently stable -Continue lasix and spironolactone -pending biopsy results -monitor intake/output, daily weights Discharge Planning: following for needs  Expected discharge date: [id number]  Velva Harman, BSN, [place], [place]

## 2017-02-05 DIAGNOSIS — F329 Major depressive disorder, single episode, unspecified: Secondary | ICD-10-CM

## 2017-02-05 LAB — CULTURE, BODY FLUID W GRAM STAIN -BOTTLE

## 2017-02-05 LAB — CULTURE, BLOOD (ROUTINE X 2)
CULTURE: NO GROWTH
CULTURE: NO GROWTH
Special Requests: ADEQUATE
Special Requests: ADEQUATE

## 2017-02-05 LAB — CBC
HCT: 27.9 % — ABNORMAL LOW (ref 36.0–46.0)
Hemoglobin: 10 g/dL — ABNORMAL LOW (ref 12.0–15.0)
MCH: 36.6 pg — ABNORMAL HIGH (ref 26.0–34.0)
MCHC: 35.8 g/dL (ref 30.0–36.0)
MCV: 102.2 fL — ABNORMAL HIGH (ref 78.0–100.0)
PLATELETS: 165 10*3/uL (ref 150–400)
RBC: 2.73 MIL/uL — ABNORMAL LOW (ref 3.87–5.11)
RDW: 15.4 % (ref 11.5–15.5)
WBC: 40.5 10*3/uL — AB (ref 4.0–10.5)

## 2017-02-05 LAB — COMPREHENSIVE METABOLIC PANEL
ALBUMIN: 2.2 g/dL — AB (ref 3.5–5.0)
ALK PHOS: 561 U/L — AB (ref 38–126)
ALT: 64 U/L — AB (ref 14–54)
AST: 59 U/L — ABNORMAL HIGH (ref 15–41)
Anion gap: 10 (ref 5–15)
BILIRUBIN TOTAL: 7.8 mg/dL — AB (ref 0.3–1.2)
BUN: 25 mg/dL — AB (ref 6–20)
CO2: 25 mmol/L (ref 22–32)
CREATININE: 0.43 mg/dL — AB (ref 0.44–1.00)
Calcium: 8.4 mg/dL — ABNORMAL LOW (ref 8.9–10.3)
Chloride: 95 mmol/L — ABNORMAL LOW (ref 101–111)
GFR calc Af Amer: >60 mL/min (ref >60)
GLUCOSE: 119 mg/dL — AB (ref 65–99)
Potassium: 4.6 mmol/L (ref 3.5–5.1)
Sodium: 130 mmol/L — ABNORMAL LOW (ref 135–145)
TOTAL PROTEIN: 4.7 g/dL — AB (ref 6.5–8.1)

## 2017-02-05 LAB — CULTURE, BODY FLUID-BOTTLE: CULTURE: NO GROWTH

## 2017-02-05 LAB — T4, FREE: FREE T4: 0.66 ng/dL (ref 0.61–1.12)

## 2017-02-05 LAB — MAGNESIUM: Magnesium: 1.9 mg/dL (ref 1.7–2.4)

## 2017-02-05 MED ORDER — SPIRONOLACTONE 50 MG PO TABS: 150.0000 mg | ORAL_TABLET | Freq: Every day | ORAL | 0 refills | Status: DC

## 2017-02-05 MED ORDER — CAMPHOR-MENTHOL 0.5-0.5 % EX LOTN: TOPICAL_LOTION | CUTANEOUS | 0 refills | Status: DC | PRN

## 2017-02-05 MED ORDER — DOCUSATE SODIUM 100 MG PO CAPS: 100.0000 mg | ORAL_CAPSULE | Freq: Two times a day (BID) | ORAL | 0 refills | Status: DC

## 2017-02-05 MED ORDER — LACTULOSE 10 GM/15ML PO SOLN: 20.0000 g | Freq: Two times a day (BID) | ORAL | 0 refills | Status: DC

## 2017-02-05 MED ORDER — FUROSEMIDE 20 MG PO TABS: 60.0000 mg | ORAL_TABLET | Freq: Every day | ORAL | 0 refills | Status: DC

## 2017-02-05 MED ORDER — PREDNISOLONE 15 MG/5ML PO SOLN: 40.0000 mg | Freq: Every day | ORAL | 0 refills | Status: DC

## 2017-02-05 NOTE — Discharge Summary (Signed)
Physician Discharge Summary  Mercedes Mitchell JIR:[id number] DOB: 1961-07-26 DOA: 01/30/2017  PCP: Martinique, Betty G, MD  Admit date: 01/30/2017 Discharge date: 02/05/2017  Time spent: 45 minutes  Recommendations for Outpatient Follow-up:  Patient will be discharged to home with home health services.  Patient will need to follow up with primary care provider within one week of discharge, repeat CBC and CMP, thyroid studies, and discuss referral to psychiatry and therapist.  Follow up with gastroenterology/liver specialist. Patient should continue medications as prescribed.  Patient should follow a regular diet.   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 Ramond Marrow Bakeris a 56 y.o.femalewith medical history significant of alcoholic cirrhosis with ascites, anemia, and malnutrition presenting with abdominal pain. She reports that she has a "thing" on her butt - like an abscess, extraordinarily painful (stage 2 pressure ulcer);not sleeping, "I've got all this fluid in my body." Hard toget up and down due to ascites. Swollen lymph nodes in neck "recently." Fluid in abdomen is worsening. She wentto an AA meeting last week and tripped and hit her head about 1 week ago, requiring sutures (recently removed). 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 -Gastroneurology consulted and appreciated -Interventional radiology consult and appreciated -Status post US paracentesis with 5.5 L removed on 01/31/2017 -s/p US paracentesis with 2.4L removed on 02/02/2017 and Fluoro guided transjugular hepatic biopsy  -LFTs currently stable trending downward from 6/14 -Continue lasix and spironolactone, doses increased by GI -Biopsy results: Cirrhosis with acute hepatitis and cholestasis, increased iron -Follow-up as an outpatient with gastroenterology, liver specialist on Tuesday, 02/08/2017  Severe protein calorie malnutrition -Nutrition consulted and appreciated, continue supplements  Macrocytic anemia -Baseline hemoglobin 11 -Hemoglobin currently stable 10 -Continue to monitor CBC  Leukocytosis -Likely secondary to prednisone -Chest x-ray and UA unremarkable for infection on 6/11 -Oncology/hematology consulted and appreciated, recommended treating underlying illness -Fluid labs do not indicate SBP -Patient currently afebrile, with no signs of infection -Obtained repeat UA and CXR given increasing WBC, 45.5, both were unremarkable for infeciton -WBC trending downward today, 40.5  History of severe Candida esophagitis -Noted on EGD done in April 2018 -Diflucan course completed and discontinued by GI  Pressure ulcer -Wound care consulted and appreciated -on buttock -Clean, no erythema or drainage -Continue air mattress  Possible depression -Likely secondary to the above issues -Psychiatry consulted and appreciated. Patient refused psych meds -Patient very tearful this morning -On 02/04/2017, patient requested repeat psych consult  -Discussed with Dr. Tiburcio Pea (on 6/16) who stated that patient had been cleared by psychiatry, will not repeat consult as psychiatry does not offer inpatient therapy or medications.  Chronic hyponatremia -Sodium appears to be  stable  Sepsis ruled out -Suspected on admission, no source found  Acute encephalopathy, hepatic -Ammonia level slightly elevated 83 on 6/14, lactulose increased to TID -Patient currently AAOx3  Hypomagnesemia -Replaced with IV form, stable  Sinus tachycardia -Possibly due to anxiety? As patient is waiting  on her biopsy results -TSH 0.316  -potassium 4 -Magnesium 1.9 today  Abnormal TSH -TSh 0.316 (sligly low), FT4 pending and may be followed by outpatient physician  Physical deconditioning -PT and OT consulted, recommending home health, 3 and 1, bedside commode   Consultants Gastroenterology Oncology/Hematology Interventional radiology Psychiatry   Procedures  US guided paracentesis x2 Fluoroscopy guided transjugular hepatic biopsy  Discharge Exam: Vitals:   02/04/17 2158 02/05/17 0558  BP: 111/75 110/76  Pulse: 97 98  Resp: 17 18  Temp: 97.8 F (36.6 C) 97.8 F (36.6 C)   Patient states she is feeling better. Still has episodes of anxiety regarding her diagnosis. Denies any current chest pain or shortness of breath, dizziness, headache. Feels her abdominal pain has improved. Patient feels she is ready to go home.   General: Well developed, cachectic, chronically ill appearing, no apparent distress  HEENT: NCAT, scleral icterus, mucous membranes moist. Mid forehead abrasion, healing  Cardiovascular: S1 S2 auscultated, RRR, no murmurs  Respiratory: Clear to auscultation bilaterally with equal chest rise  Abdomen: Soft, nontender, nondistended, + bowel sounds  Extremities: warm dry without cyanosis clubbing. LE edema B/L   Neuro: AAOx3, nonfocal  Psych: appropriate mood and affect  Discharge Instructions Discharge Instructions    Discharge instructions    Complete by:  As directed    Patient will be discharged to home with home health services.  Patient will need to follow up with primary care provider within one week of discharge, repeat CBC  and CMP, thyroid studies, and discuss referral to psychiatry and therapist.  Follow up with gastroenterology/liver specialist. Patient should continue medications as prescribed.  Patient should follow a regular diet     Current Discharge Medication List    START taking these medications   Details  camphor-menthol (SARNA) lotion Apply topically as needed for itching. Qty: 222 mL, Refills: 0    docusate sodium (COLACE) 100 MG capsule Take 1 capsule (100 mg total) by mouth 2 (two) times daily. Qty: 10 capsule, Refills: 0      CONTINUE these medications which have CHANGED   Details  furosemide (LASIX) 20 MG tablet Take 3 tablets (60 mg total) by mouth daily. Qty: 90 tablet, Refills: 0    lactulose (CHRONULAC) 10 GM/15ML solution Take 30 mLs (20 g total) by mouth 2 (two) times daily. Qty: 240 mL, Refills: 0    prednisoLONE (PRELONE) 15 MG/5ML SOLN Take 13.3 mLs (40 mg total) by mouth daily before breakfast. Continue until tapered by GI. Qty: 450 mL, Refills: 0    spironolactone (ALDACTONE) 50 MG tablet Take 3 tablets (150 mg total) by mouth daily. Qty: 90 tablet, Refills: 0      CONTINUE these medications which have NOT CHANGED   Details  bacitracin ointment Apply 1 application topically 3 (three) times daily. Qty: 15 g, Refills: 1    feeding supplement, ENSURE ENLIVE, (ENSURE ENLIVE) LIQD Take 237 mLs by mouth 3 (three) times daily between meals. Qty: 90 Bottle, Refills: 0    folic acid (FOLVITE) 1 MG tablet Take 1 tablet (1 mg total) by mouth daily. Qty: 30 tablet, Refills: 0    Multiple Vitamin (MULTIVITAMIN WITH MINERALS) TABS tablet Take 1 tablet by mouth daily. Qty: 30 tablet, Refills: 0    pantoprazole (PROTONIX) 40 MG tablet Take 1 tablet (40 mg total) by mouth 2 (two) times daily. Qty: 60 tablet, Refills: 0    psyllium (HYDROCIL/METAMUCIL) 95 % PACK Take 1 packet by mouth daily. Qty: 30 each,  Refills: 0    traZODone (DESYREL) 50 MG tablet Take 1/2 tablet HS to  start Increase to 1-2 tablets HS as needed for sleep Qty: 60 tablet, Refills: 0    thiamine 100 MG tablet Take 1 tablet (100 mg total) by mouth daily. Qty: 30 tablet, Refills: 0      STOP taking these medications     fluconazole (DIFLUCAN) 200 MG tablet        No Known Allergies Follow-up Information    Martinique, Betty G, MD. Schedule an appointment as soon as possible for a visit in 1 week(s).   Specialty:  Family Medicine Why:  Hospital follow up Contact information: [place] [address] [phone]            The results of significant diagnostics from this hospitalization (including imaging, microbiology, ancillary and laboratory) are listed below for reference.    Significant Diagnostic Studies: Ct Head Wo Contrast  Result Date: 01/19/2017 CLINICAL DATA:  Status post fall.  Laceration to the forehead. EXAM: CT HEAD WITHOUT CONTRAST TECHNIQUE: Contiguous axial images were obtained from the base of the skull through the vertex without intravenous contrast. COMPARISON:  None. FINDINGS: Brain: No evidence of acute infarction, hemorrhage, extra-axial collection, ventriculomegaly, or mass effect. Generalized cerebral atrophy. Periventricular white matter low attenuation likely secondary to microangiopathy. Vascular: Cerebrovascular atherosclerotic calcifications are noted. Skull: Negative for fracture or focal lesion. Sinuses/Orbits: Visualized portions of the orbits are unremarkable. Visualized portions of the paranasal sinuses and mastoid air cells are unremarkable. Other: None. IMPRESSION: No acute intracranial pathology. Electronically Signed   By: Kathreen Devoid   On: 01/19/2017 11:48   Ir Venogram Hepatic W Hemodynamic Evaluation  Result Date: 02/02/2017 INDICATION: History of alcoholic cirrhosis and ascites. Please perform ultrasound-guided paracentesis and transjugular liver biopsy with acquisition of pressure measurements for diagnostic and  therapeutic purposes. EXAM: 1. FLUOROSCOPIC GUIDED TRANSJUGULAR LIVER BIOPSY WITH ACQUISITION OF PRESSURE MEASUREMENTS 2. ULTRASOUND GUIDANCE FOR VENOUS ACCESS 3. ULTRASOUND-GUIDED PARACENTESIS COMPARISON:  CT abdomen pelvis - 12/23/2016; ultrasound-guided paracentesis - 01/31/2017; 01/18/2017 MEDICATIONS: None CONTRAST:  20 cc Isovue-300 ANESTHESIA/SEDATION: Moderate (conscious) sedation was employed during this procedure. A total of Versed 3 mg and Fentanyl 75 mcg was administered intravenously. Moderate Sedation Time: 35 minutes. The patient's level of consciousness and vital signs were monitored continuously by radiology nursing throughout the procedure under my direct supervision. FLUOROSCOPY TIME:  5 minutes 48 seconds (46 mGy) COMPLICATIONS: None immediate. TECHNIQUE: Informed written consent was obtained from the patient after a discussion of the risks, benefits and alternatives to treatment. Questions regarding the procedure were encouraged and answered. A timeout was performed prior to the initiation of the procedure. Ultrasound scanning of the right lower abdominal quadrant demonstrated a moderate amount of intra-abdominal ascites. The skin overlying the lateral aspect of the right mid/lower abdomen was prepped and draped in usual sterile fashion. The overlying skin was anesthetized with 1% lidocaine. This allowed for placement of a Safe-T-Centesis catheter. The paracentesis was performed yielding 2700 cc of dark yellow serous ascitic fluid. The Safe-T-Centesis catheter was removed intact and a dressing was placed. Attention was now paid towards the transjugular liver biopsy. The right neck was prepped and draped in the usual sterile fashion, and a sterile drape was applied covering the operative field. Maximum barrier sterile technique with sterile gowns and gloves were used for the procedure. A timeout was performed prior to the initiation of the procedure. Local anesthesia was provided with 1%  lidocaine with epinephrine.  The right internal jugular vein was accessed with a micropuncture kit under direct ultrasound guidance. An ultrasound image was saved for documentation purposes. The micropuncture sheath was exchanged for a 9-French vascular sheath over a Benson wire. A 100 cm Bernstein catheter was advanced over a stiff Glidewire to select the middle hepatic vein. A hepatic venogram was performed. Free hepatic and right atrial pressures were obtained with the Berenstein catheter. The Berenstein catheter was exchanged for a Dextera transjugular biopsy sheath. 3 core needle biopsies were obtained and anterior to the middle hepatic vein under direct fluoroscopic guidance. Samples were deemed adequate, placed in formalin and submitted to pathology. At this point the procedure was terminated. All wires, catheters and sheaths removed and the patient. Hemostasis was achieved at the right neck access site with manual compression. A dressing was placed. The patient tolerated the procedure well without immediate postprocedural complication. FINDINGS: Normal hepatic venogram.  Pressure measurements as follows: Free hepatic mean pressure - 12 mmHg Wedged hepatic mean pressure - 29 mmHg Gradient 17 Successful acquisition of 3 core needle biopsies anterior to the middle hepatic vein. IMPRESSION: 1. Portosystemic gradient of 17 compatible with portal venous hypertension. 2. Successful fluoroscopic guided transjugular liver biopsy. 3. Normal hepatic venogram. PLAN: Unfortunately current MELD score of 21 suggests patient would be a poor candidate for potential TIPS creation for recurrent symptomatic ascites. Would recommend continued medical optimization and if MELD score were to improve (ideally < 14) , the patient could be referred to the Interventional Radiology Clinic [phone]) for formal TIPS consultation as indicated. Electronically Signed   By: Sandi Mariscal M.D.   On: 02/02/2017 15:09   Ir Transcatheter  Bx  Result Date: 02/02/2017 INDICATION: History of alcoholic cirrhosis and ascites. Please perform ultrasound-guided paracentesis and transjugular liver biopsy with acquisition of pressure measurements for diagnostic and therapeutic purposes. EXAM: 1. FLUOROSCOPIC GUIDED TRANSJUGULAR LIVER BIOPSY WITH ACQUISITION OF PRESSURE MEASUREMENTS 2. ULTRASOUND GUIDANCE FOR VENOUS ACCESS 3. ULTRASOUND-GUIDED PARACENTESIS COMPARISON:  CT abdomen pelvis - 12/23/2016; ultrasound-guided paracentesis - 01/31/2017; 01/18/2017 MEDICATIONS: None CONTRAST:  20 cc Isovue-300 ANESTHESIA/SEDATION: Moderate (conscious) sedation was employed during this procedure. A total of Versed 3 mg and Fentanyl 75 mcg was administered intravenously. Moderate Sedation Time: 35 minutes. The patient's level of consciousness and vital signs were monitored continuously by radiology nursing throughout the procedure under my direct supervision. FLUOROSCOPY TIME:  5 minutes 48 seconds (46 mGy) COMPLICATIONS: None immediate. TECHNIQUE: Informed written consent was obtained from the patient after a discussion of the risks, benefits and alternatives to treatment. Questions regarding the procedure were encouraged and answered. A timeout was performed prior to the initiation of the procedure. Ultrasound scanning of the right lower abdominal quadrant demonstrated a moderate amount of intra-abdominal ascites. The skin overlying the lateral aspect of the right mid/lower abdomen was prepped and draped in usual sterile fashion. The overlying skin was anesthetized with 1% lidocaine. This allowed for placement of a Safe-T-Centesis catheter. The paracentesis was performed yielding 2700 cc of dark yellow serous ascitic fluid. The Safe-T-Centesis catheter was removed intact and a dressing was placed. Attention was now paid towards the transjugular liver biopsy. The right neck was prepped and draped in the usual sterile fashion, and a sterile drape was applied covering the  operative field. Maximum barrier sterile technique with sterile gowns and gloves were used for the procedure. A timeout was performed prior to the initiation of the procedure. Local anesthesia was provided with 1% lidocaine with epinephrine. The right internal jugular vein  was accessed with a micropuncture kit under direct ultrasound guidance. An ultrasound image was saved for documentation purposes. The micropuncture sheath was exchanged for a 9-French vascular sheath over a Benson wire. A 100 cm Bernstein catheter was advanced over a stiff Glidewire to select the middle hepatic vein. A hepatic venogram was performed. Free hepatic and right atrial pressures were obtained with the Berenstein catheter. The Berenstein catheter was exchanged for a Dextera transjugular biopsy sheath. 3 core needle biopsies were obtained and anterior to the middle hepatic vein under direct fluoroscopic guidance. Samples were deemed adequate, placed in formalin and submitted to pathology. At this point the procedure was terminated. All wires, catheters and sheaths removed and the patient. Hemostasis was achieved at the right neck access site with manual compression. A dressing was placed. The patient tolerated the procedure well without immediate postprocedural complication. FINDINGS: Normal hepatic venogram.  Pressure measurements as follows: Free hepatic mean pressure - 12 mmHg Wedged hepatic mean pressure - 29 mmHg Gradient 17 Successful acquisition of 3 core needle biopsies anterior to the middle hepatic vein. IMPRESSION: 1. Portosystemic gradient of 17 compatible with portal venous hypertension. 2. Successful fluoroscopic guided transjugular liver biopsy. 3. Normal hepatic venogram. PLAN: Unfortunately current MELD score of 21 suggests patient would be a poor candidate for potential TIPS creation for recurrent symptomatic ascites. Would recommend continued medical optimization and if MELD score were to improve (ideally < 14) , the  patient could be referred to the Interventional Radiology Clinic [phone]) for formal TIPS consultation as indicated. Electronically Signed   By: Sandi Mariscal M.D.   On: 02/02/2017 15:09   US Paracentesis  Result Date: 01/31/2017 INDICATION: Alcoholic hepatitis, portal hypertension, recurrent ascites. Request made for diagnostic and therapeutic paracentesis. EXAM: ULTRASOUND GUIDED DIAGNOSTIC AND THERAPEUTIC PARACENTESIS MEDICATIONS: None. COMPLICATIONS: None immediate. PROCEDURE: Informed written consent was obtained from the patient after a discussion of the risks, benefits and alternatives to treatment. A timeout was performed prior to the initiation of the procedure. Initial ultrasound scanning demonstrates a large amount of ascites within the right lower abdominal quadrant. The right lower abdomen was prepped and draped in the usual sterile fashion. 1% lidocaine was used for local anesthesia. Following this, a Yueh catheter was introduced. An ultrasound image was saved for documentation purposes. The paracentesis was performed. The catheter was removed and a dressing was applied. The patient tolerated the procedure well without immediate post procedural complication. FINDINGS: A total of approximately 5.5 liters of yellow fluid was removed. Samples were sent to the laboratory as requested by the clinical team. IMPRESSION: Successful ultrasound-guided diagnostic and therapeutic paracentesis yielding 5.5 liters of peritoneal fluid. Read by: Rowe Robert, PA-C Electronically Signed   By: Corrie Mckusick D.O.   On: 01/31/2017 14:47   US Paracentesis  Result Date: 01/18/2017 INDICATION: Alcoholic hepatitis, recurrent ascites. Request made for diagnostic and therapeutic paracentesis up to 4 liters. EXAM: ULTRASOUND GUIDED DIAGNOSTIC AND THERAPEUTIC PARACENTESIS MEDICATIONS: None. COMPLICATIONS: None immediate. PROCEDURE: Informed written consent was obtained from the patient after a discussion of the risks,  benefits and alternatives to treatment. A timeout was performed prior to the initiation of the procedure. Initial ultrasound scanning demonstrates a moderate-to-large amount of ascites within the right lower abdominal quadrant. The right lower abdomen was prepped and draped in the usual sterile fashion. 1% lidocaine was used for local anesthesia. Following this, a Yueh catheter was introduced. An ultrasound image was saved for documentation purposes. The paracentesis was performed. The catheter was removed and a  dressing was applied. The patient tolerated the procedure well without immediate post procedural complication. FINDINGS: A total of approximately 4 liters of yellow fluid was removed. Samples were sent to the laboratory as requested by the clinical team. IMPRESSION: Successful ultrasound-guided diagnostic and therapeutic paracentesis yielding 4 liters of peritoneal fluid. Read by: Rowe Robert, PA-C Electronically Signed   By: Jerilynn Mages.  Shick M.D.   On: 01/18/2017 11:17   Ir US Guide Vasc Access Right  Result Date: 02/02/2017 INDICATION: History of alcoholic cirrhosis and ascites. Please perform ultrasound-guided paracentesis and transjugular liver biopsy with acquisition of pressure measurements for diagnostic and therapeutic purposes. EXAM: 1. FLUOROSCOPIC GUIDED TRANSJUGULAR LIVER BIOPSY WITH ACQUISITION OF PRESSURE MEASUREMENTS 2. ULTRASOUND GUIDANCE FOR VENOUS ACCESS 3. ULTRASOUND-GUIDED PARACENTESIS COMPARISON:  CT abdomen pelvis - 12/23/2016; ultrasound-guided paracentesis - 01/31/2017; 01/18/2017 MEDICATIONS: None CONTRAST:  20 cc Isovue-300 ANESTHESIA/SEDATION: Moderate (conscious) sedation was employed during this procedure. A total of Versed 3 mg and Fentanyl 75 mcg was administered intravenously. Moderate Sedation Time: 35 minutes. The patient's level of consciousness and vital signs were monitored continuously by radiology nursing throughout the procedure under my direct supervision. FLUOROSCOPY  TIME:  5 minutes 48 seconds (46 mGy) COMPLICATIONS: None immediate. TECHNIQUE: Informed written consent was obtained from the patient after a discussion of the risks, benefits and alternatives to treatment. Questions regarding the procedure were encouraged and answered. A timeout was performed prior to the initiation of the procedure. Ultrasound scanning of the right lower abdominal quadrant demonstrated a moderate amount of intra-abdominal ascites. The skin overlying the lateral aspect of the right mid/lower abdomen was prepped and draped in usual sterile fashion. The overlying skin was anesthetized with 1% lidocaine. This allowed for placement of a Safe-T-Centesis catheter. The paracentesis was performed yielding 2700 cc of dark yellow serous ascitic fluid. The Safe-T-Centesis catheter was removed intact and a dressing was placed. Attention was now paid towards the transjugular liver biopsy. The right neck was prepped and draped in the usual sterile fashion, and a sterile drape was applied covering the operative field. Maximum barrier sterile technique with sterile gowns and gloves were used for the procedure. A timeout was performed prior to the initiation of the procedure. Local anesthesia was provided with 1% lidocaine with epinephrine. The right internal jugular vein was accessed with a micropuncture kit under direct ultrasound guidance. An ultrasound image was saved for documentation purposes. The micropuncture sheath was exchanged for a 9-French vascular sheath over a Benson wire. A 100 cm Bernstein catheter was advanced over a stiff Glidewire to select the middle hepatic vein. A hepatic venogram was performed. Free hepatic and right atrial pressures were obtained with the Berenstein catheter. The Berenstein catheter was exchanged for a Dextera transjugular biopsy sheath. 3 core needle biopsies were obtained and anterior to the middle hepatic vein under direct fluoroscopic guidance. Samples were deemed  adequate, placed in formalin and submitted to pathology. At this point the procedure was terminated. All wires, catheters and sheaths removed and the patient. Hemostasis was achieved at the right neck access site with manual compression. A dressing was placed. The patient tolerated the procedure well without immediate postprocedural complication. FINDINGS: Normal hepatic venogram.  Pressure measurements as follows: Free hepatic mean pressure - 12 mmHg Wedged hepatic mean pressure - 29 mmHg Gradient 17 Successful acquisition of 3 core needle biopsies anterior to the middle hepatic vein. IMPRESSION: 1. Portosystemic gradient of 17 compatible with portal venous hypertension. 2. Successful fluoroscopic guided transjugular liver biopsy. 3. Normal hepatic venogram. PLAN:  Unfortunately current MELD score of 21 suggests patient would be a poor candidate for potential TIPS creation for recurrent symptomatic ascites. Would recommend continued medical optimization and if MELD score were to improve (ideally < 14) , the patient could be referred to the Interventional Radiology Clinic [phone]) for formal TIPS consultation as indicated. Electronically Signed   By: Sandi Mariscal M.D.   On: 02/02/2017 15:09   Dg Chest Port 1 View  Result Date: 02/03/2017 CLINICAL DATA:  Leukocytosis EXAM: PORTABLE CHEST 1 VIEW COMPARISON:  01/31/2017 FINDINGS: Normal heart size and mediastinal contours. No acute infiltrate or edema. No effusion or pneumothorax. No acute osseous findings. IMPRESSION: Negative portable chest. Electronically Signed   By: Monte Fantasia M.D.   On: 02/03/2017 09:41   Dg Chest Port 1 View  Result Date: 01/31/2017 CLINICAL DATA:  Sepsis EXAM: PORTABLE CHEST 1 VIEW COMPARISON:  None. FINDINGS: No focal consolidation or pleural effusion. Normal heart size. No pneumothorax. Circular opacity over the right neck is likely an artifact. IMPRESSION: Low lung volumes.  No acute infiltrate or edema. Electronically Signed    By: Donavan Foil M.D.   On: 01/31/2017 02:02   Ir Paracentesis  Result Date: 02/02/2017 INDICATION: History of alcoholic cirrhosis and ascites. Please perform ultrasound-guided paracentesis and transjugular liver biopsy with acquisition of pressure measurements for diagnostic and therapeutic purposes. EXAM: 1. FLUOROSCOPIC GUIDED TRANSJUGULAR LIVER BIOPSY WITH ACQUISITION OF PRESSURE MEASUREMENTS 2. ULTRASOUND GUIDANCE FOR VENOUS ACCESS 3. ULTRASOUND-GUIDED PARACENTESIS COMPARISON:  CT abdomen pelvis - 12/23/2016; ultrasound-guided paracentesis - 01/31/2017; 01/18/2017 MEDICATIONS: None CONTRAST:  20 cc Isovue-300 ANESTHESIA/SEDATION: Moderate (conscious) sedation was employed during this procedure. A total of Versed 3 mg and Fentanyl 75 mcg was administered intravenously. Moderate Sedation Time: 35 minutes. The patient's level of consciousness and vital signs were monitored continuously by radiology nursing throughout the procedure under my direct supervision. FLUOROSCOPY TIME:  5 minutes 48 seconds (46 mGy) COMPLICATIONS: None immediate. TECHNIQUE: Informed written consent was obtained from the patient after a discussion of the risks, benefits and alternatives to treatment. Questions regarding the procedure were encouraged and answered. A timeout was performed prior to the initiation of the procedure. Ultrasound scanning of the right lower abdominal quadrant demonstrated a moderate amount of intra-abdominal ascites. The skin overlying the lateral aspect of the right mid/lower abdomen was prepped and draped in usual sterile fashion. The overlying skin was anesthetized with 1% lidocaine. This allowed for placement of a Safe-T-Centesis catheter. The paracentesis was performed yielding 2700 cc of dark yellow serous ascitic fluid. The Safe-T-Centesis catheter was removed intact and a dressing was placed. Attention was now paid towards the transjugular liver biopsy. The right neck was prepped and draped in the  usual sterile fashion, and a sterile drape was applied covering the operative field. Maximum barrier sterile technique with sterile gowns and gloves were used for the procedure. A timeout was performed prior to the initiation of the procedure. Local anesthesia was provided with 1% lidocaine with epinephrine. The right internal jugular vein was accessed with a micropuncture kit under direct ultrasound guidance. An ultrasound image was saved for documentation purposes. The micropuncture sheath was exchanged for a 9-French vascular sheath over a Benson wire. A 100 cm Bernstein catheter was advanced over a stiff Glidewire to select the middle hepatic vein. A hepatic venogram was performed. Free hepatic and right atrial pressures were obtained with the Berenstein catheter. The Berenstein catheter was exchanged for a Dextera transjugular biopsy sheath. 3 core needle biopsies were obtained and  anterior to the middle hepatic vein under direct fluoroscopic guidance. Samples were deemed adequate, placed in formalin and submitted to pathology. At this point the procedure was terminated. All wires, catheters and sheaths removed and the patient. Hemostasis was achieved at the right neck access site with manual compression. A dressing was placed. The patient tolerated the procedure well without immediate postprocedural complication. FINDINGS: Normal hepatic venogram.  Pressure measurements as follows: Free hepatic mean pressure - 12 mmHg Wedged hepatic mean pressure - 29 mmHg Gradient 17 Successful acquisition of 3 core needle biopsies anterior to the middle hepatic vein. IMPRESSION: 1. Portosystemic gradient of 17 compatible with portal venous hypertension. 2. Successful fluoroscopic guided transjugular liver biopsy. 3. Normal hepatic venogram. PLAN: Unfortunately current MELD score of 21 suggests patient would be a poor candidate for potential TIPS creation for recurrent symptomatic ascites. Would recommend continued medical  optimization and if MELD score were to improve (ideally < 14) , the patient could be referred to the Interventional Radiology Clinic [phone]) for formal TIPS consultation as indicated. Electronically Signed   By: Sandi Mariscal M.D.   On: 02/02/2017 15:09    Microbiology: Recent Results (from the past 240 hour(s))  Blood culture (routine x 2)     Status: None (Preliminary result)   Collection Time: 01/30/17  9:40 PM  Result Value Ref Range Status   Specimen Description BLOOD BLOOD LEFT FOREARM  Final   Special Requests   Final    BOTTLES DRAWN AEROBIC AND ANAEROBIC Blood Culture adequate volume   Culture   Final    NO GROWTH 4 DAYS Performed at [facility], [place] [address] [place], [place] [address]    Report Status PENDING  Incomplete  Blood culture (routine x 2)     Status: None (Preliminary result)   Collection Time: 01/30/17  9:45 PM  Result Value Ref Range Status   Specimen Description BLOOD BLOOD RIGHT FOREARM  Final   Special Requests   Final    BOTTLES DRAWN AEROBIC ONLY Blood Culture adequate volume   Culture   Final    NO GROWTH 4 DAYS Performed at [facility], [place] [address], [place], [place] [address]    Report Status PENDING  Incomplete  Surgical PCR screen     Status: None   Collection Time: 01/31/17  2:08 AM  Result Value Ref Range Status   MRSA, PCR NEGATIVE NEGATIVE Final   Staphylococcus aureus NEGATIVE NEGATIVE Final    Comment:        The Xpert SA Assay (FDA approved for NASAL specimens in patients over 51 years of age), is one component of a comprehensive surveillance program.  Test performance has been validated by [facility] for patients greater than or equal to 31 year old. It is not intended to diagnose infection nor to guide or monitor treatment.   Culture, body fluid-bottle     Status: None   Collection Time: 01/31/17  2:18 PM  Result Value Ref Range Status   Specimen Description FLUID ASCITIC  Final   Special Requests BOTTLES  DRAWN AEROBIC AND ANAEROBIC  Final   Culture   Final    NO GROWTH 4 DAYS TEST WILL BE CREDITED ORDERED UNDER WRONG ACCOUNT. SEE [place] ADMISSION.   Report Status 02/04/2017 FINAL  Final  Gram stain     Status: None   Collection Time: 01/31/17  2:18 PM  Result Value Ref Range Status   Specimen Description FLUID ASCITIC  Final  Special Requests NONE  Final   Gram Stain   Final    WBC PRESENT, PREDOMINANTLY PMN NO ORGANISMS SEEN CYTOSPIN SMEAR TEST WILL BE CREDITED ORDERED UNDER WRONG ACCOUNT. SEE [place] ADMISSION.    Report Status 02/01/2017 FINAL  Final  Culture, body fluid-bottle     Status: None (Preliminary result)   Collection Time: 01/31/17  2:18 PM  Result Value Ref Range Status   Specimen Description FLUID ASCITIC  Final   Special Requests NONE  Final   Culture   Final    NO GROWTH 4 DAYS Performed at [facility] [address] [place], [place] [address]    Report Status PENDING  Incomplete  Gram stain     Status: None   Collection Time: 01/31/17  2:18 PM  Result Value Ref Range Status   Specimen Description FLUID ASCITIC  Final   Special Requests NONE  Final   Gram Stain   Final    WBC PRESENT, PREDOMINANTLY PMN NO ORGANISMS SEEN CYTOSPIN SMEAR Performed at [facility], [place] [address], [place], [place] [address]    Report Status 02/04/2017 FINAL  Final     Labs: Basic Metabolic Panel:  Recent Labs Lab 02/01/17 0845 02/02/17 0815 02/03/17 0759 02/03/17 1411 02/04/17 0554 02/05/17 0526  NA 131* 134* 131*  --  131* 130*  K 4.0 4.4 5.1  --  4.0 4.6  CL 100* 101 97*  --  96* 95*  CO2 _0 --  26 25  GLUCOSE 123* 88 128*  --  112* 119*  BUN 22* 25* 26*  --  25* 25*  CREATININE 0.44 0.44 0.48  --  0.44 0.43*  CALCIUM 8.0* 8.6* 8.8*  --  8.3* 8.4*  MG  --   --   --  1.6*  --  1.9   Liver Function Tests:  Recent Labs Lab 02/01/17 0845 02/02/17 0815 02/03/17 0759 02/04/17 0554 02/05/17 0526  AST 57* 72* 151* 57* 59*   ALT 62* 69* 101* 69* 64*  ALKPHOS 441* 525* 647* 491* 561*  BILITOT 10.3* 9.5* 13.5* 9.4* 7.8*  PROT 4.6* 4.9* 5.5* 4.6* 4.7*  ALBUMIN 2.5* 2.6* 2.8* 2.3* 2.2*    Recent Labs Lab 01/30/17 1957  LIPASE 37    Recent Labs Lab 01/30/17 2140 02/03/17 0759  AMMONIA 102* 83*   CBC:  Recent Labs Lab 02/01/17 0845 02/02/17 0815 02/03/17 0759 02/04/17 0554 02/05/17 0526  WBC 29.5* 36.3* 45.5* 39.1* 40.5*  NEUTROABS 27.1* 32.8*  --   --   --   HGB 9.1* 10.3* 11.5* 9.4* 10.0*  HCT 25.2* 29.5* 33.2* 26.4* 27.9*  MCV 103.7* 103.5* 104.7* 103.1* 102.2*  PLT 161 184 182 158 165   Cardiac Enzymes: No results for input(s): CKTOTAL, CKMB, CKMBINDEX, TROPONINI in the last 168 hours. BNP: BNP (last 3 results) No results for input(s): BNP in the last 8760 hours.  ProBNP (last 3 results) No results for input(s): PROBNP in the last 8760 hours.  CBG: No results for input(s): GLUCAP in the last 168 hours.     SignedCristal Ford  [facility] 02/05/2017, 11:34 AM

## 2017-02-05 NOTE — Progress Notes (Signed)
Per Dr. Benson Norway okay to remove neck dressing and apply bandaid.

## 2017-02-05 NOTE — Care Management Note (Signed)
Case Management Note  Patient Details  Name: Mercedes Mitchell MRN: [id number] Date of Birth: 1961-03-13  Subjective/Objective:   Abdominal pain, acute encephalopathy, severe alcoholic hepatitis with liver cirrhosis             Action/Plan: Discharge Planning: NCM spoke to pt and explained [employer], [employer] Provider with arrange [facility]. Provided pt with contact number to follow up. Will fax paperwork to [facility], fax # [phone]. Contacted [facility] DME rep for 3n1 for home. Provided information on fax for [employer] to reimburse.   PCP [place], BETTY G MD   Expected Discharge Date:  02/05/17               Expected Discharge Plan:  [place]  In-House Referral:  NA  Discharge planning Services  CM Consult  Post Acute Care Choice:  Home Health Choice offered to:  NA  DME Arranged:  3-N-1 DME Agency:  [place]:  PT, OT [facility] Agency:  Other - See comment  Status of Service:  Completed, signed off  If discussed at [place] of Stay Meetings, dates discussed:    Additional Comments:  Erenest Rasher, RN 02/05/2017, 11:59 AM

## 2017-02-07 ENCOUNTER — Other Ambulatory Visit (HOSPITAL_COMMUNITY): Payer: 59

## 2017-02-08 ENCOUNTER — Telehealth: Payer: Self-pay

## 2017-02-08 ENCOUNTER — Ambulatory Visit: Payer: Self-pay | Admitting: Nurse Practitioner

## 2017-02-08 NOTE — Telephone Encounter (Signed)
D/C 02/05/17 To: home  Spoke with pt and she is starting to retain fluid again. She is taking all medications as directed. She did see her hepatologist today and they noted that she has significant right leg edema. She states that they were able to feel pulses in her leg but did recommend she have it checked as soon as possible. Advised pt to monitor her fluid intake and take all medications as directed, keep legs elevated and call if any significant change in symptoms. Pt has been scheduled for follow up tomorrow at 2:30pm, attempted to schedule pt for sooner morning appt but she refused.   Dr. Martinique - Juluis Rainier   Transition Care Management Follow-up Telephone Call  How have you been since you were released from the hospital? Still not well   Do you understand why you were in the hospital? yes   Do you understand the discharge instrcutions? yes  Items Reviewed:  Medications reviewed: yes  Allergies reviewed: yes  Dietary changes reviewed: yes  Referrals reviewed: yes   Functional Questionnaire:   Activities of Daily Living (ADLs):   She states they are independent in the following: ambulation, bathing and hygiene, feeding, continence, grooming, toileting and dressing States they require assistance with the following: driving   Any transportation issues/concerns?: no   Any patient concerns? no   Confirmed importance and date/time of follow-up visits scheduled: yes   Confirmed with patient if condition begins to worsen call PCP or go to the ER.  Patient was given the Call-a-Nurse line [phone]: yes

## 2017-02-08 NOTE — Progress Notes (Signed)
HPI:   [name] is a 56 y.o. female, who is here today with her husband to follow on recent hospitalization.  She was seen on 12/23/16, when I referred her to the ER because oliguria,ascitis,and signs/symptoms of acute liver disease. Hospitalized from 12/23/16 to 12/28/16.  Accidental fall with frontal laceration on 01/19/17. She has not had falls since then.  She was hospitalized on 01/30/17 and discharged 02/05/17, she presented to the ER c/o worsening abdominal pain and distension.   Discharge Dx: Worsening abdominal distension/severe acute alcoholic hepatitis with liver cirrhosis. Acute encephalitis. Severe protein caloric malnutrition. Macrocytic anemia. Pressure ulcer   [place] 3rd paracentesis 01/31/17 (5.5 L), hepatic Bx: cirrhosis with acute hepatitis and cholestasis.  Hepatologists appt arranged for 02/08/17.  During hospitalization mildly abnormal TSH, no Hx of thyroid disease.  Lab Results  Component Value Date   TSH 0.316 (L) 02/03/2017   Mild abdominal discomfort, slowly distending but much better than right before she had her last paracentesis. She states that it was some discussion about doing another one today or tomorrow but husband does not think so.Minimal nausea, denies vomiting.   Lab Results  Component Value Date   WBC 40.5 (H) 02/05/2017   HGB 10.0 (L) 02/05/2017   HCT 27.9 (L) 02/05/2017   MCV 102.2 (H) 02/05/2017   PLT 165 02/05/2017     Chemistry      Component Value Date/Time   NA 130 (L) 02/05/2017 0526   K 4.6 02/05/2017 0526   CL 95 (L) 02/05/2017 0526   CO2 25 02/05/2017 0526   BUN 25 (H) 02/05/2017 0526   CREATININE 0.43 (L) 02/05/2017 0526      Component Value Date/Time   CALCIUM 8.4 (L) 02/05/2017 0526   ALKPHOS 561 (H) 02/05/2017 0526   AST 59 (H) 02/05/2017 0526   ALT 64 (H) 02/05/2017 0526   BILITOT 7.8 (H) 02/05/2017 0526       She is having loose and frequent stools, during the day and night with some  episodes of fecal incontinence, attributed to Lactulose. Lactulose was increased yesterday to 60 ml daily. She has about 5 soft and loose stools daily, she has had 5 today, last one a few minutes ago (husband had to take her to the restroom in the middle of interview).This is interrupting her sleep and so her husband's.  She is also on lasix 60 mg daily and Spironolactone 150 mg daily, having good diuresis.    Appetite is progressively inproving, she has noted some weight gain.  She is receiving home PT twice per week and OT twice per week.  She has a toilet/shower chair using the walker , seated most of the time.  Overall she tells me that she is feeling better.  Husband is concerned because she states by herself most of the time, sometimes he has to travel as part of his job. She needs assistance for most of her ADL's (shower,toilet,dressing, and transfer) and IADL's.  Yesterday she followed with liver specialists and according to pt, she is starting process for liver transplant. She is going to [place] meeting this coming Monday, today  She is 47 days with no alcohol intake. She is very motivated about recovering from alcoholism and understands this is a crucial part of her treatment.  She and her husband are concerned about RLE edema, which was noted by husband the day after hospital discharge. She denies any pain, has had skin pruritus, no erythema or rash. Sleeping  with LE elevated, which helps some with edema. LLE edema resolved with diuretic treatment.  Upon further questioning she also has had mild exertional dyspnea. She denies chest pain, palpitations, or syncope. + Lightheadedness with rapid movements.   Review of Systems  Constitutional: Positive for appetite change and fatigue. Negative for chills and fever.  HENT: Negative for mouth sores, nosebleeds, sore throat and trouble swallowing.   Eyes: Negative for redness and visual disturbance.  Respiratory: Positive for shortness  of breath. Negative for cough and wheezing.   Cardiovascular: Positive for leg swelling. Negative for chest pain and palpitations.  Gastrointestinal: Positive for abdominal distention and diarrhea. Negative for abdominal pain, blood in stool and vomiting.  Endocrine: Negative for cold intolerance and heat intolerance.  Genitourinary: Negative for decreased urine volume, difficulty urinating, dysuria and hematuria.  Musculoskeletal: Positive for gait problem.  Skin: Negative for rash.  Neurological: Positive for light-headedness. Negative for syncope, weakness and headaches.  Hematological: Bruises/bleeds easily.  Psychiatric/Behavioral: Positive for sleep disturbance. Negative for confusion, hallucinations and suicidal ideas. The patient is nervous/anxious.      Current Outpatient Prescriptions on File Prior to Visit  Medication Sig Dispense Refill  . bacitracin ointment Apply 1 application topically 3 (three) times daily. 15 g 1  . camphor-menthol (SARNA) lotion Apply topically as needed for itching. 222 mL 0  . docusate sodium (COLACE) 100 MG capsule Take 1 capsule (100 mg total) by mouth 2 (two) times daily. 10 capsule 0  . feeding supplement, ENSURE ENLIVE, (ENSURE ENLIVE) LIQD Take 237 mLs by mouth 3 (three) times daily between meals. 90 Bottle 0  . folic acid (FOLVITE) 1 MG tablet Take 1 tablet (1 mg total) by mouth daily. 30 tablet 0  . furosemide (LASIX) 20 MG tablet Take 3 tablets (60 mg total) by mouth daily. 90 tablet 0  . lactulose (CHRONULAC) 10 GM/15ML solution Take 30 mLs (20 g total) by mouth 2 (two) times daily. 240 mL 0  . Multiple Vitamin (MULTIVITAMIN WITH MINERALS) TABS tablet Take 1 tablet by mouth daily. 30 tablet 0  . pantoprazole (PROTONIX) 40 MG tablet Take 1 tablet (40 mg total) by mouth 2 (two) times daily. 60 tablet 0  . prednisoLONE (PRELONE) 15 MG/5ML SOLN Take 13.3 mLs (40 mg total) by mouth daily before breakfast. Continue until tapered by GI. 450 mL 0  .  psyllium (HYDROCIL/METAMUCIL) 95 % PACK Take 1 packet by mouth daily. 30 each 0  . spironolactone (ALDACTONE) 50 MG tablet Take 3 tablets (150 mg total) by mouth daily. 90 tablet 0  . thiamine 100 MG tablet Take 1 tablet (100 mg total) by mouth daily. 30 tablet 0  . traZODone (DESYREL) 50 MG tablet Take 1/2 tablet HS to start Increase to 1-2 tablets HS as needed for sleep 60 tablet 0   No current facility-administered medications on file prior to visit.      Past Medical History:  Diagnosis Date  . 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  paracentesis 2.6 liters 5/4: no SBP.    Marland Kitchen Alcoholism ([place]) 12/2016  . Dysphagia 2011  . Macrocytic anemia 12/2016  . Malnutrition ([place]) 12/2016  . Panic type anxiety neurosis 2011   No Known Allergies  Social History   Social History  . Marital status: Married    Spouse name: N/A  . Number of children: N/A  . Years of education: N/A   Occupational History  .  Unemployed    Social History Main Topics  . Smoking status: Former Smoker    Types: Cigarettes  . Smokeless tobacco: Never Used     Comment: Reports she quit "about 6 months ago"  . Alcohol use Yes     Comment: Last drink: 36 days ago   . Drug use: No     Comment: Last use Cocaine 11/17, reports last use "over a year", [facility] 2/18, reports "a year" ago  . Sexual activity: Not Asked   Other Topics Concern  . None   Social History Narrative   Married, children, not working    Vitals:   02/09/17 1436  BP: 106/64  Pulse: (!) 116  Resp: 16  O2 sat at RA 98% Body mass index is 16.46 kg/m.  Physical Exam  Nursing note and vitals reviewed. Constitutional: She is oriented to person, place, and time. She appears well-developed. No distress.  Undernourished.   HENT:  Head: Atraumatic.  Mouth/Throat: Uvula is midline, oropharynx is clear and moist and mucous membranes are normal.  Eyes: Conjunctivae and EOM are  normal. Pupils are equal, round, and reactive to light. Scleral icterus is present.  Cardiovascular: Regular rhythm.  Tachycardia present.   No murmur heard. DP present LE bilateral. Holmes test negative RLE.  Respiratory: Effort normal. No respiratory distress. She has decreased breath sounds (mild) in the right upper field, the right middle field and the right lower field. She has no wheezes. She has no rhonchi. She has no rales.  GI: Soft. She exhibits ascites. She exhibits no mass. There is no tenderness.  Musculoskeletal: She exhibits edema (RLE and pedal 3+ pitting edema, LLE trace pitting edema.). She exhibits no tenderness.  Neurological: She is alert and oriented to person, place, and time. She has normal strength. Coordination normal.  Unstable gait, assisted by walker.  Skin: Skin is warm. Ecchymosis and laceration (On forehead, healing.) noted. No erythema. There is pallor.  Psychiatric: Her mood appears anxious.  Appropriately groomed, good eye contact.    ASSESSMENT AND PLAN:   Mercedes Mitchell was seen today for hospitalization follow-up.  Diagnoses and all orders for this visit:  Edema of right lower extremity  We could not arranged an stat US, I was told we will be able to do it as out pt in the next 2 days. So she will have to go through the ER in order to have it done today. We discussed possible etiologies of RLE edema. The fact that she has been less mobile + some exertional dyspnea+ mild hypoventilation of right lung on auscultation raise the concern for DVT and/or PE.  We also discussed other possible causes, including protein-calorie malnutrition among some. We also discuss DVT symptoms and treatment + side effects. If in fact she has a thrombotic event I am not sure if anticoagulation would be an option given her Hx of liver disease and Hx of GI bleed.   -     VAS [place] LOWER EXTREMITY VENOUS (DVT); Future  Abnormal TSH  Possible causes discussed, most likely related to  acute illness. If DVT/PE is ruled out , we will bring her back for labs: TSH,CBC,and BMP.  Alcoholic cirrhosis of liver with ascites [facility])  She has labs pending from liver specialists. Fall prevention. We discussed side effects of some of her current medications, some very annoyed as the urinary frequency and diarrhea but at this time very necessary to improve her current condition.Hopefully some can be decreased if appropriate as her condition  improves. She understands this and planning on taking all her medications as instructed.  She is a good candidate for palliative care program through [facility], I did not discuss this with her yet.   Macrocytic anemia  She has not had episodes of hematemesis or melena since hospital discharge. Will plan on re-checking CBC in the next few days.  Anxiety disorder, unspecified type  She is currently on Trazodone. Continue counseling thought Behavioral Health. We also discussed the option of psychiatric evaluation, she is not very interested for now. She tells me that she was supposed to see one in the hospital but "never came."  ETOH abuse  Planning on attending [place] meetings. Has been sobered for 47 days, she was congratulated and encouraged to continue treatment.     Lyris Hitchman G. [place], MD  [facility]. [place] office.

## 2017-02-08 NOTE — Telephone Encounter (Signed)
LMTCB

## 2017-02-09 ENCOUNTER — Ambulatory Visit (INDEPENDENT_AMBULATORY_CARE_PROVIDER_SITE_OTHER): Payer: Managed Care, Other (non HMO) | Admitting: Family Medicine

## 2017-02-09 ENCOUNTER — Encounter (HOSPITAL_COMMUNITY): Payer: Self-pay

## 2017-02-09 ENCOUNTER — Encounter (HOSPITAL_COMMUNITY): Payer: Self-pay | Admitting: Psychology

## 2017-02-09 ENCOUNTER — Encounter: Payer: Self-pay | Admitting: Family Medicine

## 2017-02-09 ENCOUNTER — Telehealth (HOSPITAL_COMMUNITY): Payer: Self-pay | Admitting: Psychology

## 2017-02-09 ENCOUNTER — Inpatient Hospital Stay (HOSPITAL_COMMUNITY)
Admission: EM | Admit: 2017-02-09 | Discharge: 2017-02-13 | DRG: 299 | Disposition: A | Discharge status: Home Health | Payer: Managed Care, Other (non HMO) | Attending: Internal Medicine | Admitting: Internal Medicine

## 2017-02-09 ENCOUNTER — Emergency Department (HOSPITAL_COMMUNITY): Payer: Managed Care, Other (non HMO)

## 2017-02-09 ENCOUNTER — Emergency Department (HOSPITAL_BASED_OUTPATIENT_CLINIC_OR_DEPARTMENT_OTHER)
Admit: 2017-02-09 | Discharge: 2017-02-09 | Disposition: A | Discharge status: Home / Self Care | Payer: Managed Care, Other (non HMO) | Attending: Emergency Medicine | Admitting: Emergency Medicine

## 2017-02-09 ENCOUNTER — Other Ambulatory Visit (HOSPITAL_COMMUNITY): Payer: 59

## 2017-02-09 VITALS — BP 106/64 | HR 116 | Resp 16 | Ht 66.0 in | Wt 102.0 lb

## 2017-02-09 DIAGNOSIS — Z79899 Other long term (current) drug therapy: Secondary | ICD-10-CM

## 2017-02-09 DIAGNOSIS — E43 Unspecified severe protein-calorie malnutrition: Secondary | ICD-10-CM | POA: Diagnosis present

## 2017-02-09 DIAGNOSIS — D539 Nutritional anemia, unspecified: Secondary | ICD-10-CM

## 2017-02-09 DIAGNOSIS — I82411 Acute embolism and thrombosis of right femoral vein: Secondary | ICD-10-CM | POA: Diagnosis present

## 2017-02-09 DIAGNOSIS — K7031 Alcoholic cirrhosis of liver with ascites: Secondary | ICD-10-CM

## 2017-02-09 DIAGNOSIS — R1084 Generalized abdominal pain: Secondary | ICD-10-CM

## 2017-02-09 DIAGNOSIS — R600 Localized edema: Secondary | ICD-10-CM | POA: Diagnosis not present

## 2017-02-09 DIAGNOSIS — D72828 Other elevated white blood cell count: Secondary | ICD-10-CM

## 2017-02-09 DIAGNOSIS — F419 Anxiety disorder, unspecified: Secondary | ICD-10-CM

## 2017-02-09 DIAGNOSIS — Z681 Body mass index (BMI) 19 or less, adult: Secondary | ICD-10-CM

## 2017-02-09 DIAGNOSIS — M7989 Other specified soft tissue disorders: Secondary | ICD-10-CM

## 2017-02-09 DIAGNOSIS — R109 Unspecified abdominal pain: Secondary | ICD-10-CM | POA: Diagnosis present

## 2017-02-09 DIAGNOSIS — K7040 Alcoholic hepatic failure without coma: Secondary | ICD-10-CM | POA: Diagnosis present

## 2017-02-09 DIAGNOSIS — K7011 Alcoholic hepatitis with ascites: Secondary | ICD-10-CM | POA: Diagnosis present

## 2017-02-09 DIAGNOSIS — R7989 Other specified abnormal findings of blood chemistry: Secondary | ICD-10-CM

## 2017-02-09 DIAGNOSIS — R0602 Shortness of breath: Secondary | ICD-10-CM | POA: Diagnosis not present

## 2017-02-09 DIAGNOSIS — D72829 Elevated white blood cell count, unspecified: Secondary | ICD-10-CM | POA: Diagnosis present

## 2017-02-09 DIAGNOSIS — I82409 Acute embolism and thrombosis of unspecified deep veins of unspecified lower extremity: Secondary | ICD-10-CM | POA: Diagnosis present

## 2017-02-09 DIAGNOSIS — Z87891 Personal history of nicotine dependence: Secondary | ICD-10-CM

## 2017-02-09 DIAGNOSIS — Z8249 Family history of ischemic heart disease and other diseases of the circulatory system: Secondary | ICD-10-CM

## 2017-02-09 DIAGNOSIS — K7010 Alcoholic hepatitis without ascites: Secondary | ICD-10-CM | POA: Diagnosis present

## 2017-02-09 DIAGNOSIS — R946 Abnormal results of thyroid function studies: Secondary | ICD-10-CM

## 2017-02-09 DIAGNOSIS — Z811 Family history of alcohol abuse and dependence: Secondary | ICD-10-CM

## 2017-02-09 DIAGNOSIS — F1010 Alcohol abuse, uncomplicated: Secondary | ICD-10-CM

## 2017-02-09 DIAGNOSIS — E871 Hypo-osmolality and hyponatremia: Secondary | ICD-10-CM | POA: Diagnosis present

## 2017-02-09 DIAGNOSIS — Z8349 Family history of other endocrine, nutritional and metabolic diseases: Secondary | ICD-10-CM

## 2017-02-09 DIAGNOSIS — D696 Thrombocytopenia, unspecified: Secondary | ICD-10-CM | POA: Diagnosis present

## 2017-02-09 LAB — CBC WITH DIFFERENTIAL/PLATELET
BASOS PCT: 0 %
Basophils Absolute: 0 10*3/uL (ref 0.0–0.1)
EOS ABS: 0 10*3/uL (ref 0.0–0.7)
Eosinophils Relative: 0 %
HCT: 27.9 % — ABNORMAL LOW (ref 36.0–46.0)
HEMOGLOBIN: 10 g/dL — AB (ref 12.0–15.0)
Lymphocytes Relative: 4 %
Lymphs Abs: 1.7 10*3/uL (ref 0.7–4.0)
MCH: 36.6 pg — AB (ref 26.0–34.0)
MCHC: 35.8 g/dL (ref 30.0–36.0)
MCV: 102.2 fL — AB (ref 78.0–100.0)
MONO ABS: 0.4 10*3/uL (ref 0.1–1.0)
Monocytes Relative: 1 %
NEUTROS ABS: 41.1 10*3/uL — AB (ref 1.7–7.7)
NEUTROS PCT: 95 %
Platelets: 181 10*3/uL (ref 150–400)
RBC: 2.73 MIL/uL — ABNORMAL LOW (ref 3.87–5.11)
RDW: 16.3 % — ABNORMAL HIGH (ref 11.5–15.5)
WBC: 43.2 10*3/uL — ABNORMAL HIGH (ref 4.0–10.5)

## 2017-02-09 LAB — COMPREHENSIVE METABOLIC PANEL
ALK PHOS: 587 U/L — AB (ref 38–126)
ALT: 94 U/L — AB (ref 14–54)
ANION GAP: 9 (ref 5–15)
AST: 92 U/L — ABNORMAL HIGH (ref 15–41)
Albumin: 2.4 g/dL — ABNORMAL LOW (ref 3.5–5.0)
BUN: 34 mg/dL — ABNORMAL HIGH (ref 6–20)
CALCIUM: 8.3 mg/dL — AB (ref 8.9–10.3)
CO2: 27 mmol/L (ref 22–32)
CREATININE: 0.78 mg/dL (ref 0.44–1.00)
Chloride: 96 mmol/L — ABNORMAL LOW (ref 101–111)
Glucose, Bld: 117 mg/dL — ABNORMAL HIGH (ref 65–99)
Potassium: 5 mmol/L (ref 3.5–5.1)
SODIUM: 132 mmol/L — AB (ref 135–145)
TOTAL PROTEIN: 5.2 g/dL — AB (ref 6.5–8.1)
Total Bilirubin: 7.9 mg/dL — ABNORMAL HIGH (ref 0.3–1.2)

## 2017-02-09 LAB — PROTIME-INR
INR: 1.54
PROTHROMBIN TIME: 18.6 s — AB (ref 11.4–15.2)

## 2017-02-09 LAB — POC OCCULT BLOOD, ED: Fecal Occult Bld: NEGATIVE

## 2017-02-09 LAB — APTT: aPTT: 34 seconds (ref 24–36)

## 2017-02-09 LAB — BRAIN NATRIURETIC PEPTIDE: B Natriuretic Peptide: 113.2 pg/mL — ABNORMAL HIGH (ref 0.0–100.0)

## 2017-02-09 MED ORDER — IOPAMIDOL (ISOVUE-370) INJECTION 76%
100.0000 mL | Freq: Once | INTRAVENOUS | Status: AC | PRN
  Administered 2017-02-09: 100 mL via INTRAVENOUS

## 2017-02-09 MED ORDER — SODIUM CHLORIDE 0.9 % IV SOLN
Freq: Once | INTRAVENOUS | Status: AC
  Administered 2017-02-09: 21:00:00 via INTRAVENOUS

## 2017-02-09 MED ORDER — IOPAMIDOL (ISOVUE-370) INJECTION 76%
INTRAVENOUS | Status: AC
  Filled 2017-02-09: qty 100

## 2017-02-09 NOTE — Patient Instructions (Addendum)
A few things to remember from today's visit:   Edema of right lower extremity - Plan: VAS [place] LOWER EXTREMITY VENOUS (DVT)  Going to the ER Please be sure medication list is accurate. If a new problem present, please set up appointment sooner than planned today.

## 2017-02-09 NOTE — Progress Notes (Signed)
VASCULAR LAB PRELIMINARY  PRELIMINARY  PRELIMINARY  PRELIMINARY  Bilateral lower extremity venous duplex completed per protocol when DVT is noted in the requested lower extremity  Preliminary report:  Right - Positive for an acute DVT noted coursing from the mid femoral vein through the common femoral vein just distal to the saphenofemoral junction. No evidence of superfic1al thrombosis or Deviney's cyst Left - No evidence of DVT, superficial thrombosis, or Wimer's cyst.   Ponciano Shealy, RVS 02/09/2017, 7:29 PM

## 2017-02-09 NOTE — ED Triage Notes (Signed)
She is here with her son. She is here with c/o persistent right leg/ankle/foot swelling. She was just released from hospitalization r/t hepatic failure.

## 2017-02-09 NOTE — ED Notes (Signed)
Pt is alert and oriented x 4 and is verbally responsive. Pt  spouse is at bedside. Pt appears frail, eyes are jaundice, and is c/o of generalized weakness and discomfort. Pt has multiple scabs all over body one is noted on forehead. LE Venous was done and was positive. [place] tech stated that she made MD aware.

## 2017-02-09 NOTE — ED Provider Notes (Signed)
[facility] DEPT Provider Note   CSN: [id number] Arrival date & time: 02/09/17  1605     History   Chief Complaint Chief Complaint  Patient presents with  . Leg Swelling    HPI Kyle Stansell is a 56 y.o. female.  HPI   56 yo F with PMHx alcoholc hepatitis c/b cirrhosis with ascites here with RLE swelling. Pt states that for the past 2-3 days, she has had progressively worsening and now severe RLE swelling. She has had associated aching, throbbing pain. Over the past 24 hours, her swelling has significantly worsened and is now severe. She has significant pain in her popliteal area. No fevers. She has mild associated SOB as well, no chest pain. No abdominal pain, nausea, or vomiting. She has increased anxiety as well. Of note, she has a h/o recurrent recent hospitalizations for Candidal erosive esophagitis with GIB, as well as worsening cirrhosis c/b symptomatic ascites.  Past Medical History:  Diagnosis Date  . 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  paracentesis 2.6 liters 5/4: no SBP.    Marland Kitchen Alcoholism ([place]) 12/2016  . Dysphagia 2011  . Macrocytic anemia 12/2016  . Malnutrition ([place]) 12/2016  . Panic type anxiety neurosis 2011    Patient Active Problem List   Diagnosis Date Noted  . Abdominal pain 02/10/2017  . Leukocytosis   . Alcoholic cirrhosis of liver with ascites ([place]) 01/12/2017  . GI bleeding 01/12/2017  . Current every day smoker 01/12/2017  . Ascites   . Coffee ground emesis   . Acute esophagitis   . Protein-calorie malnutrition, severe 12/24/2016  . Acute alcoholic hepatitis 78/29/5621  . ETOH abuse 12/23/2016  . Macrocytic anemia 12/21/2016  . 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 DYSPHAGIA 03/25/2010    Past Surgical History:  Procedure Laterality Date  . ESOPHAGOGASTRODUODENOSCOPY N/A 12/25/2016   Procedure: ESOPHAGOGASTRODUODENOSCOPY (EGD);  Surgeon: Jerene Bears, MD;   Location: [facility];  Service: Endoscopy;  Laterality: N/A;  . IR PARACENTESIS  02/02/2017  . IR TRANSCATHETER BX  02/02/2017  . IR US GUIDE VASC ACCESS RIGHT  02/02/2017  . IR VENOGRAM HEPATIC W HEMODYNAMIC EVALUATION  02/02/2017    OB History    No data available       Home Medications    Prior to Admission medications   Medication Sig Start Date End Date Taking? Authorizing Provider  bacitracin ointment Apply 1 application topically 3 (three) times daily. 01/26/17  Yes Waynetta Pean, PA-C  camphor-menthol [facility]) lotion Apply topically as needed for itching. 02/05/17  Yes Mikhail, Velta Addison, DO  docusate sodium (COLACE) 100 MG capsule Take 1 capsule (100 mg total) by mouth 2 (two) times daily. 02/05/17  Yes Mikhail, [place], DO  feeding supplement, ENSURE ENLIVE, (ENSURE ENLIVE) LIQD Take 237 mLs by mouth 3 (three) times daily between meals. 12/28/16  Yes Mikhail, Velta Addison, DO  folic acid (FOLVITE) 1 MG tablet Take 1 tablet (1 mg total) by mouth daily. 12/29/16  Yes Mikhail, Maryann, DO  furosemide (LASIX) 20 MG tablet Take 3 tablets (60 mg total) by mouth daily. 02/06/17  Yes Mikhail, Maryann, DO  lactulose (CHRONULAC) 10 GM/15ML solution Take 30 mLs (20 g total) by mouth 2 (two) times daily. 02/05/17  Yes Mikhail, [place], DO  Multiple Vitamin (MULTIVITAMIN WITH MINERALS) TABS tablet Take 1 tablet by mouth daily. 12/29/16  Yes Mikhail, Maryann, DO  pantoprazole (PROTONIX) 40 MG tablet Take 1 tablet (40 mg total)  by mouth 2 (two) times daily. 12/28/16  Yes Mikhail, Velta Addison, DO  prednisoLONE (PRELONE) 15 MG/5ML SOLN Take 13.3 mLs (40 mg total) by mouth daily before breakfast. Continue until tapered by GI. 02/05/17  Yes Mikhail, Maryann, DO  psyllium (HYDROCIL/METAMUCIL) 95 % PACK Take 1 packet by mouth daily. 12/29/16  Yes Mikhail, Velta Addison, DO  spironolactone (ALDACTONE) 50 MG tablet Take 3 tablets (150 mg total) by mouth daily. 02/06/17  Yes Mikhail, Velta Addison, DO  traZODone (DESYREL) 50 MG tablet Take  1/2 tablet HS to start Increase to 1-2 tablets HS as needed for sleep 01/26/17  Yes Dara Hoyer, PA-C  thiamine 100 MG tablet Take 1 tablet (100 mg total) by mouth daily. 12/29/16   Cristal Ford, DO    Family History Family History  Problem Relation Age of Onset  . Hyperlipidemia Father   . Heart disease Father     Social History Social History  Substance Use Topics  . Smoking status: Former Smoker    Types: Cigarettes  . Smokeless tobacco: Never Used     Comment: Reports she quit "about 6 months ago"  . Alcohol use Yes     Comment: Last drink: 36 days ago      Allergies   Patient has no known allergies.   Review of Systems Review of Systems  Constitutional: Positive for fatigue.  Cardiovascular: Positive for leg swelling.  All other systems reviewed and are negative.    Physical Exam Updated Vital Signs BP (!) 88/61 (BP Location: Right Arm)   Pulse 73   Temp 98.3 F (36.8 C)   Resp 18   Ht 5\' 6"  (1.676 m)   Wt 46.3 kg (102 lb)   SpO2 97%   BMI 16.46 kg/m   Physical Exam  Constitutional: She is oriented to person, place, and time. She appears well-developed and well-nourished. No distress.  HENT:  Head: Normocephalic and atraumatic.  Eyes: Conjunctivae are normal.  Neck: Neck supple.  Cardiovascular: Regular rhythm and normal heart sounds.  Tachycardia present.  Exam reveals no friction rub.   No murmur heard. Pulmonary/Chest: Effort normal and breath sounds normal. No respiratory distress. She has no wheezes. She has no rales.  Abdominal: She exhibits distension. There is generalized tenderness.  Musculoskeletal: She exhibits edema (severe right-sided, 3+ pitting edema with moderate popliteal TTP).  Neurological: She is alert and oriented to person, place, and time. She exhibits normal muscle tone.  Skin: Skin is warm. Capillary refill takes less than 2 seconds.  Psychiatric: She has a normal mood and affect.  Nursing note and vitals  reviewed.    ED Treatments / Results  Labs (all labs ordered are listed, but only abnormal results are displayed) Labs Reviewed  CBC WITH DIFFERENTIAL/PLATELET - Abnormal; Notable for the following:       Result Value   WBC 43.2 (*)    RBC 2.73 (*)    Hemoglobin 10.0 (*)    HCT 27.9 (*)    MCV 102.2 (*)    MCH 36.6 (*)    RDW 16.3 (*)    Neutro Abs 41.1 (*)    All other components within normal limits  COMPREHENSIVE METABOLIC PANEL - Abnormal; Notable for the following:    Sodium 132 (*)    Chloride 96 (*)    Glucose, Bld 117 (*)    BUN 34 (*)    Calcium 8.3 (*)    Total Protein 5.2 (*)    Albumin 2.4 (*)    AST  92 (*)    ALT 94 (*)    Alkaline Phosphatase 587 (*)    Total Bilirubin 7.9 (*)    All other components within normal limits  BRAIN NATRIURETIC PEPTIDE - Abnormal; Notable for the following:    B Natriuretic Peptide 113.2 (*)    All other components within normal limits  PROTIME-INR - Abnormal; Notable for the following:    Prothrombin Time 18.6 (*)    All other components within normal limits  CULTURE, BLOOD (ROUTINE X 2)  CULTURE, BLOOD (ROUTINE X 2)  APTT  HEPARIN LEVEL (UNFRACTIONATED)  CBC  POC OCCULT BLOOD, ED    EKG  EKG Interpretation None       Radiology Ct Angio Chest Pe W And/or Wo Contrast  Result Date: 02/09/2017 CLINICAL DATA:  Right lower extremity swelling. EXAM: CT ANGIOGRAPHY CHEST WITH CONTRAST TECHNIQUE: Multidetector CT imaging of the chest was performed using the standard protocol during bolus administration of intravenous contrast. Multiplanar CT image reconstructions and MIPs were obtained to evaluate the vascular anatomy. CONTRAST:  100 cc Isovue 370 IV COMPARISON:  Chest radiograph 02/03/2017 FINDINGS: Cardiovascular: There are no filling defects within the pulmonary arteries to suggest pulmonary embolus. Thoracic aorta is normal in caliber with mild atherosclerosis. No aneurysm. The heart is normal in size. There is no  pericardial effusion. Coronary artery calcifications are seen. Mediastinum/Nodes: No mediastinal, hilar, or axillary adenopathy. Questionable thickening of the distal esophagus. Visualized thyroid gland is normal. Trachea is midline and patent. Lungs/Pleura: Small right pleural effusion, likely sympathetic. Minimal adjacent right basilar atelectasis. No left pleural effusion. No pulmonary edema. No consolidation to suggest pneumonia. No pulmonary mass or dominant nodule. Upper Abdomen: Moderate volume of upper abdominal ascites. Musculoskeletal: There are no acute or suspicious osseous abnormalities. Review of the MIP images confirms the above findings. IMPRESSION: 1. No pulmonary embolus. 2. Tiny right pleural effusion, likely sympathetic. Adjacent atelectasis. 3. Mild aortic atherosclerosis and coronary artery calcifications. Aortic Atherosclerosis (ICD10-I70.0). Electronically Signed   By: Jeb Levering M.D.   On: 02/09/2017 22:20    Procedures Procedures (including critical care time)  Medications Ordered in ED Medications  iopamidol (ISOVUE-370) 76 % injection (not administered)  cefTRIAXone (ROCEPHIN) 2 g in dextrose 5 % 50 mL IVPB (not administered)  albumin human 5 % solution 25 g (not administered)  pantoprazole (PROTONIX) injection 40 mg (not administered)  heparin ADULT infusion 100 units/mL (25000 units/2110mL sodium chloride 0.45%) (not administered)  0.9 %  sodium chloride infusion ( Intravenous New Bag/Given 02/09/17 2031)  iopamidol (ISOVUE-370) 76 % injection 100 mL (100 mLs Intravenous Contrast Given 02/09/17 2155)     Initial Impression / Assessment and Plan / ED Course  I have reviewed the triage vital signs and the nursing notes.  Pertinent labs & imaging results that were available during my care of the patient were reviewed by me and considered in my medical decision making (see chart for details).     56 yo F with h/o alcoholic cirrhosis c/b cirrhotic gastropathy and  h/o GIB 2/2 Candidal esophagitis with ulceration here with RLE swelling and pain. DVT study positive for significant DVT near CFV. Lab work shows persistent marked leukocytosis, moderate microcytic anemia, and CMP c/w her chronic decompensated cirrhosis. Hemoccult neg but given her multiple comorbidities, complex coagulation status given cirrhosis with baseline INR 1.5, will consult Hospitalist re: appropriate anticoagulation and possible admission for observation given her h/o recent GI bleeds.  Discussed with [facility] on call Dr. Henrene Pastor, who has  reviewed labs, imaging, and case. He states it is OK for initiation of anticoagulation but pt should be on a PPI and closely monitored for GIB. He is also concerned about her persistent leukocytosis, and lab work, and recommends that pt should also be arranged for IR guided paracentesis in the AM (heparin can be held prior) and GI can see her in the AM, with empiric dose of rocephin/3rd gen abx at this time. Will admit to hospitalists.  Final Clinical Impressions(s) / ED Diagnoses   Final diagnoses:  Acute deep vein thrombosis (DVT) of femoral vein of right lower extremity (HCC)  Abdominal pain, diffuse  Other elevated white blood cell (WBC) count    New Prescriptions New Prescriptions   No medications on file     Duffy Bruce, MD 02/10/17 [phone]

## 2017-02-10 ENCOUNTER — Other Ambulatory Visit (HOSPITAL_COMMUNITY): Payer: 59

## 2017-02-10 ENCOUNTER — Encounter (HOSPITAL_COMMUNITY): Payer: Self-pay | Admitting: Family Medicine

## 2017-02-10 DIAGNOSIS — K7031 Alcoholic cirrhosis of liver with ascites: Secondary | ICD-10-CM

## 2017-02-10 DIAGNOSIS — E43 Unspecified severe protein-calorie malnutrition: Secondary | ICD-10-CM | POA: Diagnosis not present

## 2017-02-10 DIAGNOSIS — K7011 Alcoholic hepatitis with ascites: Secondary | ICD-10-CM | POA: Diagnosis present

## 2017-02-10 DIAGNOSIS — D696 Thrombocytopenia, unspecified: Secondary | ICD-10-CM | POA: Diagnosis present

## 2017-02-10 DIAGNOSIS — K7010 Alcoholic hepatitis without ascites: Secondary | ICD-10-CM

## 2017-02-10 DIAGNOSIS — Z8249 Family history of ischemic heart disease and other diseases of the circulatory system: Secondary | ICD-10-CM | POA: Diagnosis not present

## 2017-02-10 DIAGNOSIS — R109 Unspecified abdominal pain: Secondary | ICD-10-CM | POA: Diagnosis present

## 2017-02-10 DIAGNOSIS — K7030 Alcoholic cirrhosis of liver without ascites: Secondary | ICD-10-CM | POA: Diagnosis not present

## 2017-02-10 DIAGNOSIS — R1084 Generalized abdominal pain: Secondary | ICD-10-CM

## 2017-02-10 DIAGNOSIS — K7040 Alcoholic hepatic failure without coma: Secondary | ICD-10-CM | POA: Diagnosis present

## 2017-02-10 DIAGNOSIS — I82411 Acute embolism and thrombosis of right femoral vein: Principal | ICD-10-CM

## 2017-02-10 DIAGNOSIS — Z87891 Personal history of nicotine dependence: Secondary | ICD-10-CM | POA: Diagnosis not present

## 2017-02-10 DIAGNOSIS — K738 Other chronic hepatitis, not elsewhere classified: Secondary | ICD-10-CM | POA: Diagnosis not present

## 2017-02-10 DIAGNOSIS — I82409 Acute embolism and thrombosis of unspecified deep veins of unspecified lower extremity: Secondary | ICD-10-CM | POA: Diagnosis present

## 2017-02-10 DIAGNOSIS — D72829 Elevated white blood cell count, unspecified: Secondary | ICD-10-CM | POA: Diagnosis present

## 2017-02-10 DIAGNOSIS — Z681 Body mass index (BMI) 19 or less, adult: Secondary | ICD-10-CM | POA: Diagnosis not present

## 2017-02-10 DIAGNOSIS — Z8349 Family history of other endocrine, nutritional and metabolic diseases: Secondary | ICD-10-CM | POA: Diagnosis not present

## 2017-02-10 DIAGNOSIS — E871 Hypo-osmolality and hyponatremia: Secondary | ICD-10-CM | POA: Diagnosis present

## 2017-02-10 DIAGNOSIS — D539 Nutritional anemia, unspecified: Secondary | ICD-10-CM

## 2017-02-10 DIAGNOSIS — Z811 Family history of alcohol abuse and dependence: Secondary | ICD-10-CM | POA: Diagnosis not present

## 2017-02-10 DIAGNOSIS — Z79899 Other long term (current) drug therapy: Secondary | ICD-10-CM | POA: Diagnosis not present

## 2017-02-10 LAB — CBC
HCT: 26 % — ABNORMAL LOW (ref 36.0–46.0)
HEMOGLOBIN: 9.3 g/dL — AB (ref 12.0–15.0)
MCH: 36.6 pg — ABNORMAL HIGH (ref 26.0–34.0)
MCHC: 35.8 g/dL (ref 30.0–36.0)
MCV: 102.4 fL — ABNORMAL HIGH (ref 78.0–100.0)
PLATELETS: 169 10*3/uL (ref 150–400)
RBC: 2.54 MIL/uL — ABNORMAL LOW (ref 3.87–5.11)
RDW: 16.7 % — ABNORMAL HIGH (ref 11.5–15.5)
WBC: 37.4 10*3/uL — ABNORMAL HIGH (ref 4.0–10.5)

## 2017-02-10 LAB — HEPARIN LEVEL (UNFRACTIONATED): Heparin Unfractionated: 0.2 IU/mL — ABNORMAL LOW (ref 0.30–0.70)

## 2017-02-10 LAB — LACTATE DEHYDROGENASE: LDH: 122 U/L (ref 98–192)

## 2017-02-10 LAB — PROTEIN, TOTAL: Total Protein: 4.9 g/dL — ABNORMAL LOW (ref 6.5–8.1)

## 2017-02-10 MED ORDER — SPIRONOLACTONE 50 MG PO TABS
150.0000 mg | ORAL_TABLET | Freq: Every day | ORAL | Status: DC
  Administered 2017-02-10 – 2017-02-13 (×4): 150 mg via ORAL
  Filled 2017-02-10: qty 3
  Filled 2017-02-10: qty 6
  Filled 2017-02-10: qty 3
  Filled 2017-02-10: qty 6
  Filled 2017-02-10: qty 3
  Filled 2017-02-10: qty 6
  Filled 2017-02-10 (×2): qty 3

## 2017-02-10 MED ORDER — FUROSEMIDE 40 MG PO TABS
60.0000 mg | ORAL_TABLET | Freq: Every day | ORAL | Status: DC
  Administered 2017-02-10 – 2017-02-13 (×4): 60 mg via ORAL
  Filled 2017-02-10 (×4): qty 1

## 2017-02-10 MED ORDER — TRAMADOL HCL 50 MG PO TABS
50.0000 mg | ORAL_TABLET | Freq: Four times a day (QID) | ORAL | Status: DC
  Administered 2017-02-10 – 2017-02-13 (×12): 50 mg via ORAL
  Filled 2017-02-10 (×12): qty 1

## 2017-02-10 MED ORDER — ENSURE ENLIVE PO LIQD
237.0000 mL | Freq: Three times a day (TID) | ORAL | Status: DC
  Administered 2017-02-10 – 2017-02-13 (×10): 237 mL via ORAL

## 2017-02-10 MED ORDER — ALBUMIN HUMAN 5 % IV SOLN
25.0000 g | Freq: Once | INTRAVENOUS | Status: AC
  Administered 2017-02-10: 25 g via INTRAVENOUS
  Filled 2017-02-10: qty 500

## 2017-02-10 MED ORDER — TRAMADOL HCL 50 MG PO TABS
50.0000 mg | ORAL_TABLET | Freq: Four times a day (QID) | ORAL | Status: DC | PRN
  Administered 2017-02-10 (×2): 50 mg via ORAL
  Filled 2017-02-10 (×2): qty 1

## 2017-02-10 MED ORDER — TRAZODONE HCL 50 MG PO TABS
25.0000 mg | ORAL_TABLET | Freq: Every evening | ORAL | Status: DC | PRN
  Administered 2017-02-11 – 2017-02-12 (×3): 25 mg via ORAL
  Filled 2017-02-10 (×3): qty 1

## 2017-02-10 MED ORDER — PANTOPRAZOLE SODIUM 40 MG IV SOLR
40.0000 mg | Freq: Once | INTRAVENOUS | Status: AC
  Administered 2017-02-10: 40 mg via INTRAVENOUS
  Filled 2017-02-10: qty 40

## 2017-02-10 MED ORDER — PREDNISOLONE 15 MG/5ML PO SOLN
40.0000 mg | Freq: Every day | ORAL | Status: DC
  Administered 2017-02-10 – 2017-02-13 (×4): 40 mg via ORAL
  Filled 2017-02-10 (×4): qty 15

## 2017-02-10 MED ORDER — LACTULOSE 10 GM/15ML PO SOLN
20.0000 g | Freq: Two times a day (BID) | ORAL | Status: DC
  Administered 2017-02-10 – 2017-02-13 (×8): 20 g via ORAL
  Filled 2017-02-10 (×8): qty 30

## 2017-02-10 MED ORDER — FOLIC ACID 1 MG PO TABS
1.0000 mg | ORAL_TABLET | Freq: Every day | ORAL | Status: DC
  Administered 2017-02-10 – 2017-02-13 (×4): 1 mg via ORAL
  Filled 2017-02-10 (×4): qty 1

## 2017-02-10 MED ORDER — VITAMIN B-1 100 MG PO TABS
100.0000 mg | ORAL_TABLET | Freq: Every day | ORAL | Status: DC
  Administered 2017-02-10 – 2017-02-13 (×4): 100 mg via ORAL
  Filled 2017-02-10 (×4): qty 1

## 2017-02-10 MED ORDER — PANTOPRAZOLE SODIUM 40 MG IV SOLR
40.0000 mg | Freq: Two times a day (BID) | INTRAVENOUS | Status: DC
  Administered 2017-02-10 – 2017-02-11 (×4): 40 mg via INTRAVENOUS
  Filled 2017-02-10 (×4): qty 40

## 2017-02-10 MED ORDER — HEPARIN (PORCINE) IN NACL 100-0.45 UNIT/ML-% IJ SOLN
1200.0000 [IU]/h | INTRAMUSCULAR | Status: DC
  Filled 2017-02-10: qty 250

## 2017-02-10 MED ORDER — HEPARIN (PORCINE) IN NACL 100-0.45 UNIT/ML-% IJ SOLN
1250.0000 [IU]/h | INTRAMUSCULAR | Status: DC
  Administered 2017-02-11: 1200 [IU]/h via INTRAVENOUS
  Filled 2017-02-10 (×3): qty 250

## 2017-02-10 MED ORDER — CEFTRIAXONE SODIUM 2 G IJ SOLR
2.0000 g | INTRAMUSCULAR | Status: DC
  Administered 2017-02-10 – 2017-02-12 (×4): 2 g via INTRAVENOUS
  Filled 2017-02-10 (×5): qty 2

## 2017-02-10 MED ORDER — HEPARIN (PORCINE) IN NACL 100-0.45 UNIT/ML-% IJ SOLN
900.0000 [IU]/h | INTRAMUSCULAR | Status: DC
  Administered 2017-02-10: 900 [IU]/h via INTRAVENOUS
  Filled 2017-02-10: qty 250

## 2017-02-10 MED ORDER — PRO-STAT SUGAR FREE PO LIQD
30.0000 mL | Freq: Two times a day (BID) | ORAL | Status: DC
  Administered 2017-02-10 – 2017-02-13 (×7): 30 mL via ORAL
  Filled 2017-02-10 (×7): qty 30

## 2017-02-10 MED ORDER — DOCUSATE SODIUM 100 MG PO CAPS
100.0000 mg | ORAL_CAPSULE | Freq: Two times a day (BID) | ORAL | Status: DC
  Administered 2017-02-10: 100 mg via ORAL
  Filled 2017-02-10: qty 1

## 2017-02-10 NOTE — Consult Note (Signed)
Consultation  Referring Provider: Naomie Dean MD Primary Care Physician:  [place], Betty G, MD Primary Gastroenterologist:  Dr. Zenovia Jarred  Reason for Consultation:  Cirrhosis management  HPI: Mercedes Mitchell is a 56 y.o. female known to Dr. Hilarie Fredrickson recently, with diagnosis of decompensated alcoholic liver disease and severe alcoholic hepatitis. She was seen in consultation during last hospitalization 6/10 through 02/05/2017, at that time with severe alcoholic hepatitis with a MELD of 27 and was started on prednisolone.. She did have paracentesis done with removal of 5.5 L, and cell counts were not consistent with SBP. She had stopped drinking a little over a month ago. She also has severe malnutrition. EGD was done on 12/25/2016 with finding of erosive esophagitis and candidiasis. There were no varices. Patient has been on 60 of Lasix daily and 150 mg of Aldactone at home daily. She has also been started on lactulose for mild encephalopathy. Patient readmitted early today after presented to the ER with complaints of three-day history of progressive right lower extremity pain and swelling. Right lower extremity Doppler shows a DVT in the right femoral vein to just distal to the saphenofemoral femoral vein. She has been started on heparin area  Patient and husband does endorse that she has been compliant with medications, and has not had any alcohol in the past 48 days. Her appetite is not good but she has been trying to eat and has been drinking and Ensure 3 times daily. She has had reaccumulation of acidic fluid but is not tensely distended and is not particularly complaining of ascites. Her primary concern today is pain control for the right lower extremity pain. Labs have been reviewed and she has had some improvement in parameters. T bili down to 7.9, PT=18.6, INR=1.54, WBC elevated at 43.2 but on steroids. Current MELD down to 19 with MELD sodium of 23.    Past Medical  History:  Diagnosis Date  . 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  paracentesis 2.6 liters 5/4: no SBP.    Marland Kitchen Alcoholism ([place]) 12/2016  . Dysphagia 2011  . Macrocytic anemia 12/2016  . Malnutrition ([place]) 12/2016  . Panic type anxiety neurosis 2011    Past Surgical History:  Procedure Laterality Date  . ESOPHAGOGASTRODUODENOSCOPY N/A 12/25/2016   Procedure: ESOPHAGOGASTRODUODENOSCOPY (EGD);  Surgeon: Jerene Bears, MD;  Location: [facility];  Service: Endoscopy;  Laterality: N/A;  . IR PARACENTESIS  02/02/2017  . IR TRANSCATHETER BX  02/02/2017  . IR US GUIDE VASC ACCESS RIGHT  02/02/2017  . IR VENOGRAM HEPATIC W HEMODYNAMIC EVALUATION  02/02/2017    Prior to Admission medications   Medication Sig Start Date End Date Taking? Authorizing Provider  bacitracin ointment Apply 1 application topically 3 (three) times daily. 01/26/17  Yes Waynetta Pean, PA-C  camphor-menthol [facility]) lotion Apply topically as needed for itching. 02/05/17  Yes Mikhail, Velta Addison, DO  docusate sodium (COLACE) 100 MG capsule Take 1 capsule (100 mg total) by mouth 2 (two) times daily. 02/05/17  Yes Mikhail, [place], DO  feeding supplement, ENSURE ENLIVE, (ENSURE ENLIVE) LIQD Take 237 mLs by mouth 3 (three) times daily between meals. 12/28/16  Yes Mikhail, Velta Addison, DO  folic acid (FOLVITE) 1 MG tablet Take 1 tablet (1 mg total) by mouth daily. 12/29/16  Yes Mikhail, Maryann, DO  furosemide (LASIX) 20 MG tablet Take 3 tablets (60 mg total) by mouth daily. 02/06/17  Yes Mikhail, Velta Addison, DO  lactulose (CHRONULAC) 10 GM/15ML solution Take 30 mLs (20 g total) by mouth 2 (two) times daily. 02/05/17  Yes Mikhail, [place], DO  Multiple Vitamin (MULTIVITAMIN WITH MINERALS) TABS tablet Take 1 tablet by mouth daily. 12/29/16  Yes Mikhail, Maryann, DO  pantoprazole (PROTONIX) 40 MG tablet Take 1 tablet (40 mg total) by mouth 2 (two) times daily. 12/28/16  Yes Mikhail, Velta Addison,  DO  prednisoLONE (PRELONE) 15 MG/5ML SOLN Take 13.3 mLs (40 mg total) by mouth daily before breakfast. Continue until tapered by GI. 02/05/17  Yes Mikhail, Maryann, DO  psyllium (HYDROCIL/METAMUCIL) 95 % PACK Take 1 packet by mouth daily. 12/29/16  Yes Mikhail, Velta Addison, DO  spironolactone (ALDACTONE) 50 MG tablet Take 3 tablets (150 mg total) by mouth daily. 02/06/17  Yes Mikhail, Velta Addison, DO  traZODone (DESYREL) 50 MG tablet Take 1/2 tablet HS to start Increase to 1-2 tablets HS as needed for sleep 01/26/17  Yes Dara Hoyer, PA-C  thiamine 100 MG tablet Take 1 tablet (100 mg total) by mouth daily. 12/29/16   Cristal Ford, DO    Current Facility-Administered Medications  Medication Dose Route Frequency Provider Last Rate Last Dose  . cefTRIAXone (ROCEPHIN) 2 g in dextrose 5 % 50 mL IVPB  2 g Intravenous Q24H Duffy Bruce, MD   Stopped at 02/10/17 0300  . docusate sodium (COLACE) capsule 100 mg  100 mg Oral BID Danford, Suann Larry, MD      . feeding supplement (ENSURE ENLIVE) (ENSURE ENLIVE) liquid 237 mL  237 mL Oral TID BM Danford, Suann Larry, MD      . feeding supplement (PRO-STAT SUGAR FREE 64) liquid 30 mL  30 mL Oral BID Alekh, Kshitiz, MD      . folic acid (FOLVITE) tablet 1 mg  1 mg Oral Daily Danford, Suann Larry, MD      . furosemide (LASIX) tablet 60 mg  60 mg Oral Daily Danford, Christopher P, MD      . heparin ADULT infusion 100 units/mL (25000 units/259mL sodium chloride 0.45%)  900 Units/hr Intravenous Continuous Edwin Dada, MD 9 mL/hr at 02/10/17 0138 900 Units/hr at 02/10/17 0138  . lactulose (CHRONULAC) 10 GM/15ML solution 20 g  20 g Oral BID Edwin Dada, MD   20 g at 02/10/17 0313  . pantoprazole (PROTONIX) injection 40 mg  40 mg Intravenous Q12H Danford, Suann Larry, MD      . prednisoLONE (PRELONE) 15 MG/5ML SOLN 40 mg  40 mg Oral QAC breakfast Edwin Dada, MD   40 mg at 02/10/17 0751  . spironolactone (ALDACTONE) tablet 150 mg   150 mg Oral Daily Danford, Suann Larry, MD      . thiamine (VITAMIN B-1) tablet 100 mg  100 mg Oral Daily Danford, Suann Larry, MD      . traZODone (DESYREL) tablet 25 mg  25 mg Oral QHS PRN Edwin Dada, MD        Allergies as of 02/09/2017  . (No Known Allergies)    Family History  Problem Relation Age of Onset  . Hyperlipidemia Father   . Heart disease Father   . Alcoholism Father   . Heart attack Father   . Alcoholism Mother   . Alcoholism Sister     Social History   Social History  . Marital status: Married    Spouse name: N/A  . Number of children: N/A  . Years of education: N/A   Occupational History  . Unemployed    Social History  Main Topics  . Smoking status: Former Smoker    Types: Cigarettes  . Smokeless tobacco: Never Used     Comment: Reports she quit "about 6 months ago"  . Alcohol use Yes     Comment: Last drink: 36 days ago   . Drug use: No     Comment: Last use Cocaine 11/17, reports last use "over a year", [facility] 2/18, reports "a year" ago  . Sexual activity: Not on file   Other Topics Concern  . Not on file   Social History Narrative   Married, children, not working    Review of Systems: Pertinent positive and negative review of systems were noted in the above HPI section.  All other review of systems was otherwise negative.  Physical Exam: Vital signs in last 24 hours: Temp:  [97.6 F (36.4 C)-98.3 F (36.8 C)] 98 F (36.7 C) (06/21 0805) Pulse Rate:  [73-116] 88 (06/21 0805) Resp:  [14-18] 16 (06/21 0805) BP: (87-106)/(60-67) 98/64 (06/21 0805) SpO2:  [97 %-100 %] 100 % (06/21 0805) Weight:  [102 lb (46.3 kg)-103 lb 9.9 oz (47 kg)] 103 lb 9.9 oz (47 kg) (06/21 0156)   General:   Alert,  Well-developed, Emaciated appearing chronically ill white female who appears significantly older than stated age. She has multiple ecchymoses over her extremities Head:  Normocephalic and atraumatic. Eyes:  Sclera icteric  Conjunctiva  pink. Ears:  Normal auditory acuity. Nose:  No deformity, discharge,  or lesions. Mouth:  No deformity or lesions.   Neck:  Supple; no masses or thyromegaly. Lungs:  Decreased breath sounds bilaterally Heart:  Regular rate and rhythm; no murmurs, clicks, rubs,  or gallops. Abdomen:  Soft, significant non-tense ascites, no focal tenderness, bowel sounds are present, no palpable mass Rectal:  Deferred  Msk: Right lower extremity edematous and larger than left. Pulses:  Normal pulses noted.  Neurologic:  Alert and  oriented x3  grossly normal neurologically-mentation slow, no asterixis Skin:  Thin, fragile, appearing with multiple ecchymoses. Psych:  Alert and cooperative. Normal mood and affect.  Intake/Output from previous day: 06/20 0701 - 06/21 0700 In: 80.3 [I.V.:30.3; IV Piggyback:50] Out: 350 [Urine:350] Intake/Output this shift: No intake/output data recorded.  Lab Results:  Recent Labs  02/09/17 1823  WBC 43.2*  HGB 10.0*  HCT 27.9*  PLT 181   BMET  Recent Labs  02/09/17 1823  NA 132*  K 5.0  CL 96*  CO2 27  GLUCOSE 117*  BUN 34*  CREATININE 0.78  CALCIUM 8.3*   LFT  Recent Labs  02/09/17 1823  PROT 5.2*  ALBUMIN 2.4*  AST 92*  ALT 94*  ALKPHOS 587*  BILITOT 7.9*   PT/INR  Recent Labs  02/09/17 2031  LABPROT 18.6*  INR 1.54   Hepatitis Panel No results for input(s): HEPBSAG, HCVAB, HEPAIGM, HEPBIGM in the last 72 hours.    IMPRESSION:  #32 56 year old white female with severe decompensated alcoholic liver disease and severe alcoholic hepatitis-currently on a 30 day course of prednisolone-day #10 She has had improvement in hepatic parameters and MELD score down to 19 #2 new right lower extremity DVT-and associated significant pain #3 recent diagnosis of erosive esophagitis and candidiasis #4 malnutrition #5 anemia stable #6 hepatic encephalopathy stable #7 ascites-non-tense currently, on diuretics  PLAN:  Continue heparin  short-term, in event paracentesis needs to be done She's having significant pain today from her leg, her ascites is non-tense and do not feel that it is imperative that she has  paracentesis. This can be done at some point prior to discharge if needed. Continue current doses of diuretics with Lasix 60 by mouth every morning and Aldactone 150 mg by mouth daily Ceftriaxone has been started for prophylaxis Continue lactulose 30 mL twice daily Continue prednisolone 40 mg by mouth daily to complete a 30 day course, now at day 10 Continue pantoprazole 40 mg bid in hospital and pantoprazole 40 mg po qam at discharge Continue and Ensure 3 times daily and add additional protein supplements Ordered tramadol 50 mg every 6 hours when necessary for pain  Amy Esterwood  02/10/2017, 10:09 AM      Attending physician's note   I have taken a history, examined the patient and reviewed the chart. I agree with the Advanced Practitioner's note, impression and recommendations.  Alcoholic cirrhosis with ascites and alcoholic hepatitis on prednisolone. Admitted with RLE DVT and placed on IV heparin. GERD with erosive esophagitis. Ascites is significant but not tense and we can observe for now. If ascites does not improve or worsens can proceed with LV paracentesis with albumin infusion. If ascites improves we can avoid paracentesis. GI recommended medications as outlined above. Outpatient GI follow up with Dr. Hilarie Fredrickson. GI available if needed, please call for questions.  Lucio Edward, MD [facility] [phone] Mon-Fri 8a-5p [phone] after 5p, weekends, holidays

## 2017-02-10 NOTE — ED Notes (Signed)
Report given to [place], [place]

## 2017-02-10 NOTE — Progress Notes (Signed)
Mercedes Mitchell is a 56 y.o. female patient. CD-IOP: Excused absence. The patient is excused from group today. After speaking with her earlier today, we have decided to allow her to re-enter the program on Monday, June 25. She has been informed that she needs to get on the liver transplant list and will need to complete a SA treatment program. We will look forward to the patient joining [place] next week.         Brandon Melnick, LCAS

## 2017-02-10 NOTE — Progress Notes (Signed)
Initial Nutrition Assessment  DOCUMENTATION CODES:   Severe malnutrition in context of chronic illness, Underweight  INTERVENTION:   Continue Ensure Enlive po TID, each supplement provides 350 kcal and 20 grams of protein Provide Prostat liquid protein PO 30 ml BID with meals, each supplement provides 100 kcal, 15 grams protein. RD to continue to monitor  NUTRITION DIAGNOSIS:   Malnutrition (Severe) related to chronic illness, wound healing (ETOH cirrhosis) as evidenced by severe depletion of body fat, severe depletion of muscle mass, percent weight loss.  GOAL:   Patient will meet greater than or equal to 90% of their needs  MONITOR:   PO intake, Supplement acceptance, Labs, Weight trends, I & O's  REASON FOR ASSESSMENT:   Malnutrition Screening Tool    ASSESSMENT:   56 y.o. 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  Patient familiar to RD from previous admission, just last week. At that time, pt was found to have severe malnutrition and was provided low sodium diet education.  Pt readmitted with DVT in leg. Appetite continues to be variable but she does eat and drinks Ensures at home. RD has added Prostat supplements to provide additional protein.   Per chart review, pt has lost 11 lb since 6/6, 10% wt loss x 2 weeks, significant for time frame. This weight loss may be related to recent fluid losses as well. Nutrition-Focused physical exam completed. Findings are severe fat depletion, severe muscle depletion, and moderate edema.   Medications: Folic acid tablet daily, Lasix tablet daily, IV Protonix BID, Thiamine tablet Labs reviewed: Low Na  Diet Order:  Diet regular Room service appropriate? Yes; Fluid consistency: Thin  Skin:  Wound (see comment) (Stage II pressure injury: coccyx)  Last BM:  6/20  Height:   Ht Readings from Last 1 Encounters:   02/10/17 5\' 6"  (1.676 m)    Weight:   Wt Readings from Last 1 Encounters:  02/10/17 103 lb 9.9 oz (47 kg)    Ideal Body Weight:  59.1 kg  BMI:  Body mass index is 16.72 kg/m.  Estimated Nutritional Needs:   Kcal:  1550-1750  Protein:  75-85g  Fluid:  1.8L/day  EDUCATION NEEDS:   No education needs identified at this time  Clayton Bibles, MS, RD, LDN Pager: [phone] After Hours Pager: [phone]

## 2017-02-10 NOTE — Progress Notes (Signed)
Clarified cardiac monitoring order with Dr. Starla Link via phone. Md doesn't want telemetry at this time. Order discontinued.

## 2017-02-10 NOTE — Progress Notes (Signed)
ANTICOAGULATION CONSULT NOTE - follow-up  Pharmacy Consult for Heparin Indication: DVT  No Known Allergies  Patient Measurements: Height: 5\' 6"  (167.6 cm) Weight: 103 lb 9.9 oz (47 kg) IBW/kg (Calculated) : 59.3 Heparin Dosing Weight:   Vital Signs: Temp: 98 F (36.7 C) (06/21 0805) Temp Source: Oral (06/21 0805) BP: 98/64 (06/21 0805) Pulse Rate: 88 (06/21 0805)  Labs:  Recent Labs  02/09/17 1823 02/09/17 2031 02/10/17 1011  HGB 10.0*  --  9.3*  HCT 27.9*  --  26.0*  PLT 181  --  169  APTT  --  34  --   LABPROT  --  18.6*  --   INR  --  1.54  --   HEPARINUNFRC  --   --  <0.10*  CREATININE 0.78  --   --     Estimated Creatinine Clearance: 59.6 mL/min (by C-G formula based on SCr of 0.78 mg/dL).   Medical History: Past Medical History:  Diagnosis Date  . 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  paracentesis 2.6 liters 5/4: no SBP.    Marland Kitchen Alcoholism ([place]) 12/2016  . Dysphagia 2011  . Macrocytic anemia 12/2016  . Malnutrition ([place]) 12/2016  . Panic type anxiety neurosis 2011    Medications:  Infusions:  . cefTRIAXone (ROCEPHIN)  IV Stopped (02/10/17 0300)  . heparin 900 Units/hr (02/10/17 0138)    Assessment: 24 YOF with alcoholic cirrhosis presents with RLE swelling due to acute DVT, pharmacy asked to dose heparin pending possible paracentesis. Recent EGD revealed erosive esophagitis and mild gastropathy but no varices seen.   Today,02/10/2017   Heparin level - initial level undetectable  NOTE: patients with cirrhosis have reduced antithrombin levels that results in decreased anti-Xa levels.  This reduction is in proportion to severity of cirrhosis  CBC: anemia - Hgb consistent with previous results, pltc WNL  Renal: SCr WNL  Baseline INR: 1.72 (6/15), 1.54 (6/20)  Calculate a Child-Pugh Score = 11 (= Class C)  Goal of Therapy:  Heparin level 0.3-0.7 units/ml, preferably  0.3-0.5 Monitor platelets by anticoagulation protocol: Yes   Plan:   Increase heparin rate to 1050 units/hr  Will not bolus d/t bleeding risk of severe cirrhosis  Check heparin level in ~6h  Cirrhosis decreases anti-Xa levels so shoot for lower end of heparin level goal  Daily heparin level and CBC  Await plan for long-term anticoagulation, difficult situation with severe cirrhosis  DOACs have limited data in this population (mfg recommends avoiding in Child-Pugh Class B or C)  Warfarin more difficult to manage with baseline INR elevation  LMWH are generally preferred (? Insurance will cover)    Doreene Eland, PharmD, BCPS.   Pager: [phone] 02/10/2017 11:34 AM

## 2017-02-10 NOTE — Progress Notes (Signed)
Spoke with patient and spouse at bedside regarding [facility] services. They state patient is active with [facility] for PT/OT. They have requested to add an aide. Contacted [facility] for referral, requested resumption orders and addition of aide from attending. Offered private duty list to husband for hiring extra assistance, he declined, states does not have financial resources to pay out of pocket. Will continue to follow for [facility] needs.

## 2017-02-10 NOTE — Progress Notes (Signed)
ANTICOAGULATION CONSULT NOTE - follow-up  Pharmacy Consult for Heparin Indication: DVT  No Known Allergies  Patient Measurements: Height: 5\' 6"  (167.6 cm) Weight: 103 lb 9.9 oz (47 kg) IBW/kg (Calculated) : 59.3 Heparin Dosing Weight:   Vital Signs: Temp: 98 F (36.7 C) (06/21 1440) Temp Source: Oral (06/21 1440) BP: 107/69 (06/21 1440) Pulse Rate: 84 (06/21 1440)  Labs:  Recent Labs  02/09/17 1823 02/09/17 2031 02/10/17 1011 02/10/17 1804  HGB 10.0*  --  9.3*  --   HCT 27.9*  --  26.0*  --   PLT 181  --  169  --   APTT  --  34  --   --   LABPROT  --  18.6*  --   --   INR  --  1.54  --   --   HEPARINUNFRC  --   --  <0.10* 0.20*  CREATININE 0.78  --   --   --     Estimated Creatinine Clearance: 59.6 mL/min (by C-G formula based on SCr of 0.78 mg/dL).   Medical History: Past Medical History:  Diagnosis Date  . 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  paracentesis 2.6 liters 5/4: no SBP.    Marland Kitchen Alcoholism ([place]) 12/2016  . Dysphagia 2011  . Macrocytic anemia 12/2016  . Malnutrition ([place]) 12/2016  . Panic type anxiety neurosis 2011    Medications:  Infusions:  . cefTRIAXone (ROCEPHIN)  IV Stopped (02/10/17 0300)  . heparin 1,050 Units/hr (02/10/17 1215)    Assessment: 43 YOF with alcoholic cirrhosis presents with RLE swelling due to acute DVT, pharmacy asked to dose heparin pending possible paracentesis. Recent EGD revealed erosive esophagitis and mild gastropathy but no varices seen.   Today,02/10/2017   Heparin level subtherapeutic but rising since dose increase of IV heparin from 900 to 1050 units/hr  NOTE: patients with cirrhosis have reduced antithrombin levels that results in decreased anti-Xa levels.  This reduction is in proportion to severity of cirrhosis  CBC: anemia - Hgb consistent with previous results, pltc WNL  Per RN, no reported bleeding  Renal: SCr WNL  Baseline INR:  1.72 (6/15), 1.54 (6/20)  Calculate a Child-Pugh Score = 11 (= Class C)  Goal of Therapy:  Heparin level 0.3-0.7 units/ml, preferably 0.3-0.5 Monitor platelets by anticoagulation protocol: Yes   Plan:   Increase heparin rate to 1200 units/hr  Will not bolus d/t bleeding risk of severe cirrhosis  Recheck heparin level in ~6h  Cirrhosis decreases anti-Xa levels so shoot for lower end of heparin level goal  Daily heparin level and CBC  Await plan for long-term anticoagulation, difficult situation with severe cirrhosis  DOACs have limited data in this population (mfg recommends avoiding in Child-Pugh Class B or C)  Warfarin more difficult to manage with baseline INR elevation  LMWH are generally preferred (? Insurance will cover)    Adrian Saran, PharmD, BCPS Pager [phone] 02/10/2017 7:05 PM

## 2017-02-10 NOTE — H&P (Signed)
History and Physical  Patient Name: Mercedes Mitchell     VPX:[id number]    DOB: May 13, 1961    DOA: 02/09/2017 PCP: [place], Betty G, MD  Patient coming from: PCP's office  Chief Complaint: Leg swelling      HPI: Mercedes Mitchell is a 56 y.o. 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  The patient was admitted with acute liver failure in early May of this year, evaluated by [facility] and started on prednisolone.  Since then has been okay, until about a week ago when she was admited with abdominal pain and ascites, had a therapeutic paracentesis and liver biopsy.  Tap showed no cell count elevation, was not treated with antibiotics.  Since discharge three days ago, she has been home recuperating.  To me, she endorses some mild LEFT leg swelilng in the hospital, which worsened when she got home then spread to the RIGHT leg.  She went to her PCP today who was concerned about DVT and sent her to the ER.  ED course: -Afebrile, heart rate 108, respirations 16, BP 87/60 -Na 132, K 5.0, Cr 0.78 (baseline 0.4), WBC 43K, Hgb 10 and macrocytic and stable -AST/ALT 92/94, and TBili 7.9, trending down -INR 1.5, platelets 181K -BNP normal -FOBT negative -CTA chest showed no PE -RLE doppler showed DVT from the mid femoral vein through the common femoral vein just distal to the saphenofemoral junction -To the EDP, she complained of abdominal pain and nausea -The case was discussed with Gastroenterology, who recommended heparin so that it could be interrupted for paracentesis, PPI with heparin, as well as US paracentesis tomorrow, Ceftriaxone empirically for SBP -She was given albumin, heparin and CTX and TRH were asked to admit             ROS: Review of Systems  Constitutional: Positive for malaise/fatigue. Negative for chills and fever.  Gastrointestinal: Positive for nausea.  Negative for abdominal pain.  Neurological: Positive for dizziness, tingling, weakness and headaches.  All other systems reviewed and are negative.         Past Medical History:  Diagnosis Date  . 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  paracentesis 2.6 liters 5/4: no SBP.    Marland Kitchen Alcoholism ([place]) 12/2016  . Dysphagia 2011  . Macrocytic anemia 12/2016  . Malnutrition ([place]) 12/2016  . Panic type anxiety neurosis 2011    Past Surgical History:  Procedure Laterality Date  . ESOPHAGOGASTRODUODENOSCOPY N/A 12/25/2016   Procedure: ESOPHAGOGASTRODUODENOSCOPY (EGD);  Surgeon: Jerene Bears, MD;  Location: [facility];  Service: Endoscopy;  Laterality: N/A;  . IR PARACENTESIS  02/02/2017  . IR TRANSCATHETER BX  02/02/2017  . IR US GUIDE VASC ACCESS RIGHT  02/02/2017  . IR VENOGRAM HEPATIC W HEMODYNAMIC EVALUATION  02/02/2017    Social History: Patient lives with her husband.  The patient walks unassisted.  Former occasional smoker.  From [place].  Worked for [employer].  No Known Allergies  Family history: family history includes Alcoholism in her father, mother, and sister; Heart attack in her father; Heart disease in her father; Hyperlipidemia in her father.  Prior to Admission medications   Medication Sig Start Date End Date Taking? Authorizing Provider  bacitracin ointment Apply 1 application topically 3 (three) times daily. 01/26/17  Yes Waynetta Pean, PA-C  camphor-menthol [facility]) lotion Apply topically  as needed for itching. 02/05/17  Yes Mikhail, Velta Addison, DO  docusate sodium (COLACE) 100 MG capsule Take 1 capsule (100 mg total) by mouth 2 (two) times daily. 02/05/17  Yes Mikhail, [place], DO  feeding supplement, ENSURE ENLIVE, (ENSURE ENLIVE) LIQD Take 237 mLs by mouth 3 (three) times daily between meals. 12/28/16  Yes Mikhail, Velta Addison, DO  folic acid (FOLVITE) 1 MG tablet Take 1 tablet (1 mg total) by mouth daily.  12/29/16  Yes Mikhail, Maryann, DO  furosemide (LASIX) 20 MG tablet Take 3 tablets (60 mg total) by mouth daily. 02/06/17  Yes Mikhail, Maryann, DO  lactulose (CHRONULAC) 10 GM/15ML solution Take 30 mLs (20 g total) by mouth 2 (two) times daily. 02/05/17  Yes Mikhail, [place], DO  Multiple Vitamin (MULTIVITAMIN WITH MINERALS) TABS tablet Take 1 tablet by mouth daily. 12/29/16  Yes Mikhail, Maryann, DO  pantoprazole (PROTONIX) 40 MG tablet Take 1 tablet (40 mg total) by mouth 2 (two) times daily. 12/28/16  Yes Mikhail, Velta Addison, DO  prednisoLONE (PRELONE) 15 MG/5ML SOLN Take 13.3 mLs (40 mg total) by mouth daily before breakfast. Continue until tapered by GI. 02/05/17  Yes Mikhail, Maryann, DO  psyllium (HYDROCIL/METAMUCIL) 95 % PACK Take 1 packet by mouth daily. 12/29/16  Yes Mikhail, Velta Addison, DO  spironolactone (ALDACTONE) 50 MG tablet Take 3 tablets (150 mg total) by mouth daily. 02/06/17  Yes Mikhail, Velta Addison, DO  traZODone (DESYREL) 50 MG tablet Take 1/2 tablet HS to start Increase to 1-2 tablets HS as needed for sleep 01/26/17  Yes Dara Hoyer, PA-C  thiamine 100 MG tablet Take 1 tablet (100 mg total) by mouth daily. 12/29/16   Cristal Ford, DO       Physical Exam: BP (!) 88/61 (BP Location: Right Arm)   Pulse 73   Temp 98.3 F (36.8 C)   Resp 18   Ht 5\' 6"  (1.676 m)   Wt 46.3 kg (102 lb)   SpO2 97%   BMI 16.46 kg/m  General appearance: Very frail adult female, awake but somewhat disoriented, no distress.   Eyes: Icteric, conjunctiva pink, lids and lashes normal. PERRL.    ENT: No nasal deformity, discharge, epistaxis.  Hearing normal. OP moist without lesions.   Neck: No neck masses.  Trachea midline.  No thyromegaly/tenderness. Lymph: No cervical or supraclavicular lymphadenopathy. Skin: Warm and dry.  Jaundice.  Bruises all over body, scab on forehead. Cardiac: RRR, nl S1-S2, no murmurs appreciated.  Capillary refill is brisk.   Respiratory: Normal respiratory rate and rhythm.  CTAB  without rales or wheezes. Abdomen: Abdomen soft.  No ascites.  Hepatomegaly noted.  No TTP.  MSK: No deformities or effusions.  No cyanosis or clubbing.  Severe loss of subQ fat and muscle. Neuro: Cranial nerves normal.  Sensation intact to light touch. Speech is fluent.  Muscle strength  Globally very weak.   No asterixis. Psych: Sensorium intact and responding to questions, oriented to place and year, but uses frequent malapropisms.  Seems to be confused about things that we talk about later in the conversation, forgetful.  Judgment and insight appear mpaired.     Labs on Admission:  I have personally reviewed following labs and imaging studies: CBC:  Recent Labs Lab 02/03/17 0759 02/04/17 0554 02/05/17 0526 02/09/17 1823  WBC 45.5* 39.1* 40.5* 43.2*  NEUTROABS  --   --   --  41.1*  HGB 11.5* 9.4* 10.0* 10.0*  HCT 33.2* 26.4* 27.9* 27.9*  MCV 104.7* 103.1* 102.2* 102.2*  PLT  182 158 165 226   Basic Metabolic Panel:  Recent Labs Lab 02/03/17 0759 02/03/17 1411 02/04/17 0554 02/05/17 0526 02/09/17 1823  NA 131*  --  131* 130* 132*  K 5.1  --  4.0 4.6 5.0  CL 97*  --  96* 95* 96*  CO2 25  --  26 25 27   GLUCOSE 128*  --  112* 119* 117*  BUN 26*  --  25* 25* 34*  CREATININE 0.48  --  0.44 0.43* 0.78  CALCIUM 8.8*  --  8.3* 8.4* 8.3*  MG  --  1.6*  --  1.9  --    GFR: Estimated Creatinine Clearance: 58.8 mL/min (by C-G formula based on SCr of 0.78 mg/dL).  Liver Function Tests:  Recent Labs Lab 02/03/17 0759 02/04/17 0554 02/05/17 0526 02/09/17 1823  AST 151* 57* 59* 92*  ALT 101* 69* 64* 94*  ALKPHOS 647* 491* 561* 587*  BILITOT 13.5* 9.4* 7.8* 7.9*  PROT 5.5* 4.6* 4.7* 5.2*  ALBUMIN 2.8* 2.3* 2.2* 2.4*   No results for input(s): LIPASE, AMYLASE in the last 168 hours.  Recent Labs Lab 02/03/17 0759  AMMONIA 83*   Coagulation Profile:  Recent Labs Lab 02/03/17 0759 02/04/17 0554 02/09/17 2031  INR 1.71 1.72 1.54     Recent Results (from the  past 240 hour(s))  Surgical PCR screen     Status: None   Collection Time: 01/31/17  2:08 AM  Result Value Ref Range Status   MRSA, PCR NEGATIVE NEGATIVE Final   Staphylococcus aureus NEGATIVE NEGATIVE Final    Comment:        The Xpert SA Assay (FDA approved for NASAL specimens in patients over 32 years of age), is one component of a comprehensive surveillance program.  Test performance has been validated by [facility] for patients greater than or equal to 3 year old. It is not intended to diagnose infection nor to guide or monitor treatment.   Culture, body fluid-bottle     Status: None   Collection Time: 01/31/17  2:18 PM  Result Value Ref Range Status   Specimen Description FLUID ASCITIC  Final   Special Requests BOTTLES DRAWN AEROBIC AND ANAEROBIC  Final   Culture   Final    NO GROWTH 4 DAYS TEST WILL BE CREDITED ORDERED UNDER WRONG ACCOUNT. SEE [place] ADMISSION.   Report Status 02/04/2017 FINAL  Final  Gram stain     Status: None   Collection Time: 01/31/17  2:18 PM  Result Value Ref Range Status   Specimen Description FLUID ASCITIC  Final   Special Requests NONE  Final   Gram Stain   Final    WBC PRESENT, PREDOMINANTLY PMN NO ORGANISMS SEEN CYTOSPIN SMEAR TEST WILL BE CREDITED ORDERED UNDER WRONG ACCOUNT. SEE [place] ADMISSION.    Report Status 02/01/2017 FINAL  Final  Culture, body fluid-bottle     Status: None   Collection Time: 01/31/17  2:18 PM  Result Value Ref Range Status   Specimen Description FLUID ASCITIC  Final   Special Requests NONE  Final   Culture   Final    NO GROWTH 5 DAYS Performed at [facility], [address], [place], [place] [address]    Report Status 02/05/2017 FINAL  Final  Gram stain     Status: None   Collection Time: 01/31/17  2:18 PM  Result Value Ref Range Status   Specimen Description FLUID ASCITIC  Final   Special Requests NONE  Final   Gram Stain   Final    WBC PRESENT, PREDOMINANTLY PMN NO ORGANISMS  SEEN CYTOSPIN SMEAR Performed at [facility], [place] [address] [place], [place] [address]    Report Status 02/04/2017 FINAL  Final         Radiological Exams on Admission: Personally reviewed CTA report: Ct Angio Chest Pe W And/or Wo Contrast  Result Date: 02/09/2017 CLINICAL DATA:  Right lower extremity swelling. EXAM: CT ANGIOGRAPHY CHEST WITH CONTRAST TECHNIQUE: Multidetector CT imaging of the chest was performed using the standard protocol during bolus administration of intravenous contrast. Multiplanar CT image reconstructions and MIPs were obtained to evaluate the vascular anatomy. CONTRAST:  100 cc Isovue 370 IV COMPARISON:  Chest radiograph 02/03/2017 FINDINGS: Cardiovascular: There are no filling defects within the pulmonary arteries to suggest pulmonary embolus. Thoracic aorta is normal in caliber with mild atherosclerosis. No aneurysm. The heart is normal in size. There is no pericardial effusion. Coronary artery calcifications are seen. Mediastinum/Nodes: No mediastinal, hilar, or axillary adenopathy. Questionable thickening of the distal esophagus. Visualized thyroid gland is normal. Trachea is midline and patent. Lungs/Pleura: Small right pleural effusion, likely sympathetic. Minimal adjacent right basilar atelectasis. No left pleural effusion. No pulmonary edema. No consolidation to suggest pneumonia. No pulmonary mass or dominant nodule. Upper Abdomen: Moderate volume of upper abdominal ascites. Musculoskeletal: There are no acute or suspicious osseous abnormalities. Review of the MIP images confirms the above findings. IMPRESSION: 1. No pulmonary embolus. 2. Tiny right pleural effusion, likely sympathetic. Adjacent atelectasis. 3. Mild aortic atherosclerosis and coronary artery calcifications. Aortic Atherosclerosis (ICD10-I70.0). Electronically Signed   By: Jeb Levering M.D.   On: 02/09/2017 22:20        Assessment/Plan  1. Acute DVT:  -Heparin gtt -PPI IV   2.  Abdominal pain:  Endorsed abdominal pain to EDP and in setting of leukocytosis with left shift, agree empiric antibiotics and evaluation by GI are warranted.  Will order US paracentesis, challenge may be finding fluid to apsirate.  She did not complain of pain to me, although is a very unreliable historian. -Paracentesis ordered -Cell count, culture ordered -Ceftriaxone ordered -Consult GI, appreciate cares  3. Hyponatremia:  Stable  4. Leukocytosis:  Stable  5. Macrocytic anemia:  Stable  6. Alcoholic liver failure:  A little encephalopathic on my exam.  -Continue prednisolone -Continue lactulose -Continue spironolactone and furosemide  7. Other medications:  -Continue trazodone PRN     DVT prophylaxis: N/A  Code Status: FULL  Family Communication: None present  Disposition Plan: Anticipate IV heparin until paracentesis and then transition to oral AC.  Consult to GI re: reported abdominal pain and co-management of anticoagulation in this high risk patient. Consults called: GI Admission status: OBS At the point of initial evaluation, it is my clinical opinion that admission for OBSERVATION is reasonable and necessary because the patient's presenting complaints in the context of their chronic conditions represent sufficient risk of deterioration or significant morbidity to constitute reasonable grounds for close observation in the hospital setting, but that the patient may be medically stable for discharge from the hospital within 24 to 48 hours.    Medical decision making: Patient seen at 12:40 AM on 02/10/2017.  The patient was discussed with Dr. Ellender Hose.  What exists of the patient's chart was reviewed in depth and summarized above.  Clinical condition: stable.        Edwin Dada [facility] Pager [phone]

## 2017-02-10 NOTE — Progress Notes (Signed)
ANTICOAGULATION CONSULT NOTE - Initial Consult  Pharmacy Consult for Heparin Indication: DVT  No Known Allergies  Patient Measurements: Height: 5\' 6"  (167.6 cm) Weight: 102 lb (46.3 kg) IBW/kg (Calculated) : 59.3 Heparin Dosing Weight:   Vital Signs: Temp: 98.3 F (36.8 C) (06/20 1925) Temp Source: Oral (06/20 1644) BP: 88/61 (06/20 2142) Pulse Rate: 73 (06/20 2142)  Labs:  Recent Labs  02/09/17 1823 02/09/17 2031  HGB 10.0*  --   HCT 27.9*  --   PLT 181  --   APTT  --  34  LABPROT  --  18.6*  INR  --  1.54  CREATININE 0.78  --     Estimated Creatinine Clearance: 58.8 mL/min (by C-G formula based on SCr of 0.78 mg/dL).   Medical History: Past Medical History:  Diagnosis Date  . 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  paracentesis 2.6 liters 5/4: no SBP.    Marland Kitchen Alcoholism ([place]) 12/2016  . Dysphagia 2011  . Macrocytic anemia 12/2016  . Malnutrition ([place]) 12/2016  . Panic type anxiety neurosis 2011    Medications:  Infusions:  . albumin human    . cefTRIAXone (ROCEPHIN)  IV    . heparin      Assessment: Patient with new DVT noted.  MD notes patient has hx of GIB and elevated baseline coags.  Goal of Therapy:  Heparin level 0.3-0.7 units/ml Monitor platelets by anticoagulation protocol: Yes   Plan:  NO Heparin bolus Heparin drip at 900 units/hr Daily CBC Next heparin level at 0900    Tyler Deis, Shea Stakes [place] 02/10/2017,12:25 AM

## 2017-02-10 NOTE — ED Notes (Signed)
Called to give report RN stated that she is not ready for patient and to call back in 15-20 minutes.

## 2017-02-10 NOTE — Progress Notes (Signed)
Patient ID: Mercedes Mitchell, female   DOB: May 19, 1961, 56 y.o.   MRN: [id number]  PROGRESS NOTE    Mercedes Mitchell  VWP:[id number] DOB: 1961-05-11 DOA: 02/09/2017 PCP: [place], Betty G, MD   Brief Narrative:  56 year female with recent diagnosis of decompensated alcoholic liver disease and severe alcoholic hepatitis with recent hospitalization and discharge from 01/30/2017 through 02/05/2017 when she was started on prednisone for severe alcohol hepatitis and had paracentesis done with removal of 5.5 L fluid which was negative for SBP and liver biopsy. She presented with worsening right lower extremity swelling and was found to have right lower extremity DVT. She was admitted on heparin drip and gastroenterology was consulted because of worsening abdominal swelling and started on empiric Rocephin.   Assessment & Plan:   Principal Problem:   Acute deep vein thrombosis (DVT) of right femoral vein (HCC) Active Problems:   Acute alcoholic hepatitis   Protein-calorie malnutrition, severe   Alcoholic cirrhosis of liver with ascites (HCC)   Macrocytic anemia   Abdominal pain   Hyponatremia   1. Acute right lower extremity DVT:  -Heparin gtt. Will switch to oral anticoagulation after GI evaluation. - Monitor for signs of bleeding   2. Abdominal pain:  -In a patient with history of recent paracentesis and workup negative for SBP. Ultrasound-guided paracentesis was ordered on admission which is on hold for now as per GI recommendations. Patient might need paracentesis prior to discharge.Continue Rocephin empirically  3. Hyponatremia:  Stable. Continue with diuretics  4. Leukocytosis:  Stable  5. Macrocytic anemia:  Stable  6. Decompensated alcoholic liver disease with recent ascites and paracentesis along with severe  alcoholic hepatitis and mild hepatoencephalopathy A little encephalopathic on my exam.  -Continue prednisolone -Continue lactulose -Continue spironolactone and  furosemide - Follow further recommendations from GI. Follow AM labs including LFTs  7. Leukocytosis: Probably reactive from steroids    DVT prophylaxis: Heparin drip Code Status:  Full Family Communication: Discussed with husband present at bedside Disposition Plan: Home in 1-3 days pending improvement  Consultants: Gastroenterology  Procedures: None  Antimicrobials: Rocephin from 02/09/2017   Subjective: Patient seen and examined at bedside. She is awake but slightly confused. She complains of abdominal pain. No fever, vomiting   Objective: Vitals:   02/10/17 0502 02/10/17 0524 02/10/17 0534 02/10/17 0805  BP: 97/66 99/67 92/62  98/64  Pulse: 80 81 78 88  Resp: 14 16 16 16   Temp: 97.8 F (36.6 C) 97.9 F (36.6 C) 98 F (36.7 C) 98 F (36.7 C)  TempSrc: Oral Oral Oral Oral  SpO2: 100% 100% 100% 100%  Weight:      Height:        Intake/Output Summary (Last 24 hours) at 02/10/17 1411 Last data filed at 02/10/17 1025  Gross per 24 hour  Intake            440.3 ml  Output              350 ml  Net             90.3 ml   Filed Weights   02/09/17 1940 02/10/17 0156  Weight: 46.3 kg (102 lb) 47 kg (103 lb 9.9 oz)    Examination:  General exam: Appears Slightly confused Respiratory system: Bilateral decreased breath sound at bases with scattered crackles Cardiovascular system: S1 & S2 heard, RRR.   Gastrointestinal system: Abdomen is distended, soft and mildly tender diffusely. Normal bowel sounds heard. No rebound tenderness Central  nervous system: Alert but slightly confused. No focal neurological deficits. Moving extremities Extremities: No cyanosis, clubbing, edema  Skin: No rashes, lesions or ulcers Psychiatry: Judgement and insight appear normal. Mood & affect appropriate.     Data Reviewed: I have personally reviewed following labs and imaging studies  CBC:  Recent Labs Lab 02/04/17 0554 02/05/17 0526 02/09/17 1823 02/10/17 1011  WBC 39.1* 40.5*  43.2* 37.4*  NEUTROABS  --   --  41.1*  --   HGB 9.4* 10.0* 10.0* 9.3*  HCT 26.4* 27.9* 27.9* 26.0*  MCV 103.1* 102.2* 102.2* 102.4*  PLT 158 165 181 449   Basic Metabolic Panel:  Recent Labs Lab 02/04/17 0554 02/05/17 0526 02/09/17 1823  NA 131* 130* 132*  K 4.0 4.6 5.0  CL 96* 95* 96*  CO2 26 25 27   GLUCOSE 112* 119* 117*  BUN 25* 25* 34*  CREATININE 0.44 0.43* 0.78  CALCIUM 8.3* 8.4* 8.3*  MG  --  1.9  --    GFR: Estimated Creatinine Clearance: 59.6 mL/min (by C-G formula based on SCr of 0.78 mg/dL). Liver Function Tests:  Recent Labs Lab 02/04/17 0554 02/05/17 0526 02/09/17 1823 02/10/17 1011  AST 57* 59* 92*  --   ALT 69* 64* 94*  --   ALKPHOS 491* 561* 587*  --   BILITOT 9.4* 7.8* 7.9*  --   PROT 4.6* 4.7* 5.2* 4.9*  ALBUMIN 2.3* 2.2* 2.4*  --    No results for input(s): LIPASE, AMYLASE in the last 168 hours. No results for input(s): AMMONIA in the last 168 hours. Coagulation Profile:  Recent Labs Lab 02/04/17 0554 02/09/17 2031  INR 1.72 1.54   Cardiac Enzymes: No results for input(s): CKTOTAL, CKMB, CKMBINDEX, TROPONINI in the last 168 hours. BNP (last 3 results) No results for input(s): PROBNP in the last 8760 hours. HbA1C: No results for input(s): HGBA1C in the last 72 hours. CBG: No results for input(s): GLUCAP in the last 168 hours. Lipid Profile: No results for input(s): CHOL, HDL, LDLCALC, TRIG, CHOLHDL, LDLDIRECT in the last 72 hours. Thyroid Function Tests: No results for input(s): TSH, T4TOTAL, FREET4, T3FREE, THYROIDAB in the last 72 hours. Anemia Panel: No results for input(s): VITAMINB12, FOLATE, FERRITIN, TIBC, IRON, RETICCTPCT in the last 72 hours. Sepsis Labs: No results for input(s): PROCALCITON, LATICACIDVEN in the last 168 hours.  Recent Results (from the past 240 hour(s))  Culture, body fluid-bottle     Status: None   Collection Time: 01/31/17  2:18 PM  Result Value Ref Range Status   Specimen Description FLUID ASCITIC   Final   Special Requests BOTTLES DRAWN AEROBIC AND ANAEROBIC  Final   Culture   Final    NO GROWTH 4 DAYS TEST WILL BE CREDITED ORDERED UNDER WRONG ACCOUNT. SEE [place] ADMISSION.   Report Status 02/04/2017 FINAL  Final  Gram stain     Status: None   Collection Time: 01/31/17  2:18 PM  Result Value Ref Range Status   Specimen Description FLUID ASCITIC  Final   Special Requests NONE  Final   Gram Stain   Final    WBC PRESENT, PREDOMINANTLY PMN NO ORGANISMS SEEN CYTOSPIN SMEAR TEST WILL BE CREDITED ORDERED UNDER WRONG ACCOUNT. SEE [place] ADMISSION.    Report Status 02/01/2017 FINAL  Final  Culture, body fluid-bottle     Status: None   Collection Time: 01/31/17  2:18 PM  Result Value Ref Range Status   Specimen Description FLUID ASCITIC  Final   Special  Requests NONE  Final   Culture   Final    NO GROWTH 5 DAYS Performed at [facility], [place] [address], [place], [place] [address]    Report Status 02/05/2017 FINAL  Final  Gram stain     Status: None   Collection Time: 01/31/17  2:18 PM  Result Value Ref Range Status   Specimen Description FLUID ASCITIC  Final   Special Requests NONE  Final   Gram Stain   Final    WBC PRESENT, PREDOMINANTLY PMN NO ORGANISMS SEEN CYTOSPIN SMEAR Performed at [facility], [place] [address] [place], [place] [address]    Report Status 02/04/2017 FINAL  Final         Radiology Studies: Ct Angio Chest Pe W And/or Wo Contrast  Result Date: 02/09/2017 CLINICAL DATA:  Right lower extremity swelling. EXAM: CT ANGIOGRAPHY CHEST WITH CONTRAST TECHNIQUE: Multidetector CT imaging of the chest was performed using the standard protocol during bolus administration of intravenous contrast. Multiplanar CT image reconstructions and MIPs were obtained to evaluate the vascular anatomy. CONTRAST:  100 cc Isovue 370 IV COMPARISON:  Chest radiograph 02/03/2017 FINDINGS: Cardiovascular: There are no filling defects within the pulmonary arteries  to suggest pulmonary embolus. Thoracic aorta is normal in caliber with mild atherosclerosis. No aneurysm. The heart is normal in size. There is no pericardial effusion. Coronary artery calcifications are seen. Mediastinum/Nodes: No mediastinal, hilar, or axillary adenopathy. Questionable thickening of the distal esophagus. Visualized thyroid gland is normal. Trachea is midline and patent. Lungs/Pleura: Small right pleural effusion, likely sympathetic. Minimal adjacent right basilar atelectasis. No left pleural effusion. No pulmonary edema. No consolidation to suggest pneumonia. No pulmonary mass or dominant nodule. Upper Abdomen: Moderate volume of upper abdominal ascites. Musculoskeletal: There are no acute or suspicious osseous abnormalities. Review of the MIP images confirms the above findings. IMPRESSION: 1. No pulmonary embolus. 2. Tiny right pleural effusion, likely sympathetic. Adjacent atelectasis. 3. Mild aortic atherosclerosis and coronary artery calcifications. Aortic Atherosclerosis (ICD10-I70.0). Electronically Signed   By: Jeb Levering M.D.   On: 02/09/2017 22:20        Scheduled Meds: . feeding supplement (ENSURE ENLIVE)  237 mL Oral TID BM  . feeding supplement (PRO-STAT SUGAR FREE 64)  30 mL Oral BID  . folic acid  1 mg Oral Daily  . furosemide  60 mg Oral Daily  . lactulose  20 g Oral BID  . pantoprazole (PROTONIX) IV  40 mg Intravenous Q12H  . prednisoLONE  40 mg Oral QAC breakfast  . spironolactone  150 mg Oral Daily  . thiamine  100 mg Oral Daily  . traMADol  50 mg Oral Q6H   Continuous Infusions: . cefTRIAXone (ROCEPHIN)  IV Stopped (02/10/17 0300)  . heparin 1,050 Units/hr (02/10/17 1215)     LOS: 0 days        Aline August, MD [facility] Pager [phone]  If 7PM-7AM, please contact night-coverage [email or web address] Password [email or web address] 02/10/2017, 2:11 PM

## 2017-02-11 DIAGNOSIS — K7030 Alcoholic cirrhosis of liver without ascites: Secondary | ICD-10-CM

## 2017-02-11 DIAGNOSIS — K738 Other chronic hepatitis, not elsewhere classified: Secondary | ICD-10-CM

## 2017-02-11 LAB — COMPREHENSIVE METABOLIC PANEL
ALK PHOS: 446 U/L — AB (ref 38–126)
ALT: 73 U/L — AB (ref 14–54)
ANION GAP: 7 (ref 5–15)
AST: 72 U/L — ABNORMAL HIGH (ref 15–41)
Albumin: 2.3 g/dL — ABNORMAL LOW (ref 3.5–5.0)
BUN: 26 mg/dL — ABNORMAL HIGH (ref 6–20)
CALCIUM: 8.1 mg/dL — AB (ref 8.9–10.3)
CO2: 26 mmol/L (ref 22–32)
CREATININE: 0.47 mg/dL (ref 0.44–1.00)
Chloride: 104 mmol/L (ref 101–111)
GFR calc Af Amer: >60 mL/min (ref >60)
Glucose, Bld: 109 mg/dL — ABNORMAL HIGH (ref 65–99)
Potassium: 3.8 mmol/L (ref 3.5–5.1)
Sodium: 137 mmol/L (ref 135–145)
TOTAL PROTEIN: 4.5 g/dL — AB (ref 6.5–8.1)
Total Bilirubin: 6.3 mg/dL — ABNORMAL HIGH (ref 0.3–1.2)

## 2017-02-11 LAB — CBC
HCT: 23.8 % — ABNORMAL LOW (ref 36.0–46.0)
HEMOGLOBIN: 8.6 g/dL — AB (ref 12.0–15.0)
MCH: 37.1 pg — AB (ref 26.0–34.0)
MCHC: 36.1 g/dL — AB (ref 30.0–36.0)
MCV: 102.6 fL — AB (ref 78.0–100.0)
Platelets: 149 10*3/uL — ABNORMAL LOW (ref 150–400)
RBC: 2.32 MIL/uL — AB (ref 3.87–5.11)
RDW: 16.8 % — ABNORMAL HIGH (ref 11.5–15.5)
WBC: 32.9 10*3/uL — ABNORMAL HIGH (ref 4.0–10.5)

## 2017-02-11 LAB — MAGNESIUM: MAGNESIUM: 2.1 mg/dL (ref 1.7–2.4)

## 2017-02-11 LAB — HEPARIN LEVEL (UNFRACTIONATED)
HEPARIN UNFRACTIONATED: 0.46 [IU]/mL (ref 0.30–0.70)
Heparin Unfractionated: 0.26 IU/mL — ABNORMAL LOW (ref 0.30–0.70)

## 2017-02-11 MED ORDER — APIXABAN 5 MG PO TABS
5.0000 mg | ORAL_TABLET | Freq: Two times a day (BID) | ORAL | Status: DC
  Administered 2017-02-11: 5 mg via ORAL
  Filled 2017-02-11: qty 1

## 2017-02-11 MED ORDER — ENOXAPARIN SODIUM 60 MG/0.6ML ~~LOC~~ SOLN
1.0000 mg/kg | Freq: Two times a day (BID) | SUBCUTANEOUS | Status: DC
  Administered 2017-02-12 – 2017-02-13 (×3): 45 mg via SUBCUTANEOUS
  Filled 2017-02-11 (×4): qty 0.6

## 2017-02-11 NOTE — Progress Notes (Addendum)
ANTICOAGULATION CONSULT NOTE - follow-up  Pharmacy Consult for Heparin Indication: DVT  No Known Allergies  Patient Measurements: Height: 5\' 6"  (167.6 cm) Weight: 103 lb 9.9 oz (47 kg) IBW/kg (Calculated) : 59.3 Heparin Dosing Weight: 47 kg  Vital Signs: Temp: 97.7 F (36.5 C) (06/22 0625) Temp Source: Oral (06/22 0625) BP: 98/66 (06/22 0625) Pulse Rate: 97 (06/22 0625)  Labs:  Recent Labs  02/09/17 1823 02/09/17 2031  02/10/17 1011 02/10/17 1804 02/11/17 0207 02/11/17 1247  HGB 10.0*  --   --  9.3*  --  8.6*  --   HCT 27.9*  --   --  26.0*  --  23.8*  --   PLT 181  --   --  169  --  149*  --   APTT  --  34  --   --   --   --   --   LABPROT  --  18.6*  --   --   --   --   --   INR  --  1.54  --   --   --   --   --   HEPARINUNFRC  --   --   < > <0.10* 0.20* 0.26* 0.46  CREATININE 0.78  --   --   --   --  0.47  --   < > = values in this interval not displayed.  Estimated Creatinine Clearance: 59.6 mL/min (by C-G formula based on SCr of 0.47 mg/dL).   Medical History: Past Medical History:  Diagnosis Date  . 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  paracentesis 2.6 liters 5/4: no SBP.    Marland Kitchen Alcoholism ([place]) 12/2016  . Dysphagia 2011  . Macrocytic anemia 12/2016  . Malnutrition ([place]) 12/2016  . Panic type anxiety neurosis 2011    Medications:  Infusions:  . cefTRIAXone (ROCEPHIN)  IV Stopped (02/10/17 2159)  . heparin 1,250 Units/hr (02/11/17 0348)    Assessment: 38 YOF with alcoholic cirrhosis presents with RLE swelling due to acute DVT. Recent EGD revealed erosive esophagitis and mild gastropathy but no varices seen. Patient's currently on heparin for DVT.  Today,02/11/2017   Heparin level is now therapeutic at 0.46 with rate increased to 1250 units/hr this morning  NOTE: patients with cirrhosis have reduced antithrombin levels that results in decreased anti-Xa levels.  This reduction  is in proportion to severity of cirrhosis  CBC: anemia - Hgb and plts down slightly  No bleeding documented  Renal: SCr WNL  Baseline INR: 1.72 (6/15), 1.54 (6/20)  Calculate a Child-Pugh Score = 11 (= Class C)  Goal of Therapy:  Heparin level 0.3-0.7 units/ml, preferably 0.3-0.5 Monitor platelets by anticoagulation protocol: Yes   Plan:   continue heparin drip at 1250 units/hr  Will not bolus d/t bleeding risk of severe cirrhosis  Recheck heparin level in ~6h to confirm before changing to daily monitoring  Cirrhosis decreases anti-Xa levels so shoot for lower end of heparin level goal  Daily heparin level and CBC  Await plan for long-term anticoagulation, difficult situation with severe cirrhosis  DOACs have limited data in this population (mfg recommends avoiding in Child-Pugh Class B or C)  Warfarin more difficult to manage with baseline INR elevation  LMWH are generally preferred (? Insurance will cover) _________________________  [place] (2426): per Dr. Marin Olp, to start patient on eliquis.  He is aware if liver dysfunction and child pugh scores-- he recom  to start a lower dose for patient 5 mg bid.    Dia Sitter, PharmD, BCPS 02/11/2017 1:43 PM

## 2017-02-11 NOTE — Evaluation (Signed)
Physical Therapy Evaluation Patient Details Name: Mercedes Mitchell MRN: [id number] DOB: 30-Nov-1960 Today's Date: 02/11/2017   History of Present Illness  Mercedes Mitchell is a 56 y.o. 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  Clinical Impression  Pt admitted with above diagnosis. Pt currently with functional limitations due to the deficits listed below (see PT Problem List).  Pt will benefit from skilled PT to increase their independence and safety with mobility to allow discharge to the venue listed below.  Pt cooperative, unsteady with gait as with last admission; recommend HHPT     Follow Up Recommendations Home health PT;Supervision for mobility/OOB    Equipment Recommendations  None recommended by PT    Recommendations for Other Services       Precautions / Restrictions Precautions Precautions: Fall Restrictions Weight Bearing Restrictions: No      Mobility  Bed Mobility Overal bed mobility: Needs Assistance Bed Mobility: Supine to Sit;Sit to Supine     Supine to sit: Min guard Sit to supine: Min guard   General bed mobility comments: for safety  Transfers Overall transfer level: Needs assistance Equipment used: Rolling walker (2 wheeled) Transfers: Sit to/from Stand Sit to Stand: Min guard         General transfer comment: for safety. Cues for UE placement  Ambulation/Gait Ambulation/Gait assistance: Min guard Ambulation Distance (Feet): 120 Feet   Gait Pattern/deviations: Decreased stride length;Ataxic;Wide base of support;Staggering right;Staggering left     General Gait Details: Gait slightly ataxic at times especially at the beginning. Gait improved as gait progressed, but intermittently requiring assist to maneuver RW  Stairs            Wheelchair Mobility    Modified Rankin (Stroke Patients Only)       Balance Overall  balance assessment: Needs assistance;History of Falls Sitting-balance support: Feet supported Sitting balance-Leahy Scale: Good     Standing balance support: Bilateral upper extremity supported Standing balance-Leahy Scale: Poor                               Pertinent Vitals/Pain Pain Assessment: 0-10 Pain Score: 3  Pain Location: abd Pain Descriptors / Indicators: Sore Pain Intervention(s): Monitored during session    Home Living Family/patient expects to be discharged to:: Private residence Living Arrangements: Spouse/significant other Available Help at Discharge: Family;Neighbor;Available 24 hours/day Type of Home: House Home Access: Stairs to enter   Entrance Stairs-Number of Steps: 1 and 1 Home Layout: Two level;Able to live on main level with bedroom/bathroom Home Equipment: Walker - 4 wheels;Bedside commode      Prior Function           Comments: uses rollator most of the time     Hand Dominance        Extremity/Trunk Assessment   Upper Extremity Assessment Upper Extremity Assessment: Generalized weakness    Lower Extremity Assessment Lower Extremity Assessment: Generalized weakness       Communication   Communication: No difficulties  Cognition Arousal/Alertness: Awake/alert Behavior During Therapy: WFL for tasks assessed/performed Overall Cognitive Status: Within Functional Limits for tasks assessed                                        General Comments  Exercises     Assessment/Plan    PT Assessment Patient needs continued PT services  PT Problem List Decreased strength;Decreased activity tolerance;Decreased balance;Decreased mobility;Decreased coordination;Decreased knowledge of use of DME       PT Treatment Interventions DME instruction;Gait training;Stair training;Functional mobility training;Therapeutic activities;Therapeutic exercise;Balance training    PT Goals (Current goals can be found in  the Care Plan section)  Acute Rehab PT Goals Patient Stated Goal: go home PT Goal Formulation: With patient Time For Goal Achievement: 02/21/17 Potential to Achieve Goals: Good    Frequency Min 3X/week   Barriers to discharge        Co-evaluation               AM-PAC PT "6 Clicks" Daily Activity  Outcome Measure Difficulty turning over in bed (including adjusting bedclothes, sheets and blankets)?: Total Difficulty moving from lying on back to sitting on the side of the bed? : Total Difficulty sitting down on and standing up from a chair with arms (e.g., wheelchair, bedside commode, etc,.)?: Total Help needed moving to and from a bed to chair (including a wheelchair)?: A Little Help needed walking in hospital room?: A Little Help needed climbing 3-5 steps with a railing? : A Little 6 Click Score: 12    End of Session Equipment Utilized During Treatment: Gait belt Activity Tolerance: Patient tolerated treatment well Patient left: with call bell/phone within reach;in bed;with bed alarm set Nurse Communication: Mobility status PT Visit Diagnosis: Unsteadiness on feet (R26.81);History of falling (Z91.81);Difficulty in walking, not elsewhere classified (R26.2)    Time: 0272-5366 PT Time Calculation (min) (ACUTE ONLY): 14 min   Charges:   PT Evaluation $PT Eval Low Complexity: 1 Procedure     PT G CodesKenyon Ana, PT Pager: [phone] 02/11/2017   [facility] 02/11/2017, 2:42 PM

## 2017-02-11 NOTE — Consult Note (Addendum)
Referral MD  Reason for Referral: Acute thromboembolic disease of the right femoral vein; severe alcohol hepatitis with cirrhosis   Chief Complaint  Patient presents with  . Leg Swelling  : I have a blood clot in my right leg.  HPI: Mrs. Schuenemann is a very charming 56 year old white female. She has a history of heavy alcohol use. She now has stopped drinking. She now is 50 days without alcohol. She goes [employer]. She has alcohol hepatitis area and she did have a liver biopsy done. This was done a week ago. The pathology report shows cirrhosis with some hepatitis features.  She has had a paracentesis already. This was done last week. She had 2700 mL of fluid removed. The liver biopsy was done at the same time.  She was discharged last Saturday. She subsequently came back in. She has encephalopathy. I don't see an ammonia level that was done. She apparently has been getting lactulose.  She did have a upper endoscopy done. This showed some gastropathy. No varices were noted.  She noted swelling and pain in the right leg. She had a Doppler done. This showed a acute thrombus in the right femoral vein. This extended from the mid femoral vein to the saphenofemoral junction.  She was started on heparin.  She will require long-term anticoagulation. We are asked to see her to try to help recommend outpatient anticoagulation.  There is no history of thrombus in the family. She has had 2 pregnancies. She had normal deliveries. She's not had miscarriages.  She does not have her cycles a longer period. She does not smoke.  She does not have diabetes.  She has had decreased activity level because of the encephalopathy and hospitalizations.  Her right leg is feeling better.  She does have quite a few ecchymoses. She is quite thin. Looks like she is lost weight.  There is some indication that she might be gone for a liver transplant.  Her husband is with her.  Overall, I would say  that her performance status is ECOG 3.    Past Medical History:  Diagnosis Date  . 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  paracentesis 2.6 liters 5/4: no SBP.    Marland Kitchen Alcoholism ([place]) 12/2016  . Dysphagia 2011  . Macrocytic anemia 12/2016  . Malnutrition ([place]) 12/2016  . Panic type anxiety neurosis 2011  :  Past Surgical History:  Procedure Laterality Date  . ESOPHAGOGASTRODUODENOSCOPY N/A 12/25/2016   Procedure: ESOPHAGOGASTRODUODENOSCOPY (EGD);  Surgeon: Jerene Bears, MD;  Location: [facility];  Service: Endoscopy;  Laterality: N/A;  . IR PARACENTESIS  02/02/2017  . IR TRANSCATHETER BX  02/02/2017  . IR US GUIDE VASC ACCESS RIGHT  02/02/2017  . IR VENOGRAM HEPATIC W HEMODYNAMIC EVALUATION  02/02/2017  :   Current Facility-Administered Medications:  .  cefTRIAXone (ROCEPHIN) 2 g in dextrose 5 % 50 mL IVPB, 2 g, Intravenous, Q24H, Duffy Bruce, MD, Stopped at 02/10/17 2159 .  feeding supplement (ENSURE ENLIVE) (ENSURE ENLIVE) liquid 237 mL, 237 mL, Oral, TID BM, Danford, Suann Larry, MD, 237 mL at 02/11/17 1440 .  feeding supplement (PRO-STAT SUGAR FREE 64) liquid 30 mL, 30 mL, Oral, BID, Alekh, Kshitiz, MD, 30 mL at 02/11/17 0950 .  folic acid (FOLVITE) tablet 1 mg, 1 mg, Oral, Daily, Danford, Suann Larry, MD, 1 mg at 02/11/17 1206 .  furosemide (LASIX) tablet 60 mg, 60 mg, Oral, Daily, Danford, Harrell Gave  P, MD, 60 mg at 02/11/17 0949 .  lactulose (CHRONULAC) 10 GM/15ML solution 20 g, 20 g, Oral, BID, Danford, Suann Larry, MD, 20 g at 02/11/17 0949 .  pantoprazole (PROTONIX) injection 40 mg, 40 mg, Intravenous, Q12H, Danford, Suann Larry, MD, 40 mg at 02/11/17 0942 .  prednisoLONE (PRELONE) 15 MG/5ML SOLN 40 mg, 40 mg, Oral, QAC breakfast, Danford, Suann Larry, MD, 40 mg at 02/11/17 0948 .  spironolactone (ALDACTONE) tablet 150 mg, 150 mg, Oral, Daily, Danford, Suann Larry, MD, 150 mg at 02/11/17  0949 .  thiamine (VITAMIN B-1) tablet 100 mg, 100 mg, Oral, Daily, Danford, Suann Larry, MD, 100 mg at 02/11/17 0949 .  traMADol (ULTRAM) tablet 50 mg, 50 mg, Oral, Q6H PRN, Starla Link, Kshitiz, MD, 50 mg at 02/10/17 1953 .  traMADol (ULTRAM) tablet 50 mg, 50 mg, Oral, Q6H, Esterwood, Amy S, PA-C, 50 mg at 02/11/17 1736 .  traZODone (DESYREL) tablet 25 mg, 25 mg, Oral, QHS PRN, Danford, Suann Larry, MD, 25 mg at 02/11/17 0216:  . feeding supplement (ENSURE ENLIVE)  237 mL Oral TID BM  . feeding supplement (PRO-STAT SUGAR FREE 64)  30 mL Oral BID  . folic acid  1 mg Oral Daily  . furosemide  60 mg Oral Daily  . lactulose  20 g Oral BID  . pantoprazole (PROTONIX) IV  40 mg Intravenous Q12H  . prednisoLONE  40 mg Oral QAC breakfast  . spironolactone  150 mg Oral Daily  . thiamine  100 mg Oral Daily  . traMADol  50 mg Oral Q6H  :  No Known Allergies:  Family History  Problem Relation Age of Onset  . Hyperlipidemia Father   . Heart disease Father   . Alcoholism Father   . Heart attack Father   . Alcoholism Mother   . Alcoholism Sister   :  Social History   Social History  . Marital status: Married    Spouse name: N/A  . Number of children: N/A  . Years of education: N/A   Occupational History  . Unemployed    Social History Main Topics  . Smoking status: Former Smoker    Types: Cigarettes  . Smokeless tobacco: Never Used     Comment: Reports she quit "about 6 months ago"  . Alcohol use Yes     Comment: Last drink: 36 days ago   . Drug use: No     Comment: Last use Cocaine 11/17, reports last use "over a year", [facility] 2/18, reports "a year" ago  . Sexual activity: Not on file   Other Topics Concern  . Not on file   Social History Narrative   Married, children, not working  :  Pertinent items are noted in HPI.  Exam: Patient Vitals for the past 24 hrs:  BP Temp Temp src Pulse Resp SpO2  02/11/17 1439 105/77 98.3 F (36.8 C) Oral 95 18 99 %  02/11/17 0625 98/66  97.7 F (36.5 C) Oral 97 18 99 %  02/10/17 2108 102/67 98.2 F (36.8 C) Oral 89 18 99 %   As above    Recent Labs  02/10/17 1011 02/11/17 0207  WBC 37.4* 32.9*  HGB 9.3* 8.6*  HCT 26.0* 23.8*  PLT 169 149*    Recent Labs  02/09/17 1823 02/11/17 0207  NA 132* 137  K 5.0 3.8  CL 96* 104  CO2 27 26  GLUCOSE 117* 109*  BUN 34* 26*  CREATININE 0.78 0.47  CALCIUM 8.3* 8.1*  Blood smear review:  None  Pathology: [id number]    Assessment and Plan:  Ms. Hostler is a very nice 56 year old white female. She has cirrhosis. She has alcoholic hepatitis. She is jaundiced. Her bilirubin is coming down. Her bilirubin is 6.3. Her bilirubin week ago was 9.4. Her alkaline phosphatase is coming down. It is 446. Her albumin is 2.3. Her INR is 1.5.  I must say that she certainly is not in great shape. As such, she may not be a great candidate for oral anticoagulation area and I know that with some of the newer oral anticoagulants, that there is a decent safety profile in patients with hepatic dysfunction.  Ms. Kyler just does not have a lot of "reserve". She, I would think, would have a low prealbumin.  I think that using Lovenox, even though this is injectable would be the safe way to go. The pharmacist made this recommendation. After seeing the patient, I believe this is the best way to go. I think it is the safest way to try to minimize her bleeding.  It is a little surprising that she has a thrombus. I would not think that she needs a hypercoagulable profile.  Hopefully, her insurance will cover outpatient Lovenox. I would try to consider daily Lovenox injections and not twice a day injections.  I spoke to she and her husband at length. I spent about 45 minutes with them. I explained why I thought that Lovenox would be a better choice for her. Her husband definitely will be able to do the injections at home.  I would probably anticoagulate her for 3 months. I think the shortest  amount of time for anticoagulation would be reasonable.  I don't see that she needs an IVC filter and right now. There is no contraindication for her to be on Lovenox. However, if she does have bleeding because of anticoagulation and her cirrhosis, then an IVC filter will be necessary.  This is a tough situation. I might sure there is a true "right answer" as to how to anticoagulate her. Again it is unusual for somebody with hepatic dysfunction like this to have a hypercoagulable state.  As always, I very much appreciate pharmacy's input. As always, they are right on the mark with what would be appropriate for our patients.  We will follow along.  Lattie Haw, MD  Psalms 5:11

## 2017-02-11 NOTE — Progress Notes (Signed)
ANTICOAGULATION CONSULT NOTE - Follow Up Consult  Pharmacy Consult for Heparin  Indication: DVT  No Known Allergies  Patient Measurements: Height: 5\' 6"  (167.6 cm) Weight: 103 lb 9.9 oz (47 kg) IBW/kg (Calculated) : 59.3 Heparin Dosing Weight:   Vital Signs: Temp: 98.2 F (36.8 C) (06/21 2108) Temp Source: Oral (06/21 2108) BP: 102/67 (06/21 2108) Pulse Rate: 89 (06/21 2108)  Labs:  Recent Labs  02/09/17 1823 02/09/17 2031 02/10/17 1011 02/10/17 1804 02/11/17 0207  HGB 10.0*  --  9.3*  --  8.6*  HCT 27.9*  --  26.0*  --  23.8*  PLT 181  --  169  --  149*  APTT  --  34  --   --   --   LABPROT  --  18.6*  --   --   --   INR  --  1.54  --   --   --   HEPARINUNFRC  --   --  <0.10* 0.20* 0.26*  CREATININE 0.78  --   --   --  0.47    Estimated Creatinine Clearance: 59.6 mL/min (by C-G formula based on SCr of 0.47 mg/dL).   Medications:  Infusions:  . cefTRIAXone (ROCEPHIN)  IV Stopped (02/10/17 2159)  . heparin 1,200 Units/hr (02/11/17 0112)    Assessment: Patient with low heparin level.  No heparin or bleeding issues per RN.  Goal of Therapy:  Heparin level 0.3-0.7 units/ml, preferably 0.3-0.5 Monitor platelets by anticoagulation protocol: Yes   Plan:  Increase heparin to 1250 units/hr Recheck level at [address], [place] 02/11/2017,3:44 AM

## 2017-02-11 NOTE — Progress Notes (Signed)
ANTICOAGULATION CONSULT NOTE - follow-up  Pharmacy Consult for Heparin Indication: DVT  No Known Allergies  Patient Measurements: Height: 5\' 6"  (167.6 cm) Weight: 103 lb 9.9 oz (47 kg) IBW/kg (Calculated) : 59.3 Heparin Dosing Weight: 47 kg  Vital Signs: Temp: 98.3 F (36.8 C) (06/22 1439) Temp Source: Oral (06/22 1439) BP: 105/77 (06/22 1439) Pulse Rate: 95 (06/22 1439)  Labs:  Recent Labs  02/09/17 1823 02/09/17 2031  02/10/17 1011 02/10/17 1804 02/11/17 0207 02/11/17 1247  HGB 10.0*  --   --  9.3*  --  8.6*  --   HCT 27.9*  --   --  26.0*  --  23.8*  --   PLT 181  --   --  169  --  149*  --   APTT  --  34  --   --   --   --   --   LABPROT  --  18.6*  --   --   --   --   --   INR  --  1.54  --   --   --   --   --   HEPARINUNFRC  --   --   < > <0.10* 0.20* 0.26* 0.46  CREATININE 0.78  --   --   --   --  0.47  --   < > = values in this interval not displayed.  Estimated Creatinine Clearance: 59.6 mL/min (by C-G formula based on SCr of 0.47 mg/dL).   Medical History: Past Medical History:  Diagnosis Date  . 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  paracentesis 2.6 liters 5/4: no SBP.    Marland Kitchen Alcoholism ([place]) 12/2016  . Dysphagia 2011  . Macrocytic anemia 12/2016  . Malnutrition ([place]) 12/2016  . Panic type anxiety neurosis 2011    Medications:  Infusions:  . cefTRIAXone (ROCEPHIN)  IV Stopped (02/10/17 2159)    Assessment: 71 YOF with alcoholic cirrhosis presents with RLE swelling due to acute DVT. Recent EGD revealed erosive esophagitis and mild gastropathy but no varices seen. Patient's currently on heparin for DVT.  Today,02/11/2017   Heparin level is now therapeutic at 0.46 with rate increased to 1250 units/hr this morning  NOTE: patients with cirrhosis have reduced antithrombin levels that results in decreased anti-Xa levels.  This reduction is in proportion to severity of  cirrhosis  CBC: anemia - Hgb and plts down slightly  No bleeding documented  Renal: SCr WNL  Baseline INR: 1.72 (6/15), 1.54 (6/20)  Calculate a Child-Pugh Score = 11 (= Class C)  Goal of Therapy:  Heparin level 0.3-0.7 units/ml, preferably 0.3-0.5 Monitor platelets by anticoagulation protocol: Yes   Plan:   continue heparin drip at 1250 units/hr  Will not bolus d/t bleeding risk of severe cirrhosis  Recheck heparin level in ~6h to confirm before changing to daily monitoring  Cirrhosis decreases anti-Xa levels so shoot for lower end of heparin level goal  Daily heparin level and CBC  Await plan for long-term anticoagulation, difficult situation with severe cirrhosis  DOACs have limited data in this population (mfg recommends avoiding in Child-Pugh Class B or C)  Warfarin more difficult to manage with baseline INR elevation  LMWH are generally preferred (? Insurance will cover) _________________________  Adden ([phone]): per Dr. Marin Olp, to start patient on eliquis.  He is aware if liver dysfunction and child pugh scores-- he recom to start a lower dose for patient  5 mg bid.          Adden [phone]): per Dr. Marin Olp, start enoxaparin as patient is too unstable for eliquis  Goal of therapy: Anti-Xa level 0.6-1 units/ml 4 hours after LMWH dose given Monitor platelets by anticoagulation protocol  PLAN:   Pt received eliquis 5mg  po x 1 @ [phone]  Starting tomorrow enoxaparin 1mg /kg (45mg ) SQ q12h  Follow CBC q72h  follow renal function  Dolly Rias RPh 02/11/2017, 6:44 PM Pager [phone]

## 2017-02-11 NOTE — Progress Notes (Signed)
Patient ID: Mercedes Mitchell, female   DOB: Dec 05, 1960, 56 y.o.   MRN: [id number]  PROGRESS NOTE    Mercedes Mitchell  WOE:[id number] DOB: 14-Mar-1961 DOA: 02/09/2017 PCP: [place], Betty G, MD   Brief Narrative:  56 year female with recent diagnosis of decompensated alcoholic liver disease and severe alcoholic hepatitis with recent hospitalization and discharge from 01/30/2017 through 02/05/2017 when she was started on prednisone for severe alcohol hepatitis and had paracentesis done with removal of 5.5 L fluid which was negative for SBP and liver biopsy. She presented with worsening right lower extremity swelling and was found to have right lower extremity DVT. She was admitted on heparin drip and gastroenterology was consulted because of worsening abdominal swelling and started on empiric Rocephin.  Assessment & Plan:   Principal Problem:   Acute deep vein thrombosis (DVT) of right femoral vein (HCC) Active Problems:   Acute alcoholic hepatitis   Protein-calorie malnutrition, severe   Alcoholic cirrhosis of liver with ascites (HCC)   Macrocytic anemia   Abdominal pain   Hyponatremia   DVT (deep venous thrombosis) ([place])     1. Acute right lower extremity DVT: - Continue Heparin drip. Spoke to Amy from GI who discussed the plan with Dr. Fuller Plan from GI and as per their recommendations, newer oral anticoagulation like Xarelto might be better as patient might need recurrent paracentesis. We'll start Xarelto - Monitor for signs of bleeding   2. Abdominal pain: -In a patient with history of recent paracentesis and workup negative for SBP. Ultrasound-guided paracentesis was ordered on admission which is on hold for now as per GI recommendations.  - Continue empiric Rocephin for now. We will hold off on paracentesis for now  3. Hyponatremia: Resolved. Continue with diuretics  4. Leukocytosis: Improving. Probably reactive  5. Macrocytic anemia: Stable  6. Decompensated  alcoholic liver disease with recent ascites and paracentesis along with severe  alcoholic hepatitis and mild hepatoencephalopathy A little encephalopathic on my exam.  -Continue prednisolone -Continue lactulose -Continue spironolactone and furosemide - Follow further recommendations from GI. Follow AM labs including LFTs. Outpatient follow-up with GI     DVT prophylaxis: Heparin drip Code Status:  Full Family Communication:  none at bedside Disposition Plan: Home in 1-3 days pending improvement  Consultants: Gastroenterology  Procedures: None  Antimicrobials: Rocephin from 02/09/2017 Subjective: Patient seen and examined at bedside. She is awake and answers some questions. She feels slightly better. She is very weak and tired. No overnight fever or vomiting  Objective: Vitals:   02/10/17 0805 02/10/17 1440 02/10/17 2108 02/11/17 0625  BP: 98/64 107/69 102/67 98/66  Pulse: 88 84 89 97  Resp: 16 16 18 18   Temp: 98 F (36.7 C) 98 F (36.7 C) 98.2 F (36.8 C) 97.7 F (36.5 C)  TempSrc: Oral Oral Oral Oral  SpO2: 100% 99% 99% 99%  Weight:      Height:        Intake/Output Summary (Last 24 hours) at 02/11/17 1419 Last data filed at 02/11/17 0950  Gross per 24 hour  Intake          1732.68 ml  Output              250 ml  Net          1482.68 ml   Filed Weights   02/09/17 1940 02/10/17 0156  Weight: 46.3 kg (102 lb) 47 kg (103 lb 9.9 oz)    Examination:  General exam: Appears calm and comfortable  Respiratory system: Bilateral decreased breath sound at bases With scattered crackles Cardiovascular system: S1 & S2 heard, RRR. Gastrointestinal system: Abdomen is distended, soft and nontender. Normal bowel sounds heard. Central nervous system: Alert and oriented. No focal neurological deficits. Moving extremities Extremities: No cyanosis, clubbing; right lower extremity 2+ pitting edema present    Data Reviewed: I have personally reviewed following labs and  imaging studies  CBC:  Recent Labs Lab 02/05/17 0526 02/09/17 1823 02/10/17 1011 02/11/17 0207  WBC 40.5* 43.2* 37.4* 32.9*  NEUTROABS  --  41.1*  --   --   HGB 10.0* 10.0* 9.3* 8.6*  HCT 27.9* 27.9* 26.0* 23.8*  MCV 102.2* 102.2* 102.4* 102.6*  PLT 165 181 169 834*   Basic Metabolic Panel:  Recent Labs Lab 02/05/17 0526 02/09/17 1823 02/11/17 0207  NA 130* 132* 137  K 4.6 5.0 3.8  CL 95* 96* 104  CO2 25 27 26   GLUCOSE 119* 117* 109*  BUN 25* 34* 26*  CREATININE 0.43* 0.78 0.47  CALCIUM 8.4* 8.3* 8.1*  MG 1.9  --  2.1   GFR: Estimated Creatinine Clearance: 59.6 mL/min (by C-G formula based on SCr of 0.47 mg/dL). Liver Function Tests:  Recent Labs Lab 02/05/17 0526 02/09/17 1823 02/10/17 1011 02/11/17 0207  AST 59* 92*  --  72*  ALT 64* 94*  --  73*  ALKPHOS 561* 587*  --  446*  BILITOT 7.8* 7.9*  --  6.3*  PROT 4.7* 5.2* 4.9* 4.5*  ALBUMIN 2.2* 2.4*  --  2.3*   No results for input(s): LIPASE, AMYLASE in the last 168 hours. No results for input(s): AMMONIA in the last 168 hours. Coagulation Profile:  Recent Labs Lab 02/09/17 2031  INR 1.54   Cardiac Enzymes: No results for input(s): CKTOTAL, CKMB, CKMBINDEX, TROPONINI in the last 168 hours. BNP (last 3 results) No results for input(s): PROBNP in the last 8760 hours. HbA1C: No results for input(s): HGBA1C in the last 72 hours. CBG: No results for input(s): GLUCAP in the last 168 hours. Lipid Profile: No results for input(s): CHOL, HDL, LDLCALC, TRIG, CHOLHDL, LDLDIRECT in the last 72 hours. Thyroid Function Tests: No results for input(s): TSH, T4TOTAL, FREET4, T3FREE, THYROIDAB in the last 72 hours. Anemia Panel: No results for input(s): VITAMINB12, FOLATE, FERRITIN, TIBC, IRON, RETICCTPCT in the last 72 hours. Sepsis Labs: No results for input(s): PROCALCITON, LATICACIDVEN in the last 168 hours.  Recent Results (from the past 240 hour(s))  Blood culture (routine x 2)     Status: None  (Preliminary result)   Collection Time: 02/10/17 12:02 AM  Result Value Ref Range Status   Specimen Description BLOOD LEFT FOREARM  Final   Special Requests   Final    BOTTLES DRAWN AEROBIC AND ANAEROBIC Blood Culture adequate volume   Culture   Final    NO GROWTH 1 DAY Performed at [facility], [address] [place], [place] [address]    Report Status PENDING  Incomplete  Blood culture (routine x 2)     Status: None (Preliminary result)   Collection Time: 02/10/17 12:07 AM  Result Value Ref Range Status   Specimen Description BLOOD LEFT WRIST  Final   Special Requests IN PEDIATRIC BOTTLE Blood Culture adequate volume  Final   Culture   Final    NO GROWTH 1 DAY Performed at [facility], [place] [address] [place], [place] [address]    Report Status PENDING  Incomplete  Radiology Studies: Ct Angio Chest Pe W And/or Wo Contrast  Result Date: 02/09/2017 CLINICAL DATA:  Right lower extremity swelling. EXAM: CT ANGIOGRAPHY CHEST WITH CONTRAST TECHNIQUE: Multidetector CT imaging of the chest was performed using the standard protocol during bolus administration of intravenous contrast. Multiplanar CT image reconstructions and MIPs were obtained to evaluate the vascular anatomy. CONTRAST:  100 cc Isovue 370 IV COMPARISON:  Chest radiograph 02/03/2017 FINDINGS: Cardiovascular: There are no filling defects within the pulmonary arteries to suggest pulmonary embolus. Thoracic aorta is normal in caliber with mild atherosclerosis. No aneurysm. The heart is normal in size. There is no pericardial effusion. Coronary artery calcifications are seen. Mediastinum/Nodes: No mediastinal, hilar, or axillary adenopathy. Questionable thickening of the distal esophagus. Visualized thyroid gland is normal. Trachea is midline and patent. Lungs/Pleura: Small right pleural effusion, likely sympathetic. Minimal adjacent right basilar atelectasis. No left pleural effusion. No pulmonary edema. No  consolidation to suggest pneumonia. No pulmonary mass or dominant nodule. Upper Abdomen: Moderate volume of upper abdominal ascites. Musculoskeletal: There are no acute or suspicious osseous abnormalities. Review of the MIP images confirms the above findings. IMPRESSION: 1. No pulmonary embolus. 2. Tiny right pleural effusion, likely sympathetic. Adjacent atelectasis. 3. Mild aortic atherosclerosis and coronary artery calcifications. Aortic Atherosclerosis (ICD10-I70.0). Electronically Signed   By: Jeb Levering M.D.   On: 02/09/2017 22:20        Scheduled Meds: . feeding supplement (ENSURE ENLIVE)  237 mL Oral TID BM  . feeding supplement (PRO-STAT SUGAR FREE 64)  30 mL Oral BID  . folic acid  1 mg Oral Daily  . furosemide  60 mg Oral Daily  . lactulose  20 g Oral BID  . pantoprazole (PROTONIX) IV  40 mg Intravenous Q12H  . prednisoLONE  40 mg Oral QAC breakfast  . spironolactone  150 mg Oral Daily  . thiamine  100 mg Oral Daily  . traMADol  50 mg Oral Q6H   Continuous Infusions: . cefTRIAXone (ROCEPHIN)  IV Stopped (02/10/17 2159)  . heparin 1,250 Units/hr (02/11/17 0348)     LOS: 1 day        Aline August, MD [facility] Pager [phone]  If 7PM-7AM, please contact night-coverage [email or web address] Password [facility] 02/11/2017, 2:19 PM

## 2017-02-12 LAB — COMPREHENSIVE METABOLIC PANEL
ALBUMIN: 2.3 g/dL — AB (ref 3.5–5.0)
ALK PHOS: 497 U/L — AB (ref 38–126)
ALT: 74 U/L — AB (ref 14–54)
AST: 67 U/L — ABNORMAL HIGH (ref 15–41)
Anion gap: 7 (ref 5–15)
BUN: 18 mg/dL (ref 6–20)
CALCIUM: 8.5 mg/dL — AB (ref 8.9–10.3)
CO2: 29 mmol/L (ref 22–32)
Chloride: 100 mmol/L — ABNORMAL LOW (ref 101–111)
Creatinine, Ser: 0.4 mg/dL — ABNORMAL LOW (ref 0.44–1.00)
GFR calc Af Amer: >60 mL/min (ref >60)
GFR calc non Af Amer: >60 mL/min (ref >60)
GLUCOSE: 84 mg/dL (ref 65–99)
Potassium: 4.3 mmol/L (ref 3.5–5.1)
SODIUM: 136 mmol/L (ref 135–145)
Total Bilirubin: 5.3 mg/dL — ABNORMAL HIGH (ref 0.3–1.2)
Total Protein: 5 g/dL — ABNORMAL LOW (ref 6.5–8.1)

## 2017-02-12 LAB — CBC
HCT: 27 % — ABNORMAL LOW (ref 36.0–46.0)
HEMOGLOBIN: 9.4 g/dL — AB (ref 12.0–15.0)
MCH: 36.7 pg — ABNORMAL HIGH (ref 26.0–34.0)
MCHC: 34.8 g/dL (ref 30.0–36.0)
MCV: 105.5 fL — ABNORMAL HIGH (ref 78.0–100.0)
Platelets: 146 10*3/uL — ABNORMAL LOW (ref 150–400)
RBC: 2.56 MIL/uL — AB (ref 3.87–5.11)
RDW: 16.9 % — ABNORMAL HIGH (ref 11.5–15.5)
WBC: 30.3 10*3/uL — ABNORMAL HIGH (ref 4.0–10.5)

## 2017-02-12 LAB — MAGNESIUM: Magnesium: 1.9 mg/dL (ref 1.7–2.4)

## 2017-02-12 MED ORDER — PANTOPRAZOLE SODIUM 40 MG PO TBEC
40.0000 mg | DELAYED_RELEASE_TABLET | Freq: Two times a day (BID) | ORAL | Status: DC
  Administered 2017-02-12 – 2017-02-13 (×3): 40 mg via ORAL
  Filled 2017-02-12 (×3): qty 1

## 2017-02-12 MED ORDER — ONDANSETRON HCL 4 MG/2ML IJ SOLN
4.0000 mg | Freq: Four times a day (QID) | INTRAMUSCULAR | Status: DC | PRN
  Administered 2017-02-12 – 2017-02-13 (×3): 4 mg via INTRAVENOUS
  Filled 2017-02-12 (×3): qty 2

## 2017-02-12 NOTE — Progress Notes (Signed)
The patient is receiving Protonix by the intravenous route.  Based on criteria approved by the Pharmacy and [place], the medication is being converted to the equivalent oral dose form.  These criteria include: -No active GI bleeding -Able to tolerate diet of full liquids (or better) or tube feeding -Able to tolerate other medications by the oral or enteral route  If you have any questions about this conversion, please contact the Pharmacy Department (phone [phone]).  Thank you. Eudelia Bunch, Pharm.D. [phone] 02/12/2017 7:46 AM

## 2017-02-12 NOTE — Progress Notes (Signed)
Patient ID: Mercedes Mitchell, female   DOB: 1961/08/08, 56 y.o.   MRN: [id number]  PROGRESS NOTE    Mercedes Mitchell  BCW:[id number] DOB: [birth year] DOA: 02/09/2017 PCP: [place], Betty G, MD   Brief Narrative:  56 year female with recent diagnosis of decompensated alcoholic liver disease and severe alcoholic hepatitis with recent hospitalization and discharge from 01/30/2017 through 02/05/2017 when she was started on prednisone for severe alcohol hepatitis and had paracentesis done with removal of 5.5 L fluid which was negative for SBP and liver biopsy. She presented with worsening right lower extremity swelling and was found to have right lower extremity DVT. She was admitted on heparin drip and gastroenterology was consulted because of worsening abdominal swelling and started on empiric Rocephin. She was evaluated by oncology who recommended treatment with Lovenox.  Assessment & Plan:   Principal Problem:   Acute deep vein thrombosis (DVT) of right femoral vein (HCC) Active Problems:   Acute alcoholic hepatitis   Protein-calorie malnutrition, severe   Alcoholic cirrhosis of liver with ascites (HCC)   Macrocytic anemia   Abdominal pain   Hyponatremia   DVT (deep venous thrombosis) ([place])     1. Acute right lower extremityDVT: - Consultation from oncology has been appreciated. Patient has been switched to Lovenox as per their recommendations which will be continued at the time of discharge - Monitor for signs of bleeding  2. Abdominal pain: -In a patient with history of recent paracentesis and workup negative for SBP. Ultrasound-guided paracentesis was ordered on admission which is on hold for now as per GI recommendations.  - Continue empiric Rocephin for now. We will hold off on paracentesis for now  3. Hyponatremia: Resolved. Continue with diuretics  4. Leukocytosis: Improving. Probably reactive  5. Macrocytic anemia: Stable  6. Decompensated alcoholic liver  disease with recent ascites and paracentesis along with severe alcoholic hepatitis and mild hepatoencephalopathy -Continue prednisolone -Continue lactulose -Continue spironolactone and furosemide - Follow further recommendations from GI. Follow AM labs including LFTs. Outpatient follow-up with GI  7. Thrombocytopenia: Monitor platelets specially now that the patient is on anticoagulation.   DVT prophylaxis:Lovenox  Code Status:Full Family Communication: none at bedside Disposition Plan:Home in 1-2 days Consultants:Gastroenterology; oncology  Procedures:None  Antimicrobials: Rocephin from 02/09/2017  Subjective: Patient seen and examined at bedside. She feels slightly better. She still is very weak but denies any overnight fever, nausea, vomiting.  Objective: Vitals:   02/11/17 0625 02/11/17 1439 02/11/17 2122 02/12/17 0537  BP: 98/66 105/77 126/84 98/69  Pulse: 97 95 84 70  Resp: 18 18 18 18   Temp: 97.7 F (36.5 C) 98.3 F (36.8 C) 98 F (36.7 C) 97.9 F (36.6 C)  TempSrc: Oral Oral Oral Oral  SpO2: 99% 99% 98% 99%  Weight:      Height:        Intake/Output Summary (Last 24 hours) at 02/12/17 1344 Last data filed at 02/12/17 0900  Gross per 24 hour  Intake             1300 ml  Output             1100 ml  Net              200 ml   Filed Weights   02/09/17 1940 02/10/17 0156  Weight: 46.3 kg (102 lb) 47 kg (103 lb 9.9 oz)    Examination:  General exam: Appears calm and comfortable  Respiratory system: Bilateral decreased breath sound at bases with scattered  crackles Cardiovascular system: S1 & S2 heard, rate controlled  Gastrointestinal system: Abdomen is distended almost the same as yesterday, soft and nontender. Normal bowel sounds heard. Extremities: No cyanosis, clubbing, right lower extremity 1-2+ pitting edema    Data Reviewed: I have personally reviewed following labs and imaging studies  CBC:  Recent Labs Lab 02/09/17 1823  02/10/17 1011 02/11/17 0207 02/12/17 0501  WBC 43.2* 37.4* 32.9* 30.3*  NEUTROABS 41.1*  --   --   --   HGB 10.0* 9.3* 8.6* 9.4*  HCT 27.9* 26.0* 23.8* 27.0*  MCV 102.2* 102.4* 102.6* 105.5*  PLT 181 169 149* 119*   Basic Metabolic Panel:  Recent Labs Lab 02/09/17 1823 02/11/17 0207 02/12/17 0501  NA 132* 137 136  K 5.0 3.8 4.3  CL 96* 104 100*  CO2 27 26 29   GLUCOSE 117* 109* 84  BUN 34* 26* 18  CREATININE 0.78 0.47 0.40*  CALCIUM 8.3* 8.1* 8.5*  MG  --  2.1 1.9   GFR: Estimated Creatinine Clearance: 59.6 mL/min (A) (by C-G formula based on SCr of 0.4 mg/dL (L)). Liver Function Tests:  Recent Labs Lab 02/09/17 1823 02/10/17 1011 02/11/17 0207 02/12/17 0501  AST 92*  --  72* 67*  ALT 94*  --  73* 74*  ALKPHOS 587*  --  446* 497*  BILITOT 7.9*  --  6.3* 5.3*  PROT 5.2* 4.9* 4.5* 5.0*  ALBUMIN 2.4*  --  2.3* 2.3*   No results for input(s): LIPASE, AMYLASE in the last 168 hours. No results for input(s): AMMONIA in the last 168 hours. Coagulation Profile:  Recent Labs Lab 02/09/17 2031  INR 1.54   Cardiac Enzymes: No results for input(s): CKTOTAL, CKMB, CKMBINDEX, TROPONINI in the last 168 hours. BNP (last 3 results) No results for input(s): PROBNP in the last 8760 hours. HbA1C: No results for input(s): HGBA1C in the last 72 hours. CBG: No results for input(s): GLUCAP in the last 168 hours. Lipid Profile: No results for input(s): CHOL, HDL, LDLCALC, TRIG, CHOLHDL, LDLDIRECT in the last 72 hours. Thyroid Function Tests: No results for input(s): TSH, T4TOTAL, FREET4, T3FREE, THYROIDAB in the last 72 hours. Anemia Panel: No results for input(s): VITAMINB12, FOLATE, FERRITIN, TIBC, IRON, RETICCTPCT in the last 72 hours. Sepsis Labs: No results for input(s): PROCALCITON, LATICACIDVEN in the last 168 hours.  Recent Results (from the past 240 hour(s))  Blood culture (routine x 2)     Status: None (Preliminary result)   Collection Time: 02/10/17 12:02 AM   Result Value Ref Range Status   Specimen Description BLOOD LEFT FOREARM  Final   Special Requests   Final    BOTTLES DRAWN AEROBIC AND ANAEROBIC Blood Culture adequate volume   Culture   Final    NO GROWTH 2 DAYS Performed at [facility], [address] [place], [place] [address]    Report Status PENDING  Incomplete  Blood culture (routine x 2)     Status: None (Preliminary result)   Collection Time: 02/10/17 12:07 AM  Result Value Ref Range Status   Specimen Description BLOOD LEFT WRIST  Final   Special Requests IN PEDIATRIC BOTTLE Blood Culture adequate volume  Final   Culture   Final    NO GROWTH 2 DAYS Performed at [facility], [place] [address], [place], [place] [address]    Report Status PENDING  Incomplete         Radiology Studies: No results found.  Scheduled Meds: . enoxaparin (LOVENOX) injection  1 mg/kg Subcutaneous Q12H  . feeding supplement (ENSURE ENLIVE)  237 mL Oral TID BM  . feeding supplement (PRO-STAT SUGAR FREE 64)  30 mL Oral BID  . folic acid  1 mg Oral Daily  . furosemide  60 mg Oral Daily  . lactulose  20 g Oral BID  . pantoprazole  40 mg Oral BID  . prednisoLONE  40 mg Oral QAC breakfast  . spironolactone  150 mg Oral Daily  . thiamine  100 mg Oral Daily  . traMADol  50 mg Oral Q6H   Continuous Infusions: . cefTRIAXone (ROCEPHIN)  IV Stopped (02/11/17 2349)     LOS: 2 days        Aline August, MD [facility] Pager [phone]  If 7PM-7AM, please contact night-coverage [email or web address] Password [email or web address] 02/12/2017, 1:44 PM

## 2017-02-13 LAB — CBC WITH DIFFERENTIAL/PLATELET
BASOS PCT: 0 %
Basophils Absolute: 0 10*3/uL (ref 0.0–0.1)
EOS PCT: 0 %
Eosinophils Absolute: 0 10*3/uL (ref 0.0–0.7)
HEMATOCRIT: 25.3 % — AB (ref 36.0–46.0)
HEMOGLOBIN: 8.8 g/dL — AB (ref 12.0–15.0)
Lymphocytes Relative: 4 %
Lymphs Abs: 1.3 10*3/uL (ref 0.7–4.0)
MCH: 36.1 pg — ABNORMAL HIGH (ref 26.0–34.0)
MCHC: 34.8 g/dL (ref 30.0–36.0)
MCV: 103.7 fL — ABNORMAL HIGH (ref 78.0–100.0)
MONOS PCT: 7 %
Monocytes Absolute: 2.3 10*3/uL — ABNORMAL HIGH (ref 0.1–1.0)
NEUTROS PCT: 89 %
Neutro Abs: 28.9 10*3/uL — ABNORMAL HIGH (ref 1.7–7.7)
Platelets: 168 10*3/uL (ref 150–400)
RBC: 2.44 MIL/uL — AB (ref 3.87–5.11)
RDW: 16.4 % — ABNORMAL HIGH (ref 11.5–15.5)
WBC: 32.5 10*3/uL — AB (ref 4.0–10.5)

## 2017-02-13 LAB — COMPREHENSIVE METABOLIC PANEL
ALBUMIN: 2.3 g/dL — AB (ref 3.5–5.0)
ALK PHOS: 462 U/L — AB (ref 38–126)
ALT: 67 U/L — AB (ref 14–54)
AST: 62 U/L — AB (ref 15–41)
Anion gap: 7 (ref 5–15)
BUN: 25 mg/dL — ABNORMAL HIGH (ref 6–20)
CALCIUM: 8.3 mg/dL — AB (ref 8.9–10.3)
CHLORIDE: 99 mmol/L — AB (ref 101–111)
CO2: 27 mmol/L (ref 22–32)
Creatinine, Ser: 0.4 mg/dL — ABNORMAL LOW (ref 0.44–1.00)
GFR calc Af Amer: >60 mL/min (ref >60)
GFR calc non Af Amer: >60 mL/min (ref >60)
GLUCOSE: 88 mg/dL (ref 65–99)
Potassium: 3.9 mmol/L (ref 3.5–5.1)
SODIUM: 133 mmol/L — AB (ref 135–145)
Total Bilirubin: 4.9 mg/dL — ABNORMAL HIGH (ref 0.3–1.2)
Total Protein: 4.7 g/dL — ABNORMAL LOW (ref 6.5–8.1)

## 2017-02-13 LAB — MAGNESIUM: Magnesium: 1.8 mg/dL (ref 1.7–2.4)

## 2017-02-13 MED ORDER — ENOXAPARIN (LOVENOX) PATIENT EDUCATION KIT
PACK | Freq: Once | Status: AC
  Administered 2017-02-13: 13:00:00
  Filled 2017-02-13: qty 1

## 2017-02-13 MED ORDER — ENOXAPARIN SODIUM 60 MG/0.6ML ~~LOC~~ SOLN: 1.0000 mg/kg | SUBCUTANEOUS | 0 refills | Status: DC

## 2017-02-13 MED ORDER — ONDANSETRON HCL 4 MG PO TABS: 4.0000 mg | ORAL_TABLET | Freq: Three times a day (TID) | ORAL | 0 refills | Status: DC | PRN

## 2017-02-13 MED ORDER — ENOXAPARIN (LOVENOX) PATIENT EDUCATION KIT: 1.0000 | PACK | Freq: Once | 0 refills | Status: AC

## 2017-02-13 NOTE — Discharge Summary (Signed)
Physician Discharge Summary  Ricquel Foulk ESP:[id number] DOB: [birth year] DOA: 02/09/2017  PCP: Martinique, Betty G, MD  Admit date: 02/09/2017 Discharge date: 02/13/2017  Admitted From: Home Disposition:  Home  Recommendations for Outpatient Follow-up:  1. Follow up with PCP in 1week 2. Please follow up with gastroenterology in 1 week time or as scheduled with repeat CBC and CMP  Home Health: Yes (PT/OT/nursing) Equipment/Devices: None  Discharge Condition: Guarded  CODE STATUS: Full  Diet recommendation: Heart Healthy    Brief/Interim Summary: 56 year female with recent diagnosis of decompensated alcoholic liver disease and severe alcoholic hepatitis with recent hospitalization and discharge from 01/30/2017 through 02/05/2017 when she was started on prednisone for severe alcohol hepatitis and had paracentesis done with removal of 5.5 L fluid which was negative for SBP and liver biopsy. She presented with worsening right lower extremity swelling and was found to have right lower extremity DVT. She was admitted on heparin drip and gastroenterology was consulted because of worsening abdominal swelling and started on empiric Rocephin. She was evaluated by oncology who recommended treatment with Lovenox once daily as an outpatient at least for 3 months.  Discharge Diagnoses:  Principal Problem:   Acute deep vein thrombosis (DVT) of right femoral vein (HCC) Active Problems:   Acute alcoholic hepatitis   Protein-calorie malnutrition, severe   Alcoholic cirrhosis of liver with ascites (HCC)   Macrocytic anemia   Abdominal pain   Hyponatremia   DVT (deep venous thrombosis) ([place])   1. Acute right lower extremityDVT: - Consultation from oncology has been appreciated. Patient has been switched to Lovenox as per their recommendations. - Monitor for signs of bleeding - Lovenox 1 mg/kg subcutaneous daily for at least 3 months as per Dr.Ennever's recommendations  2. Abdominal pain: -In  a patient with history of recent paracentesis and workup negative for SBP. Ultrasound-guided paracentesis was ordered on admission which is on hold for now as per GI recommendations.  - Currently on empiric Rocephin. We will hold off on paracentesis for now - Abdominal pain is improving - Outpatient follow-up with GI  3. Hyponatremia: - Stable. Continue with diuretics  4. Leukocytosis: Improving. Probably reactive  5. Macrocytic anemia: Stable  6. Decompensated alcoholic liver disease with recent ascites and paracentesis along with severe alcoholic hepatitis and mild hepatoencephalopathy -Continue prednisolone -Continue lactulose -Continue spironolactone and furosemide; doses adjustment will be done by GI as an outpatient - Outpatient follow-up with GI. Patient probably will need frequent paracentesis as an outpatient. - Consult about alcohol abstinence  7. Thrombocytopenia: Monitor platelets specially now that the patient is on anticoagulation.    Discharge Instructions  Discharge Instructions    Call MD for:  difficulty breathing, headache or visual disturbances    Complete by:  As directed    Call MD for:  extreme fatigue    Complete by:  As directed    Call MD for:  hives    Complete by:  As directed    Call MD for:  persistant dizziness or light-headedness    Complete by:  As directed    Call MD for:  persistant nausea and vomiting    Complete by:  As directed    Call MD for:  severe uncontrolled pain    Complete by:  As directed    Call MD for:  temperature >100.4    Complete by:  As directed    Diet - low sodium heart healthy    Complete by:  As directed  Increase activity slowly    Complete by:  As directed      Allergies as of 02/13/2017   No Known Allergies     Medication List    TAKE these medications   bacitracin ointment Apply 1 application topically 3 (three) times daily.   camphor-menthol lotion Commonly known as:  SARNA Apply  topically as needed for itching.   docusate sodium 100 MG capsule Commonly known as:  COLACE Take 1 capsule (100 mg total) by mouth 2 (two) times daily.   enoxaparin 60 MG/0.6ML injection Commonly known as:  LOVENOX Inject 0.45 mLs (45 mg total) into the skin daily.   enoxaparin Kit Commonly known as:  LOVENOX 1 each by Does not apply route once.   feeding supplement (ENSURE ENLIVE) Liqd Take 237 mLs by mouth 3 (three) times daily between meals.   folic acid 1 MG tablet Commonly known as:  FOLVITE Take 1 tablet (1 mg total) by mouth daily.   furosemide 20 MG tablet Commonly known as:  LASIX Take 3 tablets (60 mg total) by mouth daily.   lactulose 10 GM/15ML solution Commonly known as:  CHRONULAC Take 30 mLs (20 g total) by mouth 2 (two) times daily.   multivitamin with minerals Tabs tablet Take 1 tablet by mouth daily.   ondansetron 4 MG tablet Commonly known as:  ZOFRAN Take 1 tablet (4 mg total) by mouth every 8 (eight) hours as needed for nausea or vomiting.   pantoprazole 40 MG tablet Commonly known as:  PROTONIX Take 1 tablet (40 mg total) by mouth 2 (two) times daily.   prednisoLONE 15 MG/5ML Soln Commonly known as:  PRELONE Take 13.3 mLs (40 mg total) by mouth daily before breakfast. Continue until tapered by GI.   psyllium 95 % Pack Commonly known as:  HYDROCIL/METAMUCIL Take 1 packet by mouth daily.   spironolactone 50 MG tablet Commonly known as:  ALDACTONE Take 3 tablets (150 mg total) by mouth daily.   thiamine 100 MG tablet Take 1 tablet (100 mg total) by mouth daily.   traZODone 50 MG tablet Commonly known as:  DESYREL Take 1/2 tablet HS to start Increase to 1-2 tablets HS as needed for sleep      Follow-up Information    Martinique, Betty G, MD Follow up in 1 week(s).   Specialty:  Family Medicine Contact information: [place] [address] [phone]        Ladene Artist, MD Follow up.   Specialty:   Gastroenterology Why:  keep scheduled appointment with [facility] with repeat LFTs and CBC Contact information: [address] [place] [address] [phone]        [facility] Follow up.   Why:  Home health Physical Therapy, aide, RN and Occupational Therapy Contact information: [place] [address] [phone]          No Known Allergies  Consultations:  GI   Procedures/Studies: Ct Head Wo Contrast  Result Date: 01/19/2017 CLINICAL DATA:  Status post fall.  Laceration to the forehead. EXAM: CT HEAD WITHOUT CONTRAST TECHNIQUE: Contiguous axial images were obtained from the base of the skull through the vertex without intravenous contrast. COMPARISON:  None. FINDINGS: Brain: No evidence of acute infarction, hemorrhage, extra-axial collection, ventriculomegaly, or mass effect. Generalized cerebral atrophy. Periventricular white matter low attenuation likely secondary to microangiopathy. Vascular: Cerebrovascular atherosclerotic calcifications are noted. Skull: Negative for fracture or focal lesion. Sinuses/Orbits: Visualized portions of the  orbits are unremarkable. Visualized portions of the paranasal sinuses and mastoid air cells are unremarkable. Other: None. IMPRESSION: No acute intracranial pathology. Electronically Signed   By: Kathreen Devoid   On: 01/19/2017 11:48   Ct Angio Chest Pe W And/or Wo Contrast  Result Date: 02/09/2017 CLINICAL DATA:  Right lower extremity swelling. EXAM: CT ANGIOGRAPHY CHEST WITH CONTRAST TECHNIQUE: Multidetector CT imaging of the chest was performed using the standard protocol during bolus administration of intravenous contrast. Multiplanar CT image reconstructions and MIPs were obtained to evaluate the vascular anatomy. CONTRAST:  100 cc Isovue 370 IV COMPARISON:  Chest radiograph 02/03/2017 FINDINGS: Cardiovascular: There are no filling defects within the pulmonary arteries to suggest pulmonary  embolus. Thoracic aorta is normal in caliber with mild atherosclerosis. No aneurysm. The heart is normal in size. There is no pericardial effusion. Coronary artery calcifications are seen. Mediastinum/Nodes: No mediastinal, hilar, or axillary adenopathy. Questionable thickening of the distal esophagus. Visualized thyroid gland is normal. Trachea is midline and patent. Lungs/Pleura: Small right pleural effusion, likely sympathetic. Minimal adjacent right basilar atelectasis. No left pleural effusion. No pulmonary edema. No consolidation to suggest pneumonia. No pulmonary mass or dominant nodule. Upper Abdomen: Moderate volume of upper abdominal ascites. Musculoskeletal: There are no acute or suspicious osseous abnormalities. Review of the MIP images confirms the above findings. IMPRESSION: 1. No pulmonary embolus. 2. Tiny right pleural effusion, likely sympathetic. Adjacent atelectasis. 3. Mild aortic atherosclerosis and coronary artery calcifications. Aortic Atherosclerosis (ICD10-I70.0). Electronically Signed   By: Jeb Levering M.D.   On: 02/09/2017 22:20   Ir Venogram Hepatic W Hemodynamic Evaluation  Result Date: 02/02/2017 INDICATION: History of alcoholic cirrhosis and ascites. Please perform ultrasound-guided paracentesis and transjugular liver biopsy with acquisition of pressure measurements for diagnostic and therapeutic purposes. EXAM: 1. FLUOROSCOPIC GUIDED TRANSJUGULAR LIVER BIOPSY WITH ACQUISITION OF PRESSURE MEASUREMENTS 2. ULTRASOUND GUIDANCE FOR VENOUS ACCESS 3. ULTRASOUND-GUIDED PARACENTESIS COMPARISON:  CT abdomen pelvis - 12/23/2016; ultrasound-guided paracentesis - 01/31/2017; 01/18/2017 MEDICATIONS: None CONTRAST:  20 cc Isovue-300 ANESTHESIA/SEDATION: Moderate (conscious) sedation was employed during this procedure. A total of Versed 3 mg and Fentanyl 75 mcg was administered intravenously. Moderate Sedation Time: 35 minutes. The patient's level of consciousness and vital signs were  monitored continuously by radiology nursing throughout the procedure under my direct supervision. FLUOROSCOPY TIME:  5 minutes 48 seconds (46 mGy) COMPLICATIONS: None immediate. TECHNIQUE: Informed written consent was obtained from the patient after a discussion of the risks, benefits and alternatives to treatment. Questions regarding the procedure were encouraged and answered. A timeout was performed prior to the initiation of the procedure. Ultrasound scanning of the right lower abdominal quadrant demonstrated a moderate amount of intra-abdominal ascites. The skin overlying the lateral aspect of the right mid/lower abdomen was prepped and draped in usual sterile fashion. The overlying skin was anesthetized with 1% lidocaine. This allowed for placement of a Safe-T-Centesis catheter. The paracentesis was performed yielding 2700 cc of dark yellow serous ascitic fluid. The Safe-T-Centesis catheter was removed intact and a dressing was placed. Attention was now paid towards the transjugular liver biopsy. The right neck was prepped and draped in the usual sterile fashion, and a sterile drape was applied covering the operative field. Maximum barrier sterile technique with sterile gowns and gloves were used for the procedure. A timeout was performed prior to the initiation of the procedure. Local anesthesia was provided with 1% lidocaine with epinephrine. The right internal jugular vein was accessed with a micropuncture kit under direct ultrasound guidance. An ultrasound  image was saved for documentation purposes. The micropuncture sheath was exchanged for a 9-French vascular sheath over a Benson wire. A 100 cm Bernstein catheter was advanced over a stiff Glidewire to select the middle hepatic vein. A hepatic venogram was performed. Free hepatic and right atrial pressures were obtained with the Berenstein catheter. The Berenstein catheter was exchanged for a Dextera transjugular biopsy sheath. 3 core needle biopsies were  obtained and anterior to the middle hepatic vein under direct fluoroscopic guidance. Samples were deemed adequate, placed in formalin and submitted to pathology. At this point the procedure was terminated. All wires, catheters and sheaths removed and the patient. Hemostasis was achieved at the right neck access site with manual compression. A dressing was placed. The patient tolerated the procedure well without immediate postprocedural complication. FINDINGS: Normal hepatic venogram.  Pressure measurements as follows: Free hepatic mean pressure - 12 mmHg Wedged hepatic mean pressure - 29 mmHg Gradient 17 Successful acquisition of 3 core needle biopsies anterior to the middle hepatic vein. IMPRESSION: 1. Portosystemic gradient of 17 compatible with portal venous hypertension. 2. Successful fluoroscopic guided transjugular liver biopsy. 3. Normal hepatic venogram. PLAN: Unfortunately current MELD score of 21 suggests patient would be a poor candidate for potential TIPS creation for recurrent symptomatic ascites. Would recommend continued medical optimization and if MELD score were to improve (ideally < 14) , the patient could be referred to the Interventional Radiology Clinic [phone]) for formal TIPS consultation as indicated. Electronically Signed   By: Sandi Mariscal M.D.   On: 02/02/2017 15:09   Ir Transcatheter Bx  Result Date: 02/02/2017 INDICATION: History of alcoholic cirrhosis and ascites. Please perform ultrasound-guided paracentesis and transjugular liver biopsy with acquisition of pressure measurements for diagnostic and therapeutic purposes. EXAM: 1. FLUOROSCOPIC GUIDED TRANSJUGULAR LIVER BIOPSY WITH ACQUISITION OF PRESSURE MEASUREMENTS 2. ULTRASOUND GUIDANCE FOR VENOUS ACCESS 3. ULTRASOUND-GUIDED PARACENTESIS COMPARISON:  CT abdomen pelvis - 12/23/2016; ultrasound-guided paracentesis - 01/31/2017; 01/18/2017 MEDICATIONS: None CONTRAST:  20 cc Isovue-300 ANESTHESIA/SEDATION: Moderate (conscious)  sedation was employed during this procedure. A total of Versed 3 mg and Fentanyl 75 mcg was administered intravenously. Moderate Sedation Time: 35 minutes. The patient's level of consciousness and vital signs were monitored continuously by radiology nursing throughout the procedure under my direct supervision. FLUOROSCOPY TIME:  5 minutes 48 seconds (46 mGy) COMPLICATIONS: None immediate. TECHNIQUE: Informed written consent was obtained from the patient after a discussion of the risks, benefits and alternatives to treatment. Questions regarding the procedure were encouraged and answered. A timeout was performed prior to the initiation of the procedure. Ultrasound scanning of the right lower abdominal quadrant demonstrated a moderate amount of intra-abdominal ascites. The skin overlying the lateral aspect of the right mid/lower abdomen was prepped and draped in usual sterile fashion. The overlying skin was anesthetized with 1% lidocaine. This allowed for placement of a Safe-T-Centesis catheter. The paracentesis was performed yielding 2700 cc of dark yellow serous ascitic fluid. The Safe-T-Centesis catheter was removed intact and a dressing was placed. Attention was now paid towards the transjugular liver biopsy. The right neck was prepped and draped in the usual sterile fashion, and a sterile drape was applied covering the operative field. Maximum barrier sterile technique with sterile gowns and gloves were used for the procedure. A timeout was performed prior to the initiation of the procedure. Local anesthesia was provided with 1% lidocaine with epinephrine. The right internal jugular vein was accessed with a micropuncture kit under direct ultrasound guidance. An ultrasound image was saved for documentation  purposes. The micropuncture sheath was exchanged for a 9-French vascular sheath over a Benson wire. A 100 cm Bernstein catheter was advanced over a stiff Glidewire to select the middle hepatic vein. A hepatic  venogram was performed. Free hepatic and right atrial pressures were obtained with the Berenstein catheter. The Berenstein catheter was exchanged for a Dextera transjugular biopsy sheath. 3 core needle biopsies were obtained and anterior to the middle hepatic vein under direct fluoroscopic guidance. Samples were deemed adequate, placed in formalin and submitted to pathology. At this point the procedure was terminated. All wires, catheters and sheaths removed and the patient. Hemostasis was achieved at the right neck access site with manual compression. A dressing was placed. The patient tolerated the procedure well without immediate postprocedural complication. FINDINGS: Normal hepatic venogram.  Pressure measurements as follows: Free hepatic mean pressure - 12 mmHg Wedged hepatic mean pressure - 29 mmHg Gradient 17 Successful acquisition of 3 core needle biopsies anterior to the middle hepatic vein. IMPRESSION: 1. Portosystemic gradient of 17 compatible with portal venous hypertension. 2. Successful fluoroscopic guided transjugular liver biopsy. 3. Normal hepatic venogram. PLAN: Unfortunately current MELD score of 21 suggests patient would be a poor candidate for potential TIPS creation for recurrent symptomatic ascites. Would recommend continued medical optimization and if MELD score were to improve (ideally < 14) , the patient could be referred to the Interventional Radiology Clinic [phone]) for formal TIPS consultation as indicated. Electronically Signed   By: Sandi Mariscal M.D.   On: 02/02/2017 15:09   US Paracentesis  Result Date: 01/31/2017 INDICATION: Alcoholic hepatitis, portal hypertension, recurrent ascites. Request made for diagnostic and therapeutic paracentesis. EXAM: ULTRASOUND GUIDED DIAGNOSTIC AND THERAPEUTIC PARACENTESIS MEDICATIONS: None. COMPLICATIONS: None immediate. PROCEDURE: Informed written consent was obtained from the patient after a discussion of the risks, benefits and alternatives  to treatment. A timeout was performed prior to the initiation of the procedure. Initial ultrasound scanning demonstrates a large amount of ascites within the right lower abdominal quadrant. The right lower abdomen was prepped and draped in the usual sterile fashion. 1% lidocaine was used for local anesthesia. Following this, a Yueh catheter was introduced. An ultrasound image was saved for documentation purposes. The paracentesis was performed. The catheter was removed and a dressing was applied. The patient tolerated the procedure well without immediate post procedural complication. FINDINGS: A total of approximately 5.5 liters of yellow fluid was removed. Samples were sent to the laboratory as requested by the clinical team. IMPRESSION: Successful ultrasound-guided diagnostic and therapeutic paracentesis yielding 5.5 liters of peritoneal fluid. Read by: Rowe Robert, PA-C Electronically Signed   By: Corrie Mckusick D.O.   On: 01/31/2017 14:47   US Paracentesis  Result Date: 01/18/2017 INDICATION: Alcoholic hepatitis, recurrent ascites. Request made for diagnostic and therapeutic paracentesis up to 4 liters. EXAM: ULTRASOUND GUIDED DIAGNOSTIC AND THERAPEUTIC PARACENTESIS MEDICATIONS: None. COMPLICATIONS: None immediate. PROCEDURE: Informed written consent was obtained from the patient after a discussion of the risks, benefits and alternatives to treatment. A timeout was performed prior to the initiation of the procedure. Initial ultrasound scanning demonstrates a moderate-to-large amount of ascites within the right lower abdominal quadrant. The right lower abdomen was prepped and draped in the usual sterile fashion. 1% lidocaine was used for local anesthesia. Following this, a Yueh catheter was introduced. An ultrasound image was saved for documentation purposes. The paracentesis was performed. The catheter was removed and a dressing was applied. The patient tolerated the procedure well without immediate post  procedural complication. FINDINGS: A  total of approximately 4 liters of yellow fluid was removed. Samples were sent to the laboratory as requested by the clinical team. IMPRESSION: Successful ultrasound-guided diagnostic and therapeutic paracentesis yielding 4 liters of peritoneal fluid. Read by: Rowe Robert, PA-C Electronically Signed   By: Jerilynn Mages.  Shick M.D.   On: 01/18/2017 11:17   Ir US Guide Vasc Access Right  Result Date: 02/02/2017 INDICATION: History of alcoholic cirrhosis and ascites. Please perform ultrasound-guided paracentesis and transjugular liver biopsy with acquisition of pressure measurements for diagnostic and therapeutic purposes. EXAM: 1. FLUOROSCOPIC GUIDED TRANSJUGULAR LIVER BIOPSY WITH ACQUISITION OF PRESSURE MEASUREMENTS 2. ULTRASOUND GUIDANCE FOR VENOUS ACCESS 3. ULTRASOUND-GUIDED PARACENTESIS COMPARISON:  CT abdomen pelvis - 12/23/2016; ultrasound-guided paracentesis - 01/31/2017; 01/18/2017 MEDICATIONS: None CONTRAST:  20 cc Isovue-300 ANESTHESIA/SEDATION: Moderate (conscious) sedation was employed during this procedure. A total of Versed 3 mg and Fentanyl 75 mcg was administered intravenously. Moderate Sedation Time: 35 minutes. The patient's level of consciousness and vital signs were monitored continuously by radiology nursing throughout the procedure under my direct supervision. FLUOROSCOPY TIME:  5 minutes 48 seconds (46 mGy) COMPLICATIONS: None immediate. TECHNIQUE: Informed written consent was obtained from the patient after a discussion of the risks, benefits and alternatives to treatment. Questions regarding the procedure were encouraged and answered. A timeout was performed prior to the initiation of the procedure. Ultrasound scanning of the right lower abdominal quadrant demonstrated a moderate amount of intra-abdominal ascites. The skin overlying the lateral aspect of the right mid/lower abdomen was prepped and draped in usual sterile fashion. The overlying skin was  anesthetized with 1% lidocaine. This allowed for placement of a Safe-T-Centesis catheter. The paracentesis was performed yielding 2700 cc of dark yellow serous ascitic fluid. The Safe-T-Centesis catheter was removed intact and a dressing was placed. Attention was now paid towards the transjugular liver biopsy. The right neck was prepped and draped in the usual sterile fashion, and a sterile drape was applied covering the operative field. Maximum barrier sterile technique with sterile gowns and gloves were used for the procedure. A timeout was performed prior to the initiation of the procedure. Local anesthesia was provided with 1% lidocaine with epinephrine. The right internal jugular vein was accessed with a micropuncture kit under direct ultrasound guidance. An ultrasound image was saved for documentation purposes. The micropuncture sheath was exchanged for a 9-French vascular sheath over a Benson wire. A 100 cm Bernstein catheter was advanced over a stiff Glidewire to select the middle hepatic vein. A hepatic venogram was performed. Free hepatic and right atrial pressures were obtained with the Berenstein catheter. The Berenstein catheter was exchanged for a Dextera transjugular biopsy sheath. 3 core needle biopsies were obtained and anterior to the middle hepatic vein under direct fluoroscopic guidance. Samples were deemed adequate, placed in formalin and submitted to pathology. At this point the procedure was terminated. All wires, catheters and sheaths removed and the patient. Hemostasis was achieved at the right neck access site with manual compression. A dressing was placed. The patient tolerated the procedure well without immediate postprocedural complication. FINDINGS: Normal hepatic venogram.  Pressure measurements as follows: Free hepatic mean pressure - 12 mmHg Wedged hepatic mean pressure - 29 mmHg Gradient 17 Successful acquisition of 3 core needle biopsies anterior to the middle hepatic vein.  IMPRESSION: 1. Portosystemic gradient of 17 compatible with portal venous hypertension. 2. Successful fluoroscopic guided transjugular liver biopsy. 3. Normal hepatic venogram. PLAN: Unfortunately current MELD score of 21 suggests patient would be a poor candidate for potential TIPS  creation for recurrent symptomatic ascites. Would recommend continued medical optimization and if MELD score were to improve (ideally < 14) , the patient could be referred to the Interventional Radiology Clinic [phone]) for formal TIPS consultation as indicated. Electronically Signed   By: Sandi Mariscal M.D.   On: 02/02/2017 15:09   Dg Chest Port 1 View  Result Date: 02/03/2017 CLINICAL DATA:  Leukocytosis EXAM: PORTABLE CHEST 1 VIEW COMPARISON:  01/31/2017 FINDINGS: Normal heart size and mediastinal contours. No acute infiltrate or edema. No effusion or pneumothorax. No acute osseous findings. IMPRESSION: Negative portable chest. Electronically Signed   By: Monte Fantasia M.D.   On: 02/03/2017 09:41   Dg Chest Port 1 View  Result Date: 01/31/2017 CLINICAL DATA:  Sepsis EXAM: PORTABLE CHEST 1 VIEW COMPARISON:  None. FINDINGS: No focal consolidation or pleural effusion. Normal heart size. No pneumothorax. Circular opacity over the right neck is likely an artifact. IMPRESSION: Low lung volumes.  No acute infiltrate or edema. Electronically Signed   By: Donavan Foil M.D.   On: 01/31/2017 02:02   Ir Paracentesis  Result Date: 02/02/2017 INDICATION: History of alcoholic cirrhosis and ascites. Please perform ultrasound-guided paracentesis and transjugular liver biopsy with acquisition of pressure measurements for diagnostic and therapeutic purposes. EXAM: 1. FLUOROSCOPIC GUIDED TRANSJUGULAR LIVER BIOPSY WITH ACQUISITION OF PRESSURE MEASUREMENTS 2. ULTRASOUND GUIDANCE FOR VENOUS ACCESS 3. ULTRASOUND-GUIDED PARACENTESIS COMPARISON:  CT abdomen pelvis - 12/23/2016; ultrasound-guided paracentesis - 01/31/2017; 01/18/2017 MEDICATIONS:  None CONTRAST:  20 cc [id number] ANESTHESIA/SEDATION: Moderate (conscious) sedation was employed during this procedure. A total of Versed 3 mg and Fentanyl 75 mcg was administered intravenously. Moderate Sedation Time: 35 minutes. The patient's level of consciousness and vital signs were monitored continuously by radiology nursing throughout the procedure under my direct supervision. FLUOROSCOPY TIME:  5 minutes 48 seconds (46 mGy) COMPLICATIONS: None immediate. TECHNIQUE: Informed written consent was obtained from the patient after a discussion of the risks, benefits and alternatives to treatment. Questions regarding the procedure were encouraged and answered. A timeout was performed prior to the initiation of the procedure. Ultrasound scanning of the right lower abdominal quadrant demonstrated a moderate amount of intra-abdominal ascites. The skin overlying the lateral aspect of the right mid/lower abdomen was prepped and draped in usual sterile fashion. The overlying skin was anesthetized with 1% lidocaine. This allowed for placement of a Safe-T-Centesis catheter. The paracentesis was performed yielding 2700 cc of dark yellow serous ascitic fluid. The Safe-T-Centesis catheter was removed intact and a dressing was placed. Attention was now paid towards the transjugular liver biopsy. The right neck was prepped and draped in the usual sterile fashion, and a sterile drape was applied covering the operative field. Maximum barrier sterile technique with sterile gowns and gloves were used for the procedure. A timeout was performed prior to the initiation of the procedure. Local anesthesia was provided with 1% lidocaine with epinephrine. The right internal jugular vein was accessed with a micropuncture kit under direct ultrasound guidance. An ultrasound image was saved for documentation purposes. The micropuncture sheath was exchanged for a 9-French vascular sheath over a Benson wire. A 100 cm Bernstein catheter was  advanced over a stiff Glidewire to select the middle hepatic vein. A hepatic venogram was performed. Free hepatic and right atrial pressures were obtained with the Berenstein catheter. The Berenstein catheter was exchanged for a Dextera transjugular biopsy sheath. 3 core needle biopsies were obtained and anterior to the middle hepatic vein under direct fluoroscopic guidance. Samples were deemed adequate, placed in  formalin and submitted to pathology. At this point the procedure was terminated. All wires, catheters and sheaths removed and the patient. Hemostasis was achieved at the right neck access site with manual compression. A dressing was placed. The patient tolerated the procedure well without immediate postprocedural complication. FINDINGS: Normal hepatic venogram.  Pressure measurements as follows: Free hepatic mean pressure - 12 mmHg Wedged hepatic mean pressure - 29 mmHg Gradient 17 Successful acquisition of 3 core needle biopsies anterior to the middle hepatic vein. IMPRESSION: 1. Portosystemic gradient of 17 compatible with portal venous hypertension. 2. Successful fluoroscopic guided transjugular liver biopsy. 3. Normal hepatic venogram. PLAN: Unfortunately current MELD score of 21 suggests patient would be a poor candidate for potential TIPS creation for recurrent symptomatic ascites. Would recommend continued medical optimization and if MELD score were to improve (ideally < 14) , the patient could be referred to the Interventional Radiology Clinic [phone]) for formal TIPS consultation as indicated. Electronically Signed   By: Sandi Mariscal M.D.   On: 02/02/2017 15:09      Subjective: Patient seen and examined at bedside. She feels slightly better and wants to go home. No overnight fever. She felt slightly short of breath and nauseous last night which is resolved.  Discharge Exam: Vitals:   02/12/17 2102 02/13/17 0606  BP: 112/79 115/80  Pulse: 87 89  Resp: 17 18  Temp: 97.8 F (36.6 C)  97.7 F (36.5 C)   Vitals:   02/12/17 0537 02/12/17 1358 02/12/17 2102 02/13/17 0606  BP: 98/69 119/82 112/79 115/80  Pulse: 70 84 87 89  Resp: '18 18 17 18  ' Temp: 97.9 F (36.6 C) 98.9 F (37.2 C) 97.8 F (36.6 C) 97.7 F (36.5 C)  TempSrc: Oral  Oral Oral  SpO2: 99% 99% 99% 99%  Weight:      Height:        General: Pt is alert, awake, not in acute distress Cardiovascular: RRR, S1/S2 + Respiratory: Actually decreased breath sounds at bases Abdominal: Soft, distended but nontender. Bowel sounds heard Extremities:no cyanosis; right lower extremity 1-2+ pitting edema    The results of significant diagnostics from this hospitalization (including imaging, microbiology, ancillary and laboratory) are listed below for reference.     Microbiology: Recent Results (from the past 240 hour(s))  Blood culture (routine x 2)     Status: None (Preliminary result)   Collection Time: 02/10/17 12:02 AM  Result Value Ref Range Status   Specimen Description BLOOD LEFT FOREARM  Final   Special Requests   Final    BOTTLES DRAWN AEROBIC AND ANAEROBIC Blood Culture adequate volume   Culture   Final    NO GROWTH 3 DAYS Performed at [facility], [address], [place], [place] [address]    Report Status PENDING  Incomplete  Blood culture (routine x 2)     Status: None (Preliminary result)   Collection Time: 02/10/17 12:07 AM  Result Value Ref Range Status   Specimen Description BLOOD LEFT WRIST  Final   Special Requests IN PEDIATRIC BOTTLE Blood Culture adequate volume  Final   Culture   Final    NO GROWTH 3 DAYS Performed at [facility], [place] [address], [place], [place] [address]    Report Status PENDING  Incomplete     Labs: BNP (last 3 results)  Recent Labs  02/09/17 1823  BNP 056.9*   Basic Metabolic Panel:  Recent Labs Lab 02/09/17 1823 02/11/17 0207 02/12/17 0501 02/13/17 0510  NA 132* 137 136 133*  K 5.0 3.8 4.3 3.9  CL 96* 104 100* 99*  CO2 '27 26  29 27  ' GLUCOSE 117* 109* 84 88  BUN 34* 26* 18 25*  CREATININE 0.78 0.47 0.40* 0.40*  CALCIUM 8.3* 8.1* 8.5* 8.3*  MG  --  2.1 1.9 1.8   Liver Function Tests:  Recent Labs Lab 02/09/17 1823 02/10/17 1011 02/11/17 0207 02/12/17 0501 02/13/17 0510  AST 92*  --  72* 67* 62*  ALT 94*  --  73* 74* 67*  ALKPHOS 587*  --  446* 497* 462*  BILITOT 7.9*  --  6.3* 5.3* 4.9*  PROT 5.2* 4.9* 4.5* 5.0* 4.7*  ALBUMIN 2.4*  --  2.3* 2.3* 2.3*   No results for input(s): LIPASE, AMYLASE in the last 168 hours. No results for input(s): AMMONIA in the last 168 hours. CBC:  Recent Labs Lab 02/09/17 1823 02/10/17 1011 02/11/17 0207 02/12/17 0501 02/13/17 0510  WBC 43.2* 37.4* 32.9* 30.3* 32.5*  NEUTROABS 41.1*  --   --   --  28.9*  HGB 10.0* 9.3* 8.6* 9.4* 8.8*  HCT 27.9* 26.0* 23.8* 27.0* 25.3*  MCV 102.2* 102.4* 102.6* 105.5* 103.7*  PLT 181 169 149* 146* 168   Cardiac Enzymes: No results for input(s): CKTOTAL, CKMB, CKMBINDEX, TROPONINI in the last 168 hours. BNP: Invalid input(s): POCBNP CBG: No results for input(s): GLUCAP in the last 168 hours. D-Dimer No results for input(s): DDIMER in the last 72 hours. Hgb A1c No results for input(s): HGBA1C in the last 72 hours. Lipid Profile No results for input(s): CHOL, HDL, LDLCALC, TRIG, CHOLHDL, LDLDIRECT in the last 72 hours. Thyroid function studies No results for input(s): TSH, T4TOTAL, T3FREE, THYROIDAB in the last 72 hours.  Invalid input(s): FREET3 Anemia work up No results for input(s): VITAMINB12, FOLATE, FERRITIN, TIBC, IRON, RETICCTPCT in the last 72 hours. Urinalysis    Component Value Date/Time   COLORURINE AMBER (A) 02/03/2017 1025   APPEARANCEUR CLEAR 02/03/2017 1025   LABSPEC <1.005 (L) 02/03/2017 1025   PHURINE 6.0 02/03/2017 1025   GLUCOSEU NEGATIVE 02/03/2017 1025   HGBUR NEGATIVE 02/03/2017 1025   HGBUR large 04/13/2010 1228   BILIRUBINUR LARGE (A) 02/03/2017 1025   KETONESUR NEGATIVE 02/03/2017 1025    PROTEINUR NEGATIVE 02/03/2017 1025   UROBILINOGEN 0.2 04/13/2010 1228   NITRITE NEGATIVE 02/03/2017 1025   LEUKOCYTESUR NEGATIVE 02/03/2017 1025   Sepsis Labs Invalid input(s): PROCALCITONIN,  WBC,  LACTICIDVEN Microbiology Recent Results (from the past 240 hour(s))  Blood culture (routine x 2)     Status: None (Preliminary result)   Collection Time: 02/10/17 12:02 AM  Result Value Ref Range Status   Specimen Description BLOOD LEFT FOREARM  Final   Special Requests   Final    BOTTLES DRAWN AEROBIC AND ANAEROBIC Blood Culture adequate volume   Culture   Final    NO GROWTH 3 DAYS Performed at [facility], [place] [address], [place], [place] [address]    Report Status PENDING  Incomplete  Blood culture (routine x 2)     Status: None (Preliminary result)   Collection Time: 02/10/17 12:07 AM  Result Value Ref Range Status   Specimen Description BLOOD LEFT WRIST  Final   Special Requests IN PEDIATRIC BOTTLE Blood Culture adequate volume  Final   Culture   Final    NO GROWTH 3 DAYS Performed at [facility], [place] [address] [place], [place] [address]    Report Status  PENDING  Incomplete     Time coordinating discharge: 35 minutes  SIGNED:   Aline August, MD  [facility] 02/13/2017, 12:40 PM Pager: [phone]  If 7PM-7AM, please contact night-coverage [email or web address] Password [email or web address]

## 2017-02-13 NOTE — Progress Notes (Signed)
Assessment unchanged. Pt and husband verbalized  understanding of dc instructions through teach back including follow up care and when to call the doctor. Script x 1 given as provided by MD. Case Manager completed home health arrangements. To continue Lovenox injections at home. Pt and husband verbalized understanding of how to administer med. HHRN to follow up and assist post discharge. Discharged via wc to front entrance accompanied by NT and husband.

## 2017-02-13 NOTE — Discharge Instructions (Signed)
Deep Vein Thrombosis A deep vein thrombosis (DVT) is a blood clot (thrombus) that usually occurs in a deep, larger vein of the lower leg or the pelvis, or in an upper extremity such as the arm. These are dangerous and can lead to serious and even life-threatening complications if the clot travels to the lungs. A DVT can damage the valves in your leg veins so that instead of flowing upward, the blood pools in the lower leg. This is called post-thrombotic syndrome, and it can result in pain, swelling, discoloration, and sores on the leg. What are the causes? A DVT is caused by the formation of a blood clot in your leg, pelvis, or arm. Usually, several things contribute to the formation of blood clots. A clot may develop when:  Your blood flow slows down.  Your vein becomes damaged in some way.  You have a condition that makes your blood clot more easily.  What increases the risk? A DVT is more likely to develop in:  People who are older, especially over 60 years of age.  People who are overweight (obese).  People who sit or lie still for a long time, such as during long-distance travel (over 4 hours), bed rest, hospitalization, or during recovery from certain medical conditions like a stroke.  People who do not engage in much physical activity (sedentary lifestyle).  People who have chronic breathing disorders.  People who have a personal or family history of blood clots or blood clotting disease.  People who have peripheral vascular disease (PVD), diabetes, or some types of cancer.  People who have heart disease, especially if the person had a recent heart attack or has congestive heart failure.  People who have neurological diseases that affect the legs (leg paresis).  People who have had a traumatic injury, such as breaking a hip or leg.  People who have recently had major or lengthy surgery, especially on the hip, knee, or abdomen.  People who have had a central line placed  inside a large vein.  People who take medicines that contain the hormone estrogen. These include birth control pills and hormone replacement therapy.  Pregnancy or during childbirth or the postpartum period.  Long plane flights (over 8 hours).  What are the signs or symptoms?  Symptoms of a DVT can include:  Swelling of your leg or arm, especially if one side is much worse.  Warmth and redness of your leg or arm, especially if one side is much worse.  Pain in your arm or leg. If the clot is in your leg, symptoms may be more noticeable or worse when you stand or walk.  A feeling of pins and needles, if the clot is in the arm.  The symptoms of a DVT that has traveled to the lungs (pulmonary embolism, PE) usually start suddenly and include:  Shortness of breath while active or at rest.  Coughing or coughing up blood or blood-tinged mucus.  Chest pain that is often worse with deep breaths.  Rapid or irregular heartbeat.  Feeling light-headed or dizzy.  Fainting.  Feeling anxious.  Sweating.  There may also be pain and swelling in a leg if that is where the blood clot started. These symptoms may represent a serious problem that is an emergency. Do not wait to see if the symptoms will go away. Get medical help right away. Call your local emergency services (911 in the [place] Do not drive yourself to the hospital. How is this diagnosed? Your health   care provider will take a medical history and perform a physical exam. You may also have other tests, including:  Blood tests to assess the clotting properties of your blood.  Imaging tests, such as CT, ultrasound, MRI, X-ray, and other tests to see if you have clots anywhere in your body.  How is this treated? After a DVT is identified, it can be treated. The type of treatment that you receive depends on many factors, such as the cause of your DVT, your risk for bleeding or developing more clots, and other medical conditions that  you have. Sometimes, a combination of treatments is necessary. Treatment options may be combined and include:  Monitoring the blood clot with ultrasound.  Taking medicines by mouth, such as newer blood thinners (anticoagulants), thrombolytics, or warfarin.  Taking anticoagulant medicine by injection or through an IV tube.  Wearing compression stockings or using different types ofdevices.  Surgery (rare) to remove the blood clot or to place a filter in your abdomen to stop the blood clot from traveling to your lungs.  Treatments for a DVT are often divided into immediate treatment and long-term treatment (up to 3 months after DVT). You can work with your health care provider to choose the treatment program that is best for you. Follow these instructions at home: If you are taking a newer oral anticoagulant:  Take the medicine every single day at the same time each day.  Understand what foods and drugs interact with this medicine.  Understand that there are no regular blood tests required when using this medicine.  Understand the side effects of this medicine, including excessive bruising or bleeding. Ask your health care provider or pharmacist about other possible side effects. If you are taking warfarin:  Understand how to take warfarin and know which foods can affect how warfarin works in your body.  Understand that it is dangerous to take too much or too little warfarin. Too much warfarin increases the risk of bleeding. Too little warfarin continues to allow the risk for blood clots.  Follow your PT and INR blood testing schedule. The PT and INR results allow your health care provider to adjust your dose of warfarin. It is very important that you have your PT and INR tested as often as told by your health care provider.  Avoid major changes in your diet, or tell your health care provider before you change your diet. Arrange a visit with a registered dietitian to answer your  questions. Many foods, especially foods that are high in vitamin K, can interfere with warfarin and affect the PT and INR results. Eat a consistent amount of foods that are high in vitamin K, such as: ? Spinach, kale, broccoli, cabbage, collard greens, turnip greens, Brussels sprouts, peas, cauliflower, seaweed, and parsley. ? Beef liver and pork liver. ? Green tea. ? Soybean oil.  Tell your health care provider about any and all medicines, vitamins, and supplements that you take, including aspirin and other over-the-counter anti-inflammatory medicines. Be especially cautious with aspirin and anti-inflammatory medicines. Do not take those before you ask your health care provider if it is safe to do so. This is important because many medicines can interfere with warfarin and affect the PT and INR results.  Do not start or stop taking any over-the-counter or prescription medicine unless your health care provider or pharmacist tells you to do so. If you take warfarin, you will also need to do these things:  Hold pressure over cuts for longer than   usual.  Tell your dentist and other health care providers that you are taking warfarin before you have any procedures in which bleeding may occur.  Avoid alcohol or drink very small amounts. Tell your health care provider if you change your alcohol intake.  Do not use tobacco products, including cigarettes, chewing tobacco, and e-cigarettes. If you need help quitting, ask your health care provider.  Avoid contact sports.  General instructions  Take over-the-counter and prescription medicines only as told by your health care provider. Anticoagulant medicines can have side effects, including easy bruising and difficulty stopping bleeding. If you are prescribed an anticoagulant, you will also need to do these things: ? Hold pressure over cuts for longer than usual. ? Tell your dentist and other health care providers that you are taking anticoagulants  before you have any procedures in which bleeding may occur. ? Avoid contact sports.  Wear a medical alert bracelet or carry a medical alert card that says you have had a PE.  Ask your health care provider how soon you can go back to your normal activities. Stay active to prevent new blood clots from forming.  Make sure to exercise while traveling or when you have been sitting or standing for a long period of time. It is very important to exercise. Exercise your legs by walking or by tightening and relaxing your leg muscles often. Take frequent walks.  Wear compression stockings as told by your health care provider to help prevent more blood clots from forming.  Do not use tobacco products, including cigarettes, chewing tobacco, and e-cigarettes. If you need help quitting, ask your health care provider.  Keep all follow-up appointments with your health care provider. This is important. How is this prevented? Take these actions to decrease your risk of developing another DVT:  Exercise regularly. For at least 30 minutes every day, engage in: ? Activity that involves moving your arms and legs. ? Activity that encourages good blood flow through your body by increasing your heart rate.  Exercise your arms and legs every hour during long-distance travel (over 4 hours). Drink plenty of water and avoid drinking alcohol while traveling.  Avoid sitting or lying in bed for long periods of time without moving your legs.  Maintain a weight that is appropriate for your height. Ask your health care provider what weight is healthy for you.  If you are a woman who is over 35 years of age, avoid unnecessary use of medicines that contain estrogen. These include birth control pills.  Do not smoke, especially if you take estrogen medicines. If you need help quitting, ask your health care provider.  If you are hospitalized, prevention measures may include:  Early walking after surgery, as soon as your  health care provider says that it is safe.  Receiving anticoagulants to prevent blood clots.If you cannot take anticoagulants, other options may be available, such as wearing compression stockings or using different types of devices.  Get help right away if:  You have new or increased pain, swelling, or redness in an arm or leg.  You have numbness or tingling in an arm or leg.  You have shortness of breath while active or at rest.  You have chest pain.  You have a rapid or irregular heartbeat.  You feel light-headed or dizzy.  You cough up blood.  You notice blood in your vomit, bowel movement, or urine. These symptoms may represent a serious problem that is an emergency. Do not wait to see   if the symptoms will go away. Get medical help right away. Call your local emergency services (911 in the [place] Do not drive yourself to the hospital. This information is not intended to replace advice given to you by your health care provider. Make sure you discuss any questions you have with your health care provider. Document Released: 08/09/2005 Document Revised: 01/15/2016 Document Reviewed: 12/04/2014 Elsevier Interactive Patient Education  2017 [employer]  

## 2017-02-14 ENCOUNTER — Other Ambulatory Visit (HOSPITAL_COMMUNITY): Payer: 59 | Attending: Psychiatry | Admitting: Psychology

## 2017-02-14 DIAGNOSIS — F1020 Alcohol dependence, uncomplicated: Secondary | ICD-10-CM | POA: Diagnosis not present

## 2017-02-14 DIAGNOSIS — K7031 Alcoholic cirrhosis of liver with ascites: Secondary | ICD-10-CM

## 2017-02-14 DIAGNOSIS — G4700 Insomnia, unspecified: Secondary | ICD-10-CM | POA: Diagnosis not present

## 2017-02-14 DIAGNOSIS — F1210 Cannabis abuse, uncomplicated: Secondary | ICD-10-CM | POA: Diagnosis not present

## 2017-02-15 ENCOUNTER — Telehealth: Payer: Self-pay | Admitting: Family Medicine

## 2017-02-15 LAB — CULTURE, BLOOD (ROUTINE X 2)
CULTURE: NO GROWTH
Culture: NO GROWTH
Special Requests: ADEQUATE
Special Requests: ADEQUATE

## 2017-02-15 NOTE — Telephone Encounter (Signed)
Mercedes Mitchell would like verbal order for skilled nursing. Mercedes Mitchell is requesting twice a wk this week and then once for 7 weeks  Pt was release from hospital and she on Lovenox injection due to DVT and pt has small laceration on forehead

## 2017-02-15 NOTE — Telephone Encounter (Signed)
It is Ok to authorize requested service.  Thanks, BJ

## 2017-02-15 NOTE — Telephone Encounter (Signed)
Verbal okay? 

## 2017-02-16 ENCOUNTER — Encounter (HOSPITAL_COMMUNITY): Payer: Self-pay | Admitting: Psychology

## 2017-02-16 ENCOUNTER — Other Ambulatory Visit (HOSPITAL_COMMUNITY): Payer: Self-pay | Admitting: Medical

## 2017-02-16 ENCOUNTER — Other Ambulatory Visit (HOSPITAL_COMMUNITY): Payer: 59 | Admitting: Psychology

## 2017-02-16 ENCOUNTER — Telehealth: Payer: Self-pay | Admitting: Family Medicine

## 2017-02-16 DIAGNOSIS — F1210 Cannabis abuse, uncomplicated: Secondary | ICD-10-CM

## 2017-02-16 DIAGNOSIS — F1020 Alcohol dependence, uncomplicated: Secondary | ICD-10-CM

## 2017-02-16 MED ORDER — FOLIC ACID 1 MG PO TABS: 1.0000 mg | ORAL_TABLET | Freq: Every day | ORAL | 5 refills | Status: DC

## 2017-02-16 MED ORDER — QUETIAPINE FUMARATE 25 MG PO TABS: 25.0000 mg | ORAL_TABLET | Freq: Every day | ORAL | 2 refills | Status: DC
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
## 2017-02-16 NOTE — Progress Notes (Signed)
Pt returns from Hospitalization for alcoholic cirrhosis with Ascites.Good attitude.Physically still debilitated but improving.Goes to [place] next month for evaluation of need for Liver transplant? Also c/o 2 hrs sleep on 125 mg of Trazodone then awake for rest of night.Will trty Seroquel-rx sent to pharmacy Reports problems getting Folic acid rx-resenr rx for same today

## 2017-02-16 NOTE — Telephone Encounter (Signed)
Mercedes Mitchell needs verbal order for PT once a wk for 1 wk and then twice a wk for 3 wks

## 2017-02-16 NOTE — Telephone Encounter (Signed)
Verbal given 

## 2017-02-17 ENCOUNTER — Encounter: Payer: Self-pay | Admitting: *Deleted

## 2017-02-17 ENCOUNTER — Other Ambulatory Visit (HOSPITAL_COMMUNITY): Payer: 59 | Admitting: Psychology

## 2017-02-17 ENCOUNTER — Encounter (HOSPITAL_COMMUNITY): Payer: Self-pay | Admitting: Psychology

## 2017-02-17 DIAGNOSIS — F1210 Cannabis abuse, uncomplicated: Secondary | ICD-10-CM

## 2017-02-17 DIAGNOSIS — F1020 Alcohol dependence, uncomplicated: Secondary | ICD-10-CM | POA: Diagnosis not present

## 2017-02-17 DIAGNOSIS — K7031 Alcoholic cirrhosis of liver with ascites: Secondary | ICD-10-CM

## 2017-02-17 NOTE — Telephone Encounter (Signed)
Mercedes Mitchell notified that this is okay. Thanks!

## 2017-02-17 NOTE — Telephone Encounter (Signed)
It is Ok to start PT as requested. Thanks, BJ

## 2017-02-18 ENCOUNTER — Observation Stay (HOSPITAL_COMMUNITY)
Admission: EM | Admit: 2017-02-18 | Discharge: 2017-02-19 | Disposition: A | Discharge status: Home / Self Care | Payer: Managed Care, Other (non HMO) | Attending: Internal Medicine | Admitting: Internal Medicine

## 2017-02-18 ENCOUNTER — Encounter (HOSPITAL_COMMUNITY): Payer: Self-pay | Admitting: Psychology

## 2017-02-18 ENCOUNTER — Other Ambulatory Visit (INDEPENDENT_AMBULATORY_CARE_PROVIDER_SITE_OTHER): Payer: Managed Care, Other (non HMO)

## 2017-02-18 ENCOUNTER — Encounter: Payer: Self-pay | Admitting: Internal Medicine

## 2017-02-18 ENCOUNTER — Ambulatory Visit (INDEPENDENT_AMBULATORY_CARE_PROVIDER_SITE_OTHER): Payer: Managed Care, Other (non HMO) | Admitting: Internal Medicine

## 2017-02-18 ENCOUNTER — Encounter (HOSPITAL_COMMUNITY): Payer: Self-pay | Admitting: Emergency Medicine

## 2017-02-18 VITALS — BP 100/70 | HR 112 | Ht 66.0 in | Wt 108.0 lb

## 2017-02-18 DIAGNOSIS — L89159 Pressure ulcer of sacral region, unspecified stage: Secondary | ICD-10-CM | POA: Diagnosis not present

## 2017-02-18 DIAGNOSIS — R188 Other ascites: Secondary | ICD-10-CM | POA: Diagnosis present

## 2017-02-18 DIAGNOSIS — K7011 Alcoholic hepatitis with ascites: Secondary | ICD-10-CM | POA: Insufficient documentation

## 2017-02-18 DIAGNOSIS — Z811 Family history of alcohol abuse and dependence: Secondary | ICD-10-CM | POA: Insufficient documentation

## 2017-02-18 DIAGNOSIS — Z8249 Family history of ischemic heart disease and other diseases of the circulatory system: Secondary | ICD-10-CM | POA: Diagnosis not present

## 2017-02-18 DIAGNOSIS — D72829 Elevated white blood cell count, unspecified: Secondary | ICD-10-CM | POA: Insufficient documentation

## 2017-02-18 DIAGNOSIS — R933 Abnormal findings on diagnostic imaging of other parts of digestive tract: Secondary | ICD-10-CM | POA: Diagnosis not present

## 2017-02-18 DIAGNOSIS — Z7901 Long term (current) use of anticoagulants: Secondary | ICD-10-CM | POA: Diagnosis not present

## 2017-02-18 DIAGNOSIS — K7031 Alcoholic cirrhosis of liver with ascites: Secondary | ICD-10-CM

## 2017-02-18 DIAGNOSIS — L89153 Pressure ulcer of sacral region, stage 3: Secondary | ICD-10-CM | POA: Diagnosis present

## 2017-02-18 DIAGNOSIS — I82411 Acute embolism and thrombosis of right femoral vein: Secondary | ICD-10-CM | POA: Insufficient documentation

## 2017-02-18 DIAGNOSIS — R109 Unspecified abdominal pain: Secondary | ICD-10-CM

## 2017-02-18 DIAGNOSIS — F410 Panic disorder [episodic paroxysmal anxiety] without agoraphobia: Secondary | ICD-10-CM | POA: Diagnosis not present

## 2017-02-18 DIAGNOSIS — Z8349 Family history of other endocrine, nutritional and metabolic diseases: Secondary | ICD-10-CM | POA: Diagnosis not present

## 2017-02-18 DIAGNOSIS — Z87891 Personal history of nicotine dependence: Secondary | ICD-10-CM | POA: Insufficient documentation

## 2017-02-18 DIAGNOSIS — K3189 Other diseases of stomach and duodenum: Secondary | ICD-10-CM | POA: Insufficient documentation

## 2017-02-18 DIAGNOSIS — E43 Unspecified severe protein-calorie malnutrition: Secondary | ICD-10-CM | POA: Diagnosis not present

## 2017-02-18 DIAGNOSIS — K766 Portal hypertension: Secondary | ICD-10-CM | POA: Diagnosis not present

## 2017-02-18 DIAGNOSIS — K449 Diaphragmatic hernia without obstruction or gangrene: Secondary | ICD-10-CM | POA: Diagnosis not present

## 2017-02-18 DIAGNOSIS — F1021 Alcohol dependence, in remission: Secondary | ICD-10-CM | POA: Insufficient documentation

## 2017-02-18 DIAGNOSIS — D649 Anemia, unspecified: Secondary | ICD-10-CM | POA: Insufficient documentation

## 2017-02-18 DIAGNOSIS — R1084 Generalized abdominal pain: Secondary | ICD-10-CM | POA: Diagnosis not present

## 2017-02-18 DIAGNOSIS — I82409 Acute embolism and thrombosis of unspecified deep veins of unspecified lower extremity: Secondary | ICD-10-CM | POA: Diagnosis present

## 2017-02-18 LAB — LIPASE, BLOOD: LIPASE: 61 U/L — AB (ref 11–51)

## 2017-02-18 LAB — CBC
HEMATOCRIT: 30.7 % — AB (ref 36.0–46.0)
Hemoglobin: 10.5 g/dL — ABNORMAL LOW (ref 12.0–15.0)
MCH: 36 pg — ABNORMAL HIGH (ref 26.0–34.0)
MCHC: 34.2 g/dL (ref 30.0–36.0)
MCV: 105.1 fL — ABNORMAL HIGH (ref 78.0–100.0)
Platelets: 192 10*3/uL (ref 150–400)
RBC: 2.92 MIL/uL — ABNORMAL LOW (ref 3.87–5.11)
RDW: 17.3 % — AB (ref 11.5–15.5)
WBC: 37.2 10*3/uL — AB (ref 4.0–10.5)

## 2017-02-18 LAB — COMPREHENSIVE METABOLIC PANEL
ALK PHOS: 528 U/L — AB (ref 39–117)
ALK PHOS: 558 U/L — AB (ref 38–126)
ALT: 74 U/L — ABNORMAL HIGH (ref 0–35)
ALT: 85 U/L — ABNORMAL HIGH (ref 14–54)
ANION GAP: 9 (ref 5–15)
AST: 71 U/L — ABNORMAL HIGH (ref 0–37)
AST: 88 U/L — AB (ref 15–41)
Albumin: 3.1 g/dL — ABNORMAL LOW (ref 3.5–5.2)
Albumin: 2.9 g/dL — ABNORMAL LOW (ref 3.5–5.0)
BILIRUBIN TOTAL: 6.3 mg/dL — AB (ref 0.3–1.2)
BUN: 16 mg/dL (ref 6–23)
BUN: 18 mg/dL (ref 6–20)
CALCIUM: 8.6 mg/dL — AB (ref 8.9–10.3)
CHLORIDE: 96 meq/L (ref 96–112)
CO2: 31 meq/L (ref 19–32)
CO2: 30 mmol/L (ref 22–32)
Calcium: 8.6 mg/dL (ref 8.4–10.5)
Chloride: 96 mmol/L — ABNORMAL LOW (ref 101–111)
Creatinine, Ser: 0.61 mg/dL (ref 0.40–1.20)
Creatinine, Ser: 0.63 mg/dL (ref 0.44–1.00)
GFR calc Af Amer: >60 mL/min (ref >60)
GFR: 108.32 mL/min (ref >60.00)
GLUCOSE: 141 mg/dL — AB (ref 70–99)
Glucose, Bld: 120 mg/dL — ABNORMAL HIGH (ref 65–99)
POTASSIUM: 4.2 meq/L (ref 3.5–5.1)
POTASSIUM: 4.8 mmol/L (ref 3.5–5.1)
SODIUM: 135 meq/L (ref 135–145)
Sodium: 135 mmol/L (ref 135–145)
TOTAL PROTEIN: 6 g/dL (ref 6.0–8.3)
TOTAL PROTEIN: 6.2 g/dL — AB (ref 6.5–8.1)
Total Bilirubin: 5.8 mg/dL — ABNORMAL HIGH (ref 0.2–1.2)

## 2017-02-18 LAB — URINALYSIS, ROUTINE W REFLEX MICROSCOPIC
Glucose, UA: NEGATIVE mg/dL
Hgb urine dipstick: NEGATIVE
KETONES UR: NEGATIVE mg/dL
LEUKOCYTES UA: NEGATIVE
NITRITE: NEGATIVE
PH: 7 (ref 5.0–8.0)
PROTEIN: NEGATIVE mg/dL
Specific Gravity, Urine: 1.012 (ref 1.005–1.030)

## 2017-02-18 LAB — CBC WITH DIFFERENTIAL/PLATELET
BASOS PCT: 0.1 % (ref 0.0–3.0)
Basophils Absolute: 0 10*3/uL (ref 0.0–0.1)
EOS PCT: 0.5 % (ref 0.0–5.0)
Eosinophils Absolute: 0.2 10*3/uL (ref 0.0–0.7)
HCT: 30.2 % — ABNORMAL LOW (ref 36.0–46.0)
Hemoglobin: 10.1 g/dL — ABNORMAL LOW (ref 12.0–15.0)
LYMPHS ABS: 1.6 10*3/uL (ref 0.7–4.0)
Lymphocytes Relative: 4.6 % — ABNORMAL LOW (ref 12.0–46.0)
MCHC: 33.5 g/dL (ref 30.0–36.0)
MCV: 108.8 fl — AB (ref 78.0–100.0)
MONO ABS: 1 10*3/uL (ref 0.1–1.0)
MONOS PCT: 2.9 % — AB (ref 3.0–12.0)
Neutro Abs: 31.2 10*3/uL — ABNORMAL HIGH (ref 1.4–7.7)
PLATELETS: 183 10*3/uL (ref 150.0–400.0)
RBC: 2.77 Mil/uL — ABNORMAL LOW (ref 3.87–5.11)
RDW: 15.8 % — ABNORMAL HIGH (ref 11.5–15.5)
WBC: 33.9 10*3/uL (ref 4.0–10.5)

## 2017-02-18 LAB — PROTIME-INR
INR: 1.7 ratio — ABNORMAL HIGH (ref 0.8–1.0)
Prothrombin Time: 17.8 s — ABNORMAL HIGH (ref 9.6–13.1)

## 2017-02-18 MED ORDER — ENSURE ENLIVE PO LIQD
237.0000 mL | Freq: Three times a day (TID) | ORAL | Status: DC
  Administered 2017-02-19 (×2): 237 mL via ORAL

## 2017-02-18 MED ORDER — SPIRONOLACTONE 100 MG PO TABS
200.0000 mg | ORAL_TABLET | Freq: Every day | ORAL | Status: DC
  Administered 2017-02-19: 200 mg via ORAL
  Filled 2017-02-18: qty 2

## 2017-02-18 MED ORDER — FUROSEMIDE 20 MG PO TABS: 60.0000 mg | ORAL_TABLET | Freq: Every day | ORAL | 1 refills | Status: DC

## 2017-02-18 MED ORDER — PANTOPRAZOLE SODIUM 40 MG PO TBEC
40.0000 mg | DELAYED_RELEASE_TABLET | Freq: Two times a day (BID) | ORAL | Status: DC
  Administered 2017-02-18 – 2017-02-19 (×2): 40 mg via ORAL
  Filled 2017-02-18 (×2): qty 1

## 2017-02-18 MED ORDER — ACETAMINOPHEN 325 MG PO TABS: 650.0000 mg | ORAL_TABLET | Freq: Four times a day (QID) | ORAL | Status: DC | PRN

## 2017-02-18 MED ORDER — SODIUM CHLORIDE 0.9% FLUSH
3.0000 mL | Freq: Two times a day (BID) | INTRAVENOUS | Status: DC
  Administered 2017-02-18 – 2017-02-19 (×2): 3 mL via INTRAVENOUS

## 2017-02-18 MED ORDER — ALBUMIN HUMAN 25 % IV SOLN
12.5000 g | Freq: Once | INTRAVENOUS | Status: DC
  Filled 2017-02-18: qty 50

## 2017-02-18 MED ORDER — SODIUM CHLORIDE 0.9% FLUSH: 3.0000 mL | INTRAVENOUS | Status: DC | PRN

## 2017-02-18 MED ORDER — LACTULOSE 10 GM/15ML PO SOLN: 20.0000 g | Freq: Two times a day (BID) | ORAL | 1 refills | Status: DC

## 2017-02-18 MED ORDER — TRAMADOL HCL 50 MG PO TABS
50.0000 mg | ORAL_TABLET | Freq: Four times a day (QID) | ORAL | Status: DC | PRN
  Administered 2017-02-18 – 2017-02-19 (×3): 50 mg via ORAL
  Filled 2017-02-18 (×3): qty 1

## 2017-02-18 MED ORDER — PREDNISOLONE 15 MG/5ML PO SOLN
40.0000 mg | Freq: Every day | ORAL | Status: DC
  Administered 2017-02-19: 40 mg via ORAL
  Filled 2017-02-18: qty 15

## 2017-02-18 MED ORDER — PANTOPRAZOLE SODIUM 40 MG PO TBEC: 40.0000 mg | DELAYED_RELEASE_TABLET | Freq: Two times a day (BID) | ORAL | 1 refills | Status: DC

## 2017-02-18 MED ORDER — ACETAMINOPHEN 650 MG RE SUPP: 650.0000 mg | Freq: Four times a day (QID) | RECTAL | Status: DC | PRN

## 2017-02-18 MED ORDER — LACTULOSE 10 GM/15ML PO SOLN
20.0000 g | Freq: Two times a day (BID) | ORAL | Status: DC
  Administered 2017-02-18 – 2017-02-19 (×2): 20 g via ORAL
  Filled 2017-02-18 (×2): qty 30

## 2017-02-18 MED ORDER — QUETIAPINE FUMARATE 25 MG PO TABS
25.0000 mg | ORAL_TABLET | Freq: Every evening | ORAL | Status: DC | PRN
  Administered 2017-02-18: 25 mg via ORAL
  Filled 2017-02-18: qty 1

## 2017-02-18 MED ORDER — FOLIC ACID 1 MG PO TABS
1.0000 mg | ORAL_TABLET | Freq: Every day | ORAL | Status: DC
  Administered 2017-02-19: 1 mg via ORAL
  Filled 2017-02-18: qty 1

## 2017-02-18 MED ORDER — SODIUM CHLORIDE 0.9 % IV SOLN: 250.0000 mL | INTRAVENOUS | Status: DC | PRN

## 2017-02-18 MED ORDER — ONDANSETRON HCL 4 MG/2ML IJ SOLN: 4.0000 mg | Freq: Four times a day (QID) | INTRAMUSCULAR | Status: DC | PRN

## 2017-02-18 MED ORDER — VITAMIN B-1 100 MG PO TABS
100.0000 mg | ORAL_TABLET | Freq: Every day | ORAL | Status: DC
  Administered 2017-02-19: 10:00:00 100 mg via ORAL
  Filled 2017-02-18: qty 1

## 2017-02-18 MED ORDER — FUROSEMIDE 40 MG PO TABS
80.0000 mg | ORAL_TABLET | Freq: Every day | ORAL | Status: DC
Start: 2017-02-19 — End: 2017-02-19
  Administered 2017-02-19: 80 mg via ORAL
  Filled 2017-02-18: qty 2

## 2017-02-18 MED ORDER — ONDANSETRON HCL 4 MG PO TABS: 4.0000 mg | ORAL_TABLET | Freq: Four times a day (QID) | ORAL | Status: DC | PRN

## 2017-02-18 MED ORDER — SPIRONOLACTONE 50 MG PO TABS: 150.0000 mg | ORAL_TABLET | Freq: Every day | ORAL | 1 refills | Status: DC

## 2017-02-18 NOTE — ED Provider Notes (Signed)
[facility] DEPT Provider Note   CSN: [id number] Arrival date & time: 02/18/17  1720     History   Chief Complaint Chief Complaint  Patient presents with  . Bloated  . Nausea    HPI Mercedes Mitchell is a 56 y.o. female.  Patient with history of alcoholic cirrhosis presents with nausea and severe abdominal discomfort from enlarging ascites. Patient has had 3 previous elective paracenteses since the beginning of May. She was sent by her gastroenterologist today from an appointment there. They were reportedly unable to schedule her for an outpatient paracentesis. Patient denies any chest pain, shortness of breath. No localized abdominal pain or fever. No worsening lower extremity swelling. Patient is nauseous but has not had any vomiting. The onset of this condition was gradual. The course is worsening. Aggravating factors: none. Alleviating factors: none.        Past Medical History:  Diagnosis Date  . 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  paracentesis 2.6 liters 5/4: no SBP.    Marland Kitchen Alcoholism ([place]) 12/2016  . Candida esophagitis ([place])   . Cirrhosis ([place])   . DVT (deep venous thrombosis) ([place])   . Dysphagia 2011  . Esophagitis   . Hepatic steatosis   . Hiatal hernia   . Hiatal hernia   . Macrocytic anemia 12/2016  . Malnutrition ([place]) 12/2016  . Panic type anxiety neurosis 2011  . Portal hypertension ([place])   . Portal hypertensive gastropathy [facility])     Patient Active Problem List   Diagnosis Date Noted  . Abdominal pain 02/10/2017  . Acute deep vein thrombosis (DVT) of right femoral vein ([place]) 02/10/2017  . Hyponatremia 02/10/2017  . DVT (deep venous thrombosis) ([place]) 02/10/2017  . Alcoholic cirrhosis of liver with ascites ([employer]) 01/12/2017  . GI bleeding 01/12/2017  . Current every day smoker 01/12/2017  . Ascites   . Coffee ground emesis   . Acute esophagitis   . Protein-calorie  malnutrition, severe 12/24/2016  . Acute alcoholic hepatitis 39/76/7341  . ETOH abuse 12/23/2016  . Macrocytic anemia 12/21/2016  . 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 DYSPHAGIA 03/25/2010    Past Surgical History:  Procedure Laterality Date  . ESOPHAGOGASTRODUODENOSCOPY N/A 12/25/2016   Procedure: ESOPHAGOGASTRODUODENOSCOPY (EGD);  Surgeon: Jerene Bears, MD;  Location: [facility];  Service: Endoscopy;  Laterality: N/A;  . IR PARACENTESIS  02/02/2017  . IR TRANSCATHETER BX  02/02/2017  . IR US GUIDE VASC ACCESS RIGHT  02/02/2017  . IR VENOGRAM HEPATIC W HEMODYNAMIC EVALUATION  02/02/2017    OB History    No data available       Home Medications    Prior to Admission medications   Medication Sig Start Date End Date Taking? Authorizing Provider  bacitracin ointment Apply 1 application topically 3 (three) times daily. Patient taking differently: Apply 1 application topically 3 (three) times daily as needed for wound care.  01/26/17  Yes Waynetta Pean, PA-C  camphor-menthol [facility]) lotion Apply topically as needed for itching. 02/05/17  Yes Mikhail, Velta Addison, DO  docusate sodium (COLACE) 100 MG capsule Take 1 capsule (100 mg total) by mouth 2 (two) times daily. 02/05/17  Yes Mikhail, Maryann, DO  enoxaparin (LOVENOX) 60 MG/0.6ML injection Inject 0.45 mLs (45 mg total) into the skin daily. 02/13/17  Yes Aline August, MD  feeding supplement, ENSURE ENLIVE, (ENSURE ENLIVE) LIQD Take 237 mLs by mouth 3 (three) times daily between meals. 12/28/16  Yes  Mikhail, [place], DO  folic acid (FOLVITE) 1 MG tablet Take 1 tablet (1 mg total) by mouth daily. 02/16/17  Yes Dara Hoyer, PA-C  lactulose (CHRONULAC) 10 GM/15ML solution Take 30 mLs (20 g total) by mouth 2 (two) times daily. 02/18/17  Yes Pyrtle, Lajuan Lines, MD  Multiple Vitamin (MULTIVITAMIN WITH MINERALS) TABS tablet Take 1 tablet by mouth daily. 12/29/16  Yes Mikhail, Velta Addison, DO  prednisoLONE (PRELONE) 15 MG/5ML SOLN Take 13.3 mLs (40 mg  total) by mouth daily before breakfast. Continue until tapered by GI. 02/05/17  Yes Mikhail, Velta Addison, DO  QUEtiapine (SEROQUEL) 25 MG tablet Take 1 tablet (25 mg total) by mouth at bedtime. May take 2 tablets if needed Patient taking differently: Take 25 mg by mouth at bedtime as needed (sleep). May take 2 tablets if needed 02/16/17 02/16/18 Yes Dara Hoyer, PA-C  spironolactone (ALDACTONE) 50 MG tablet Take 3 tablets (150 mg total) by mouth daily. 02/18/17  Yes Pyrtle, Lajuan Lines, MD  thiamine 100 MG tablet Take 1 tablet (100 mg total) by mouth daily. 12/29/16  Yes Mikhail, Maryann, DO  furosemide (LASIX) 20 MG tablet Take 3 tablets (60 mg total) by mouth daily. 02/18/17   Pyrtle, Lajuan Lines, MD  ondansetron (ZOFRAN) 4 MG tablet Take 1 tablet (4 mg total) by mouth every 8 (eight) hours as needed for nausea or vomiting. 02/13/17   Aline August, MD  pantoprazole (PROTONIX) 40 MG tablet Take 1 tablet (40 mg total) by mouth 2 (two) times daily. 02/18/17   Pyrtle, Lajuan Lines, MD  traZODone (DESYREL) 50 MG tablet Take 1/2 tablet HS to start Increase to 1-2 tablets HS as needed for sleep Patient not taking: Reported on 02/18/2017 01/26/17   Dara Hoyer, PA-C    Family History Family History  Problem Relation Age of Onset  . Hyperlipidemia Father   . Heart disease Father   . Alcoholism Father   . Heart attack Father   . Alcoholism Mother   . Alcoholism Sister     Social History Social History  Substance Use Topics  . Smoking status: Former Smoker    Types: Cigarettes  . Smokeless tobacco: Never Used     Comment: Reports she quit "about 6 months ago"  . Alcohol use Yes     Comment: Last drink: 36 days ago      Allergies   Patient has no known allergies.   Review of Systems Review of Systems  Constitutional: Negative for fever.  HENT: Negative for rhinorrhea and sore throat.   Eyes: Negative for redness.  Respiratory: Negative for cough.   Cardiovascular: Negative for chest pain.    Gastrointestinal: Positive for abdominal distention and nausea. Negative for abdominal pain, diarrhea and vomiting.       + abdominal pressure  Genitourinary: Negative for dysuria.  Musculoskeletal: Negative for myalgias.  Skin: Negative for rash.  Neurological: Negative for headaches.     Physical Exam Updated Vital Signs BP 125/84 (BP Location: Left Arm)   Pulse 98   Temp 97.7 F (36.5 C) (Oral)   Resp 15   SpO2 97%   Physical Exam  Constitutional: She appears well-developed and well-nourished.  HENT:  Head: Normocephalic and atraumatic.  Eyes: Right eye exhibits no discharge. Left eye exhibits no discharge.  Scleral icterus noted  Neck: Normal range of motion. Neck supple.  Cardiovascular: Normal rate, regular rhythm and normal heart sounds.   Pulmonary/Chest: Effort normal and breath sounds normal.  Abdominal: Soft. She exhibits  distension. There is no tenderness. There is no rebound and no guarding.  Protuberant abdomen with positive fluid wave, consistent with ascitic collection  Neurological: She is alert.  Skin: Skin is warm and dry.  Psychiatric: She has a normal mood and affect.  Nursing note and vitals reviewed.    ED Treatments / Results  Labs (all labs ordered are listed, but only abnormal results are displayed) Labs Reviewed  LIPASE, BLOOD - Abnormal; Notable for the following:       Result Value   Lipase 61 (*)    All other components within normal limits  COMPREHENSIVE METABOLIC PANEL - Abnormal; Notable for the following:    Chloride 96 (*)    Glucose, Bld 120 (*)    Calcium 8.6 (*)    Total Protein 6.2 (*)    Albumin 2.9 (*)    AST 88 (*)    ALT 85 (*)    Alkaline Phosphatase 558 (*)    Total Bilirubin 6.3 (*)    All other components within normal limits  CBC - Abnormal; Notable for the following:    WBC 37.2 (*)    RBC 2.92 (*)    Hemoglobin 10.5 (*)    HCT 30.7 (*)    MCV 105.1 (*)    MCH 36.0 (*)    RDW 17.3 (*)    All other  components within normal limits  URINALYSIS, ROUTINE W REFLEX MICROSCOPIC - Abnormal; Notable for the following:    Color, Urine AMBER (*)    Bilirubin Urine SMALL (*)    All other components within normal limits    Procedures Procedures (including critical care time)  Medications Ordered in ED Medications - No data to display   Initial Impression / Assessment and Plan / ED Course  I have reviewed the triage vital signs and the nursing notes.  Pertinent labs & imaging results that were available during my care of the patient were reviewed by me and considered in my medical decision making (see chart for details).     Patient seen and examined. Workup abnormal however unchanged from previous.  Vital signs reviewed and are as follows: BP 125/84 (BP Location: Left Arm)   Pulse 98   Temp 97.7 F (36.5 C) (Oral)   Resp 15   SpO2 97%   Spoke with radiology and eventually Dr. Anselm Pancoast regarding plan to obtain urgent paracentesis. It is Friday night and there is no way to schedule an outpatient procedure over the weekend. Patient will need to be admitted to the hospital for observation to have this done in the morning.  Patient and husband updated and agree with plan. Patient does not want to go home given the degree of discomfort and distention at this point.  Spoke with Dr. Roel Cluck who will admit to obs status to have therapeutic paracentesis.  Final Clinical Impressions(s) / ED Diagnoses   Final diagnoses:  Ascites due to alcoholic cirrhosis ([place])  Abdominal discomfort   Admit for therapeutic paracentesis.  New Prescriptions New Prescriptions   No medications on file     Carlisle Cater, Hershal Coria 02/18/17 2156    Fredia Sorrow, MD 02/20/17 [phone]

## 2017-02-18 NOTE — Patient Instructions (Addendum)
Your physician has requested that you go to the basement for the following lab work before leaving today: CBC, CMP, INR  Continue Lasix 60 mg daily  Continue Aldactone 150 mg daily.  Continue pantoprazole 40 mg twice daily.  Continue lactulose 30 ml twice daily.  Continue zofran as needed for nausea.  Continue prednisolone 10 ml daily.  Follow a low sodium diet (less than 2 grams or 2,000 mg daily)  Avoid all alcohol.  Go to [facility] today for paracentesis.  You have been scheduled for an appointment with Dr Hilarie Fredrickson on Monday, 05/02/17 @ 2:00 pm.  We have cancelled your colonoscopy for now that was originally scheduled for 03/24/17.  You have been scheduled for an appointment at the [facility] on 03/03/17 at 10:45 am. Address: [address] [place], [place] [address]  Phone [phone]

## 2017-02-18 NOTE — H&P (Signed)
Mercedes Mitchell CLE:[id number] DOB: 1960-12-15 DOA: 02/18/2017     PCP: Martinique, Betty G, MD   Outpatient Specialists: [facility] Patient coming from:  home Lives  With family    Chief Complaint: Abdominal   distention  HPI: Mercedes Mitchell is a 56 y.o. female with medical history significant of decompensated alcoholic liver disease, ascites. severe alcohol hepatitis, DVT, alcohol abuse in remission    Presented with severe abdominal distention she's had had a harder time tolerating Lovenox. Reports worsening leg edema bilaterally but worse in the right which is from no DVT. Patient had been having abdominal pain have had workup done for SBP during last admission which was negative started on empiric Rocephin. Patient on prednisolone for acute hepatitis on lactulose for hepatic encephalopathy her ascites has been treated with spironolactone and Lasix by GI that seemed to have progressed.  She states she is being worked up for liver transplant at [facility]. Reports that she has been sober for 57 days.  She reports being compliant with her medications she takes lactulose and have had 3-5 stools a day. She's been taking her Lasix and spironolactone denies blood in stools or melena.  Patient reports that she has an ulcer on her sacrum that has been painful.  Regarding pertinent Chronic problems: Patient has severe alcohol liver disease and severe alcoholic hepatitis last admission was from 10-16th of June 2018. Urinary admission patient have had thoracentesis done with removal 5.5 L of fluid at that point she was tested and was negative for SBP. Recently diagnosed with right lower extremity DVT she was started on Lovenox once a day for 3 months and discharge home on 24th of June 2018 A shin has long-standing history of leukocytosis likely related to steroid therapy and been worked up in the past and showed no evidence of infection  IN ER:  Temp (24hrs), Avg:97.7 F (36.5 C), Min:97.7 F  (36.5 C), Max:97.7 F (36.5 C)      on arrival  ED Triage Vitals  Enc Vitals Group     BP 02/18/17 1725 111/72     Pulse Rate 02/18/17 1725 70     Resp 02/18/17 1725 18     Temp 02/18/17 1725 97.7 F (36.5 C)     Temp Source 02/18/17 1725 Oral     SpO2 02/18/17 1725 98 %     Weight --      Height --      Head Circumference --      Peak Flow --      Pain Score 02/18/17 1724 6     Pain Loc --      Pain Edu? --      Excl. in GC? --   RR 15 97% Hr 98 BP 125/84 Lipase 61 Na 135 Cr 0.63 Alb 2.9 AST 88 ALT 85 Total bili 6.3 WBC 37.2 ( unchanged from baseline) Hg 10.5  Following Medications were ordered in ER: Medications - No data to display   ER provider discussed case with:  Interventional radiology recommends order for ultrasound guided paracentesis  Hospitalist was called for admission for symptomatic large volume ascites  Review of Systems:    Pertinent positives include: Abdominal distention fatigue, abdominal pain, Bilateral lower extremity swelling    Constitutional:  No weight loss, night sweats, Fevers, chills, weight loss  HEENT:  No headaches, Difficulty swallowing,Tooth/dental problems,Sore throat,  No sneezing, itching, ear ache, nasal congestion, post nasal drip,  Cardio-vascular:  No chest pain, Orthopnea,  PND, anasarca, dizziness, palpitations.no GI:  No heartburn, indigestion,  nausea, vomiting, diarrhea, change in bowel habits, loss of appetite, melena, blood in stool, hematemesis Resp:  no shortness of breath at rest. No dyspnea on exertion, No excess mucus, no productive cough, No non-productive cough, No coughing up of blood.No change in color of mucus.No wheezing. Skin:  no rash or lesions. No jaundice GU:  no dysuria, change in color of urine, no urgency or frequency. No straining to urinate.  No flank pain.  Musculoskeletal:  No joint pain or no joint swelling. No decreased range of motion. No back pain.  Psych:  No change in mood or  affect. No depression or anxiety. No memory loss.  Neuro: no localizing neurological complaints, no tingling, no weakness, no double vision, no gait abnormality, no slurred speech, no confusion  As per HPI otherwise 10 point review of systems negative.   Past Medical History: Past Medical History:  Diagnosis Date  . 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  paracentesis 2.6 liters 5/4: no SBP.    Marland Kitchen Alcoholism ([place]) 12/2016  . Candida esophagitis ([place])   . Cirrhosis ([place])   . DVT (deep venous thrombosis) ([place])   . Dysphagia 2011  . Esophagitis   . Hepatic steatosis   . Hiatal hernia   . Hiatal hernia   . Macrocytic anemia 12/2016  . Malnutrition ([place]) 12/2016  . Panic type anxiety neurosis 2011  . Portal hypertension ([place])   . Portal hypertensive gastropathy [facility])    Past Surgical History:  Procedure Laterality Date  . ESOPHAGOGASTRODUODENOSCOPY N/A 12/25/2016   Procedure: ESOPHAGOGASTRODUODENOSCOPY (EGD);  Surgeon: Jerene Bears, MD;  Location: [facility];  Service: Endoscopy;  Laterality: N/A;  . IR PARACENTESIS  02/02/2017  . IR TRANSCATHETER BX  02/02/2017  . IR US GUIDE VASC ACCESS RIGHT  02/02/2017  . IR VENOGRAM HEPATIC W HEMODYNAMIC EVALUATION  02/02/2017     Social History:  Ambulatory  walker       reports that she has quit smoking. Her smoking use included Cigarettes. She has never used smokeless tobacco. She reports that she drinks alcohol. She reports that she does not use drugs.  Allergies:  No Known Allergies     Family History:   Family History  Problem Relation Age of Onset  . Hyperlipidemia Father   . Heart disease Father   . Alcoholism Father   . Heart attack Father   . Alcoholism Mother   . Alcoholism Sister     Medications: Prior to Admission medications   Medication Sig Start Date End Date Taking? Authorizing Provider  bacitracin ointment Apply 1 application topically 3  (three) times daily. Patient taking differently: Apply 1 application topically 3 (three) times daily as needed for wound care.  01/26/17  Yes Waynetta Pean, PA-C  camphor-menthol [facility]) lotion Apply topically as needed for itching. 02/05/17  Yes Mikhail, Velta [place], DO  docusate sodium (COLACE) 100 MG capsule Take 1 capsule (100 mg total) by mouth 2 (two) times daily. 02/05/17  Yes Mikhail, Maryann, DO  enoxaparin (LOVENOX) 60 MG/0.6ML injection Inject 0.45 mLs (45 mg total) into the skin daily. 02/13/17  Yes Aline August, MD  feeding supplement, ENSURE ENLIVE, (ENSURE ENLIVE) LIQD Take 237 mLs by mouth 3 (three) times daily between meals. 12/28/16  Yes Mikhail, Velta [place], DO  folic acid (FOLVITE) 1 MG tablet Take 1 tablet (1 mg total) by mouth daily. 02/16/17  Yes  Dara Hoyer, PA-C  lactulose (CHRONULAC) 10 GM/15ML solution Take 30 mLs (20 g total) by mouth 2 (two) times daily. 02/18/17  Yes Pyrtle, Lajuan Lines, MD  Multiple Vitamin (MULTIVITAMIN WITH MINERALS) TABS tablet Take 1 tablet by mouth daily. 12/29/16  Yes Mikhail, Velta [place], DO  prednisoLONE (PRELONE) 15 MG/5ML SOLN Take 13.3 mLs (40 mg total) by mouth daily before breakfast. Continue until tapered by GI. 02/05/17  Yes Mikhail, Velta [place], DO  QUEtiapine (SEROQUEL) 25 MG tablet Take 1 tablet (25 mg total) by mouth at bedtime. May take 2 tablets if needed Patient taking differently: Take 25 mg by mouth at bedtime as needed (sleep). May take 2 tablets if needed 02/16/17 02/16/18 Yes Dara Hoyer, PA-C  spironolactone (ALDACTONE) 50 MG tablet Take 3 tablets (150 mg total) by mouth daily. 02/18/17  Yes Pyrtle, Lajuan Lines, MD  thiamine 100 MG tablet Take 1 tablet (100 mg total) by mouth daily. 12/29/16  Yes Mikhail, Maryann, DO  furosemide (LASIX) 20 MG tablet Take 3 tablets (60 mg total) by mouth daily. 02/18/17   Pyrtle, Lajuan Lines, MD  ondansetron (ZOFRAN) 4 MG tablet Take 1 tablet (4 mg total) by mouth every 8 (eight) hours as needed for nausea or vomiting. 02/13/17    Aline August, MD  pantoprazole (PROTONIX) 40 MG tablet Take 1 tablet (40 mg total) by mouth 2 (two) times daily. 02/18/17   Pyrtle, Lajuan Lines, MD  traZODone (DESYREL) 50 MG tablet Take 1/2 tablet HS to start Increase to 1-2 tablets HS as needed for sleep Patient not taking: Reported on 02/18/2017 01/26/17   Dara Hoyer, PA-C    Physical Exam: Patient Vitals for the past 24 hrs:  BP Temp Temp src Pulse Resp SpO2  02/18/17 1950 119/83 - - 83 14 96 %  02/18/17 1725 111/72 97.7 F (36.5 C) Oral 70 18 98 %    1. General:  in No Acute distress 2. Psychological: Alert and  Oriented 3. Head/ENT:   Moist  Mucous Membranes                          Head Non traumatic, neck supple                           Poor Dentition 4. SKIN: normal  Skin turgor,  Skin clean Dry small sacral decubitus ulcer noted 5. Heart: Regular rate and rhythm no  Murmur, Rub or gallop 6. Lungs:    no wheezes or crackles   7. Abdomen: Soft, mildly tender,  distended 8. Lower extremities: no clubbing, cyanosis, or edema 9. Neurologically Grossly intact, moving all 4 extremities equally. No asterixis 10. MSK: Normal range of motion   body mass index is unknown because there is no height or weight on file.  Labs on Admission:   Labs on Admission: I have personally reviewed following labs and imaging studies  CBC:  Recent Labs Lab 02/12/17 0501 02/13/17 0510 02/18/17 1701 02/18/17 1734  WBC 30.3* 32.5* 33.9 cH* 37.2*  NEUTROABS  --  28.9* 31.2*  --   HGB 9.4* 8.8* 10.1* 10.5*  HCT 27.0* 25.3* 30.2* 30.7*  MCV 105.5* 103.7* 108.8* 105.1*  PLT 146* 168 183.0 761   Basic Metabolic Panel:  Recent Labs Lab 02/12/17 0501 02/13/17 0510 02/18/17 1701 02/18/17 1734  NA 136 133* 135 135  K 4.3 3.9 4.2 4.8  CL 100* 99* 96 96*  CO2  29 27 31 30   GLUCOSE 84 88 141* 120*  BUN 18 25* 16 18  CREATININE 0.40* 0.40* 0.61 0.63  CALCIUM 8.5* 8.3* 8.6 8.6*  MG 1.9 1.8  --   --    GFR: Estimated Creatinine  Clearance: 62.2 mL/min (by C-G formula based on SCr of 0.63 mg/dL). Liver Function Tests:  Recent Labs Lab 02/12/17 0501 02/13/17 0510 02/18/17 1701 02/18/17 1734  AST 67* 62* 71* 88*  ALT 74* 67* 74* 85*  ALKPHOS 497* 462* 528* 558*  BILITOT 5.3* 4.9* 5.8* 6.3*  PROT 5.0* 4.7* 6.0 6.2*  ALBUMIN 2.3* 2.3* 3.1* 2.9*    Recent Labs Lab 02/18/17 1734  LIPASE 61*   No results for input(s): AMMONIA in the last 168 hours. Coagulation Profile:  Recent Labs Lab 02/18/17 1701  INR 1.7*   Cardiac Enzymes: No results for input(s): CKTOTAL, CKMB, CKMBINDEX, TROPONINI in the last 168 hours. BNP (last 3 results) No results for input(s): PROBNP in the last 8760 hours. HbA1C: No results for input(s): HGBA1C in the last 72 hours. CBG: No results for input(s): GLUCAP in the last 168 hours. Lipid Profile: No results for input(s): CHOL, HDL, LDLCALC, TRIG, CHOLHDL, LDLDIRECT in the last 72 hours. Thyroid Function Tests: No results for input(s): TSH, T4TOTAL, FREET4, T3FREE, THYROIDAB in the last 72 hours. Anemia Panel: No results for input(s): VITAMINB12, FOLATE, FERRITIN, TIBC, IRON, RETICCTPCT in the last 72 hours. Urine analysis:    Component Value Date/Time   COLORURINE AMBER (A) 02/18/2017 1947   APPEARANCEUR CLEAR 02/18/2017 1947   LABSPEC 1.012 02/18/2017 1947   PHURINE 7.0 02/18/2017 1947   GLUCOSEU NEGATIVE 02/18/2017 1947   HGBUR NEGATIVE 02/18/2017 1947   HGBUR large 04/13/2010 1228   [place] (A) 02/18/2017 [place] 02/18/2017 1947   PROTEINUR NEGATIVE 02/18/2017 1947   UROBILINOGEN 0.2 04/13/2010 1228   NITRITE NEGATIVE 02/18/2017 1947   LEUKOCYTESUR NEGATIVE 02/18/2017 1947   Sepsis Labs: @LABRCNTIP (procalcitonin:4,lacticidven:4) ) Recent Results (from the past 240 hour(s))  Blood culture (routine x 2)     Status: None   Collection Time: 02/10/17 12:02 AM  Result Value Ref Range Status   Specimen Description BLOOD LEFT FOREARM   Final   Special Requests   Final    BOTTLES DRAWN AEROBIC AND ANAEROBIC Blood Culture adequate volume   Culture   Final    NO GROWTH 5 DAYS Performed at [facility], [place] [address], [place], [place] [address]    Report Status 02/15/2017 FINAL  Final  Blood culture (routine x 2)     Status: None   Collection Time: 02/10/17 12:07 AM  Result Value Ref Range Status   Specimen Description BLOOD LEFT WRIST  Final   Special Requests IN PEDIATRIC BOTTLE Blood Culture adequate volume  Final   Culture   Final    NO GROWTH 5 DAYS Performed at [facility], [place] [address] [place], [place] [address]    Report Status 02/15/2017 FINAL  Final       UA   no evidence of UTI    No results found for: HGBA1C  Estimated Creatinine Clearance: 62.2 mL/min (by C-G formula based on SCr of 0.63 mg/dL).  BNP (last 3 results) No results for input(s): PROBNP in the last 8760 hours.   ECG REPORT Not obtained  There were no vitals filed for this visit.   Cultures:    Component Value Date/Time   SDES BLOOD LEFT WRIST 02/10/2017 0007  SPECREQUEST IN PEDIATRIC BOTTLE Blood Culture adequate volume 02/10/2017 0007   CULT  02/10/2017 0007    NO GROWTH 5 DAYS Performed at [facility], [place] [address], [place], [place] [address]    REPTSTATUS 02/15/2017 FINAL 02/10/2017 0007     Radiological Exams on Admission: No results found.  Chart has been reviewed    Assessment/Plan   56 y.o. female with medical history significant of decompensated alcoholic liver disease, ascites. severe alcohol hepatitis, DVT, alcohol abuse in remission Admitted for symptomatic large volume ascites  Present on Admission:  . Alcoholic cirrhosis of liver with ascites (HCC) continue lactulose, Lasix and Aldactone. Patient remains alcohol free. MELD score 20 with estimated 3 months mortality 19.6%   . Protein-calorie malnutrition, severe we'll check prealbumin and continue an sure obtain nutritional  consult . DVT (deep venous thrombosis) (HCC) - hold Lovenox while needs a procedure instead will transition to heparin for purposes of having a procedure done . Sacral decubitus ulcer, stage III ([place]) -  obtain wound care consult . Ascites - primary GI recommends paracentesis  up to 6 L with IV albumin. As per his recommendation we'll increase Lasix to 80 mg daily and increase Aldactone to 200 mg daily. Continue low-sodium diet . Abdominal pain -mild, Feels a pressure from distention have had recent evaluation for SBP which was negative Leukocytosis chronic - at this point no evidence of infectious etiology Acute hepatitis continue steroids Other plan as per orders.  DVT prophylaxis:  heparin  Code Status:  FULL CODE as per patient    Family Communication:   Family not at  Bedside    Disposition Plan:    To home once workup is complete and patient is stable                       Would benefit from PT/OT eval prior to DC   ordered                                            Consults called: none Admission status:   obs   Level of care       medical floor            I have spent a total of 56 min on this admission  Mercedes Mitchell 02/18/2017, 11:13 PM    [facility]  Pager [phone]   after 2 AM please page floor coverage PA If 7AM-7PM, please contact the day team taking care of the patient  [email or web address]  Password [email or web address]

## 2017-02-18 NOTE — ED Triage Notes (Signed)
With triage pt verbalizes bloated with associated nausea; here for paracentesis.

## 2017-02-18 NOTE — ED Notes (Signed)
Pt being sent by [facility] d/t ascites r/t alcoholic cirrhosis.  Dr. Harriett Sine reports Pt needs a paracentesis and he was unable to schedule an Outpatient appointment.  He sts Pt "can have 6L removed and 50G Albumin."  Does not feel she needs admission.

## 2017-02-18 NOTE — Progress Notes (Signed)
ANTICOAGULATION CONSULT NOTE - Initial Consult  Pharmacy Consult for IV heparin (on PTA Lovenox) Indication: DVT  No Known Allergies  Patient Measurements:   Heparin Dosing Weight: 49 kg  Vital Signs: Temp: 97.7 F (36.5 C) (06/29 1725) Temp Source: Oral (06/29 1725) BP: 116/81 (06/29 2300) Pulse Rate: 95 (06/29 2300)  Labs:  Recent Labs  02/18/17 1701 02/18/17 1734  HGB 10.1* 10.5*  HCT 30.2* 30.7*  PLT 183.0 192  LABPROT 17.8*  --   INR 1.7*  --   CREATININE 0.61 0.63    Estimated Creatinine Clearance: 62.2 mL/min (by C-G formula based on SCr of 0.63 mg/dL).   Medical History: Past Medical History:  Diagnosis Date  . 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  paracentesis 2.6 liters 5/4: no SBP.    Marland Kitchen Alcoholism ([place]) 12/2016  . Candida esophagitis ([place])   . Cirrhosis ([place])   . DVT (deep venous thrombosis) ([place])   . Dysphagia 2011  . Esophagitis   . Hepatic steatosis   . Hiatal hernia   . Hiatal hernia   . Macrocytic anemia 12/2016  . Malnutrition ([place]) 12/2016  . Panic type anxiety neurosis 2011  . Portal hypertension ([place])   . Portal hypertensive gastropathy (HCC)     Medications:  Scheduled:  Infusions:   Assessment: 70 yoF with hx of alcoholic liver disease c/o abdominal distention on chronic Lovenox for hx of DVT.  MD changing to IV heparin pending possible thoracentesis.  No bolus per MD  Goal of Therapy:  Heparin level 0.3-0.7 units/ml Monitor platelets by anticoagulation protocol: Yes   Plan:  Baseline aPtt Will start heparin drip ~ 12 hours after last dose of Lovenox Start heparin drip at 800 units/hr (No bolus) Daily CBC/HL Check 1st HL in 6 hours  Dorrene German 02/18/2017,11:24 PM

## 2017-02-18 NOTE — Progress Notes (Signed)
Subjective:    Patient ID: Mercedes Mitchell, female    DOB: Jan 04, 1961, 56 y.o.   MRN: [id number]  HPI Mercedes Mitchell is a 56 year old female with past medical history of acute alcoholic hepatitis, severely decompensated alcoholic cirrhosis, complicated by ascites, encephalopathy and portal hypertensive gastropathy, recent DVT in the right lower extremity on Lovenox therapy who is here for follow-up. She is here today with her husband. She was recently admitted with acute lower extremity DVT and placed on Lovenox therapy. She also has been seen by [facility] liver care, [name], NP.  She reports that she has been having issues most recently with increasing abdominal distention. This is very uncomfortable to her. She reports it's hard to take the Lovenox subcutaneous with her belly becoming tighter. She is also having bilateral lower extremity edema though certainly worse on the right with her recent DVT. She is having 3-5 stools per day taking lactulose 20 g twice a day. She has continued Lasix 60 mg daily and Aldactone 150 mg daily. She is doing Protonix 40 mg twice daily and occasionally using Zofran for nausea. She has used trazodone with no benefit for sleep and has recently been placed on Seroquel 50 mg at bedtime. This is helps him with sleep and she is still not sleeping well. She denies bleeding including blood in his stool and melena. Her husband reports no severe confusion though she has had intermittent issues with agitation but he thinks this is more related to discomfort. She has been alcohol free for 57 days. She is going to alcohol counseling as an outpatient and has attended [employer].  She reports a new issue which is a ulcer on her tailbone. This is quite painful. She tries to avoid lying supine but feels that this is secondary to spending a lot of time in bed. She is using a rolling walker to assist with walking.  I reviewed her liver biopsy which showed acute hepatitis with a background  of cirrhosis. She had canalicular cholestasis and bile duct infarcts with secondary iron overload.  She also has a history of a CT scan with a hypodensity in the pancreatic head recommended attention on follow-up. She had a mild elevation in CA-19-9 of 60.  She has had ongoing issues with leukocytosis without definitive evidence for infection. This was felt in part related to steroid therapy. She was seen by hematology during a recent inpatient admission and this was not felt secondary to malignancy. She's had no episodes of sepsis or SBP.  She has continued prednisolone at 10 mL daily which is 30 mg daily  Review of Systems As per history of present illness, otherwise negative  Current Medications, Allergies, Past Medical History, Past Surgical History, Family History and Social History were reviewed in [employer] record.     Objective:   Physical Exam BP 100/70 (BP Location: Left Arm, Patient Position: Sitting, Cuff Size: Normal)   Pulse (!) 112   Ht 5\' 6"  (1.676 m) Comment: 6  Wt 108 lb (49 kg)   BMI 17.43 kg/m  Constitutional: Chronically ill-appearing female, no acute distress, cachectic appearing HEENT: Normocephalic and atraumatic. Oropharynx is clear and moist. Conjunctivae are normal. Mild scleral icterus Neck: Neck supple. Trachea midline. Cardiovascular: Tachycardic but regular with soft systolic ejection murmur Pulmonary/chest: Effort normal and breath sounds decreased in bilateral bases Abdominal: Moderate ascites with distention, nontenderBowel sounds active throughout.  Extremities: no clubbing, cyanosis, 2+ pitting edema in the right lower extremity, trace to  1+ in the left lower extremity Neurological: Alert and oriented to person place and time. No asterixis but fine tremor Skin: Skin is warm and dry.  Circular proximally stage II sacral decubitus ulcer, see photo Psychiatric: Mood is somewhat depressed with flat affect      CBC      Component Value Date/Time   WBC 37.2 (H) 02/18/2017 1734   RBC 2.92 (L) 02/18/2017 1734   HGB 10.5 (L) 02/18/2017 1734   HCT 30.7 (L) 02/18/2017 1734   HCT 23.3 (L) 12/25/2016 0654   PLT 192 02/18/2017 1734   MCV 105.1 (H) 02/18/2017 1734   MCH 36.0 (H) 02/18/2017 1734   MCHC 34.2 02/18/2017 1734   RDW 17.3 (H) 02/18/2017 1734   LYMPHSABS 1.6 02/18/2017 1701   MONOABS 1.0 02/18/2017 1701   EOSABS 0.2 02/18/2017 1701   BASOSABS 0.0 02/18/2017 1701   CMP     Component Value Date/Time   NA 135 02/18/2017 1734   K 4.8 02/18/2017 1734   CL 96 (L) 02/18/2017 1734   CO2 30 02/18/2017 1734   GLUCOSE 120 (H) 02/18/2017 1734   BUN 18 02/18/2017 1734   CREATININE 0.63 02/18/2017 1734   CALCIUM 8.6 (L) 02/18/2017 1734   PROT 6.2 (L) 02/18/2017 1734   ALBUMIN 2.9 (L) 02/18/2017 1734   AST 88 (H) 02/18/2017 1734   ALT 85 (H) 02/18/2017 1734   ALKPHOS 558 (H) 02/18/2017 1734   BILITOT 6.3 (H) 02/18/2017 1734   GFRNONAA >60 02/18/2017 1734   GFRAA >60 02/18/2017 1734   Lab Results  Component Value Date   INR 1.7 (H) 02/18/2017   INR 1.54 02/09/2017   INR 1.72 02/04/2017       Assessment & Plan:  56 year old female with past medical history of acute alcoholic hepatitis, severely decompensated alcoholic cirrhosis, complicated by ascites, encephalopathy and portal hypertensive gastropathy, recent DVT in the right lower extremity on Lovenox therapy who is here for follow-up.   1. Decompensated alcoholic cirrhosis -- fortunately she has committed to being alcohol free and has been sober now nearly 60 days. She is going to alcohol prevention and relapse counseling as well as AA meetings. She continues to have significant decompensation with ascites along with her history of encephalopathy and portal gastropathy.  Ascites -- more tense today. In need of paracentesis. Paracentesis not available as an outpatient at least for the next 4 days. Patient will go to the ER this evening with plans  for paracentesis up to 6 L with IV albumin. Plan to increase Lasix to 80 mg daily and increase Aldactone to 200 mg daily. Repeat basic metabolic panel 7 days after increasing diuretics. Continue low-sodium diet  Alcoholism -- continue alcohol abuse counseling and rehabilitation and relapse prevention. She has family support here also  Hepatic encephalopathy -- under adequate control now. Continue lactulose 20 g twice a day. Rifaximin can be added if necessary  Leukocytosis -- may need to [facility] hematology. No evidence of sepsis or infection at present. Will check cell count when paracentesis performed today to exclude SBP.  Transplant evaluation -- she will see Dr. Zollie Scale on 03/02/2017 in [place] for hepatology follow-up and to consider transplant evaluation. She understands she would need at least 6 months of sobriety before transplant could be possible  Abnormal GI imaging/pancreatic head fullness/elevated CA-19-9 -- will need repeat CT scan of the abdomen and pelvis with IV contrast to examine the pancreas in 3-6 months  Portal gastropathy -- no evidence of varices. Continue  twice a day PPI  Alcoholic hepatitis -- continue current dose of prednisolone 30 mg daily. Will leave decision to taper to hepatology, Dr. Zollie Scale  2. Right lower extremity DVT -- continue Lovenox therapy.  3. Severe malnutrition -- secondary to decompensated liver disease. Continue high protein diet with boost or ensure  4. Sacral decubitus ulceration -- photo taken today. See above. Wound clinic referral. I asked that she avoid lying flat on this ulcer if at all possible. When she is lying down I encouraged her to roll frequently from side to side to change pressure points.  50 minutes spent with the patient today. Greater than 50% was spent in counseling and coordination of care with the patient

## 2017-02-18 NOTE — Progress Notes (Signed)
    Daily Group Progress Note  Program: CD-IOP   02/18/2017 Mercedes Mitchell [id number]  Diagnosis:  Alcohol use disorder, severe, dependence ([place])  Cannabis abuse, episodic use   Sobriety Date: 6/3  Group Time: 1-2:30  Participation Level: Active  Behavioral Response: Appropriate and Sharing  Type of Therapy: Process Group  Interventions: CBT and Solution Focused  Topic: Patients were active and engaged in group check in, discussing their recovery and coping skills they are using to avoid relapse. One pt was absent from group due to another medical appointment. UDS were collected from some pts. Some pts met with [name] to discuss medications management. One member completed successfully and her husband was present for final 15 min of group.     Group Time: 2:30-4  Participation Level: Active  Behavioral Response: Appropriate and Sharing  Type of Therapy: Psycho-education Group  Interventions: CBT  Topic: Patients were active and engaged in group psychoed. Counselors led pts through a brief explanation of Erikson's child development model. Pts were asked questions about their childhood homes, lack of emotional support, and how their nurture impacted their proneness to addiction. Patients spoke openly to each other.   Summary: Patient was active and engaged in session. She presented as prone to tearfulness during session. She reported her husband got frustrated at her last night for not being able to do things for herself. She reported on her diet; she is cooking low sodium foods such as chicken breast and avoiding breads. Pt stated her PT is going well and she is feeling hopeful about her situation, despite having a serious condition. She states she was informed that on July 11 she will go to a doctor in [place] who will update her about the likelihood and timeframe of her liver transplant. Pt did not attend any AA meetings and states she plans to attend them  when she feels better physically.   UDS collected: No Results: pending  AA/NA attended?: No  Sponsor?: No   [name], LPCA, LCASA 02/18/2017 8:22 AM

## 2017-02-18 NOTE — ED Notes (Signed)
Pt had drawn in triage Lavender Gold  dk green Lt green 

## 2017-02-19 ENCOUNTER — Observation Stay (HOSPITAL_COMMUNITY): Payer: Managed Care, Other (non HMO)

## 2017-02-19 DIAGNOSIS — L89153 Pressure ulcer of sacral region, stage 3: Secondary | ICD-10-CM | POA: Diagnosis not present

## 2017-02-19 DIAGNOSIS — K7031 Alcoholic cirrhosis of liver with ascites: Secondary | ICD-10-CM | POA: Diagnosis not present

## 2017-02-19 LAB — CBC
HCT: 25.1 % — ABNORMAL LOW (ref 36.0–46.0)
HEMOGLOBIN: 8.8 g/dL — AB (ref 12.0–15.0)
MCH: 36.8 pg — AB (ref 26.0–34.0)
MCHC: 35.1 g/dL (ref 30.0–36.0)
MCV: 105 fL — AB (ref 78.0–100.0)
PLATELETS: 162 10*3/uL (ref 150–400)
RBC: 2.39 MIL/uL — AB (ref 3.87–5.11)
RDW: 17.2 % — ABNORMAL HIGH (ref 11.5–15.5)
WBC: 33.4 10*3/uL — ABNORMAL HIGH (ref 4.0–10.5)

## 2017-02-19 LAB — GRAM STAIN

## 2017-02-19 LAB — PROTIME-INR
INR: 1.53
Prothrombin Time: 18.5 seconds — ABNORMAL HIGH (ref 11.4–15.2)

## 2017-02-19 LAB — LACTATE DEHYDROGENASE, PLEURAL OR PERITONEAL FLUID: LD FL: 45 U/L — AB (ref 3–23)

## 2017-02-19 LAB — COMPREHENSIVE METABOLIC PANEL
ALBUMIN: 2.3 g/dL — AB (ref 3.5–5.0)
ALT: 66 U/L — ABNORMAL HIGH (ref 14–54)
AST: 68 U/L — ABNORMAL HIGH (ref 15–41)
Alkaline Phosphatase: 417 U/L — ABNORMAL HIGH (ref 38–126)
Anion gap: 10 (ref 5–15)
BUN: 17 mg/dL (ref 6–20)
CHLORIDE: 98 mmol/L — AB (ref 101–111)
CO2: 27 mmol/L (ref 22–32)
CREATININE: 0.47 mg/dL (ref 0.44–1.00)
Calcium: 8.2 mg/dL — ABNORMAL LOW (ref 8.9–10.3)
GFR calc non Af Amer: >60 mL/min (ref >60)
Glucose, Bld: 84 mg/dL (ref 65–99)
Potassium: 3.9 mmol/L (ref 3.5–5.1)
SODIUM: 135 mmol/L (ref 135–145)
Total Bilirubin: 5.5 mg/dL — ABNORMAL HIGH (ref 0.3–1.2)
Total Protein: 4.9 g/dL — ABNORMAL LOW (ref 6.5–8.1)

## 2017-02-19 LAB — BODY FLUID CELL COUNT WITH DIFFERENTIAL
Eos, Fluid: 0 %
Lymphs, Fluid: 20 %
Monocyte-Macrophage-Serous Fluid: 29 % — ABNORMAL LOW (ref 50–90)
Neutrophil Count, Fluid: 51 % — ABNORMAL HIGH (ref 0–25)
Total Nucleated Cell Count, Fluid: 101 cu mm (ref 0–1000)

## 2017-02-19 LAB — TSH: TSH: 1.446 u[IU]/mL (ref 0.350–4.500)

## 2017-02-19 LAB — HEPARIN LEVEL (UNFRACTIONATED): Heparin Unfractionated: <0.1 IU/mL — ABNORMAL LOW (ref 0.30–0.70)

## 2017-02-19 LAB — PREALBUMIN: Prealbumin: 11.7 mg/dL — ABNORMAL LOW (ref 18–38)

## 2017-02-19 LAB — MAGNESIUM: Magnesium: 2 mg/dL (ref 1.7–2.4)

## 2017-02-19 LAB — ALBUMIN, PLEURAL OR PERITONEAL FLUID

## 2017-02-19 LAB — APTT: APTT: 36 s (ref 24–36)

## 2017-02-19 LAB — PHOSPHORUS: Phosphorus: 4.3 mg/dL (ref 2.5–4.6)

## 2017-02-19 MED ORDER — COLLAGENASE 250 UNIT/GM EX OINT
TOPICAL_OINTMENT | Freq: Every day | CUTANEOUS | Status: DC
  Filled 2017-02-19: qty 90

## 2017-02-19 MED ORDER — HYDROCERIN EX CREA
TOPICAL_CREAM | Freq: Every day | CUTANEOUS | Status: DC
  Filled 2017-02-19: qty 113

## 2017-02-19 MED ORDER — ALBUMIN HUMAN 25 % IV SOLN
25.0000 g | Freq: Once | INTRAVENOUS | Status: AC
  Administered 2017-02-19: 25 g via INTRAVENOUS
  Filled 2017-02-19 (×2): qty 100

## 2017-02-19 MED ORDER — FUROSEMIDE 20 MG PO TABS: 80.0000 mg | ORAL_TABLET | Freq: Every day | ORAL | Status: DC

## 2017-02-19 MED ORDER — COLLAGENASE 250 UNIT/GM EX OINT: TOPICAL_OINTMENT | Freq: Every day | CUTANEOUS | 0 refills | Status: DC

## 2017-02-19 MED ORDER — HYDROCERIN EX CREA: 1.0000 "application " | TOPICAL_CREAM | Freq: Every day | CUTANEOUS | 0 refills | Status: DC

## 2017-02-19 MED ORDER — SPIRONOLACTONE 50 MG PO TABS
200.0000 mg | ORAL_TABLET | Freq: Every day | ORAL | Status: DC
Start: 2017-02-19 — End: 2017-04-11

## 2017-02-19 MED ORDER — HEPARIN (PORCINE) IN NACL 100-0.45 UNIT/ML-% IJ SOLN
800.0000 [IU]/h | INTRAMUSCULAR | Status: DC
  Administered 2017-02-19 (×2): 800 [IU]/h via INTRAVENOUS
  Filled 2017-02-19: qty 250

## 2017-02-19 MED ORDER — BACITRACIN ZINC 500 UNIT/GM EX OINT: 1.0000 "application " | TOPICAL_OINTMENT | Freq: Three times a day (TID) | CUTANEOUS | Status: DC | PRN

## 2017-02-19 NOTE — Procedures (Signed)
Ultrasound-guided diagnostic and therapeutic paracentesis performed yielding 4.2 liters of serous colored fluid. No immediate complications.  Mercedes Mitchell E 1:52 PM 02/19/2017

## 2017-02-19 NOTE — Discharge Summary (Signed)
Physician Discharge Summary  Mercedes Mitchell QIH:[id number] DOB: Apr 16, 1961  PCP: Martinique, Betty G, MD  Admit date: 02/18/2017 Discharge date: 02/19/2017  Recommendations for Outpatient Follow-up:  1. Dr. Zenovia Jarred, [place] Course one week with repeat labs (CBC & CMP). 2. Dr. Berry Martinique, PCP.  Home Health: None Equipment/Devices: None    Discharge Condition: Improved and stable  CODE STATUS: Full  Diet recommendation: Heart healthy diet.  Wound care instructions: As per recommendations by wound care nurse, as follows,  Dressing procedure/placement/frequency: We will begin today the once daily applicatoin of collagenase (Santyl) to the ulcer topped with saline moistened gauze, topped with dry gauze and covered with a silicone foam dressing to dissolve the slough and reveal the red, moist wound bed desired for healing. Husband reports Mercedes Mitchell sits on a chair pad at home, but has been in bed mostly lately.  She prefers the supine position over side lying positioning.  We have also provided Prevalon pressure redistribution heel boots as she has edema in the RLE and her heels are cracked and dry. Eucerin cream is to be applied daily.  Discharge Diagnoses:  Active Problems:   Protein-calorie malnutrition, severe   Ascites   Alcoholic cirrhosis of liver with ascites (HCC)   Abdominal pain   DVT (deep venous thrombosis) (HCC)   Sacral decubitus ulcer, stage III [facility])   Brief Summary: 56 year old married female with PMH of acute alcoholic hepatitis, severely decompensated alcoholic cirrhosis, complicated by ascites, encephalopathy and portal hypertensive gastropathy, recent DVT in the right lower extremity on Lovenox therapy, seen by Dr. Hilarie Fredrickson in outpatient GI follow-up on 02/18/17. She reported increasing abdominal distention which was very uncomfortable to her, hard to take the subcutaneous Lovenox due to her belly becoming tighter, bilateral lower extremity edema, worse on the right where  she had a recent DVT. She is compliant with her lactulose and having appropriate BMs, compliant with diuretics. No bleeding issues reported. Intermittent issues with agitation but no confusion. She has been alcohol free for 57 days on day of admission. She also reported painful ulcer on her tailbone. As per his evaluation, she needed paracentesis which was not available as an outpatient for at least the next 4 days. Hence Mercedes Mitchell was sent to [facility] for paracentesis.  Assessment and plan:  1. Decompensated alcoholic cirrhosis, complicated by recurrent ascites, encephalopathy, portal hypertensive gastropathy: Interventional radiology was consulted and 4.2 L of ascitic fluid was removed. Clinical picture and ascitic fluid results not indicative of SBP. She did receive IV albumin post paracentesis. As per GI recommendations, Lasix increased to 80 mg daily and Aldactone increased to 200 MG daily. Follow up with GI in one week with repeat labs. Hepatic encephalopathy is under adequate control on current dose of lactulose. Currently not on rifaximin. Mercedes Mitchell is due to see Dr. Zollie Scale on 03/02/17 in [place] for hepatology follow-up and to consider transplant evaluation. Mercedes Mitchell is aware that she would need at least 6 months of sobriety before transplant could be possible. Portal gastropathy without evidence of varices and Mercedes Mitchell is to continue twice-daily PPI. She remains on prednisolone 40 mg daily for alcoholic hepatitis. Liver biopsy showed acute hepatitis with a background of cirrhosis. 2. Alcoholism: Has been sober for 57 days as of day of admission. Mercedes Mitchell states that she and her spouse are abstaining from alcohol together. Congratulated them on this. She is going to alcohol prevention and relapse counseling as well as AA meetings. 3. Leukocytosis: No clinical evidence of sepsis or infection  at present. This is felt in part related to steroid therapy. She was seen by hematology during a recent  inpatient admission and this was not felt secondary to malignancy. May consider outpatient hematology consultation/follow-up. 4. Abnormal GI imaging/pancreatic head fullness/elevated CA 19-9: Outpatient follow-up with GI and will need repeat CT scan of the abdomen and pelvis with IV contrast to examine the pancreas and 3-6 months. 5. Right lower extremity DVT: Discussed with pharmacist: Mercedes Mitchell is on lower than usual therapeutic dose of Lovenox for her weight and renal function. Suspect this is due to coagulopathy from her decompensated cirrhosis. INR has been mostly in the 1.5-1.7 range. We'll defer dosing adjustment to her PCP at outpatient follow-up. She was briefly on IV heparin for procedures. Continue prior home dose of Lovenox. 6. Severe protein calorie malnutrition: Secondary to decompensated alcoholic cirrhosis. Continue high protein diet. 7. Sacral decubitus ulceration: Evaluated and recommendations made by wound care nursing. 8. Macrocytic anemia: Stable. Periodically follow CBCs as outpatient.   Consultations:  Interventional radiology  Procedures:  Ultrasound-guided diagnostic and therapeutic paracentesis performed using 4.2 L of serous colored fluid.   Discharge Instructions  Discharge Instructions    Call MD for:    Complete by:  As directed    Recurrent abdominal distention or pain.   Call MD for:  difficulty breathing, headache or visual disturbances    Complete by:  As directed    Call MD for:  extreme fatigue    Complete by:  As directed    Call MD for:  persistant dizziness or light-headedness    Complete by:  As directed    Call MD for:  persistant nausea and vomiting    Complete by:  As directed    Call MD for:  severe uncontrolled pain    Complete by:  As directed    Call MD for:  temperature >100.4    Complete by:  As directed    Diet - low sodium heart healthy    Complete by:  As directed    Discharge wound care:    Complete by:  As directed    As per  instructions provided to you in the hospital by the wound care nurse, as follows:  Dressing procedure/placement/frequency: once daily applicatoin of collagenase (Santyl) to the ulcer topped with saline moistened gauze, topped with dry gauze and covered with a silicone foam dressing to dissolve the slough and reveal the red, moist wound bed desired for healing. Also provided Prevalon pressure redistribution heel boots as she has edema in the RLE and her heels are cracked and dry. Eucerin cream is to be applied daily.   Increase activity slowly    Complete by:  As directed        Medication List    STOP taking these medications   traZODone 50 MG tablet Commonly known as:  DESYREL     TAKE these medications   bacitracin ointment Apply 1 application topically 3 (three) times daily as needed for wound care.   camphor-menthol lotion Commonly known as:  SARNA Apply topically as needed for itching.   collagenase ointment Commonly known as:  SANTYL Apply topically daily. Apply to Unstageable pressure injury to coccyx once daily   docusate sodium 100 MG capsule Commonly known as:  COLACE Take 1 capsule (100 mg total) by mouth 2 (two) times daily.   enoxaparin 60 MG/0.6ML injection Commonly known as:  LOVENOX Inject 0.45 mLs (45 mg total) into the skin daily.   feeding supplement (  ENSURE ENLIVE) Liqd Take 237 mLs by mouth 3 (three) times daily between meals.   folic acid 1 MG tablet Commonly known as:  FOLVITE Take 1 tablet (1 mg total) by mouth daily.   furosemide 20 MG tablet Commonly known as:  LASIX Take 4 tablets (80 mg total) by mouth daily. What changed:  how much to take   hydrocerin Crea Apply 1 application topically daily. Apply to bilateral legs once daily   lactulose 10 GM/15ML solution Commonly known as:  CHRONULAC Take 30 mLs (20 g total) by mouth 2 (two) times daily.   multivitamin with minerals Tabs tablet Take 1 tablet by mouth daily.   ondansetron 4 MG  tablet Commonly known as:  ZOFRAN Take 1 tablet (4 mg total) by mouth every 8 (eight) hours as needed for nausea or vomiting.   pantoprazole 40 MG tablet Commonly known as:  PROTONIX Take 1 tablet (40 mg total) by mouth 2 (two) times daily.   prednisoLONE 15 MG/5ML Soln Commonly known as:  PRELONE Take 13.3 mLs (40 mg total) by mouth daily before breakfast. Continue until tapered by GI.   QUEtiapine 25 MG tablet Commonly known as:  SEROQUEL Take 1 tablet (25 mg total) by mouth at bedtime. May take 2 tablets if needed What changed:  when to take this  reasons to take this  additional instructions   spironolactone 50 MG tablet Commonly known as:  ALDACTONE Take 4 tablets (200 mg total) by mouth daily. What changed:  how much to take   thiamine 100 MG tablet Take 1 tablet (100 mg total) by mouth daily.      Follow-up Information    Martinique, Betty G, MD Follow up in 3 day(s).   Specialty:  Family Medicine Contact information: [place] [address] [phone]        Jerene Bears, MD. Schedule an appointment as soon as possible for a visit in 1 week(s).   Specialty:  Gastroenterology Why:  To be seen with repeat labs (CBC & CMP). Contact information: [address] [place] [address] [phone]          No Known Allergies    Procedures/Studies:  US Paracentesis  Result Date: 02/19/2017 INDICATION: History of alcoholic cirrhosis with recurrent abdominal ascites. Request is made for diagnostic and therapeutic paracentesis. EXAM: ULTRASOUND GUIDED DIAGNOSTIC AND THERAPEUTIC PARACENTESIS MEDICATIONS: 1% lidocaine COMPLICATIONS: None immediate. PROCEDURE: Informed written consent was obtained from the Mercedes Mitchell after a discussion of the risks, benefits and alternatives to treatment. A timeout was performed prior to the initiation of the procedure. Initial ultrasound scanning demonstrates a large amount of ascites within the right  lower abdominal quadrant. The right lower abdomen was prepped and draped in the usual sterile fashion. 1% lidocaine was used for local anesthesia. Following this, a 19 gauge, 7-cm, Yueh catheter was introduced. An ultrasound image was saved for documentation purposes. The paracentesis was performed. The catheter was removed and a dressing was applied. The Mercedes Mitchell tolerated the procedure well without immediate post procedural complication. FINDINGS: A total of approximately 4.2 L of serous fluid was removed. Samples were sent to the laboratory as requested by the clinical team. IMPRESSION: Successful ultrasound-guided paracentesis yielding 4.2 liters of peritoneal fluid. Read by: Saverio Danker, PA-C Electronically Signed   By: Markus Daft M.D.   On: 02/19/2017 13:54    Subjective: Abdominal distention without pain. No fever or chills. No nausea or vomiting. Having 3-5 BMs daily as expected on  lactulose. Bruising of upper and lower extremities. As per spouse, lower extremity bruising from SCDs during prior hospital admissions. Denies trauma or falls. No confusion reported. No bleeding reported. No dyspnea, chest pain. No dizziness or lightheadedness.  Discharge Exam:  Vitals:   02/19/17 1320 02/19/17 1350 02/19/17 1355 02/19/17 1603  BP: 117/77 109/77 106/69 113/74  Pulse:   97 99  Resp:   16   Temp:   98.6 F (37 C)   TempSrc:   Oral   SpO2:   98%   Weight:      Height:        General: Pleasant middle-aged female, small built, frail, chronically ill looking, lying comfortably supine in bed. Cardiovascular: S1 & S2 heard, RRR, S1/S2 +. No murmurs, rubs, gallops or clicks. No JVD. Respiratory: Clear to auscultation without wheezing, rhonchi or crackles. No increased work of breathing. Abdominal:  Abdomen moderately distended (prior to paracentesis), not tense, nontender, soft, ascites + +. No organomegaly or masses appreciated. Normal bowel sounds heard. CNS: Alert and oriented. No focal  deficits. Extremities: no edema, no cyanosis. Extensive bruising of all extremities. Trace bilateral leg edema, right >left. No other acute findings. Peripheral pulses symmetrically well felt.    The results of significant diagnostics from this hospitalization (including imaging, microbiology, ancillary and laboratory) are listed below for reference.     Microbiology: Recent Results (from the past 240 hour(s))  Blood culture (routine x 2)     Status: None   Collection Time: 02/10/17 12:02 AM  Result Value Ref Range Status   Specimen Description BLOOD LEFT FOREARM  Final   Special Requests   Final    BOTTLES DRAWN AEROBIC AND ANAEROBIC Blood Culture adequate volume   Culture   Final    NO GROWTH 5 DAYS Performed at [facility], [address] [place], [place] [address]    Report Status 02/15/2017 FINAL  Final  Blood culture (routine x 2)     Status: None   Collection Time: 02/10/17 12:07 AM  Result Value Ref Range Status   Specimen Description BLOOD LEFT WRIST  Final   Special Requests IN PEDIATRIC BOTTLE Blood Culture adequate volume  Final   Culture   Final    NO GROWTH 5 DAYS Performed at [facility], [place] [address] [place], [place] [address]    Report Status 02/15/2017 FINAL  Final  Gram stain     Status: None   Collection Time: 02/19/17  1:32 PM  Result Value Ref Range Status   Specimen Description PERITONEAL  Final   Special Requests NONE  Final   Gram Stain   Final    FEW WBC PRESENT,BOTH PMN AND MONONUCLEAR NO ORGANISMS SEEN Performed at [facility], [place] [address] [place], [place] [address]    Report Status 02/19/2017 FINAL  Final     Labs: CBC:  Recent Labs Lab 02/13/17 0510 02/18/17 1701 02/18/17 1734 02/19/17 0450  WBC 32.5* 33.9 cH* 37.2* 33.4*  NEUTROABS 28.9* 31.2*  --   --   HGB 8.8* 10.1* 10.5* 8.8*  HCT 25.3* 30.2* 30.7* 25.1*  MCV 103.7* 108.8* 105.1* 105.0*  PLT 168 183.0 192 268   Basic Metabolic Panel:  Recent  Labs Lab 02/13/17 0510 02/18/17 1701 02/18/17 1734 02/19/17 0450  NA 133* 135 135 135  K 3.9 4.2 4.8 3.9  CL 99* 96 96* 98*  CO2 27 31 30 27   GLUCOSE 88 141* 120* 84  BUN 25*  16 18 17   CREATININE 0.40* 0.61 0.63 0.47  CALCIUM 8.3* 8.6 8.6* 8.2*  MG 1.8  --   --  2.0  PHOS  --   --   --  4.3   Liver Function Tests:  Recent Labs Lab 02/13/17 0510 02/18/17 1701 02/18/17 1734 02/19/17 0450  AST 62* 71* 88* 68*  ALT 67* 74* 85* 66*  ALKPHOS 462* 528* 558* 417*  BILITOT 4.9* 5.8* 6.3* 5.5*  PROT 4.7* 6.0 6.2* 4.9*  ALBUMIN 2.3* 3.1* 2.9* 2.3*   BNP (last 3 results) Thyroid function studies  Recent Labs  02/19/17 0450  TSH 1.446   Urinalysis    Component Value Date/Time   COLORURINE AMBER (A) 02/18/2017 1947   APPEARANCEUR CLEAR 02/18/2017 1947   LABSPEC 1.012 02/18/2017 1947   PHURINE 7.0 02/18/2017 1947   GLUCOSEU NEGATIVE 02/18/2017 1947   HGBUR NEGATIVE 02/18/2017 1947   HGBUR large 04/13/2010 1228   [place] (A) 02/18/2017 [place] 02/18/2017 1947   PROTEINUR NEGATIVE 02/18/2017 1947   UROBILINOGEN 0.2 04/13/2010 1228   NITRITE NEGATIVE 02/18/2017 1947   LEUKOCYTESUR NEGATIVE 02/18/2017 1947    Discussed in detail with Mercedes Mitchell's spouse at bedside. Updated care and answered questions.  Time coordinating discharge: Less than 30 minutes  SIGNED:  Vernell Leep, MD, FACP, FHM. [facility] Pager [phone]  If 7PM-7AM, please contact night-coverage [email or web address] Password [email or web address] 02/19/2017, 5:10 PM

## 2017-02-19 NOTE — Progress Notes (Signed)
ANTICOAGULATION CONSULT NOTE - Initial Consult  Pharmacy Consult for IV heparin (on PTA Lovenox) Indication: DVT  No Known Allergies  Patient Measurements: Height: 5\' 6"  (167.6 cm) Weight: 108 lb 0.4 oz (49 kg) IBW/kg (Calculated) : 59.3 Heparin Dosing Weight: 49 kg  Vital Signs: Temp: 98.6 F (37 C) (06/30 1355) Temp Source: Oral (06/30 1355) BP: 106/69 (06/30 1355) Pulse Rate: 97 (06/30 1355)  Labs:  Recent Labs  02/18/17 1701 02/18/17 1734 02/19/17 0450 02/19/17 1300  HGB 10.1* 10.5* 8.8*  --   HCT 30.2* 30.7* 25.1*  --   PLT 183.0 192 162  --   APTT  --   --  36  --   LABPROT 17.8*  --  18.5*  --   INR 1.7*  --  1.53  --   HEPARINUNFRC  --   --   --  <0.10*  CREATININE 0.61 0.63 0.47  --     Estimated Creatinine Clearance: 62.2 mL/min (by C-G formula based on SCr of 0.47 mg/dL).  Assessment: 43 yoF with hx of alcoholic liver disease c/o abdominal distention on chronic Lovenox for hx of DVT.  MD changing to IV heparin pending possible paracentesis. No bolus per MD.  Today:  - Heparin 800units/hr was stopped at 11:42 for paracentesis. - Heparin drawn after returned to the floor at 14:30 was undetectable as expected.     - CBC trended down some.  Goal of Therapy:  Heparin level 0.3-0.7 units/ml Monitor platelets by anticoagulation protocol: Yes   Plan:  Spoke with Saverio Danker, ok to resume heparin right away. Resume at 800units/hr (No bolus) Daily CBC/HL Check HL in 6 hours  Romeo Rabon, PharmD, pager [phone]. 02/19/2017,3:20 PM.

## 2017-02-19 NOTE — Progress Notes (Signed)
Pt provided heel protector boots; escorted to lobby via wheel chair by Nursing Tech for discharge to home

## 2017-02-19 NOTE — Consult Note (Signed)
[facility] Nurse wound consult note Reason for Consult:Unstageable pressure injury to coccyx Wound type:Pressure Pressure Injury POA: Yes Measurement:1cm x 0.8cm with depth unable to be determined due to the presence of yellow necrotic slough Wound bed:As described above Drainage (amount, consistency, odor) Small amount of light yellow exudate on old dressing consistent with autolytic debridement  Periwound: 0.5cm of erythema surrounds ulcer Dressing procedure/placement/frequency: We will begin today the once daily applicatoin of collagenase (Santyl) to the ulcer topped with saline moistened gauze, topped with dry gauze and covered with a silicone foam dressing to dissolve the slough and reveal the red, moist wound bed desired for healing. Husband reports patient sits on a chair pad at home, but has been in bed mostly lately.  She prefers the supine position over side lying positioning.  We have also provided Prevalon pressure redistribution heel boots as she has edema in the RLE and her heels are cracked and dry. Eucerin cream is to be applied daily. [place] nursing team will not follow, but will remain available to this patient, the nursing and medical teams.  Please re-consult if needed. Thanks, [name], MSN, RN, [place], [name]  Pager# [phone]

## 2017-02-19 NOTE — Evaluation (Signed)
Physical Therapy Evaluation Patient Details Name: Mercedes Mitchell MRN: [id number] DOB: 07/13/1961 Today's Date: 02/19/2017   History of Present Illness  56yo female with alcoholic cirrhosis with ascites admitted with nausea, vomiting, and abdominal discomfort;  medical history significant of decompensated alcoholic liver disease-on transplant list, ascites, alcohol hepatitis, RLE DVT, alcohol abuse in remission  Clinical Impression  Patient evaluated by Physical Therapy with no further acute PT needs identified. All education has been completed and the patient has no further questions.  See below for any follow-up Physical Therapy or equipment needs. PT is signing off. Thank you for this referral. Pt should continue to amb as tol with staff; pt is at her baseline for mobility     Follow Up Recommendations No PT follow up    Equipment Recommendations  None recommended by PT    Recommendations for Other Services       Precautions / Restrictions Precautions Precautions: Fall Precaution Comments: sacral wound Restrictions Weight Bearing Restrictions: No      Mobility  Bed Mobility               General bed mobility comments: sitting EOB upon arrival  Transfers Overall transfer level: Needs assistance Equipment used: Rolling walker (2 wheeled) Transfers: Sit to/from Stand Sit to Stand: Min guard         General transfer comment: for safety. Cues for UE placement  Ambulation/Gait Ambulation/Gait assistance: Min guard Ambulation Distance (Feet): 200 Feet Assistive device: Rolling walker (2 wheeled) Gait Pattern/deviations: Decreased stride length;Ataxic;Wide base of support;Drifts right/left     General Gait Details: Gait slightly ataxic at times especially at the beginning. Gait improved as gait progressed, but intermittently requiring assist to maneuver RW (amb with rollator at baseline)  Stairs            Wheelchair Mobility    Modified Rankin  (Stroke Patients Only)       Balance Overall balance assessment: Needs assistance;History of Falls           Standing balance-Leahy Scale: Fair Standing balance comment: able to briefly maintain static balance without UE support             High level balance activites: Side stepping;Turns;Head turns;Direction changes High Level Balance Comments: pt is grossly unsteady with gait but no overt LOB during amb over smooth and level surface             Pertinent Vitals/Pain Pain Assessment: 0-10 Pain Score: 5  Pain Location: abd Pain Descriptors / Indicators: Sore Pain Intervention(s): Monitored during session    Home Living Family/patient expects to be discharged to:: Private residence Living Arrangements: Spouse/significant other Available Help at Discharge: Family;Neighbor;Available 24 hours/day Type of Home: House Home Access: Stairs to enter   Entrance Stairs-Number of Steps: 1 and 1 Home Layout: Two level;Able to live on main level with bedroom/bathroom Home Equipment: Walker - 4 wheels;Bedside commode;Shower seat      Prior Function Level of Independence: Independent with assistive device(s)         Comments: uses rollator most of the time     Hand Dominance   Dominant Hand: Right    Extremity/Trunk Assessment   Upper Extremity Assessment Upper Extremity Assessment: Generalized weakness;Defer to OT evaluation    Lower Extremity Assessment Lower Extremity Assessment: Generalized weakness    Cervical / Trunk Assessment Cervical / Trunk Assessment: Kyphotic  Communication   Communication: No difficulties  Cognition Arousal/Alertness: Awake/alert Behavior During Therapy: WFL for tasks assessed/performed Overall Cognitive  Status: Within Functional Limits for tasks assessed                                        General Comments General comments (skin integrity, edema, etc.): RLE edema (DVT)    Exercises      Assessment/Plan    PT Assessment Patent does not need any further PT services  PT Problem List         PT Treatment Interventions      PT Goals (Current goals can be found in the Care Plan section)  Acute Rehab PT Goals Patient Stated Goal: go home PT Goal Formulation: All assessment and education complete, DC therapy    Frequency     Barriers to discharge        Co-evaluation               AM-PAC PT "6 Clicks" Daily Activity  Outcome Measure Difficulty turning over in bed (including adjusting bedclothes, sheets and blankets)?: Total Difficulty moving from lying on back to sitting on the side of the bed? : Total Difficulty sitting down on and standing up from a chair with arms (e.g., wheelchair, bedside commode, etc,.)?: Total Help needed moving to and from a bed to chair (including a wheelchair)?: A Little Help needed walking in hospital room?: A Little Help needed climbing 3-5 steps with a railing? : A Little 6 Click Score: 12    End of Session Equipment Utilized During Treatment: Gait belt Activity Tolerance: Patient tolerated treatment well Patient left: with call bell/phone within reach;in chair Nurse Communication: Mobility status PT Visit Diagnosis: Unsteadiness on feet (R26.81)    Time: 0102-7253 PT Time Calculation (min) (ACUTE ONLY): 16 min   Charges:         PT G Codes:   PT G-Codes **NOT FOR INPATIENT CLASS** Functional Assessment Tool Used: AM-PAC 6 Clicks Basic Mobility;Clinical judgement Functional Limitation: Mobility: Walking and moving around Mobility: Walking and Moving Around Current Status (G6440): At least 1 percent but less than 20 percent impaired, limited or restricted Mobility: Walking and Moving Around Goal Status [phone]): At least 1 percent but less than 20 percent impaired, limited or restricted Mobility: Walking and Moving Around Discharge Status [phone]): At least 1 percent but less than 20 percent impaired, limited or  restricted    Kenyon Ana, PT Pager: [phone] 02/19/2017   [facility] 02/19/2017, 11:41 AM

## 2017-02-19 NOTE — Progress Notes (Signed)
Occupational Therapy Evaluation Patient Details Name: Mercedes Mitchell MRN: [id number] DOB: 1961-05-28 Today's Date: 02/19/2017    History of Present Illness 56yo female with alcoholic cirrhosis with ascites admitted with nausea, vomiting, and abdominal discomfort;     Clinical Impression   Patient appears to be at baseline with ADLs at this time. No further OT needs identified. Will sign off.    Follow Up Recommendations  Supervision/Assistance - 24 hour    Equipment Recommendations  None recommended by OT    Recommendations for Other Services PT consult     Precautions / Restrictions Precautions Precautions: Fall Precaution Comments: sacral wound Restrictions Weight Bearing Restrictions: No      Mobility Bed Mobility               General bed mobility comments: sitting EOB upon arrival  Transfers Overall transfer level: Needs assistance Equipment used: Rolling walker (2 wheeled) Transfers: Sit to/from Stand Sit to Stand: Min guard              Balance                                           ADL either performed or assessed with clinical judgement   ADL Overall ADL's : Needs assistance/impaired Eating/Feeding: Independent;Sitting   Grooming: Set up   Upper Body Bathing: Set up;Sitting   Lower Body Bathing: Minimal assistance;Sit to/from stand   Upper Body Dressing : Set up;Sitting   Lower Body Dressing: Minimal assistance;Sit to/from stand   Toilet Transfer: Minimal assistance;Ambulation;BSC;RW           Functional mobility during ADLs: Minimal assistance;Rolling walker General ADL Comments: unsteady on her feet     Vision         Perception     Praxis      Pertinent Vitals/Pain Pain Assessment: 0-10 Pain Score: 5  Pain Location: abd Pain Descriptors / Indicators: Sore Pain Intervention(s): Monitored during session     Hand Dominance Right   Extremity/Trunk Assessment Upper Extremity  Assessment Upper Extremity Assessment: Generalized weakness   Lower Extremity Assessment Lower Extremity Assessment: Defer to PT evaluation   Cervical / Trunk Assessment Cervical / Trunk Assessment: Kyphotic   Communication Communication Communication: No difficulties   Cognition Arousal/Alertness: Awake/alert Behavior During Therapy: WFL for tasks assessed/performed Overall Cognitive Status: Within Functional Limits for tasks assessed                                     General Comments  edema right leg    Exercises     Shoulder Instructions      Home Living Family/patient expects to be discharged to:: Private residence Living Arrangements: Spouse/significant other Available Help at Discharge: Family;Neighbor;Available 24 hours/day Type of Home: House Home Access: Stairs to enter Entrance Stairs-Number of Steps: 1 and 1   Home Layout: Two level;Able to live on main level with bedroom/bathroom     Bathroom Shower/Tub: Occupational psychologist: Standard     Home Equipment: Environmental consultant - 4 wheels;Bedside commode;Shower seat          Prior Functioning/Environment Level of Independence: Independent with assistive device(s)        Comments: uses rollator most of the time        OT Problem List: Decreased strength;Impaired  balance (sitting and/or standing);Decreased safety awareness;Decreased knowledge of use of DME or AE;Decreased activity tolerance;Pain      OT Treatment/Interventions:      OT Goals(Current goals can be found in the care plan section) Acute Rehab OT Goals Patient Stated Goal: go home OT Goal Formulation: All assessment and education complete, DC therapy  OT Frequency:     Barriers to D/C:            Co-evaluation              AM-PAC PT "6 Clicks" Daily Activity     Outcome Measure Help from another person eating meals?: None Help from another person taking care of personal grooming?: A Little Help from  another person toileting, which includes using toliet, bedpan, or urinal?: A Little Help from another person bathing (including washing, rinsing, drying)?: A Little Help from another person to put on and taking off regular upper body clothing?: A Little Help from another person to put on and taking off regular lower body clothing?: A Lot 6 Click Score: 18   End of Session Equipment Utilized During Treatment: Rolling walker;Gait belt Nurse Communication: Mobility status  Activity Tolerance: Patient tolerated treatment well Patient left: with call bell/phone within reach;in bed;with bed alarm set  OT Visit Diagnosis: Unsteadiness on feet (R26.81);Muscle weakness (generalized) (M62.81)                Time: 1281-1886 OT Time Calculation (min): 20 min Charges:  OT General Charges $OT Visit: 1 Procedure OT Evaluation $OT Eval Low Complexity: 1 Procedure G-Codes: OT G-codes **NOT FOR INPATIENT CLASS** Functional Assessment Tool Used: Clinical judgement Functional Limitation: Self care Self Care Current Status (L7373): At least 20 percent but less than 40 percent impaired, limited or restricted Self Care Goal Status (G6815): At least 20 percent but less than 40 percent impaired, limited or restricted Self Care Discharge Status [phone]): At least 20 percent but less than 40 percent impaired, limited or restricted    Amil Bouwman A Yanitza Shvartsman 02/19/2017, 11:36 AM

## 2017-02-19 NOTE — Discharge Instructions (Signed)

## 2017-02-20 ENCOUNTER — Telehealth: Payer: Self-pay | Admitting: Gastroenterology

## 2017-02-20 NOTE — Telephone Encounter (Signed)
She called this evening, noticed blood in her left eye.  The eye is not painful and the blood is not affecting her vision.  No trauma that she is aware of, but may have scratched at the eye.  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  She knows that if it gets worse, or if the eye becomes painful or vision is affected she should go to the ER.  She is going to call the office tomorrow for advice about whether she should continue the lovenox (perhaps at lower dose?).

## 2017-02-21 ENCOUNTER — Telehealth: Payer: Self-pay | Admitting: Internal Medicine

## 2017-02-21 ENCOUNTER — Other Ambulatory Visit (HOSPITAL_COMMUNITY): Payer: 59 | Attending: Psychiatry | Admitting: Psychology

## 2017-02-21 ENCOUNTER — Encounter (HOSPITAL_COMMUNITY): Payer: Self-pay | Admitting: Psychology

## 2017-02-21 ENCOUNTER — Other Ambulatory Visit (HOSPITAL_COMMUNITY): Payer: Self-pay | Admitting: Medical

## 2017-02-21 DIAGNOSIS — F1421 Cocaine dependence, in remission: Secondary | ICD-10-CM | POA: Diagnosis not present

## 2017-02-21 DIAGNOSIS — K7031 Alcoholic cirrhosis of liver with ascites: Secondary | ICD-10-CM | POA: Diagnosis not present

## 2017-02-21 DIAGNOSIS — F1210 Cannabis abuse, uncomplicated: Secondary | ICD-10-CM | POA: Diagnosis not present

## 2017-02-21 DIAGNOSIS — F1020 Alcohol dependence, uncomplicated: Secondary | ICD-10-CM | POA: Insufficient documentation

## 2017-02-21 LAB — PATHOLOGIST SMEAR REVIEW

## 2017-02-21 NOTE — Telephone Encounter (Signed)
This is out of my scope of practice. Obviously, for pain or active bleeding, go the ER. Regarding anticoagulation therapy, this needs to be addressed by prescribing physician or primary physician.

## 2017-02-21 NOTE — Telephone Encounter (Signed)
Patient is notified to contact her primary care Dr. Martinique, who should be monitoring this for her.

## 2017-02-21 NOTE — Telephone Encounter (Signed)
Patient had some bleeding in her left eye.  Recently discharged on lovenox for recent DVT.  She called Dr. Ardis Hughs on call yesterday to report a small amount of bleeding in her left eye.  She was instructed to call back to the office today and ask if lovenox does should be decreased? She reports that the blood seems to have decreased since yesterday.  She reports that she has not pain and the blood is not obstructing her vision.  Dr. Henrene Pastor you are MD of the day.  Please review and advise.

## 2017-02-21 NOTE — Progress Notes (Signed)
    Daily Group Progress Note  Program: CD-IOP   02/21/2017 [name] [id number]  Diagnosis:  No diagnosis found.   Sobriety Date: 01/23/17  Group Time: 1-2:30pm  Participation Level: Active  Behavioral Response: Appropriate  Type of Therapy: Process Group  Interventions: Supportive  Topic: Process: the first half of group was spent in process. Members shared about any issues or problems they are facing in early recovery. They discussed bright or 'shining moments' as well as challenges or 'speed bumps'. The importance of planning for the upcoming holiday next week was reiterated throughout group today.  Group Time: 2:30-4pm  Participation Level: Active  Behavioral Response: Appropriate  Type of Therapy: Psycho-education Group  Interventions: Strength-based  Topic: Psycho-Ed: Stages of development, Part 2 and Affirmations. The second half of group was spent in a psycho-ed closing the psycho-ed from yesterday that identified the different developmental stages of childhood up to adulthood. A handout was passed around that identified "Affirmations for Rebonding". These affirmations provide what an adult might need now, based on what he/she did not receive at the appropriate time in their childhood. Members went over the handout together and identified those affirmations that resonated with them today. The assignment for the weekend was for each member to practice saying or reading an affirmation at least once every day. The group will discuss this during the next time we meet, on Monday, July 2.   Summary: The patient reported she slept a little m ore last night. She had gotten a new script from the Market researcher and 'it seemed to work better'. She admitted that she also sleeps with the TV on. It was pointed out that this is very poor sleep hygiene and the first thing a sleep therapist would want to change. The patient reported she and her husband went to dinner after group  yesterday at the [facility]'. "We had a good meal", she noted. The patient asked for feedback from her fellow group members. she wondered what she should say to her neighbors. They are calling and asking her what is wrong with her? She admitted that she doesn't feel like telling them about her cirrhosis. In fact, she really doesn't want to speak with them at all. The patient noted that at one time, her home was 'party central' and neighbors came over to drink, smoke pot and there was a lot of cocaine use as well. She wants everyone to know that they are no longer welcome to drink or drug at her house. She received helpful feedback from the group and seemed very satisfied and she decided how to address the neighbors' inquiries. In the psycho-ed, the patient found the affirmations very peaceful and agreed she would read them over the weekend. The patient seemed stronger today than in past sessions and she responded well to this intervention.    UDS collected: No Results:   AA/NA attended?: No  Sponsor?: No   Brandon Melnick, LCAS 02/21/2017 4:34 PM

## 2017-02-22 ENCOUNTER — Telehealth: Payer: Self-pay

## 2017-02-22 ENCOUNTER — Telehealth: Payer: Self-pay | Admitting: Family Medicine

## 2017-02-22 ENCOUNTER — Encounter (HOSPITAL_COMMUNITY): Payer: Self-pay | Admitting: Psychology

## 2017-02-22 ENCOUNTER — Other Ambulatory Visit: Payer: Self-pay | Admitting: Family Medicine

## 2017-02-22 DIAGNOSIS — B370 Candidal stomatitis: Secondary | ICD-10-CM

## 2017-02-22 MED ORDER — NYSTATIN 100000 UNIT/ML MT SUSP: 5.0000 mL | Freq: Four times a day (QID) | OROMUCOSAL | 0 refills | Status: DC

## 2017-02-22 NOTE — Telephone Encounter (Signed)
It is Ok to authorized for OT request. Thanks, BJ

## 2017-02-22 NOTE — Telephone Encounter (Signed)
Cat @ [facility] called to obtain verbal orders for [facility] and OT for pt. They are requesting 2x a week for 4 weeks.  Dr. Martinique - Please advise. Thanks!

## 2017-02-22 NOTE — Telephone Encounter (Signed)
It Ok to start [facility] PT as requested.  Thanks, BJ

## 2017-02-22 NOTE — Telephone Encounter (Signed)
Rx for Nystatin sl was sent.  Thanks, BJ

## 2017-02-22 NOTE — Telephone Encounter (Signed)
Mercedes Mitchell with [facility] would like verbal orders for home health PT 1 wk/ 1 2 wk /2

## 2017-02-22 NOTE — Progress Notes (Signed)
    Daily Group Progress Note  Program: CD-IOP   02/22/2017 Mercedes Mitchell [id number]  Diagnosis:  No diagnosis found.   Sobriety Date: 01/23/17  Group Time: 1-2:30pm  Participation Level: Active  Behavioral Response: Sharing  Type of Therapy: Process Group  Interventions: Supportive  Topic: Process/Chair Yoga:  The first half of group was spent in process. Members shared about the past weekend and identified the shining moments and speed bumps in early recovery. A new group member was present today and he introduced himself and described a long history of alcoholic drinking. During process, this new patient met with the Investment banker, operational. For forty minutes, the group was led in a chair yoga by a counselor and visiting facilitator. It was gentle and everyone engaged to the best of their abilities. At the conclusion, everyone agreed that the exercise had made him or her feel more relaxed.   Group Time: 2:30-4pm  Participation Level: Minimal  Behavioral Response: Sharing  Type of Therapy: Psycho-education Group  Interventions: Strength-based  Topic:  Psycho-Ed: The Cycle of Addictive Thinking and Use. The second half of group was spent in a psycho-education session on relapse Prevention. The cycle of addiction was discussed at length. Members were asked to identify what they might do to challenge the thinking, which is the first part, and/or the second part, which is the habitual patterns of preparation, the rituals, which occur prior to use. Disrupting the thinking and changing those old patterns is what one must do to avoid getting caught into them and dragged onto the actual use. Members complained about the difficulty or inconvenience of even getting out of the other side of the bed. Their reluctance and resistance to change was surprising. Prior to the group ending. Members shared about the upcoming holiday and what their plans were for July 4. The group will return on  Thursday, July 5.   Summary: The patient reported having had a good day yesterday and there was no arguing. When questioned further, she admitted that her husband is very frustrated with her and gets angry because she cannot seem to take care of herself and often needs his help. She explained that her husband misunderstands what she is saying or asking. The patient pointed out that her left eye was very red and it had begun 'bleeding this morning'. She had phoned her doctor, but he encouraged her to watch it and call if it got worse. The patient seemed more confused when she explained she had been in the hospital the night before last and reported they had pulled off over 4 liters of fluid from her abdominal cavity. She also shared that she had developed a bed sore on her butt and the wound specialist had worked with her. She had to sit a certain way or it put pressure on the wound and hurt. Despite these reports, the patient engaged as best she could in the chair yoga. At the conclusion, she agreed that it had made her feel very relaxed.She said little in the psycho-ed on the addictive cycle and reported no specific plans for the upcoming holiday. The patient seemed more confused today than she has in previous sessions. We will follow closely in the days ahead.   UDS collected: No Results:   AA/NA attended?: No  Sponsor?: No   Brandon Melnick, LCAS 02/22/2017 2:09 PM

## 2017-02-22 NOTE — Telephone Encounter (Signed)
Pt just dc'd from hospital and has developed thrush from all the abx. Her home health nurse advised pt to call her pcp for Rx.  Pt has hospital fup on 03/02/17.has no way to get here today.  Would like to know if dr Martinique will call in a script for the thrush.   Rives [address] [place]

## 2017-02-23 ENCOUNTER — Other Ambulatory Visit (HOSPITAL_COMMUNITY): Payer: Self-pay | Admitting: Medical

## 2017-02-24 ENCOUNTER — Other Ambulatory Visit (HOSPITAL_COMMUNITY): Payer: 59 | Admitting: Psychology

## 2017-02-24 DIAGNOSIS — F1210 Cannabis abuse, uncomplicated: Secondary | ICD-10-CM

## 2017-02-24 DIAGNOSIS — F1020 Alcohol dependence, uncomplicated: Secondary | ICD-10-CM | POA: Diagnosis not present

## 2017-02-24 LAB — CULTURE, BODY FLUID W GRAM STAIN -BOTTLE: Culture: NO GROWTH

## 2017-02-24 LAB — CULTURE, BODY FLUID-BOTTLE

## 2017-02-24 NOTE — Telephone Encounter (Signed)
LM on confidential vm with verbal orders. Nothing further needed.

## 2017-02-25 ENCOUNTER — Encounter (HOSPITAL_COMMUNITY): Payer: Self-pay | Admitting: Psychology

## 2017-02-25 NOTE — Progress Notes (Signed)
Mercedes Mitchell is a 55 y.o. female patient. CD-IOP: Treatment Planning Session. Met with this patient after group today. I explained the importance of meeting weekly for our individual session. The patient has been very ill and missed numerous group sessions and she was agreeable to meeting with me after the group session and identifying her treatment goals. The patient is adamant that she will never drink alcohol again, but she agreed that staying alcohol-free is her #1 goal of treatment. She also agreed that she will need support and intends to get back to the AA meetings. When asked about any other goals, the patient identified doing whatever her endocrinologist says regarding her liver. It is unclear how badly damaged her liver is as a result of the cirrhosis and it has not been determined whether she will need to seek a liver transplant, but whatever it takes, "I will do it". The patient is using a walker and her right foot and ankle in particular are still very swollen along with some swelling around her tummy. However, she is a little livelier today and is in good spirits. The documentation was reviewed, signed and completed accordingly. We will follow the patient closely in the days ahead.         , LCAS 

## 2017-02-25 NOTE — Progress Notes (Signed)
    Daily Group Progress Note  Program: CD-IOP   02/25/2017 [name] [id number]  Diagnosis:  Alcohol use disorder, severe, dependence ([place])  Cannabis abuse, episodic use   Sobriety Date: 5/3  Group Time: 1-2:30  Participation Level: Active  Behavioral Response: Appropriate and Sharing  Type of Therapy: Process Group  Interventions: CBT and Strength-based  Topic: Patients were active and engaged for 1.5 hour group process session. Counselors led discussion on pts' recovery, challenges, supports, and utilized skills outside of sessions. One pt was new today and was very active and open in sharing. Drug tests were collected from some pts. Pts shared about their recent July fourth holiday and not meeting for group yesterday. Counselor also led discussion utilizing topical recovery-based themes.      Group Time: 2:30-4  Participation Level: Active  Behavioral Response: Appropriate and Sharing  Type of Therapy: Psycho-education Group  Interventions: Psychosocial Skills: Daily self inventory  Topic: Patients were active and engaged for 1.5 hour psychoeducation session. Counselors led lesson on "taking a daily self inventory" and provided pts with handout. Pts were instructed to fill out self inventory for the weekend as homework. Counselors then led activity titled "Three Circles" which helps pts identify behavioral and external triggers. Pts shared openly and stated that session was helpful.   Summary: Patient as active and engaged in session and presented as more upbeat and lucid than any previous session. She denied attending any 12 meetings and was reminded that meetings are a requirement of the program. Pt agreed and stated her current and temporary physical disabilities keep her from driving herself to meetings and that she plans to attend some 12 meetings in the next 2 weeks. Pt stated she had a nice, quiet holiday and was happy that her neighbors did not ask her  to throw a party. Pt admitted she disposed of some marijuana she found in a drawer in her house and that her husband was witness to this. Pt stated he PT is going well and that she is starting to walk more effectively w/o the walker. Pt spoke on "excuses" and how she had an excuse for all her problematic behavior such as being "too tired or it's 5 oclock somewhere".     UDS collected: No Results: negative  AA/NA attended?: No  Sponsor?: No  Wes Swan, LPCA, LCASA 02/25/2017 8:36 AM

## 2017-02-27 ENCOUNTER — Telehealth: Payer: Self-pay | Admitting: Family Medicine

## 2017-02-27 DIAGNOSIS — B370 Candidal stomatitis: Secondary | ICD-10-CM

## 2017-02-28 ENCOUNTER — Encounter: Payer: Self-pay | Admitting: Medical

## 2017-02-28 ENCOUNTER — Other Ambulatory Visit (HOSPITAL_COMMUNITY): Payer: 59 | Admitting: Psychology

## 2017-02-28 DIAGNOSIS — F1020 Alcohol dependence, uncomplicated: Secondary | ICD-10-CM

## 2017-02-28 DIAGNOSIS — K7031 Alcoholic cirrhosis of liver with ascites: Secondary | ICD-10-CM

## 2017-02-28 DIAGNOSIS — F1210 Cannabis abuse, uncomplicated: Secondary | ICD-10-CM

## 2017-02-28 NOTE — Telephone Encounter (Signed)
Patient called to check the status on the Rx. She said that her throat is burning and wondering if she can have a refill of the medication.

## 2017-02-28 NOTE — Telephone Encounter (Signed)
I thought we just fill Rx a week ago. Thanks, BJ

## 2017-03-01 ENCOUNTER — Encounter (HOSPITAL_COMMUNITY): Payer: Self-pay | Admitting: Psychology

## 2017-03-01 NOTE — Progress Notes (Signed)
    Daily Group Progress Note  Program: CD-IOP   03/01/2017 Ammanda Barber Kendrix [id number]  Diagnosis:  No diagnosis found.   Sobriety Date: 12/23/16  Group Time: 1-2:30pm  Participation Level: Active  Behavioral Response: Appropriate  Type of Therapy: Process Group  Interventions: Supportive  Topic: Process: the first half of group was spent in process. Members shared about the past weekend and any challenges (speedbumps) or successes (shining moments) they had in early recovery. The medical director met with the newest group member and completed a medication check with another. Random drug tests were collected from three group members.  Group Time: 2:30-4pm  Participation Level: Active  Behavioral Response: Sharing  Type of Therapy: Psycho-education Group  Interventions: Strength-based  Topic: Psycho-Education: Daily Inventory/Guided Meditation. A handout with a checklist of assets and liabilities was provided to group members last week. They were instructed to complete the handout on a daily basis. The findings of this daily assignment were shared in the psycho-ed. Members shared about how taking this daily inventory may have assisted them in their recovery. Two members admitted they had forgotten about the assignment and not completed the inventory.  Near the end of the session, the counselor read a guided meditation. Members provided feedback upon completing the meditation. Everyone present stated it had been soothing and relaxing. Members were reminded to complete the inventory and bring them on Wednesday.   Summary: The patient reported a good weekend. She attended two AA meetings over the weekend. Her husband accompanied her. The patient noted that she and her husband got along better over the weekend and there were not the arguments they had been having earlier in the week. Another group member commented on how much better and stronger the patient looked. She lifted her  right foot and pointed out how much the swelling had gone down since she was last week. The patient reported she had met with her physical therapist this morning and she was tired after their exercises. She is much stronger thought, today and had more energy throughout the session. The patient admitted she was feeling very anxious about her appointment this Wednesday in [place] where she will be meeting with liver specialists. She became tearful and reported, "I'm scared". The counselor reminded her they are just meeting to identify the extent of damage to her liver due to cirrhosis and they might determine she does not need a transplant. When asked about her Daily Inventory, the patient admitted she had not completed it and had misplaced it. She apologized, but assured the group she would have it completed when she returns on Thursday. The patient reported she enjoyed the guided meditation and felt very relaxed, but she used a different image, that of the beach. A drug test was collected today from this patient. She will be absent on Wednesday with an excused absence to travel to [place], but should be back in group on Thursday.     UDS collected: Yes Results: pending  AA/NA attended?: YesFriday and Sunday  Sponsor?: No    , LCAS 03/01/2017 10:45 AM 

## 2017-03-02 ENCOUNTER — Encounter (HOSPITAL_COMMUNITY): Payer: Self-pay | Admitting: Psychology

## 2017-03-02 ENCOUNTER — Ambulatory Visit: Payer: Self-pay | Admitting: Family Medicine

## 2017-03-02 ENCOUNTER — Other Ambulatory Visit (HOSPITAL_COMMUNITY): Payer: 59

## 2017-03-02 LAB — PROTIME-INR: INR: 1.3 — AB (ref 0.9–1.1)

## 2017-03-02 NOTE — Progress Notes (Signed)
Mercedes Mitchell is a 56 y.o. female patient. CD-IOP: Excused absence. The patient will be excused from group today. She is traveling to [place] with her husband to meet with liver specialists. They will be meeting to evaluate the condition of her liver due to the cirrhosis and whether she will need to seek entry onto the liver transplant list. The patient is scheduled to return to group tomorrow.         Brandon Melnick, LCAS

## 2017-03-03 ENCOUNTER — Other Ambulatory Visit (HOSPITAL_COMMUNITY): Payer: 59 | Admitting: Psychology

## 2017-03-03 ENCOUNTER — Encounter (HOSPITAL_BASED_OUTPATIENT_CLINIC_OR_DEPARTMENT_OTHER): Payer: Managed Care, Other (non HMO) | Attending: Internal Medicine

## 2017-03-03 DIAGNOSIS — K7031 Alcoholic cirrhosis of liver with ascites: Secondary | ICD-10-CM | POA: Insufficient documentation

## 2017-03-03 DIAGNOSIS — L97829 Non-pressure chronic ulcer of other part of left lower leg with unspecified severity: Secondary | ICD-10-CM | POA: Diagnosis not present

## 2017-03-03 DIAGNOSIS — E43 Unspecified severe protein-calorie malnutrition: Secondary | ICD-10-CM | POA: Diagnosis not present

## 2017-03-03 DIAGNOSIS — F1020 Alcohol dependence, uncomplicated: Secondary | ICD-10-CM | POA: Diagnosis not present

## 2017-03-03 DIAGNOSIS — L89153 Pressure ulcer of sacral region, stage 3: Secondary | ICD-10-CM | POA: Insufficient documentation

## 2017-03-03 DIAGNOSIS — Z87891 Personal history of nicotine dependence: Secondary | ICD-10-CM | POA: Diagnosis not present

## 2017-03-03 DIAGNOSIS — Z86718 Personal history of other venous thrombosis and embolism: Secondary | ICD-10-CM | POA: Diagnosis not present

## 2017-03-04 ENCOUNTER — Encounter (HOSPITAL_COMMUNITY): Payer: Self-pay | Admitting: Psychology

## 2017-03-04 NOTE — Progress Notes (Signed)
    Daily Group Progress Note  Program: CD-IOP   03/04/2017 [name] [id number]  Diagnosis:  Alcohol use disorder, severe, dependence ([place])   Sobriety Date: 5/3  Group Time: 1-2:30  Participation Level: Active  Behavioral Response: Appropriate and Sharing  Type of Therapy: Process Group  Interventions: CBT, Strength-based, Supportive and Reframing  Topic: Pt participated openly and shared actively about their struggles and successes in early recovery. UDS were collected from some pts. 2 Group members shared about their time since missing last session. Pts discussed topics about recovery which fostered insight and understanding of early sobriety.      Group Time: 2:30-4  Participation Level: Active  Behavioral Response: Appropriate and Sharing  Type of Therapy: Psycho-education Group  Interventions: CBT and Psychosocial Skills: Communication  Topic: Pts participated openly and shared actively in psychoeducation session about communication. Counselors discussed pt's refusal skills and had pts practice skills in session. Counselors did a recap of last group session to emphasize the importance of healthy assertiveness in communication.    Summary: Patient reported she had a good few days and was sad to miss group the previous session but was happy to report she had a good doctors appointment to evaluate her need for a new liver. Pt reported she went to [place] to meet with a liver transplant specialist who gave her positive news and was encouraging. Pt did not attend any AA meetings since her last session but plans to attend some this weekend. She admitted she was feeling peaceful and encouraged about her status. She states she was encouraged to eat more which she was happy about. Pt practiced refusal skills in session, skillfully.   UDS collected: No Results: negative  AA/NA attended?: No  Sponsor?: No  Wes Swan, LPCA LCASA 03/04/2017 4:15 PM

## 2017-03-06 NOTE — Progress Notes (Signed)
HPI:   Mercedes Mitchell is a 56 y.o. female, who is here today with her husband to follow on recent hospitalization.    She was seen on 02/09/17 for hospital f/u, she was referred to the ER because RLE edema. She was admitted from 6/20 to 02/13/17: DVT RLE, started on Lovenox. LE [place] 02/10/17: Findings consistent with acute deep vein thrombosis involving the mid femoral vein coursing through the common femoral to just below the sapheno femoral junction of the right lower extremity. No evidence of superficial thrombosis involving the right lower extremity.  Readmitted on 02/18/09 and discharged 02/19/17 due to sacral decubitus ulcer stage III, decompensated alcoholic cirrhosis. Paracentesis, 4.2 L removed. She is on Prednisone 40 mg daily for alcoholic hepatitis. She is also on Spironolactone 1200 mg daily, furosemide 80 mg daily, and lactulose 20 g twice daily. She is tolerating medications, diarrhea  "is not bad", she has change Lactulose dose/administration time depending of amount of stools and this has helped.  In general she feels better after the last hospitalization. She is eating better, reporting that she has gained some weight. Abdominal distension is not as severe as it was a few days ago and ascitics does not seem to increase as fast as it did. She has not noted fever or chills. Denies abdominal pain, nausea,vomiting, or melena/blood in stools.  She denies falls since hospital discharge.  She is following with wound clinic for sacral stage III decubitus ulcer, which is healing well. She needs refills on Hydrocerin, which she applies on wound daily.  She has [place] arrangements: OT,PT,wound care.  She met with Dr Zollie Scale, who is part liver transplant team in [facility]. She needs pap smear and mammogram scheduled.  Lab Results  Component Value Date   WBC 33.4 (H) 02/19/2017   HGB 8.8 (L) 02/19/2017   HCT 25.1 (L) 02/19/2017   MCV 105.0 (H) 02/19/2017   PLT 162  02/19/2017   Lab Results  Component Value Date   CREATININE 0.47 02/19/2017   BUN 17 02/19/2017   NA 135 02/19/2017   K 3.9 02/19/2017   CL 98 (L) 02/19/2017   CO2 27 02/19/2017   Lab Results  Component Value Date   ALT 66 (H) 02/19/2017   AST 68 (H) 02/19/2017   ALKPHOS 417 (H) 02/19/2017   BILITOT 5.5 (H) 02/19/2017    Following with GI, Dr Hilarie Fredrickson.  She is also going to the outpatient behavioral clinic, 3 times per week. Also attending [place] meetings, today is her 64 th day alcohol free. She is very motivated and eager to continue with recovery process. Sleeping better since Trazodone was changed to Seroquel.  Oral thrush has improved, she is currently on nystatin solution. She denies odynophagia or dysphagia.  She has no new concerns today.   Review of Systems  Constitutional: Positive for fatigue. Negative for appetite change, chills and fever.  HENT: Negative for mouth sores, nosebleeds, trouble swallowing and voice change.   Eyes: Negative for visual disturbance.  Respiratory: Negative for cough, shortness of breath and wheezing.   Cardiovascular: Negative for chest pain, palpitations and leg swelling.  Gastrointestinal: Positive for diarrhea. Negative for abdominal pain, blood in stool, nausea and vomiting.  Genitourinary: Negative for decreased urine volume, dysuria and hematuria.  Musculoskeletal: Positive for gait problem. Negative for myalgias.  Skin: Positive for wound. Negative for rash.  Neurological: Positive for tremors. Negative for syncope, weakness and headaches.  Hematological: Negative for adenopathy.  Psychiatric/Behavioral:  Negative for confusion, hallucinations, sleep disturbance and suicidal ideas. The patient is nervous/anxious.       Current Outpatient Prescriptions on File Prior to Visit  Medication Sig Dispense Refill  . bacitracin ointment Apply 1 application topically 3 (three) times daily as needed for wound care.    . camphor-menthol  (SARNA) lotion Apply topically as needed for itching. 222 mL 0  . collagenase (SANTYL) ointment Apply topically daily. Apply to Unstageable pressure injury to coccyx once daily 30 g 0  . docusate sodium (COLACE) 100 MG capsule Take 1 capsule (100 mg total) by mouth 2 (two) times daily. 10 capsule 0  . enoxaparin (LOVENOX) 60 MG/0.6ML injection Inject 0.45 mLs (45 mg total) into the skin daily. 30 Syringe 0  . feeding supplement, ENSURE ENLIVE, (ENSURE ENLIVE) LIQD Take 237 mLs by mouth 3 (three) times daily between meals. 90 Bottle 0  . folic acid (FOLVITE) 1 MG tablet Take 1 tablet (1 mg total) by mouth daily. 100 tablet 5  . furosemide (LASIX) 20 MG tablet Take 4 tablets (80 mg total) by mouth daily.    Marland Kitchen lactulose (CHRONULAC) 10 GM/15ML solution Take 30 mLs (20 g total) by mouth 2 (two) times daily. 1800 mL 1  . Multiple Vitamin (MULTIVITAMIN WITH MINERALS) TABS tablet Take 1 tablet by mouth daily. 30 tablet 0  . nystatin (MYCOSTATIN) 100000 UNIT/ML suspension TAKE 5ML BY MOUTH FOUR TIMES A DAY 600 mL 0  . ondansetron (ZOFRAN) 4 MG tablet Take 1 tablet (4 mg total) by mouth every 8 (eight) hours as needed for nausea or vomiting. 20 tablet 0  . pantoprazole (PROTONIX) 40 MG tablet Take 1 tablet (40 mg total) by mouth 2 (two) times daily. 60 tablet 1  . prednisoLONE (PRELONE) 15 MG/5ML SOLN Take 13.3 mLs (40 mg total) by mouth daily before breakfast. Continue until tapered by GI. 450 mL 0  . QUEtiapine (SEROQUEL) 25 MG tablet Take 1 tablet (25 mg total) by mouth at bedtime. May take 2 tablets if needed (Patient taking differently: Take 25 mg by mouth at bedtime as needed (sleep). May take 2 tablets if needed) 60 tablet 2  . spironolactone (ALDACTONE) 50 MG tablet Take 4 tablets (200 mg total) by mouth daily.    Marland Kitchen thiamine 100 MG tablet Take 1 tablet (100 mg total) by mouth daily. 30 tablet 0   No current facility-administered medications on file prior to visit.      Past Medical History:    Diagnosis Date  . 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  paracentesis 2.6 liters 5/4: no SBP.    Marland Kitchen Alcoholism ([place]) 12/2016  . Candida esophagitis ([place])   . Cirrhosis ([place])   . DVT (deep venous thrombosis) ([place])   . Dysphagia 2011  . Esophagitis   . Hepatic steatosis   . Hiatal hernia   . Hiatal hernia   . Macrocytic anemia 12/2016  . Malnutrition ([place]) 12/2016  . Panic type anxiety neurosis 2011  . Portal hypertension ([place])   . Portal hypertensive gastropathy (HCC)    No Known Allergies  Social History   Social History  . Marital status: Married    Spouse name: N/A  . Number of children: N/A  . Years of education: N/A   Occupational History  . Unemployed    Social History Main Topics  . Smoking status: Former Smoker    Types: Cigarettes  . Smokeless tobacco: Never  Used     Comment: Reports she quit "about 6 months ago"  . Alcohol use Yes     Comment: Last drink: 36 days ago   . Drug use: No     Comment: Last use Cocaine 11/17, reports last use "over a year", [facility] 2/18, reports "a year" ago  . Sexual activity: Not Asked   Other Topics Concern  . None   Social History Narrative   Married, children, not working    Vitals:   03/07/17 1200  BP: 100/70  Pulse: (!) 103  Resp: 16  O2 sat at RA 96% Body mass index is 16.19 kg/m. Wt Readings from Last 3 Encounters:  03/07/17 100 lb 5 oz (45.5 kg)  02/18/17 108 lb 0.4 oz (49 kg)  02/18/17 108 lb (49 kg)    Physical Exam  Nursing note and vitals reviewed. Constitutional: She is oriented to person, place, and time. She appears well-developed. She appears cachectic. No distress.  HENT:  Head: Atraumatic.  Mouth/Throat: Oropharynx is clear and moist and mucous membranes are normal.  Under tongue whitish patches on lateral aspect.  Eyes: Pupils are equal, round, and reactive to light. Conjunctivae and EOM are normal. Scleral icterus is  present.  Mild horizontal nystagmus.  Cardiovascular: Regular rhythm.  Tachycardia present.   No murmur heard. Pulses:      Dorsalis pedis pulses are 2+ on the right side, and 2+ on the left side.  Respiratory: Effort normal and breath sounds normal. No respiratory distress.  GI: Soft. She exhibits ascites. She exhibits no mass. There is no tenderness.  Musculoskeletal: She exhibits no edema.  Lymphadenopathy:    She has no cervical adenopathy.  Neurological: She is alert and oriented to person, place, and time. She has normal strength. She displays tremor. Gait abnormal.  Gait unstable, wide base, and assisted by walker.  Skin: Skin is warm. Abrasion and ecchymosis (mainly on UE's, forearms) noted. No erythema.     1 cm superficial ulcer, clean, no necrotic tissue or drainage.  Psychiatric: She has a normal mood and affect.  Appropriately groomed, good eye contact.      ASSESSMENT AND PLAN:   Mercedes Mitchell was seen today for hospitalization follow-up.  Diagnoses and all orders for this visit:  Acute deep vein thrombosis (DVT) of right femoral vein (HCC)  LE edema resolved. No changes in current treatment, some side effects discussed. She will complete 3 months of Lovenox. Instructed about warning signs.   Alcoholic cirrhosis of liver with ascites ([place])  She will continue following with GI and liver transplant team. Clinically she seems to be stable. Continue attending [place] meetings.   -     Comprehensive metabolic panel; Future -     CBC; Future  Sacral decubitus ulcer, stage III ([place])  Healing well. Continue following with wound clinic. Adequate nutrition and avoidance of pressure on affect area, she already has a donut pillow. Monitor for signs of infection.  -     hydrocerin (EUCERIN) CREA; Apply 1 application topically daily. Apply to bilateral legs once daily -     Adhesive Bandages PADS; 1 application by Does not apply route 3 (three) times daily as  needed.  Breast cancer screening -     MM SCREENING BREAST TOMO BILATERAL; Future  Macrocytic anemia  Further recommendations will be given according to lab results.  She has another appointment today and couldn't have labs but she is coming back later this afternoon or tomorrow.  -  CBC; Future  Oral thrush  Continue oral Nystatin until all lesions resolved. F/U as needed.   She will schedule an appointment for Pap smear.   Betty G. Martinique, MD  [facility]. [place] office.

## 2017-03-07 ENCOUNTER — Encounter: Payer: Self-pay | Admitting: Family Medicine

## 2017-03-07 ENCOUNTER — Other Ambulatory Visit (HOSPITAL_COMMUNITY): Payer: 59 | Admitting: Psychiatry

## 2017-03-07 ENCOUNTER — Ambulatory Visit (INDEPENDENT_AMBULATORY_CARE_PROVIDER_SITE_OTHER): Payer: Managed Care, Other (non HMO) | Admitting: Family Medicine

## 2017-03-07 VITALS — BP 100/70 | HR 103 | Resp 16 | Ht 66.0 in | Wt 100.3 lb

## 2017-03-07 DIAGNOSIS — K7031 Alcoholic cirrhosis of liver with ascites: Secondary | ICD-10-CM

## 2017-03-07 DIAGNOSIS — Z1231 Encounter for screening mammogram for malignant neoplasm of breast: Secondary | ICD-10-CM | POA: Diagnosis not present

## 2017-03-07 DIAGNOSIS — I82411 Acute embolism and thrombosis of right femoral vein: Secondary | ICD-10-CM | POA: Diagnosis not present

## 2017-03-07 DIAGNOSIS — F1020 Alcohol dependence, uncomplicated: Secondary | ICD-10-CM | POA: Diagnosis not present

## 2017-03-07 DIAGNOSIS — Z1239 Encounter for other screening for malignant neoplasm of breast: Secondary | ICD-10-CM

## 2017-03-07 DIAGNOSIS — B370 Candidal stomatitis: Secondary | ICD-10-CM | POA: Diagnosis not present

## 2017-03-07 DIAGNOSIS — L89153 Pressure ulcer of sacral region, stage 3: Secondary | ICD-10-CM

## 2017-03-07 DIAGNOSIS — D539 Nutritional anemia, unspecified: Secondary | ICD-10-CM | POA: Diagnosis not present

## 2017-03-07 DIAGNOSIS — F1210 Cannabis abuse, uncomplicated: Secondary | ICD-10-CM

## 2017-03-07 MED ORDER — ADHESIVE BANDAGES PADS: 1.0000 "application " | MEDICATED_PAD | Freq: Three times a day (TID) | 1 refills | Status: DC | PRN

## 2017-03-07 MED ORDER — HYDROCERIN EX CREA: 1.0000 "application " | TOPICAL_CREAM | Freq: Every day | CUTANEOUS | 1 refills | Status: DC

## 2017-03-07 NOTE — Patient Instructions (Addendum)
A few things to remember from today's visit:   Acute deep vein thrombosis (DVT) of right femoral vein (HCC)  Alcoholic cirrhosis of liver with ascites ([place]) - Plan: Comprehensive metabolic panel, CBC  Sacral decubitus ulcer, stage III ([place])  Lab placed.   No changes today.   Please be sure medication list is accurate. If a new problem present, please set up appointment sooner than planned today.

## 2017-03-09 ENCOUNTER — Other Ambulatory Visit (HOSPITAL_COMMUNITY): Payer: 59 | Admitting: Psychiatry

## 2017-03-09 DIAGNOSIS — F1020 Alcohol dependence, uncomplicated: Secondary | ICD-10-CM

## 2017-03-09 DIAGNOSIS — F1210 Cannabis abuse, uncomplicated: Secondary | ICD-10-CM

## 2017-03-10 ENCOUNTER — Encounter (HOSPITAL_COMMUNITY): Payer: Self-pay | Admitting: Psychiatry

## 2017-03-10 ENCOUNTER — Other Ambulatory Visit (HOSPITAL_COMMUNITY): Payer: 59 | Admitting: Psychiatry

## 2017-03-10 DIAGNOSIS — F1020 Alcohol dependence, uncomplicated: Secondary | ICD-10-CM | POA: Diagnosis not present

## 2017-03-10 DIAGNOSIS — F1210 Cannabis abuse, uncomplicated: Secondary | ICD-10-CM

## 2017-03-10 DIAGNOSIS — L89153 Pressure ulcer of sacral region, stage 3: Secondary | ICD-10-CM | POA: Diagnosis not present

## 2017-03-10 NOTE — Progress Notes (Signed)
    Daily Group Progress Note  Program: CD-IOP   03/10/2017 [name] [id number]  Diagnosis:  Alcohol use disorder, severe, dependence ([place])  Cannabis abuse, episodic use   Sobriety Date: 5/3  Group Time: 1-2:30  Participation Level: Active  Behavioral Response: Appropriate and Sharing  Type of Therapy: Process Group  Interventions: CBT, Strength-based and Supportive  Topic: Patients were active and engaged in 1.5 hour group process session. Counselor used open questions, reflection of emotion, and validation, and CBT to grown pts insight into their presenting concerns. Pts shared openly about their struggles and successes in early recovery. UDS were collected from some members. One new pt was present and shared about his recent release from tx for addiction. This new pt met with [name] to establish care.      Group Time: 2:30-4  Participation Level: Active  Behavioral Response: Appropriate and Sharing  Type of Therapy: Psycho-education Group  Interventions: Other: psychopharmacology  Topic: Patients were active and engaged in 1.5 hour group psychoeducation session. Session was led by Einar Grad, Pharmacist. She discussed psychotropic medications and conducted an open question and answer time. Session appeared productive and pts expressed interest in subject. Topics included medications for cravings, ADHD, Benzos, Anti-depressants, and gene testing for effectiveness.    Summary: Pt stated she had a positive weekend and felt much more relaxed and present since having her positive doctors appointment last week concerning her possible liver transplant. She reported she attended 1 AA meeting over the weekend and enjoyed it. She did not identify any "speedbumps" and reported increased interpersonal effectiveness with her husband who is "more supportive than he initially was". Pt appeared physically more alert and able-bodied. She stated her PT/OT continues to  "push her but it is helping her make good progress". She continues to deny cravings or thoughts of using weed or alcohol.    UDS collected: No Results: negative  AA/NA attended?: YesSaturday  Sponsor?: No   Wes Swan, LPCA LCASA 03/10/2017 9:35 AM

## 2017-03-11 ENCOUNTER — Telehealth: Payer: Self-pay | Admitting: Family Medicine

## 2017-03-11 ENCOUNTER — Encounter (HOSPITAL_COMMUNITY): Payer: Self-pay | Admitting: Psychiatry

## 2017-03-11 NOTE — Progress Notes (Signed)
    Daily Group Progress Note  Program: CD-IOP   03/11/2017 [name] [id number]  Diagnosis:  Alcohol use disorder, severe, dependence ([place])  Cannabis abuse, episodic use   Sobriety Date: 5/4  Group Time: 1-2:30  Participation Level: Active  Behavioral Response: Appropriate and Sharing  Type of Therapy: Process Group  Interventions: CBT, Strength-based and Supportive  Topic: Patients were active and engaged for 1.5 hr group process session. Patients shared about their successes and struggles in early recovery. UDS were collected from some members. One new member was present today for her first group and shared openly.      Group Time: 2:30-4  Participation Level: Active  Behavioral Response: Appropriate and Sharing  Type of Therapy: Psycho-education Group  Interventions: Other: Art Therapy  Topic: Patients were active and engaged for 1.5 hour group psychoeducation session. Counselor led art therapy exercise in which pts drew images of themselves in past, present, and future. Counselor led process session about drawings and asked pts to share their drawings w/ each member. Patients stated they enjoyed the exercise and felt it was therapeutic.    Summary: Patient was active and engaged in session. She was dissapointed in herself to report she had not been to any meetings but she was happy to be feeling much better and more hopeful than last week. She asked for a schedule of AA meetings and another group member gave it to her. Pt stated her physical therapy is going well and she is walking on her own more and feeling stronger. Pt participated openly in art therapy session and shared openly. She cont to deny thoughts or cravings to use and states he relationship with her husband cont to go well.    UDS collected: No Results: negative  AA/NA attended?: No  Sponsor?: No   Wes Swan, LPCA LCASA 03/11/2017 1:49 PM

## 2017-03-11 NOTE — Telephone Encounter (Signed)
° °  Frankie with [facility] call to ask for verbal orders for home health pt  2 times a week for 2 weeks   [phone]

## 2017-03-11 NOTE — Telephone Encounter (Signed)
Verbal okay? 

## 2017-03-11 NOTE — Telephone Encounter (Signed)
Ok to authorized [facility] request. Thanks, BJ

## 2017-03-14 ENCOUNTER — Other Ambulatory Visit (INDEPENDENT_AMBULATORY_CARE_PROVIDER_SITE_OTHER): Payer: Managed Care, Other (non HMO)

## 2017-03-14 ENCOUNTER — Encounter (HOSPITAL_COMMUNITY): Payer: Self-pay | Admitting: Medical

## 2017-03-14 ENCOUNTER — Other Ambulatory Visit (HOSPITAL_COMMUNITY): Payer: 59 | Admitting: Psychology

## 2017-03-14 ENCOUNTER — Encounter: Payer: Self-pay | Admitting: Psychiatry

## 2017-03-14 DIAGNOSIS — Z6372 Alcoholism and drug addiction in family: Secondary | ICD-10-CM

## 2017-03-14 DIAGNOSIS — D539 Nutritional anemia, unspecified: Secondary | ICD-10-CM | POA: Diagnosis not present

## 2017-03-14 DIAGNOSIS — F1210 Cannabis abuse, uncomplicated: Secondary | ICD-10-CM

## 2017-03-14 DIAGNOSIS — F1020 Alcohol dependence, uncomplicated: Secondary | ICD-10-CM | POA: Diagnosis not present

## 2017-03-14 DIAGNOSIS — K7031 Alcoholic cirrhosis of liver with ascites: Secondary | ICD-10-CM

## 2017-03-14 DIAGNOSIS — E440 Moderate protein-calorie malnutrition: Secondary | ICD-10-CM

## 2017-03-14 DIAGNOSIS — F1421 Cocaine dependence, in remission: Secondary | ICD-10-CM

## 2017-03-14 DIAGNOSIS — Z87828 Personal history of other (healed) physical injury and trauma: Secondary | ICD-10-CM

## 2017-03-14 LAB — COMPREHENSIVE METABOLIC PANEL
ALT: 49 U/L — ABNORMAL HIGH (ref 0–35)
AST: 51 U/L — ABNORMAL HIGH (ref 0–37)
Albumin: 3.4 g/dL — ABNORMAL LOW (ref 3.5–5.2)
Alkaline Phosphatase: 246 U/L — ABNORMAL HIGH (ref 39–117)
BUN: 13 mg/dL (ref 6–23)
CHLORIDE: 96 meq/L (ref 96–112)
CO2: 28 meq/L (ref 19–32)
Calcium: 9.3 mg/dL (ref 8.4–10.5)
Creatinine, Ser: 0.58 mg/dL (ref 0.40–1.20)
GFR: 114.78 mL/min (ref >60.00)
GLUCOSE: 143 mg/dL — AB (ref 70–99)
POTASSIUM: 4.3 meq/L (ref 3.5–5.1)
SODIUM: 133 meq/L — AB (ref 135–145)
Total Bilirubin: 3 mg/dL — ABNORMAL HIGH (ref 0.2–1.2)
Total Protein: 6.3 g/dL (ref 6.0–8.3)

## 2017-03-14 LAB — CBC
HEMATOCRIT: 34.1 % — AB (ref 36.0–46.0)
HEMOGLOBIN: 11.3 g/dL — AB (ref 12.0–15.0)
MCHC: 33.2 g/dL (ref 30.0–36.0)
MCV: 109.1 fl — ABNORMAL HIGH (ref 78.0–100.0)
Platelets: 173 10*3/uL (ref 150.0–400.0)
RBC: 3.13 Mil/uL — ABNORMAL LOW (ref 3.87–5.11)
RDW: 15.1 % (ref 11.5–15.5)
WBC: 15.7 10*3/uL — ABNORMAL HIGH (ref 4.0–10.5)

## 2017-03-14 NOTE — Progress Notes (Signed)
S- FU visit to check on her status and visit with Liver transplant team in [place].No notes in Epic just record of visit. Says she is doing much better. Ha shed the walker. Her Transplant visit went well in terms of her recovery effort but nutritional deficits were addressed. She is eating nutritiously now, Says she is FU in [place] with transplant nurse.Had blood drawn this am.  O- Still quite thin but now ambulatory without walker.Skin still shows bruising Labs continue to improve Mental Status Examination   Appearance: Casually dressed Alert: Yes Attention: Good  Cooperative: Yes Eye Contact: Good Speech: normal in volume, rate, tone, spontaneous Psychomotor Activity: Normal Memory/Concentration: OK Oriented: person, place and situation Mood: Euthymic/upbeat Affect: Congruent and Full Range Thought Processes and Associations: Goal Directed and Intact/No desire for alcohol Fund of Knowledge: Good Thought Content: NO Suicidal ideation, Homicidal ideation, Auditory hallucinations, Visual hallucinations, Delusions and Paranoia,  Insight: Improving Judgement: Improving Language: good  LABS Results for Mercedes Mitchell, Mercedes Mitchell (MRN [id number]) as of 03/14/2017 14:34  Ref. Range 02/18/2017 19:47 02/19/2017 04:50  COMPREHENSIVE METABOLIC PANEL Unknown  Rpt (A)  Sodium Latest Ref Range: 135 - 145 mmol/L  135  Potassium Latest Ref Range: 3.5 - 5.1 mmol/L  3.9  Chloride Latest Ref Range: 101 - 111 mmol/L  98 (L)  CO2 Latest Ref Range: 22 - 32 mmol/L  27  Glucose Latest Ref Range: 65 - 99 mg/dL  84  BUN Latest Ref Range: 6 - 20 mg/dL  17  Creatinine Latest Ref Range: 0.44 - 1.00 mg/dL  0.47  Calcium Latest Ref Range: 8.9 - 10.3 mg/dL  8.2 (L)  Anion gap Latest Ref Range: 5 - 15   10  Phosphorus Latest Ref Range: 2.5 - 4.6 mg/dL  4.3  Magnesium Latest Ref Range: 1.7 - 2.4 mg/dL  2.0  Alkaline Phosphatase Latest Ref Range: 38 - 126 U/L  417 (H)  Albumin Latest Ref Range: 3.5 - 5.0 g/dL  2.3 (L)  AST  Latest Ref Range: 15 - 41 U/L  68 (H)  ALT Latest Ref Range: 14 - 54 U/L  66 (H)  Total Protein Latest Ref Range: 6.5 - 8.1 g/dL  4.9 (L)  Total Bilirubin Latest Ref Range: 0.3 - 1.2 mg/dL  5.5 (H)  PREALBUMIN Latest Ref Range: 18 - 38 mg/dL  11.7 (L)  GFR, Est African American Latest Ref Range: >60 mL/min  >60  GFR, Est Non African American Latest Ref Range: >60 mL/min  >60  WBC Latest Ref Range: 4.0 - 10.5 K/uL  33.4 (H)  RBC Latest Ref Range: 3.87 - 5.11 MIL/uL  2.39 (L)  Hemoglobin Latest Ref Range: 12.0 - 15.0 g/dL  8.8 (L)  HCT Latest Ref Range: 36.0 - 46.0 %  25.1 (L)  MCV Latest Ref Range: 78.0 - 100.0 fL  105.0 (H)  MCH Latest Ref Range: 26.0 - 34.0 pg  36.8 (H)  MCHC Latest Ref Range: 30.0 - 36.0 g/dL  35.1  RDW Latest Ref Range: 11.5 - 15.5 %  17.2 (H)  Platelets Latest Ref Range: 150 - 400 K/uL  162   A- Steady improvement  P-Continue CD IOP     Continue LFT Monitoring per Dr Zollie Scale MD and  NP [name]

## 2017-03-14 NOTE — Telephone Encounter (Signed)
Left voicemail for Mercedes Mitchell letting her know verbal orders are okay.

## 2017-03-15 ENCOUNTER — Encounter (HOSPITAL_COMMUNITY): Payer: Self-pay | Admitting: Psychiatry

## 2017-03-15 NOTE — Progress Notes (Signed)
    Daily Group Progress Note  Program: CD-IOP   03/15/2017 [name] [id number]  Diagnosis:  Alcohol use disorder, severe, dependence ([place])  Cannabis abuse, episodic use   Sobriety Date: 5/4  Group Time: 1-2:30  Participation Level: Active  Behavioral Response: Appropriate and Sharing  Type of Therapy: Process Group  Interventions: CBT, Strength-based and Supportive  Topic: Patients were active and engaged for 1.5 hr group process session. Patients shared about their successes and struggles in early recovery. Pts were instructed to discuss topical 12 step topics that relate to their recovery. Pts were invited to share about their homework assignment from last session.     Group Time: 2:30-4  Participation Level: Active  Behavioral Response: Appropriate and Sharing  Type of Therapy: Psycho-education Group  Interventions: Other: Guest Speaker, 12 Step meetings  Topic: Patients were active and engaged for 1.5 hour group psychoeducation session. A guest speaker shared about his journey in recovery from cocaine. One pt graduated successfully and received feedback and supportive goodbyes from group members. Patients stated they enjoyed the exercise and felt it was therapeutic.    Summary: Patient was active and engaged in session. She reported that she had not been to any AA meetings since yesterday. Pt became tearful when processing her current mood and stated she "was tired of doctors appointments". She reported she was seeing around 2 doctors/day everyday which is causing her anxiety and stress from the constant travel and information overload. The group was responsive and tried to comfort her by validating her tears and encouraging her. Pt listened actively to guest speaker and provided helpful feedback to graduating member.    UDS collected: No Results: negative  AA/NA attended?: No  Sponsor?: No   Wes Ancil Dewan, LPCA LCASA 03/15/2017 9:25 AM
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
## 2017-03-16 ENCOUNTER — Other Ambulatory Visit (HOSPITAL_COMMUNITY): Payer: 59 | Admitting: Psychology

## 2017-03-16 ENCOUNTER — Telehealth: Payer: Self-pay

## 2017-03-16 DIAGNOSIS — F1421 Cocaine dependence, in remission: Secondary | ICD-10-CM

## 2017-03-16 DIAGNOSIS — K7031 Alcoholic cirrhosis of liver with ascites: Secondary | ICD-10-CM

## 2017-03-16 DIAGNOSIS — F1020 Alcohol dependence, uncomplicated: Secondary | ICD-10-CM | POA: Diagnosis not present

## 2017-03-16 DIAGNOSIS — F1210 Cannabis abuse, uncomplicated: Secondary | ICD-10-CM

## 2017-03-16 MED ORDER — ENOXAPARIN SODIUM 60 MG/0.6ML ~~LOC~~ SOLN: 1.0000 mg/kg | SUBCUTANEOUS | 0 refills | Status: DC

## 2017-03-16 NOTE — Telephone Encounter (Signed)
Refill for 2 weeks until Dr Martinique back.

## 2017-03-16 NOTE — Telephone Encounter (Signed)
Patient called to request refill on Lovenox. She states that she is out of medication as of yesterday. Per chart notes at [place] with Dr. Martinique she was to continue this medication for 3 months, however the d/c rx has no refills.  Dr. Martinique is not in the office.  Dr. Elease Hashimoto - Please advise if ok to refill. Thanks!

## 2017-03-16 NOTE — Telephone Encounter (Signed)
Rx sent in for 2 weeks. Left message for pt to make her aware that Dr. Martinique will review when she returns to office.   Dr. Martinique - Please advise on further refills. Thanks!

## 2017-03-17 ENCOUNTER — Other Ambulatory Visit (HOSPITAL_COMMUNITY): Payer: 59 | Admitting: Psychology

## 2017-03-17 ENCOUNTER — Encounter (HOSPITAL_COMMUNITY): Payer: Self-pay | Admitting: Psychology

## 2017-03-17 DIAGNOSIS — F1020 Alcohol dependence, uncomplicated: Secondary | ICD-10-CM | POA: Diagnosis not present

## 2017-03-17 DIAGNOSIS — L89153 Pressure ulcer of sacral region, stage 3: Secondary | ICD-10-CM | POA: Diagnosis not present
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
## 2017-03-18 ENCOUNTER — Encounter (HOSPITAL_COMMUNITY): Payer: Self-pay | Admitting: Psychology
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
## 2017-03-21 ENCOUNTER — Encounter (HOSPITAL_COMMUNITY): Payer: Self-pay | Admitting: Psychology

## 2017-03-21 ENCOUNTER — Other Ambulatory Visit: Payer: Self-pay | Admitting: Family Medicine

## 2017-03-21 ENCOUNTER — Other Ambulatory Visit (HOSPITAL_COMMUNITY): Payer: 59 | Admitting: Psychology

## 2017-03-21 DIAGNOSIS — F1210 Cannabis abuse, uncomplicated: Secondary | ICD-10-CM

## 2017-03-21 DIAGNOSIS — I82411 Acute embolism and thrombosis of right femoral vein: Secondary | ICD-10-CM

## 2017-03-21 DIAGNOSIS — F1421 Cocaine dependence, in remission: Secondary | ICD-10-CM

## 2017-03-21 DIAGNOSIS — F1020 Alcohol dependence, uncomplicated: Secondary | ICD-10-CM | POA: Diagnosis not present

## 2017-03-21 DIAGNOSIS — K7031 Alcoholic cirrhosis of liver with ascites: Secondary | ICD-10-CM

## 2017-03-21 MED ORDER — ENOXAPARIN SODIUM 60 MG/0.6ML ~~LOC~~ SOLN: 1.0000 mg/kg | SUBCUTANEOUS | 1 refills | Status: DC

## 2017-03-21 NOTE — Telephone Encounter (Signed)
Rx for Lovenox sent to her pharmacy.  Thanks, BJ

## 2017-03-22 ENCOUNTER — Encounter (HOSPITAL_COMMUNITY): Payer: Self-pay | Admitting: Psychology

## 2017-03-22 ENCOUNTER — Telehealth: Payer: Self-pay | Admitting: Family Medicine

## 2017-03-22 DIAGNOSIS — R2681 Unsteadiness on feet: Secondary | ICD-10-CM

## 2017-03-22 NOTE — Telephone Encounter (Signed)
Pt has been D/C from home health and recommend PTNOT would like to have pt referred to [facility] [place] their # is [phone].

## 2017-03-22 NOTE — Telephone Encounter (Signed)
Referral placed.

## 2017-03-22 NOTE — Telephone Encounter (Signed)
It is OK to place PT referral as requested. Thanks, BJ

## 2017-03-22 NOTE — Telephone Encounter (Signed)
Okay to order PT
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
## 2017-03-23 ENCOUNTER — Other Ambulatory Visit (HOSPITAL_COMMUNITY): Payer: 59 | Attending: Psychiatry | Admitting: Psychology

## 2017-03-23 DIAGNOSIS — F1020 Alcohol dependence, uncomplicated: Secondary | ICD-10-CM | POA: Insufficient documentation

## 2017-03-24 ENCOUNTER — Encounter: Payer: Managed Care, Other (non HMO) | Admitting: Internal Medicine

## 2017-03-24 ENCOUNTER — Encounter (HOSPITAL_BASED_OUTPATIENT_CLINIC_OR_DEPARTMENT_OTHER): Payer: Managed Care, Other (non HMO) | Attending: Internal Medicine

## 2017-03-24 ENCOUNTER — Other Ambulatory Visit (HOSPITAL_COMMUNITY): Payer: 59 | Admitting: Psychology

## 2017-03-24 ENCOUNTER — Encounter (HOSPITAL_COMMUNITY): Payer: Self-pay | Admitting: Psychology

## 2017-03-24 DIAGNOSIS — F1020 Alcohol dependence, uncomplicated: Secondary | ICD-10-CM

## 2017-03-24 DIAGNOSIS — E43 Unspecified severe protein-calorie malnutrition: Secondary | ICD-10-CM | POA: Diagnosis not present

## 2017-03-24 DIAGNOSIS — K7031 Alcoholic cirrhosis of liver with ascites: Secondary | ICD-10-CM | POA: Diagnosis not present

## 2017-03-24 DIAGNOSIS — Z872 Personal history of diseases of the skin and subcutaneous tissue: Secondary | ICD-10-CM | POA: Diagnosis not present

## 2017-03-24 DIAGNOSIS — Z09 Encounter for follow-up examination after completed treatment for conditions other than malignant neoplasm: Secondary | ICD-10-CM | POA: Diagnosis present

## 2017-03-24 DIAGNOSIS — F1210 Cannabis abuse, uncomplicated: Secondary | ICD-10-CM

## 2017-03-24 NOTE — Progress Notes (Signed)
    Daily Group Progress Note  Program: CD-IOP   03/24/2017 [name] [id number]  Diagnosis:  Alcohol use disorder, severe, dependence ([place])   Sobriety Date: 5/4  Group Time: 1-2:30  Participation Level: Active  Behavioral Response: Appropriate and Sharing  Type of Therapy: Process Group  Interventions: CBT, Solution Focused, Strength-based and Supportive  Topic: Patients were active and engaged in group process session. Members shared about their experience since last group and challenges and successes they had. Some UDS were collected from members. One new member was present and shared briefly about this experience w/ drug addiction and past tx. This new member met with [name] to establish care. Counselors administered PHQ9 and GAD7 to all pts.     Group Time: 2:30-4  Participation Level: Active  Behavioral Response: Appropriate and Sharing  Type of Therapy: Psycho-education Group  Interventions: CBT  Topic: Patients were active and engaged in group psychoeducation session. Counselors led members in a lesson on core beliefs and cognitive distortion in early recovery. CBT strategies were taught and modeled to help w/ pts coping skills.   Summary: Pt was more upbeat and calm versus last session. She reported that she went to the funeral for her friend and the afterparty was, as expected, full of drinking and partying. She was happy to report that she and her husband only stayed for a few minutes and did not let the alcohol get to them. Pt reported that her husband is worried about her spending habits since she has been doing some long term shopping sprees lately. Pt reported the amount of money she is spending is not much and that she only buys things they need. Pt identified her cognitive distortion as "I don't deserve a second chance, given my hx".    UDS collected: No Results: negative  AA/NA attended?: YesWednesday  Sponsor?: No   Youlanda Roys, LPCA  LCASA 03/24/2017 5:12 PM

## 2017-03-25 ENCOUNTER — Ambulatory Visit
Admission: RE | Admit: 2017-03-25 | Discharge: 2017-03-25 | Disposition: A | Discharge status: Home / Self Care | Payer: Managed Care, Other (non HMO) | Source: Ambulatory Visit | Attending: Family Medicine | Admitting: Family Medicine

## 2017-03-25 ENCOUNTER — Ambulatory Visit: Payer: Managed Care, Other (non HMO) | Attending: Family Medicine | Admitting: Physical Therapy

## 2017-03-25 ENCOUNTER — Encounter: Payer: Self-pay | Admitting: Physical Therapy

## 2017-03-25 DIAGNOSIS — R293 Abnormal posture: Secondary | ICD-10-CM | POA: Insufficient documentation

## 2017-03-25 DIAGNOSIS — M6281 Muscle weakness (generalized): Secondary | ICD-10-CM | POA: Diagnosis present

## 2017-03-25 DIAGNOSIS — Z1239 Encounter for other screening for malignant neoplasm of breast: Secondary | ICD-10-CM

## 2017-03-25 DIAGNOSIS — R262 Difficulty in walking, not elsewhere classified: Secondary | ICD-10-CM | POA: Diagnosis present

## 2017-03-25 NOTE — Therapy (Signed)
[facility] [address] [place], [place], [address] Phone: [phone]   Fax:  [phone]  Physical Therapy Evaluation  Patient Details  Name: Mercedes Mitchell MRN: [id number] Date of Birth: 1960/10/13 Referring Provider: Martinique, Betty G, MD  Encounter Date: 03/25/2017      PT End of Session - 03/25/17 0811    Visit Number 1   Date for PT Re-Evaluation 05/20/17   PT Start Time 0801   PT Stop Time 0839   PT Time Calculation (min) 38 min   Activity Tolerance Patient tolerated treatment well      Past Medical History:  Diagnosis Date  . 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  paracentesis 2.6 liters 5/4: no SBP.    Marland Kitchen Alcoholism ([place]) 12/2016  . Candida esophagitis ([place])   . Cirrhosis ([place])   . DVT (deep venous thrombosis) ([place])   . Dysphagia 2011  . Esophagitis   . Hepatic steatosis   . Hiatal hernia   . Hiatal hernia   . Macrocytic anemia 12/2016  . Malnutrition ([place]) 12/2016  . Panic type anxiety neurosis 2011  . Portal hypertension ([place])   . Portal hypertensive gastropathy [facility])     Past Surgical History:  Procedure Laterality Date  . ESOPHAGOGASTRODUODENOSCOPY N/A 12/25/2016   Procedure: ESOPHAGOGASTRODUODENOSCOPY (EGD);  Surgeon: Jerene Bears, MD;  Location: [facility];  Service: Endoscopy;  Laterality: N/A;  . IR PARACENTESIS  02/02/2017  . IR TRANSCATHETER BX  02/02/2017  . IR US GUIDE VASC ACCESS RIGHT  02/02/2017  . IR VENOGRAM HEPATIC W HEMODYNAMIC EVALUATION  02/02/2017    There were no vitals filed for this visit.       Subjective Assessment - 03/25/17 0804    Subjective I am working no gait and stability.  I had home health PT for a while.  I have been taking it easy and going.  I have a rollator and but haven't been using.  I just go slow.     Pertinent History TMPT   Limitations Walking   How long can you walk  comfortably? 20 minutes   Patient Stated Goals continue getting stronger and work on upper body strength, be able to get up off the floor   Currently in Pain? No/denies            [facility] PT Assessment - 03/25/17 0001      Assessment   Medical Diagnosis R26.81 (ICD-10-CM) - Unstable gait   Referring Provider Martinique, Betty G, MD     Balance Screen   Has the patient fallen in the past 6 months Yes   How many times? 1     ROM / Strength   AROM / PROM / Strength Strength     Strength   Overall Strength Comments Rt 4/5; Lt 4-/5   Strength Assessment Site Shoulder   Right/Left Shoulder Right;Left     Ambulation/Gait   Gait Pattern Wide base of support;Decreased step length - right;Decreased step length - left;Decreased stance time - right;Decreased stance time - left;Decreased stride length     Standardized Balance Assessment   Five times sit to stand comments  18     Berg Balance Test   Sit to Stand Able to stand  independently using hands   Standing Unsupported Able to stand safely 2 minutes   Sitting with Back Unsupported but Feet Supported on Floor or Stool Able  to sit safely and securely 2 minutes   Stand to Sit Sits safely with minimal use of hands   Transfers Able to transfer safely, definite need of hands   Standing Unsupported with Eyes Closed Able to stand 10 seconds safely   Standing Ubsupported with Feet Together Able to place feet together independently and stand 1 minute safely   From Standing, Reach Forward with Outstretched Arm Can reach forward >5 cm safely (2")   From Standing Position, Pick up Object from Floor Able to pick up shoe, needs supervision   From Standing Position, Turn to Look Behind Over each Shoulder Looks behind from both sides and weight shifts well   Turn 360 Degrees Able to turn 360 degrees safely one side only in 4 seconds or less   Standing Unsupported, Alternately Place Feet on Step/Stool Able to stand independently and complete 8 steps >20  seconds   Standing Unsupported, One Foot in Front Needs help to step but can hold 15 seconds   Standing on One Leg Tries to lift leg/unable to hold 3 seconds but remains standing independently   Total Score 43     Dynamic Gait Index   Level Surface Mild Impairment   Change in Gait Speed Moderate Impairment   Gait with Horizontal Head Turns Mild Impairment   Gait with Vertical Head Turns Mild Impairment   Gait and Pivot Turn Mild Impairment     Timed Up and Go Test   TUG Normal TUG   Normal TUG (seconds) 18  average of 3 attempts, use UE for sit/stand            Objective measurements completed on examination: See above findings.                    PT Short Term Goals - 03/25/17 0844      PT SHORT TERM GOAL #1   Title independent with initial HEP   Time 4   Period Weeks   Status New   Target Date 04/22/17     PT SHORT TERM GOAL #2   Title pt reports feeling 25% more steady when walking in the community   Time 4   Period Weeks   Status New   Target Date 04/22/17           PT Long Term Goals - 03/25/17 0812      PT LONG TERM GOAL #1   Title able to get up and down from the floor easier   Time 8   Period Weeks   Status New   Target Date 05/20/17     PT LONG TERM GOAL #2   Title independent with advanced HEP   Time 8   Period Weeks   Status New   Target Date 05/20/17     PT LONG TERM GOAL #3   Title TUG < or = to 12 sec   Time 8   Period Weeks   Status New   Target Date 05/20/17     PT LONG TERM GOAL #4   Title Berg > or = to 49/56   Baseline 43/56   Time 8   Period Weeks   Status New   Target Date 05/20/17     PT LONG TERM GOAL #5   Title able to demonstrate greater variation in gait speed based on DGI, due to improved stability    Time 8   Period Weeks   Status New   Target Date 05/20/17  Plan - 03/25/17 0846    Clinical Impression Statement Patient presents to clinic due to gait instability and  difficulty walking.  Patient was in the hospital for an extended period of time due to complications from alcoholism.  Pt is in recovery and has had HHPT.  She has made progress to where she can now drive and ambulates without any AD.  She demonstrates unsteady gait and overall UE/LE weakness as demonstrated by Merrilee Jansky, TUG and 5 x sit to stand tests.  Pt will benefit from skilled PT to work on functional strength and balance so she can safely peform functional activities and walking in community.   History and Personal Factors relevant to plan of care: alcoholic   Clinical Presentation Stable   Clinical Presentation due to: pt is stable   Clinical Decision Making Low   Rehab Potential Good   PT Frequency 2x / week   PT Duration 8 weeks   PT Treatment/Interventions ADLs/Self Care Home Management;Cryotherapy;Electrical Stimulation;Moist Heat;Therapeutic activities;Therapeutic exercise;Balance training;Neuromuscular re-education;Gait training;Stair training;Manual techniques;Patient/family education;Taping;Dry needling;Passive range of motion;Functional mobility training   PT Next Visit Plan overall strength UE and LE, gait, stairs, balance   Consulted and Agree with Plan of Care Patient      Patient will benefit from skilled therapeutic intervention in order to improve the following deficits and impairments:  Abnormal gait, Difficulty walking, Decreased strength, Postural dysfunction, Decreased coordination, Decreased balance  Visit Diagnosis: Difficulty in walking, not elsewhere classified  Muscle weakness (generalized)  Abnormal posture     Problem List Patient Active Problem List   Diagnosis Date Noted  . Sacral decubitus ulcer, stage III ([place]) 02/18/2017  . Abdominal pain 02/10/2017  . Acute deep vein thrombosis (DVT) of right femoral vein ([place]) 02/10/2017  . Hyponatremia 02/10/2017  . DVT (deep venous thrombosis) ([place]) 02/10/2017  . Alcoholic cirrhosis of liver with ascites ([place])  01/12/2017  . GI bleeding 01/12/2017  . Current every day smoker 01/12/2017  . Ascites   . Coffee ground emesis   . Acute esophagitis   . Protein-calorie malnutrition, severe 12/24/2016  . Acute alcoholic hepatitis 54/04/8118  . ETOH abuse 12/23/2016  . Macrocytic anemia 12/21/2016  . 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 DYSPHAGIA 03/25/2010    Zannie Cove, PT 03/25/2017, 11:10 AM   [facility] [address] [place], [place], [address] Phone: [phone]   Fax:  [phone]  Name: Adwoa Axe MRN: [id number] Date of Birth: 12/12/1960

## 2017-03-26 ENCOUNTER — Other Ambulatory Visit: Payer: Self-pay

## 2017-03-26 ENCOUNTER — Inpatient Hospital Stay (HOSPITAL_COMMUNITY)
Admission: EM | Admit: 2017-03-26 | Discharge: 2017-03-28 | DRG: 193 | Disposition: A | Discharge status: Home / Self Care | Payer: Managed Care, Other (non HMO) | Attending: Internal Medicine | Admitting: Internal Medicine

## 2017-03-26 ENCOUNTER — Emergency Department (HOSPITAL_COMMUNITY): Payer: Managed Care, Other (non HMO)

## 2017-03-26 ENCOUNTER — Encounter (HOSPITAL_COMMUNITY): Payer: Self-pay

## 2017-03-26 ENCOUNTER — Other Ambulatory Visit (HOSPITAL_COMMUNITY): Payer: Self-pay

## 2017-03-26 DIAGNOSIS — I82401 Acute embolism and thrombosis of unspecified deep veins of right lower extremity: Secondary | ICD-10-CM | POA: Diagnosis present

## 2017-03-26 DIAGNOSIS — I82409 Acute embolism and thrombosis of unspecified deep veins of unspecified lower extremity: Secondary | ICD-10-CM | POA: Diagnosis present

## 2017-03-26 DIAGNOSIS — K7031 Alcoholic cirrhosis of liver with ascites: Secondary | ICD-10-CM | POA: Diagnosis not present

## 2017-03-26 DIAGNOSIS — R000 Tachycardia, unspecified: Secondary | ICD-10-CM | POA: Diagnosis present

## 2017-03-26 DIAGNOSIS — Z8349 Family history of other endocrine, nutritional and metabolic diseases: Secondary | ICD-10-CM | POA: Diagnosis not present

## 2017-03-26 DIAGNOSIS — E46 Unspecified protein-calorie malnutrition: Secondary | ICD-10-CM | POA: Diagnosis present

## 2017-03-26 DIAGNOSIS — I82411 Acute embolism and thrombosis of right femoral vein: Secondary | ICD-10-CM | POA: Diagnosis not present

## 2017-03-26 DIAGNOSIS — Z8249 Family history of ischemic heart disease and other diseases of the circulatory system: Secondary | ICD-10-CM

## 2017-03-26 DIAGNOSIS — Z681 Body mass index (BMI) 19 or less, adult: Secondary | ICD-10-CM

## 2017-03-26 DIAGNOSIS — Z9889 Other specified postprocedural states: Secondary | ICD-10-CM | POA: Diagnosis not present

## 2017-03-26 DIAGNOSIS — L89153 Pressure ulcer of sacral region, stage 3: Secondary | ICD-10-CM | POA: Diagnosis not present

## 2017-03-26 DIAGNOSIS — Z87891 Personal history of nicotine dependence: Secondary | ICD-10-CM

## 2017-03-26 DIAGNOSIS — G9341 Metabolic encephalopathy: Secondary | ICD-10-CM | POA: Diagnosis present

## 2017-03-26 DIAGNOSIS — J181 Lobar pneumonia, unspecified organism: Secondary | ICD-10-CM | POA: Diagnosis not present

## 2017-03-26 DIAGNOSIS — Z7682 Awaiting organ transplant status: Secondary | ICD-10-CM | POA: Diagnosis not present

## 2017-03-26 DIAGNOSIS — Z7952 Long term (current) use of systemic steroids: Secondary | ICD-10-CM | POA: Diagnosis not present

## 2017-03-26 DIAGNOSIS — K7040 Alcoholic hepatic failure without coma: Secondary | ICD-10-CM | POA: Diagnosis present

## 2017-03-26 DIAGNOSIS — B001 Herpesviral vesicular dermatitis: Secondary | ICD-10-CM | POA: Diagnosis present

## 2017-03-26 DIAGNOSIS — Z79899 Other long term (current) drug therapy: Secondary | ICD-10-CM

## 2017-03-26 DIAGNOSIS — J189 Pneumonia, unspecified organism: Secondary | ICD-10-CM | POA: Diagnosis not present

## 2017-03-26 DIAGNOSIS — E43 Unspecified severe protein-calorie malnutrition: Secondary | ICD-10-CM | POA: Diagnosis not present

## 2017-03-26 DIAGNOSIS — K766 Portal hypertension: Secondary | ICD-10-CM | POA: Diagnosis present

## 2017-03-26 DIAGNOSIS — E871 Hypo-osmolality and hyponatremia: Secondary | ICD-10-CM | POA: Diagnosis present

## 2017-03-26 DIAGNOSIS — Y95 Nosocomial condition: Secondary | ICD-10-CM | POA: Diagnosis present

## 2017-03-26 DIAGNOSIS — B370 Candidal stomatitis: Secondary | ICD-10-CM | POA: Diagnosis present

## 2017-03-26 DIAGNOSIS — A419 Sepsis, unspecified organism: Secondary | ICD-10-CM | POA: Diagnosis not present

## 2017-03-26 DIAGNOSIS — Z811 Family history of alcohol abuse and dependence: Secondary | ICD-10-CM | POA: Diagnosis not present

## 2017-03-26 LAB — AMMONIA: Ammonia: 104 umol/L — ABNORMAL HIGH (ref 9–35)

## 2017-03-26 LAB — COMPREHENSIVE METABOLIC PANEL
ALBUMIN: 3.2 g/dL — AB (ref 3.5–5.0)
ALK PHOS: 178 U/L — AB (ref 38–126)
ALT: 47 U/L (ref 14–54)
ANION GAP: 11 (ref 5–15)
AST: 64 U/L — AB (ref 15–41)
BILIRUBIN TOTAL: 2.8 mg/dL — AB (ref 0.3–1.2)
BUN: 15 mg/dL (ref 6–20)
CALCIUM: 9.3 mg/dL (ref 8.9–10.3)
CO2: 24 mmol/L (ref 22–32)
Chloride: 98 mmol/L — ABNORMAL LOW (ref 101–111)
Creatinine, Ser: 0.77 mg/dL (ref 0.44–1.00)
GFR calc Af Amer: >60 mL/min (ref >60)
GLUCOSE: 139 mg/dL — AB (ref 65–99)
POTASSIUM: 4.1 mmol/L (ref 3.5–5.1)
Sodium: 133 mmol/L — ABNORMAL LOW (ref 135–145)
TOTAL PROTEIN: 7.4 g/dL (ref 6.5–8.1)

## 2017-03-26 LAB — URINALYSIS, ROUTINE W REFLEX MICROSCOPIC
Bilirubin Urine: NEGATIVE
Glucose, UA: NEGATIVE mg/dL
Hgb urine dipstick: NEGATIVE
KETONES UR: NEGATIVE mg/dL
LEUKOCYTES UA: NEGATIVE
NITRITE: NEGATIVE
PROTEIN: NEGATIVE mg/dL
Specific Gravity, Urine: 1.004 — ABNORMAL LOW (ref 1.005–1.030)
pH: 8 (ref 5.0–8.0)

## 2017-03-26 LAB — CBC WITH DIFFERENTIAL/PLATELET
BASOS ABS: 0 10*3/uL (ref 0.0–0.1)
BASOS PCT: 0 %
EOS PCT: 0 %
Eosinophils Absolute: 0 10*3/uL (ref 0.0–0.7)
HCT: 39.5 % (ref 36.0–46.0)
Hemoglobin: 13.5 g/dL (ref 12.0–15.0)
Lymphocytes Relative: 4 %
Lymphs Abs: 0.6 10*3/uL — ABNORMAL LOW (ref 0.7–4.0)
MCH: 35.3 pg — ABNORMAL HIGH (ref 26.0–34.0)
MCHC: 34.2 g/dL (ref 30.0–36.0)
MCV: 103.4 fL — ABNORMAL HIGH (ref 78.0–100.0)
MONO ABS: 0.5 10*3/uL (ref 0.1–1.0)
Monocytes Relative: 3 %
Neutro Abs: 15.4 10*3/uL — ABNORMAL HIGH (ref 1.7–7.7)
Neutrophils Relative %: 93 %
PLATELETS: 228 10*3/uL (ref 150–400)
RBC: 3.82 MIL/uL — AB (ref 3.87–5.11)
RDW: 15 % (ref 11.5–15.5)
WBC: 16.6 10*3/uL — AB (ref 4.0–10.5)

## 2017-03-26 LAB — LACTIC ACID, PLASMA: Lactic Acid, Venous: 2 mmol/L (ref 0.5–1.9)

## 2017-03-26 LAB — CG4 I-STAT (LACTIC ACID): LACTIC ACID, VENOUS: 2.16 mmol/L — AB (ref 0.5–1.9)

## 2017-03-26 MED ORDER — DEXTROSE 5 % IV SOLN
500.0000 mg | Freq: Once | INTRAVENOUS | Status: AC
  Administered 2017-03-26: 500 mg via INTRAVENOUS
  Filled 2017-03-26: qty 500

## 2017-03-26 MED ORDER — TRAMADOL HCL 50 MG PO TABS
50.0000 mg | ORAL_TABLET | Freq: Once | ORAL | Status: AC
  Administered 2017-03-26: 50 mg via ORAL
  Filled 2017-03-26: qty 1

## 2017-03-26 MED ORDER — DEXTROSE 5 % IV SOLN
2.0000 g | INTRAVENOUS | Status: DC
  Filled 2017-03-26: qty 2

## 2017-03-26 MED ORDER — ENSURE ENLIVE PO LIQD
237.0000 mL | Freq: Three times a day (TID) | ORAL | Status: DC
  Administered 2017-03-27 – 2017-03-28 (×5): 237 mL via ORAL

## 2017-03-26 MED ORDER — ENOXAPARIN SODIUM 60 MG/0.6ML ~~LOC~~ SOLN
70.0000 mg | Freq: Every day | SUBCUTANEOUS | Status: DC
  Filled 2017-03-26: qty 1.2

## 2017-03-26 MED ORDER — VITAMIN B-1 100 MG PO TABS
100.0000 mg | ORAL_TABLET | Freq: Every day | ORAL | Status: DC
  Administered 2017-03-27 – 2017-03-28 (×2): 100 mg via ORAL
  Filled 2017-03-26 (×2): qty 1

## 2017-03-26 MED ORDER — FUROSEMIDE 40 MG PO TABS
40.0000 mg | ORAL_TABLET | Freq: Every day | ORAL | Status: DC
  Administered 2017-03-27: 40 mg via ORAL
  Filled 2017-03-26: qty 1

## 2017-03-26 MED ORDER — DEXTROSE 5 % IV SOLN
500.0000 mg | INTRAVENOUS | Status: DC
  Filled 2017-03-26: qty 500

## 2017-03-26 MED ORDER — ADULT MULTIVITAMIN W/MINERALS CH
1.0000 | ORAL_TABLET | Freq: Every day | ORAL | Status: DC
  Administered 2017-03-27 – 2017-03-28 (×2): 1 via ORAL
  Filled 2017-03-26 (×2): qty 1

## 2017-03-26 MED ORDER — SPIRONOLACTONE 100 MG PO TABS
100.0000 mg | ORAL_TABLET | Freq: Every day | ORAL | Status: DC
  Administered 2017-03-27: 100 mg via ORAL
  Filled 2017-03-26: qty 1
  Filled 2017-03-26: qty 4

## 2017-03-26 MED ORDER — ALBUTEROL SULFATE (2.5 MG/3ML) 0.083% IN NEBU
5.0000 mg | INHALATION_SOLUTION | Freq: Once | RESPIRATORY_TRACT | Status: AC
  Administered 2017-03-26: 5 mg via RESPIRATORY_TRACT

## 2017-03-26 MED ORDER — QUETIAPINE FUMARATE 25 MG PO TABS
25.0000 mg | ORAL_TABLET | Freq: Every evening | ORAL | Status: DC | PRN
  Administered 2017-03-27 (×2): 25 mg via ORAL
  Filled 2017-03-26: qty 2

## 2017-03-26 MED ORDER — SODIUM CHLORIDE 0.9 % IV SOLN
INTRAVENOUS | Status: AC
  Administered 2017-03-26: 22:00:00 via INTRAVENOUS

## 2017-03-26 MED ORDER — PANTOPRAZOLE SODIUM 40 MG PO TBEC
40.0000 mg | DELAYED_RELEASE_TABLET | Freq: Two times a day (BID) | ORAL | Status: DC
  Administered 2017-03-27 – 2017-03-28 (×3): 40 mg via ORAL
  Filled 2017-03-26 (×3): qty 1

## 2017-03-26 MED ORDER — LACTULOSE 10 GM/15ML PO SOLN
20.0000 g | Freq: Two times a day (BID) | ORAL | Status: DC
  Administered 2017-03-26 – 2017-03-27 (×2): 20 g via ORAL
  Filled 2017-03-26 (×2): qty 30

## 2017-03-26 MED ORDER — DEXTROSE 5 % IV SOLN
1.0000 g | Freq: Once | INTRAVENOUS | Status: AC
  Administered 2017-03-26: 1 g via INTRAVENOUS
  Filled 2017-03-26: qty 10

## 2017-03-26 MED ORDER — PREDNISOLONE 15 MG/5ML PO SOLN
40.0000 mg | Freq: Every day | ORAL | Status: DC
  Administered 2017-03-27 – 2017-03-28 (×2): 40 mg via ORAL
  Filled 2017-03-26 (×2): qty 15

## 2017-03-26 MED ORDER — SODIUM CHLORIDE 0.9 % IV BOLUS (SEPSIS)
1000.0000 mL | Freq: Once | INTRAVENOUS | Status: AC
  Administered 2017-03-26: 1000 mL via INTRAVENOUS

## 2017-03-26 MED ORDER — NYSTATIN 100000 UNIT/ML MT SUSP
5.0000 mL | Freq: Four times a day (QID) | OROMUCOSAL | Status: DC
  Administered 2017-03-26 – 2017-03-28 (×7): 500000 [IU] via ORAL
  Filled 2017-03-26 (×7): qty 5

## 2017-03-26 MED ORDER — FOLIC ACID 1 MG PO TABS
1.0000 mg | ORAL_TABLET | Freq: Every day | ORAL | Status: DC
  Administered 2017-03-27 – 2017-03-28 (×2): 1 mg via ORAL
  Filled 2017-03-26 (×2): qty 1

## 2017-03-26 MED ORDER — ENOXAPARIN SODIUM 80 MG/0.8ML ~~LOC~~ SOLN
70.0000 mg | Freq: Every day | SUBCUTANEOUS | Status: DC
  Administered 2017-03-26 – 2017-03-27 (×2): 70 mg via SUBCUTANEOUS
  Filled 2017-03-26 (×2): qty 0.8

## 2017-03-26 MED ORDER — ONDANSETRON HCL 4 MG PO TABS: 4.0000 mg | ORAL_TABLET | Freq: Three times a day (TID) | ORAL | Status: DC | PRN

## 2017-03-26 MED ORDER — DEXTROSE 5 % IV SOLN: 1.0000 g | INTRAVENOUS | Status: DC

## 2017-03-26 NOTE — ED Triage Notes (Signed)
Pt has cirrhosis of liver.  Started having fever and shortness of breath/not feeling well starting today.  No ETOH for 91 days

## 2017-03-26 NOTE — H&P (Addendum)
History and Physical    Mercedes Mitchell JWJ:[id number] DOB: November 22, 1960 DOA: 03/26/2017  PCP: Martinique, Betty G, MD  Patient coming from: Home  Chief Complaint: Confusion, fever.   HPI: Mercedes Mitchell is a 56 y.o. 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  Mercedes Mitchell cannot remember much of this morning, but her husband was there to help provide the history.  They note that this morning, Ms. Staff woke up very fatigued and could not get out of bed. She further was confused and "talking gibberish."  They checked her temperature and it was 102F.  She took some ibuprofen and slowly improved back to her baseline mental status.  Given the fever, she was brought to the ED.  She notes no cough, headache, neck pain, skin wounds or rash.  She has some abdominal swelling/ascites, but notes this has improved in the last few weeks.  She has been working to take care of herself and she is 91 days without any ETOH.  She has been taking her lactulose, normally having 3BM per day, but no BM so far today.  She has some very mild epigastric pain and some worsening distention after fluids in the ED.  She had a paracentesis about a month ago.  She has significant skin changes, but these are better per her.  She has had a recent wound on her bottom, but this is healed and she has been released from wound care.  She denies any new wounds to the skin.  She notes she may have some mild dysuria.    ED Course: In the ED, she was found to have a WBC of 16.6 (actually chronically elevated and she is on chronic steroids), Ammonia of 104 (up from 83 at last check), lactic acid of 2.16, Na of 133 (chronic), ALP of 178, AST of 64, ALT of 47, Tbili of 2.8 (down from 3).  CXR showed a possible patchy infiltrate on the right, possibly an early pneumonia.  UA did not show signs of infection.   Review of Systems: As per HPI  otherwise 10 point review of systems negative.   Past Medical History:  Diagnosis Date  . 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  paracentesis 2.6 liters 5/4: no SBP.    Marland Kitchen Alcoholism ([place]) 12/2016  . Candida esophagitis ([place])   . Cirrhosis ([place])   . DVT (deep venous thrombosis) ([place])   . Dysphagia 2011  . Esophagitis   . Hepatic steatosis   . Hiatal hernia   . Hiatal hernia   . Macrocytic anemia 12/2016  . Malnutrition ([place]) 12/2016  . Panic type anxiety neurosis 2011  . Portal hypertension ([place])   . Portal hypertensive gastropathy [facility])     Past Surgical History:  Procedure Laterality Date  . ESOPHAGOGASTRODUODENOSCOPY N/A 12/25/2016   Procedure: ESOPHAGOGASTRODUODENOSCOPY (EGD);  Surgeon: Jerene Bears, MD;  Location: [facility];  Service: Endoscopy;  Laterality: N/A;  . IR PARACENTESIS  02/02/2017  . IR TRANSCATHETER BX  02/02/2017  . IR US GUIDE VASC ACCESS RIGHT  02/02/2017  . IR VENOGRAM HEPATIC W HEMODYNAMIC EVALUATION  02/02/2017   Reviewed with patient.   reports that she has quit smoking. Her smoking use included Cigarettes. She has never used smokeless tobacco. She reports that she drinks alcohol. She reports that she does not use  drugs.  No Known Allergies  Reviewed with patient.  Family History  Problem Relation Age of Onset  . Hyperlipidemia Father   . Heart disease Father   . Alcoholism Father   . Heart attack Father   . Alcoholism Mother   . Alcoholism Sister      Prior to Admission medications   Medication Sig Start Date End Date Taking? Authorizing Provider  bacitracin ointment Apply 1 application topically 3 (three) times daily as needed for wound care. 02/19/17  Yes Hongalgi, Lenis Dickinson, MD  camphor-menthol [facility]) lotion Apply topically as needed for itching. 02/05/17  Yes Mikhail, Maryann, DO  enoxaparin (LOVENOX) 60 MG/0.6ML injection Inject 0.45 mLs (45 mg total) into the skin  daily. 03/21/17  Yes Martinique, Betty G, MD  feeding supplement, ENSURE ENLIVE, (ENSURE ENLIVE) LIQD Take 237 mLs by mouth 3 (three) times daily between meals. 12/28/16  Yes Mikhail, Velta Addison, DO  folic acid (FOLVITE) 1 MG tablet Take 1 tablet (1 mg total) by mouth daily. 02/16/17  Yes Dara Hoyer, PA-C  furosemide (LASIX) 20 MG tablet Take 4 tablets (80 mg total) by mouth daily. 02/19/17  Yes Hongalgi, Lenis Dickinson, MD  hydrocerin (EUCERIN) CREA Apply 1 application topically daily. Apply to bilateral legs once daily 03/07/17  Yes Martinique, Betty G, MD  lactulose (CHRONULAC) 10 GM/15ML solution Take 30 mLs (20 g total) by mouth 2 (two) times daily. 02/18/17  Yes Pyrtle, Lajuan Lines, MD  Multiple Vitamin (MULTIVITAMIN WITH MINERALS) TABS tablet Take 1 tablet by mouth daily. 12/29/16  Yes Mikhail, Velta Addison, DO  nystatin (MYCOSTATIN) 100000 UNIT/ML suspension TAKE 5ML BY MOUTH FOUR TIMES A DAY 03/01/17 03/31/17 Yes Martinique, Betty G, MD  pantoprazole (PROTONIX) 40 MG tablet Take 1 tablet (40 mg total) by mouth 2 (two) times daily. 02/18/17  Yes Pyrtle, Lajuan Lines, MD  prednisoLONE (PRELONE) 15 MG/5ML SOLN Take 13.3 mLs (40 mg total) by mouth daily before breakfast. Continue until tapered by GI. 02/05/17  Yes Mikhail, Velta Addison, DO  QUEtiapine (SEROQUEL) 25 MG tablet Take 1 tablet (25 mg total) by mouth at bedtime. May take 2 tablets if needed Patient taking differently: Take 25 mg by mouth at bedtime as needed (sleep). May take 2 tablets if needed 02/16/17 02/16/18 Yes Dara Hoyer, PA-C  spironolactone (ALDACTONE) 50 MG tablet Take 4 tablets (200 mg total) by mouth daily. 02/19/17  Yes Hongalgi, Lenis Dickinson, MD  Adhesive Bandages PADS 1 application by Does not apply route 3 (three) times daily as needed. Patient not taking: Reported on 03/26/2017 03/07/17   Martinique, Betty G, MD  collagenase (SANTYL) ointment Apply topically daily. Apply to Unstageable pressure injury to coccyx once daily Patient not taking: Reported on 03/26/2017 02/19/17    Modena Jansky, MD  docusate sodium (COLACE) 100 MG capsule Take 1 capsule (100 mg total) by mouth 2 (two) times daily. Patient not taking: Reported on 03/26/2017 02/05/17   Cristal Ford, DO  ondansetron (ZOFRAN) 4 MG tablet Take 1 tablet (4 mg total) by mouth every 8 (eight) hours as needed for nausea or vomiting. Patient not taking: Reported on 03/26/2017 02/13/17   Aline August, MD  thiamine 100 MG tablet Take 1 tablet (100 mg total) by mouth daily. Patient not taking: Reported on 03/26/2017 12/29/16   Cristal Ford, DO    Physical Exam: Vitals:   03/26/17 1828 03/26/17 1830 03/26/17 1846 03/26/17 1900  BP: (!) 135/93 (!) 130/92  (!) 128/91  Pulse: (!) 106 (!) 106  (!) 101  Resp: 16 19  15   Temp:   99 F (37.2 C)   TempSrc:   Rectal   SpO2: 99% 99%  97%  Weight:        Constitutional: NAD, calm, comfortable Vitals:   03/26/17 1828 03/26/17 1830 03/26/17 1846 03/26/17 1900  BP: (!) 135/93 (!) 130/92  (!) 128/91  Pulse: (!) 106 (!) 106  (!) 101  Resp: 16 19  15   Temp:   99 F (37.2 C)   TempSrc:   Rectal   SpO2: 99% 99%  97%  Weight:       Eyes: PERRL, she has scleral icterus which she notes is improved.  ENMT: Mucous membranes are moist. She has yellowing under tongue.  She has candidal plaques on tongue and oropharynx.   Neck: normal, supple, no masses Respiratory: Some crackles in left base, no wheezing.  Otherwise clear Cardiovascular: Tachycardic, RR, No murmur noted.   Abdomen: She has some distention, but soft.  She has some mild epigastric tenderness.   Musculoskeletal: Very thin limbs, normal movement.  Skin: She has impressive bruising on chest, arms and legs.  She reports this is improving.  Telangectasias on the chest.  She has a healing wound on the buttocks, appears to be at most a stage 1 with some erythema.  No open wounds or drainage.  Neurologic: She is alert and oriented.  She remembers most of what happened today.  She has intact movement in the  extremities.  Psychiatric: Normal judgment and insight.  Normal mood.    Labs on Admission: I have personally reviewed following labs and imaging studies  CBC:  Recent Labs Lab 03/26/17 1756  WBC 16.6*  NEUTROABS 15.4*  HGB 13.5  HCT 39.5  MCV 103.4*  PLT 628   Basic Metabolic Panel:  Recent Labs Lab 03/26/17 1756  NA 133*  K 4.1  CL 98*  CO2 24  GLUCOSE 139*  BUN 15  CREATININE 0.77  CALCIUM 9.3   GFR: Estimated Creatinine Clearance: 59.3 mL/min (by C-G formula based on SCr of 0.77 mg/dL). Liver Function Tests:  Recent Labs Lab 03/26/17 1756  AST 64*  ALT 47  ALKPHOS 178*  BILITOT 2.8*  PROT 7.4  ALBUMIN 3.2*   No results for input(s): LIPASE, AMYLASE in the last 168 hours.  Recent Labs Lab 03/26/17 1756  AMMONIA 104*   Coagulation Profile: No results for input(s): INR, PROTIME in the last 168 hours. Cardiac Enzymes: No results for input(s): CKTOTAL, CKMB, CKMBINDEX, TROPONINI in the last 168 hours. BNP (last 3 results) No results for input(s): PROBNP in the last 8760 hours. HbA1C: No results for input(s): HGBA1C in the last 72 hours. CBG: No results for input(s): GLUCAP in the last 168 hours. Lipid Profile: No results for input(s): CHOL, HDL, LDLCALC, TRIG, CHOLHDL, LDLDIRECT in the last 72 hours. Thyroid Function Tests: No results for input(s): TSH, T4TOTAL, FREET4, T3FREE, THYROIDAB in the last 72 hours. Anemia Panel: No results for input(s): VITAMINB12, FOLATE, FERRITIN, TIBC, IRON, RETICCTPCT in the last 72 hours. Urine analysis:    Component Value Date/Time   COLORURINE YELLOW 03/26/2017 1757   APPEARANCEUR CLEAR 03/26/2017 1757   LABSPEC 1.004 (L) 03/26/2017 1757   PHURINE 8.0 03/26/2017 1757   GLUCOSEU NEGATIVE 03/26/2017 1757   HGBUR NEGATIVE 03/26/2017 1757   HGBUR large 04/13/2010 [place] 03/26/2017 1757   KETONESUR NEGATIVE 03/26/2017 1757   PROTEINUR NEGATIVE 03/26/2017 1757   UROBILINOGEN 0.2  04/13/2010 1228  NITRITE NEGATIVE 03/26/2017 1757   LEUKOCYTESUR NEGATIVE 03/26/2017 1757    Radiological Exams on Admission: Dg Chest 2 View  Result Date: 03/26/2017 CLINICAL DATA:  Fever and shortness of breath for 2-3 days. History of cirrhosis. Ex-smoker. EXAM: CHEST  2 VIEW COMPARISON:  X-ray dated 02/03/2017. FINDINGS: Heart size and mediastinal contours are normal. Subtle hazy opacity at the medial aspects of the right lung base, pneumonia versus superimposition of normal pulmonary vessels. Lungs otherwise clear. No pleural effusion or pneumothorax seen. No acute or suspicious osseous finding. IMPRESSION: Subtle hazy opacity at the medial aspects of the right lung base, suspicious for early developing pneumonia, alternatively superimposition of normal pulmonary vessels. Recommend follow-up chest x-ray to ensure clearing. Electronically Signed   By: Franki Cabot M.D.   On: 03/26/2017 17:27    EKG: Independently reviewed. Sinus tachycardia, no ST changes.   Assessment/Plan Sepsis  - SIRS criteria - tachycardia, WBC elevation, fever.  Likely source found on CXR.  I am not completely convinced this is pneumonia, also possible would be SBP with mild abdominal pain and the confusion this AM.  She is on rocephin and azithromycin which will be continued.  Rocephin should also cover for SBP (preferred is cefotaxime, but a third gen cephalosporin should suffice).  She does have a history of a decubitus ulcer, but this is improved and I looked at her skin tonight and appeared to be improved  - Consider diagnostic paracentesis in the AM, no emergent need tonight, already received Abx - Continue rocephin and Azithromycin - Follow up [facility] - Strep pneumonia urine antigen - IVF at 75cc/hr for 10 hours only - I halved the dose of her diuretics - she may require paracentesis if fluid accumulates - Trend lactic acid.     Protein-calorie malnutrition, severe - Ensure TID with meals, continued from home.       Alcoholic cirrhosis of liver with ascites - She reports doing well, she has been 91 days without ETOH - Continue lactulose BID, for a goal BM of 2-3 times per day - I have halved her diuretics given she is getting fluids for her sepsis, would plan to restart these at home doses if tachycardia improves - Continue prednisolone  - Continue thiamine and folic acid    DVT (deep venous thrombosis)  - Diagnosed in June, she is on weight based lovenox - Continue lovenox - Discussed with pharmacy, apparently patient was discharged home on Lovenox 1mg /kg daily, which is underdosed.  We discussed and increased her lovenox to 1.5mg /kg which is the appropriate once daily dosing.     Sacral decubitus ulcer - Improving, continue wound care as she has been doing.   Mild hyponatremia - Chronic, monitor      DVT prophylaxis: Lovenox - full dose for DVT Code Status: Full Family Communication: Husband, [name], at bedside Disposition Plan: Admit for Abx, d/c in 1-2 days Consults called: None Admission status: Telemetry, inpatient   Gilles Chiquito MD [facility] Pager [phone]  If 7PM-7AM, please contact night-coverage [email or web address] Password [facility]  03/26/2017, 8:13 PM

## 2017-03-26 NOTE — ED Provider Notes (Signed)
[facility] DEPT Provider Note   CSN: [id number] Arrival date & time: 03/26/17  1651     History   Chief Complaint Chief Complaint  Patient presents with  . Shortness of Breath  . Fever    HPI Mercedes Mitchell is a 55 y.o. female with a history of liver cirrhosis, DVT, currently on Lovenox presenting with shortness of breath, tachycardia, episode of disorientation lasting 2-3 hours upon awakening this morning. She reports fever of 102 and doesn't recall what happened this morning history is gathered by husband who was concerned that she seems really out of it. She reports shortness of breath, weakness with difficulty getting out of bed this morning. She took ibuprofen and stayed in bed and eventually felt better but she had been told by her provider to come in and be seen if she ever had a fever or shortness of breath. She reports that her abdomen is slightly distended but has been improving since her last paracentesis a month ago. She denies any abdominal pain, but has soreness from Lovenox injection. Denies nausea, vomiting, diarrhea. Last bowel movement yesterday and normal. She reports slight decrease in appetite today and urination only twice which is unusual for her. She denies any pain, no chest pain, dizziness.  HPI  Past Medical History:  Diagnosis Date  . 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  paracentesis 2.6 liters 5/4: no SBP.    Marland Kitchen Alcoholism ([place]) 12/2016  . Candida esophagitis ([place])   . Cirrhosis ([place])   . DVT (deep venous thrombosis) ([place])   . Dysphagia 2011  . Esophagitis   . Hepatic steatosis   . Hiatal hernia   . Hiatal hernia   . Macrocytic anemia 12/2016  . Malnutrition ([place]) 12/2016  . Panic type anxiety neurosis 2011  . Portal hypertension ([place])   . Portal hypertensive gastropathy [facility])     Patient Active Problem List   Diagnosis Date Noted  . Sacral decubitus ulcer, stage III  ([place]) 02/18/2017  . Abdominal pain 02/10/2017  . Acute deep vein thrombosis (DVT) of right femoral vein ([place]) 02/10/2017  . Hyponatremia 02/10/2017  . DVT (deep venous thrombosis) ([place]) 02/10/2017  . Alcoholic cirrhosis of liver with ascites ([place]) 01/12/2017  . GI bleeding 01/12/2017  . Current every day smoker 01/12/2017  . Ascites   . Coffee ground emesis   . Acute esophagitis   . Protein-calorie malnutrition, severe 12/24/2016  . Acute alcoholic hepatitis 78/29/5621  . ETOH abuse 12/23/2016  . Macrocytic anemia 12/21/2016  . 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 DYSPHAGIA 03/25/2010    Past Surgical History:  Procedure Laterality Date  . ESOPHAGOGASTRODUODENOSCOPY N/A 12/25/2016   Procedure: ESOPHAGOGASTRODUODENOSCOPY (EGD);  Surgeon: Jerene Bears, MD;  Location: [facility];  Service: Endoscopy;  Laterality: N/A;  . IR PARACENTESIS  02/02/2017  . IR TRANSCATHETER BX  02/02/2017  . IR US GUIDE VASC ACCESS RIGHT  02/02/2017  . IR VENOGRAM HEPATIC W HEMODYNAMIC EVALUATION  02/02/2017    OB History    No data available       Home Medications    Prior to Admission medications   Medication Sig Start Date End Date Taking? Authorizing Provider  bacitracin ointment Apply 1 application topically 3 (three) times daily as needed for wound care. 02/19/17  Yes Hongalgi, Lenis Dickinson, MD  camphor-menthol [facility]) lotion Apply topically as needed for itching. 02/05/17  Yes Mikhail, [place], DO  enoxaparin ([place])  60 MG/0.6ML injection Inject 0.45 mLs (45 mg total) into the skin daily. 03/21/17  Yes Martinique, Betty G, MD  feeding supplement, ENSURE ENLIVE, (ENSURE ENLIVE) LIQD Take 237 mLs by mouth 3 (three) times daily between meals. 12/28/16  Yes Mikhail, Velta Addison, DO  folic acid (FOLVITE) 1 MG tablet Take 1 tablet (1 mg total) by mouth daily. 02/16/17  Yes Dara Hoyer, PA-C  furosemide (LASIX) 20 MG tablet Take 4 tablets (80 mg total) by mouth daily. 02/19/17  Yes Hongalgi, Lenis Dickinson, MD    hydrocerin (EUCERIN) CREA Apply 1 application topically daily. Apply to bilateral legs once daily 03/07/17  Yes Martinique, Betty G, MD  lactulose (CHRONULAC) 10 GM/15ML solution Take 30 mLs (20 g total) by mouth 2 (two) times daily. 02/18/17  Yes Pyrtle, Lajuan Lines, MD  Multiple Vitamin (MULTIVITAMIN WITH MINERALS) TABS tablet Take 1 tablet by mouth daily. 12/29/16  Yes Mikhail, Velta Addison, DO  nystatin (MYCOSTATIN) 100000 UNIT/ML suspension TAKE 5ML BY MOUTH FOUR TIMES A DAY 03/01/17 03/31/17 Yes Martinique, Betty G, MD  pantoprazole (PROTONIX) 40 MG tablet Take 1 tablet (40 mg total) by mouth 2 (two) times daily. 02/18/17  Yes Pyrtle, Lajuan Lines, MD  prednisoLONE (PRELONE) 15 MG/5ML SOLN Take 13.3 mLs (40 mg total) by mouth daily before breakfast. Continue until tapered by GI. 02/05/17  Yes Mikhail, Velta Addison, DO  QUEtiapine (SEROQUEL) 25 MG tablet Take 1 tablet (25 mg total) by mouth at bedtime. May take 2 tablets if needed Patient taking differently: Take 25 mg by mouth at bedtime as needed (sleep). May take 2 tablets if needed 02/16/17 02/16/18 Yes Dara Hoyer, PA-C  spironolactone (ALDACTONE) 50 MG tablet Take 4 tablets (200 mg total) by mouth daily. 02/19/17  Yes Hongalgi, Lenis Dickinson, MD  Adhesive Bandages PADS 1 application by Does not apply route 3 (three) times daily as needed. Patient not taking: Reported on 03/26/2017 03/07/17   Martinique, Betty G, MD  collagenase (SANTYL) ointment Apply topically daily. Apply to Unstageable pressure injury to coccyx once daily Patient not taking: Reported on 03/26/2017 02/19/17   Modena Jansky, MD  docusate sodium (COLACE) 100 MG capsule Take 1 capsule (100 mg total) by mouth 2 (two) times daily. Patient not taking: Reported on 03/26/2017 02/05/17   Cristal Ford, DO  ondansetron (ZOFRAN) 4 MG tablet Take 1 tablet (4 mg total) by mouth every 8 (eight) hours as needed for nausea or vomiting. Patient not taking: Reported on 03/26/2017 02/13/17   Aline August, MD  thiamine 100 MG tablet Take  1 tablet (100 mg total) by mouth daily. Patient not taking: Reported on 03/26/2017 12/29/16   Cristal Ford, DO    Family History Family History  Problem Relation Age of Onset  . Hyperlipidemia Father   . Heart disease Father   . Alcoholism Father   . Heart attack Father   . Alcoholism Mother   . Alcoholism Sister     Social History Social History  Substance Use Topics  . Smoking status: Former Smoker    Types: Cigarettes  . Smokeless tobacco: Never Used     Comment: Reports she quit "about 6 months ago"  . Alcohol use Yes     Comment: Last drink: 36 days ago      Allergies   Patient has no known allergies.   Review of Systems Review of Systems  Constitutional: Positive for chills and fever.  HENT: Negative for congestion.   Eyes: Negative for pain and visual disturbance.  Respiratory:  Positive for shortness of breath. Negative for cough, choking, chest tightness, wheezing and stridor.   Cardiovascular: Positive for palpitations. Negative for chest pain and leg swelling.  Gastrointestinal: Positive for abdominal distention. Negative for abdominal pain, blood in stool, diarrhea, nausea and vomiting.  Genitourinary: Negative for dysuria, flank pain, frequency and hematuria.  Musculoskeletal: Negative for arthralgias, back pain, gait problem, myalgias, neck pain and neck stiffness.  Skin: Negative for color change, pallor and rash.  Neurological: Negative for dizziness, seizures, syncope, speech difficulty, weakness, light-headedness, numbness and headaches.     Physical Exam Updated Vital Signs BP (!) 128/91   Pulse (!) 101   Temp 99 F (37.2 C) (Rectal)   Resp 15   Wt 46.7 kg (103 lb)   SpO2 97%   BMI 16.62 kg/m   Physical Exam  Constitutional: She is oriented to person, place, and time. She appears well-developed and well-nourished. No distress.  Patient is afebrile after use of antipyretics prior to arrival. Chronically ill-appearing and cachectic, sitting  comfortably in chair in no acute distress. Patient is speaking in full sentences without difficulty.  HENT:  Head: Normocephalic and atraumatic.  Mouth/Throat: Oropharynx is clear and moist. No oropharyngeal exudate.  Eyes: Pupils are equal, round, and reactive to light. Conjunctivae and EOM are normal. Right eye exhibits no discharge. Left eye exhibits no discharge.  Neck: Normal range of motion. Neck supple.  Cardiovascular: Normal rate, regular rhythm, normal heart sounds and intact distal pulses.   No murmur heard. Pulmonary/Chest: Effort normal and breath sounds normal. No respiratory distress. She has no wheezes. She has no rales. She exhibits no tenderness.  Abdominal: Soft. Bowel sounds are normal. She exhibits distension. She exhibits no mass. There is no tenderness. There is no rebound and no guarding.  Musculoskeletal: Normal range of motion. She exhibits no edema, tenderness or deformity.  Neurological: She is alert and oriented to person, place, and time. No cranial nerve deficit or sensory deficit. She exhibits normal muscle tone. Coordination normal.  Mild asterixis Neurologic Exam:  - Mental status: Patient is alert and cooperative. Fluent speech and words are clear. Coherent thought processes and insight is good. Patient is oriented x 4 to person, place, time and event.  - Cranial nerves:  CN III, IV, VI: pupils equally round, reactive to light both direct and conscensual and normal accommodation. Full extra-ocular movement. CN V: motor temporalis and masseter strength intact. CN VII : muscles of facial expression intact. CN X :  midline uvula. XI strength of sternocleidomastoid and trapezius muscles 5/5, XII: tongue is midline when protruded. - Motor: No involuntary movements. Muscle tone and bulk normal throughout. Muscle strength is 5/5 in bilateral shoulder abduction, elbow flexion and extension, grip, hip extension, flexion, leg flexion and extension, ankle dorsiflexion and  plantar flexion.  - Sensory: Proprioception, light tough sensation intact in all extremities.  - Cerebellar: rapid alternating movements and point to point movement intact in upper and lower extremities. Normal stance and gait.  Skin: Skin is warm and dry. She is not diaphoretic. No erythema. No pallor.  Psychiatric: She has a normal mood and affect.  Nursing note and vitals reviewed.    ED Treatments / Results  Labs (all labs ordered are listed, but only abnormal results are displayed) Labs Reviewed  AMMONIA - Abnormal; Notable for the following:       Result Value   Ammonia 104 (*)    All other components within normal limits  COMPREHENSIVE METABOLIC PANEL - Abnormal;  Notable for the following:    Sodium 133 (*)    Chloride 98 (*)    Glucose, Bld 139 (*)    Albumin 3.2 (*)    AST 64 (*)    Alkaline Phosphatase 178 (*)    Total Bilirubin 2.8 (*)    All other components within normal limits  CBC WITH DIFFERENTIAL/PLATELET - Abnormal; Notable for the following:    WBC 16.6 (*)    RBC 3.82 (*)    MCV 103.4 (*)    MCH 35.3 (*)    Neutro Abs 15.4 (*)    Lymphs Abs 0.6 (*)    All other components within normal limits  URINALYSIS, ROUTINE W REFLEX MICROSCOPIC - Abnormal; Notable for the following:    Specific Gravity, Urine 1.004 (*)    All other components within normal limits  CG4 I-STAT (LACTIC ACID) - Abnormal; Notable for the following:    Lactic Acid, Venous 2.16 (*)    All other components within normal limits  CULTURE, BLOOD (ROUTINE X 2)  CULTURE, BLOOD (ROUTINE X 2)  I-STAT CG4 LACTIC ACID, ED    EKG  EKG Interpretation None       Radiology Dg Chest 2 View  Result Date: 03/26/2017 CLINICAL DATA:  Fever and shortness of breath for 2-3 days. History of cirrhosis. Ex-smoker. EXAM: CHEST  2 VIEW COMPARISON:  X-ray dated 02/03/2017. FINDINGS: Heart size and mediastinal contours are normal. Subtle hazy opacity at the medial aspects of the right lung base, pneumonia  versus superimposition of normal pulmonary vessels. Lungs otherwise clear. No pleural effusion or pneumothorax seen. No acute or suspicious osseous finding. IMPRESSION: Subtle hazy opacity at the medial aspects of the right lung base, suspicious for early developing pneumonia, alternatively superimposition of normal pulmonary vessels. Recommend follow-up chest x-ray to ensure clearing. Electronically Signed   By: Franki Cabot M.D.   On: 03/26/2017 17:27    Procedures Procedures (including critical care time)  Medications Ordered in ED Medications  azithromycin (ZITHROMAX) 500 mg in dextrose 5 % 250 mL IVPB (500 mg Intravenous New Bag/Given 03/26/17 1920)  azithromycin (ZITHROMAX) 500 mg in dextrose 5 % 250 mL IVPB (not administered)  cefTRIAXone (ROCEPHIN) 2 g in dextrose 5 % 50 mL IVPB (not administered)  albuterol (PROVENTIL) (2.5 MG/3ML) 0.083% nebulizer solution 5 mg (5 mg Nebulization Given 03/26/17 1827)  cefTRIAXone (ROCEPHIN) 1 g in dextrose 5 % 50 mL IVPB (0 g Intravenous Stopped 03/26/17 1917)  sodium chloride 0.9 % bolus 1,000 mL (1,000 mLs Intravenous New Bag/Given 03/26/17 1920)     Initial Impression / Assessment and Plan / ED Course  I have reviewed the triage vital signs and the nursing notes.  Pertinent labs & imaging results that were available during my care of the patient were reviewed by me and considered in my medical decision making (see chart for details).     Patient presents with general malaise and weakness with disorientation upon awakening this morning. He was febrile at home with a temperature high of 102 has taken antipyretics prior to arrival. She presents with complaints of shortness of breath and palpitations.  On exam she is chronically ill-appearing and cachectic, afebrile here in the emergency department, tachycardic. Slightest asterixis on exam. Abdominal distention.  Normal neuro exam  Elevated lactate, tachycardic, elevated white count, patient meets SIRS  criteria for sepsis. Chest x-ray with evidence of pneumonia as source. Patient was started on IV fluids and antibiotics.  Consulted hospitalist for admission  19:10- Spoke  to triad hospitalist who will be admitting patient.  Final Clinical Impressions(s) / ED Diagnoses   Final diagnoses:  Community acquired pneumonia of right middle lobe of lung [facility])    New Prescriptions New Prescriptions   No medications on file     Dossie Der 03/26/17 1922    Tegeler, Gwenyth Allegra, MD 03/27/17 [phone]

## 2017-03-26 NOTE — Progress Notes (Addendum)
Pharmacy Antibiotic Note  Mercedes Mitchell is a 56 y.o. female admitted on 03/26/2017 with pneumonia.  Pharmacy has been consulted for ceftriaxone and azithromycin dosing.  Plan:  Ceftriaxone 2gm IV q24h for sepsis and h/o cirrhosis (h/o ascites)  Azithromycin 500mg  IV q24h  No dose adjustment required for renal fx for either antibiotics, pharmacy will sign-off  Weight: 103 lb (46.7 kg)  Temp (24hrs), Avg:98.5 F (36.9 C), Min:98.5 F (36.9 C), Max:98.5 F (36.9 C)  No results for input(s): WBC, CREATININE, LATICACIDVEN, VANCOTROUGH, VANCOPEAK, VANCORANDOM, GENTTROUGH, GENTPEAK, GENTRANDOM, TOBRATROUGH, TOBRAPEAK, TOBRARND, AMIKACINPEAK, AMIKACINTROU, AMIKACIN in the last 168 hours.  Estimated Creatinine Clearance: 59.3 mL/min (by C-G formula based on SCr of 0.58 mg/dL).    No Known Allergies  Antimicrobials this admission: 8/4 ceftriaxone: 8/4 azithromycin:  Dose adjustments this admission:  Microbiology results:   Thank you for allowing pharmacy to be a part of this patient's care.  Doreene Eland, PharmD, BCPS.   Pager: [phone] 03/26/2017 6:05 PM

## 2017-03-26 NOTE — Progress Notes (Signed)
RN may call report to [place] [phone] at [phone]

## 2017-03-26 NOTE — Progress Notes (Signed)
[facility] Progress Note Patient Name: Kang Ishida DOB: 01/24/1961 MRN: [id number]   Date of Service  03/26/2017  HPI/Events of Note  Per Sepsis Protocol the patient needs follow up Lactic Acid level now.   eICU Interventions  Will order: 1. Lactic Acid level STAT.  Primary team to follow up lab results.      Intervention Category Minor Interventions: Clinical assessment - ordering diagnostic tests  Lysle Dingwall 03/26/2017, 9:34 PM

## 2017-03-27 DIAGNOSIS — K7031 Alcoholic cirrhosis of liver with ascites: Secondary | ICD-10-CM

## 2017-03-27 DIAGNOSIS — E43 Unspecified severe protein-calorie malnutrition: Secondary | ICD-10-CM

## 2017-03-27 DIAGNOSIS — I82411 Acute embolism and thrombosis of right femoral vein: Secondary | ICD-10-CM

## 2017-03-27 DIAGNOSIS — L89153 Pressure ulcer of sacral region, stage 3: Secondary | ICD-10-CM

## 2017-03-27 DIAGNOSIS — J181 Lobar pneumonia, unspecified organism: Secondary | ICD-10-CM

## 2017-03-27 LAB — HIV ANTIBODY (ROUTINE TESTING W REFLEX): HIV Screen 4th Generation wRfx: NONREACTIVE

## 2017-03-27 LAB — STREP PNEUMONIAE URINARY ANTIGEN: Strep Pneumo Urinary Antigen: NEGATIVE

## 2017-03-27 MED ORDER — LACTULOSE 10 GM/15ML PO SOLN
20.0000 g | Freq: Three times a day (TID) | ORAL | Status: DC
  Administered 2017-03-27 – 2017-03-28 (×2): 20 g via ORAL
  Filled 2017-03-27 (×3): qty 30

## 2017-03-27 MED ORDER — LEVOFLOXACIN IN D5W 500 MG/100ML IV SOLN
500.0000 mg | INTRAVENOUS | Status: DC
  Administered 2017-03-27: 500 mg via INTRAVENOUS
  Filled 2017-03-27: qty 100

## 2017-03-27 MED ORDER — ACYCLOVIR 200 MG PO CAPS
200.0000 mg | ORAL_CAPSULE | Freq: Every day | ORAL | Status: DC
  Administered 2017-03-27 – 2017-03-28 (×5): 200 mg via ORAL
  Filled 2017-03-27 (×6): qty 1

## 2017-03-27 MED ORDER — SPIRONOLACTONE 100 MG PO TABS
200.0000 mg | ORAL_TABLET | Freq: Every day | ORAL | Status: DC
  Administered 2017-03-28: 200 mg via ORAL
  Filled 2017-03-27 (×2): qty 2

## 2017-03-27 MED ORDER — FUROSEMIDE 40 MG PO TABS
80.0000 mg | ORAL_TABLET | Freq: Every day | ORAL | Status: DC
  Administered 2017-03-28: 80 mg via ORAL
  Filled 2017-03-27: qty 2

## 2017-03-27 NOTE — Progress Notes (Signed)
PROGRESS NOTE    Mercedes Mitchell  ZOX:[id number] DOB: 1961/07/22 DOA: 03/26/2017 PCP: Martinique, Betty G, MD   Brief Narrative:  56 year old female with past medical history of alcoholic cirrhosis, portal hypertension, DVT was brought to the hospital for confusion and fever. Upon admission patient's ammonia was noted to be elevated therefore she was started on frequent lactulose. Chest x-ray also showed possible patchy infiltrate concerning for pneumonia therefore she was started on antibiotics.   Assessment & Plan:   Active Problems:   Protein-calorie malnutrition, severe   Alcoholic cirrhosis of liver with ascites (HCC)   DVT (deep venous thrombosis) (HCC)   Sacral decubitus ulcer, stage III (HCC)   Sepsis ([place])    Hospital-acquired pneumonia -Supplemental oxygen. Nebulizer treatment as needed -Stop azithromycin and Rocephin, will start patient on short course of Levaquin -Supportive care -Urine strep antigen-negative  Acute metabolic encephalopathy Acute hepatic encephalopathy, improved -She was noted to have elevated ammonia yesterday. Mental status back to baseline today per the patient and the husband was at bedside -Closely monitor and continue home dose of lactulose which be titrated to 3-4 soft bowel movements daily  Alcoholic liver cirrhosis with ascites -Resume her home dose of Aldactone (200mg ) and Lasix (80mg ) -Continue prednisone, thiamine and folate -Hold off on paracentesis at this time. -Continue lactulose to titrate to 3-4 bowel movements daily -Currently on liver transplant list in [place]. Per patient she has been 92 days sober.  Right lower extremity DVT, diagnosed in June 2018 -Currently on Lovenox, plan for 3 months per outpatient PCP Dr. Martinique  Sacral decubitus stage III ulcer -Healing at this time without any acute signs of infection  Oral thrush -She can get nystatin if necessary.  Moderate to severe protein calorie malnutrition -Ensure 3  times a day with meals ordered. We'll ORDER for nutrition consult  DVT prophylaxis: Lovenox Code Status: Full code Family Communication:  Husband at bedside Disposition Plan: If continues to do well will likely discharge in next 24-48 hours  Consultants:   None  Procedures:   None  Antimicrobials:   Ceftriaxone 8/4 > 8/5   Azithromycin 8/4 > 8/5  Levaquin 8/5   Subjective: Patient states she is feeling a little better and her confusion has resolved, husband also agrees who is at the bedside.  Tolerating PO diet. Denied any abd pain. Had 2 soft BM overnight. Remains afebrile.   Objective: Vitals:   03/26/17 1846 03/26/17 1900 03/26/17 2105 03/27/17 0549  BP:  (!) 128/91 124/81 134/86  Pulse:  (!) 101 97 (!) 107  Resp:  15 18 18   Temp: 99 F (37.2 C)  97.8 F (36.6 C) 98.3 F (36.8 C)  TempSrc: Rectal  Oral Oral  SpO2:  97% 100% 99%  Weight:   46.7 kg (102 lb 15.3 oz)   Height:   5\' 6"  (1.676 m)     Intake/Output Summary (Last 24 hours) at 03/27/17 1238 Last data filed at 03/27/17 0700  Gross per 24 hour  Intake           2197.5 ml  Output              600 ml  Net           1597.5 ml   Filed Weights   03/26/17 1801 03/26/17 2105  Weight: 46.7 kg (103 lb) 46.7 kg (102 lb 15.3 oz)    Examination:  General exam: Appears calm and comfortable  Respiratory system: Clear to auscultation. Respiratory effort normal. Cardiovascular  system: S1 & S2 heard, RRR. No JVD, murmurs, rubs, gallops or clicks. No pedal edema. Gastrointestinal system: + fluid wave, Abdomen is midly distended, soft and nontender. No organomegaly or masses felt. Normal bowel sounds heard. Central nervous system: Alert and oriented. No focal neurological deficits. Extremities: Symmetric 5 x 5 power. Skin: No rashes, lesions or ulcers Psychiatry: Judgement and insight appear normal. Mood & affect appropriate.     Data Reviewed:   CBC:  Recent Labs Lab 03/26/17 1756  WBC 16.6*    NEUTROABS 15.4*  HGB 13.5  HCT 39.5  MCV 103.4*  PLT 195   Basic Metabolic Panel:  Recent Labs Lab 03/26/17 1756  NA 133*  K 4.1  CL 98*  CO2 24  GLUCOSE 139*  BUN 15  CREATININE 0.77  CALCIUM 9.3   GFR: Estimated Creatinine Clearance: 59.3 mL/min (by C-G formula based on SCr of 0.77 mg/dL). Liver Function Tests:  Recent Labs Lab 03/26/17 1756  AST 64*  ALT 47  ALKPHOS 178*  BILITOT 2.8*  PROT 7.4  ALBUMIN 3.2*   No results for input(s): LIPASE, AMYLASE in the last 168 hours.  Recent Labs Lab 03/26/17 1756  AMMONIA 104*   Coagulation Profile: No results for input(s): INR, PROTIME in the last 168 hours. Cardiac Enzymes: No results for input(s): CKTOTAL, CKMB, CKMBINDEX, TROPONINI in the last 168 hours. BNP (last 3 results) No results for input(s): PROBNP in the last 8760 hours. HbA1C: No results for input(s): HGBA1C in the last 72 hours. CBG: No results for input(s): GLUCAP in the last 168 hours. Lipid Profile: No results for input(s): CHOL, HDL, LDLCALC, TRIG, CHOLHDL, LDLDIRECT in the last 72 hours. Thyroid Function Tests: No results for input(s): TSH, T4TOTAL, FREET4, T3FREE, THYROIDAB in the last 72 hours. Anemia Panel: No results for input(s): VITAMINB12, FOLATE, FERRITIN, TIBC, IRON, RETICCTPCT in the last 72 hours. Sepsis Labs:  Recent Labs Lab 03/26/17 1822 03/26/17 2153  LATICACIDVEN 2.16* 2.0*    No results found for this or any previous visit (from the past 240 hour(s)).       Radiology Studies: Dg Chest 2 View  Result Date: 03/26/2017 CLINICAL DATA:  Fever and shortness of breath for 2-3 days. History of cirrhosis. Ex-smoker. EXAM: CHEST  2 VIEW COMPARISON:  X-ray dated 02/03/2017. FINDINGS: Heart size and mediastinal contours are normal. Subtle hazy opacity at the medial aspects of the right lung base, pneumonia versus superimposition of normal pulmonary vessels. Lungs otherwise clear. No pleural effusion or pneumothorax seen. No  acute or suspicious osseous finding. IMPRESSION: Subtle hazy opacity at the medial aspects of the right lung base, suspicious for early developing pneumonia, alternatively superimposition of normal pulmonary vessels. Recommend follow-up chest x-ray to ensure clearing. Electronically Signed   By: Franki Cabot M.D.   On: 03/26/2017 17:27        Scheduled Meds: . enoxaparin  70 mg Subcutaneous q1800  . feeding supplement (ENSURE ENLIVE)  237 mL Oral TID BM  . folic acid  1 mg Oral Daily  . furosemide  40 mg Oral Daily  . lactulose  20 g Oral BID  . multivitamin with minerals  1 tablet Oral Daily  . nystatin  5 mL Oral QID  . pantoprazole  40 mg Oral BID  . prednisoLONE  40 mg Oral QAC breakfast  . spironolactone  100 mg Oral Daily  . thiamine  100 mg Oral Daily   Continuous Infusions: . azithromycin    . cefTRIAXone (ROCEPHIN)  IV  LOS: 1 day    Time spent: 35 mins    Jamaia Brum Arsenio Loader, MD [facility] Pager [phone]   If 7PM-7AM, please contact night-coverage [email or web address] Password [facility] 03/27/2017, 12:38 PM

## 2017-03-27 NOTE — Progress Notes (Signed)
During attempt to stop IV skin site from bleeding. Blood popped into this RN's eye. Pt was very tearful and apologetic. Reassured that it was okay. Explained protocol. Pt was very receptive and agreeable.. New onset of cold sore on right upper lip. MD made aware and plan of care updated with medication. Cont with plan

## 2017-03-27 NOTE — Progress Notes (Signed)
CRITICAL VALUE ALERT  Critical Value:  Lactic Acid 2.0  Date & Time Notied:  03/26/17  2210  Provider Notified: Daryll Drown  Orders Received/Actions taken: No new orders received

## 2017-03-28 ENCOUNTER — Telehealth (HOSPITAL_COMMUNITY): Payer: Self-pay | Admitting: Psychology

## 2017-03-28 ENCOUNTER — Encounter (HOSPITAL_COMMUNITY): Payer: Self-pay | Admitting: Psychology

## 2017-03-28 ENCOUNTER — Other Ambulatory Visit (HOSPITAL_COMMUNITY): Payer: 59

## 2017-03-28 DIAGNOSIS — J189 Pneumonia, unspecified organism: Principal | ICD-10-CM

## 2017-03-28 LAB — CBC
HEMATOCRIT: 31.7 % — AB (ref 36.0–46.0)
Hemoglobin: 10.6 g/dL — ABNORMAL LOW (ref 12.0–15.0)
MCH: 34.6 pg — AB (ref 26.0–34.0)
MCHC: 33.4 g/dL (ref 30.0–36.0)
MCV: 103.6 fL — AB (ref 78.0–100.0)
PLATELETS: 192 10*3/uL (ref 150–400)
RBC: 3.06 MIL/uL — ABNORMAL LOW (ref 3.87–5.11)
RDW: 14.9 % (ref 11.5–15.5)
WBC: 11 10*3/uL — AB (ref 4.0–10.5)

## 2017-03-28 LAB — COMPREHENSIVE METABOLIC PANEL
ALK PHOS: 142 U/L — AB (ref 38–126)
ALT: 35 U/L (ref 14–54)
AST: 40 U/L (ref 15–41)
Albumin: 2.5 g/dL — ABNORMAL LOW (ref 3.5–5.0)
Anion gap: 8 (ref 5–15)
BILIRUBIN TOTAL: 1.7 mg/dL — AB (ref 0.3–1.2)
BUN: 16 mg/dL (ref 6–20)
CALCIUM: 9 mg/dL (ref 8.9–10.3)
CHLORIDE: 103 mmol/L (ref 101–111)
CO2: 23 mmol/L (ref 22–32)
CREATININE: 0.47 mg/dL (ref 0.44–1.00)
GFR calc non Af Amer: >60 mL/min (ref >60)
GLUCOSE: 102 mg/dL — AB (ref 65–99)
Potassium: 3.9 mmol/L (ref 3.5–5.1)
Sodium: 134 mmol/L — ABNORMAL LOW (ref 135–145)
Total Protein: 5.7 g/dL — ABNORMAL LOW (ref 6.5–8.1)

## 2017-03-28 LAB — MAGNESIUM: Magnesium: 1.8 mg/dL (ref 1.7–2.4)

## 2017-03-28 MED ORDER — LEVOFLOXACIN 750 MG PO TABS: 750.0000 mg | ORAL_TABLET | Freq: Every day | ORAL | 0 refills | Status: AC

## 2017-03-28 MED ORDER — ACYCLOVIR 200 MG PO CAPS: 200.0000 mg | ORAL_CAPSULE | Freq: Every day | ORAL | 0 refills | Status: AC

## 2017-03-28 MED ORDER — ENOXAPARIN SODIUM 60 MG/0.6ML ~~LOC~~ SOLN: 1.5000 mg/kg | SUBCUTANEOUS | 1 refills | Status: DC

## 2017-03-28 MED ORDER — LEVOFLOXACIN 750 MG PO TABS
750.0000 mg | ORAL_TABLET | Freq: Every day | ORAL | Status: DC
  Administered 2017-03-28: 750 mg via ORAL
  Filled 2017-03-28: qty 1

## 2017-03-28 NOTE — Discharge Instructions (Signed)

## 2017-03-28 NOTE — Progress Notes (Signed)
Patient remains A&Ox4, forgetful related to medications. Pt is ambulatory without assist.. Discharge instructions reviewed pt/spouse. Questions, concerns denied.

## 2017-03-28 NOTE — Progress Notes (Signed)
    Daily Group Progress Note  Program: CD-IOP   03/28/2017 [name] [id number]  Diagnosis:  No diagnosis found.   Sobriety Date: 5/4  Group Time: 1-2:30pm  Participation Level: Active  Behavioral Response: Sharing  Type of Therapy: Process Group  Interventions: Supportive  Topic: Process: The first part of group was spent in process. Members shared how they are doing and feeling in early recovery. They identified their 'shining moment' and 'speed bump'. At least two group members noted how differently they felt today as compared to Monday and the changing moods and outlooks in early recovery. One member appeared today after having missed yesterday's group session. He had returned to use and described the events that had led to his drug use. A drug test was collected from him.  Group Time: 2:30-4pm  Participation Level: Active  Behavioral Response: Sharing  Type of Therapy: Psycho-education Group  Interventions: CBT  Topic: Psycho-Ed: Popsicle Sticks. The second half of group included members reading the word or phrase on the popsicle stick they had picked earlier in the session. They are asked to describe what this means to them at this time in their new and sober life. There was a good discussion as group members shared their thoughts and provided feedback to each other. The final few minutes included members disclosing plans for the upcoming weekend and how they intended to work their program and return to group on Monday with the same sobriety date.  Summary: The patient reported her shining moment was that she had been discharged from the '[facility]' and she was very pleased with this news. She also pointed out that her husband is feeling better. He had hurt his shoulder and she was having to help him. She admitted that her speed bump was a very pulse at the physical therapist's office. It was 119. She didn't know why it was so high and expressed concern. The  counselor suggested that they get her pulse and blood pressure taken at the break and she seemed relieved at this suggestion. The patient reported she had not slept well that last two nights and felt like part of the problems was having gone to bed so early the night before. She felt like she had 'messed up my sleep pattern'. During the psycho-ed, the patient reported her word was 'responsibility'. She became teary as she recounted the past few years and her growing disregard for her responsibilities. The patient noted that she is making changes and taking more responsibility. Her family members are noticing these changes and she finds the positive feedback encouraging. The patient made some good comments and responded well to this intervention. She is making progress in regaining her physical and cognitive health.    UDS collected: No Results:  AA/NA attended?: YesThursday  Sponsor?: No   Brandon Melnick, LCAS 03/28/2017 4:59 PM

## 2017-03-28 NOTE — Discharge Summary (Addendum)
Physician Discharge Summary  Mercedes Mitchell CHE:[id number] DOB: 09-04-1960 DOA: 03/26/2017  PCP: Martinique, Betty G, MD  Admit date: 03/26/2017 Discharge date: 03/28/2017  Admitted From: Home Disposition: Home  Recommendations for Outpatient Follow-up:  1. Follow up with PCP in 1-2 weeks 2. Take Levaquin orally daily for presumed pneumonia 3. Lovenox 1.5 mg/kg subcutaneous daily. This has been called in to her pharmacy who stated that it will arrive tomorrow therefore will give her 1 dose prior to her discharge today.   Home Health: None Equipment/Devices: None Discharge Condition: Stable CODE STATUS: Full Diet recommendation: 2 g sodium diet  Brief/Interim Summary: 56 year old female with past medical history of alcoholic liver cirrhosis, portal hypertension, DVT on anticoagulation was brought to the ER for confusion and fever. Upon admission she was found to have elevated ammonia therefore put on frequent lactulose which improved her mental status. Chest x-ray showed possible patchy infiltrate concerning for pneumonia therefore started on IV antibiotics and later transitioned to oral Levaquin. Over the course of 48 hours her mental status returned to her baseline and her respiratory symptoms improved. Her cultures remained negative and she remained afebrile. Today she is deemed stable to be discharged with outpatient follow-up with her primary care physician within next 1-2 weeks. She is also given Levaquin to be taken orally for presumed healthcare acquired pneumonia. Patient does not any complaints this morning and wishes to be discharged. I  Discharge Diagnoses:  Active Problems:   Protein-calorie malnutrition, severe   Alcoholic cirrhosis of liver with ascites (HCC)   DVT (deep venous thrombosis) (HCC)   Sacral decubitus ulcer, stage III (HCC)   Sepsis (HCC)  Hospital-acquired pneumonia -Azithromycin and Rocephin stopped. Switch to oral Levaquin -Supportive care as  outpatient  Acute metabolic encephalopathy likely due to elevated ammonia -This is resolved today -Advised to take lactulose twice daily to titrate to 2-3 soft bowel movements daily. I also advised her take extra dose if necessary.  Alcohol liver cirrhosis with ascites -Resume home dose of Aldactone and Lasix -Continue prednisone, thiamine and folate -Follow-up at liver transplant center in [place]. She has been sober for 93 days now  History of right lower extremity DVT diagnosed in June 2018 -Currently on Lovenox 1.5 mg/kg daily. Follows up with Dr. 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 Will prescribe acyclovir to be taken for 5 days.  Sacral decubitus stage III ulcer -Healing without any signs of infection  Moderate to severe protein calorie malnutrition -Encourage to eat and drink oral diet  Discharge Instructions   Allergies as of 03/28/2017   No Known Allergies     Medication List    TAKE these medications   acyclovir 200 MG capsule Commonly known as:  ZOVIRAX Take 1 capsule (200 mg total) by mouth 5 (five) times daily.   Adhesive Bandages Pads 1 application by Does not apply route 3 (three) times daily as needed.   bacitracin ointment Apply 1 application topically 3 (three) times daily as needed for wound care.   camphor-menthol lotion Commonly known as:  SARNA Apply topically as needed for itching.   collagenase ointment Commonly known as:  SANTYL Apply topically daily. Apply to Unstageable pressure injury to coccyx once daily   docusate sodium 100 MG capsule Commonly known as:  COLACE Take 1 capsule (100 mg total) by mouth 2 (two) times daily.   enoxaparin 60 MG/0.6ML injection Commonly known as:  LOVENOX Inject 0.7 mLs (70 mg total) into the skin daily. What changed:  how much to take   feeding supplement (ENSURE ENLIVE) Liqd Take 237 mLs by mouth 3 (three) times daily between meals.   folic acid 1 MG tablet Commonly known  as:  FOLVITE Take 1 tablet (1 mg total) by mouth daily.   furosemide 20 MG tablet Commonly known as:  LASIX Take 4 tablets (80 mg total) by mouth daily.   hydrocerin Crea Apply 1 application topically daily. Apply to bilateral legs once daily   lactulose 10 GM/15ML solution Commonly known as:  CHRONULAC Take 30 mLs (20 g total) by mouth 2 (two) times daily.   levofloxacin 750 MG tablet Commonly known as:  LEVAQUIN Take 1 tablet (750 mg total) by mouth daily.   multivitamin with minerals Tabs tablet Take 1 tablet by mouth daily.   nystatin 100000 UNIT/ML suspension Commonly known as:  MYCOSTATIN TAKE 5ML BY MOUTH FOUR TIMES A DAY   ondansetron 4 MG tablet Commonly known as:  ZOFRAN Take 1 tablet (4 mg total) by mouth every 8 (eight) hours as needed for nausea or vomiting.   pantoprazole 40 MG tablet Commonly known as:  PROTONIX Take 1 tablet (40 mg total) by mouth 2 (two) times daily.   prednisoLONE 15 MG/5ML Soln Commonly known as:  PRELONE Take 13.3 mLs (40 mg total) by mouth daily before breakfast. Continue until tapered by GI.   QUEtiapine 25 MG tablet Commonly known as:  SEROQUEL Take 1 tablet (25 mg total) by mouth at bedtime. May take 2 tablets if needed What changed:  when to take this  reasons to take this  additional instructions   spironolactone 50 MG tablet Commonly known as:  ALDACTONE Take 4 tablets (200 mg total) by mouth daily.   thiamine 100 MG tablet Take 1 tablet (100 mg total) by mouth daily.       No Known Allergies  On your next visit with your primary care physician please Get Medicines reviewed and adjusted.   Please request your Prim.MD to go over all Hospital Tests and Procedure/Radiological results at the follow up, please get all Hospital records sent to your Prim MD by signing hospital release before you go home.   If you experience worsening of your admission symptoms, develop shortness of breath, life threatening  emergency, suicidal or homicidal thoughts you must seek medical attention immediately by calling 911 or calling your MD immediately  if symptoms less severe.  You Must read complete instructions/literature along with all the possible adverse reactions/side effects for all the Medicines you take and that have been prescribed to you. Take any new Medicines after you have completely understood and accpet all the possible adverse reactions/side effects.   Do not drive, operate heavy machinery, perform activities at heights, swimming or participation in water activities or provide baby sitting services if your were admitted for syncope or siezures until you have seen by Primary MD or a Neurologist and advised to do so again.  Do not drive when taking Pain medications.    Do not take more than prescribed Pain, Sleep and Anxiety Medications  Special Instructions: If you have smoked or chewed Tobacco  in the last 2 yrs please stop smoking, stop any regular Alcohol  and or any Recreational drug use.  Wear Seat belts while driving.   Please note  You were cared for by a hospitalist during your hospital stay. If you have any questions about your discharge medications or the care you received while you were in the hospital after you  are discharged, you can call the unit and asked to speak with the hospitalist on call if the hospitalist that took care of you is not available. Once you are discharged, your primary care physician will handle any further medical issues. Please note that NO REFILLS for any discharge medications will be authorized once you are discharged, as it is imperative that you return to your primary care physician (or establish a relationship with a primary care physician if you do not have one) for your aftercare needs so that they can reassess your need for medications and monitor your lab values.   Increase activity slowly         Consultations:  None   Procedures/Studies: Dg Chest 2 View  Result Date: 03/26/2017 CLINICAL DATA:  Fever and shortness of breath for 2-3 days. History of cirrhosis. Ex-smoker. EXAM: CHEST  2 VIEW COMPARISON:  X-ray dated 02/03/2017. FINDINGS: Heart size and mediastinal contours are normal. Subtle hazy opacity at the medial aspects of the right lung base, pneumonia versus superimposition of normal pulmonary vessels. Lungs otherwise clear. No pleural effusion or pneumothorax seen. No acute or suspicious osseous finding. IMPRESSION: Subtle hazy opacity at the medial aspects of the right lung base, suspicious for early developing pneumonia, alternatively superimposition of normal pulmonary vessels. Recommend follow-up chest x-ray to ensure clearing. Electronically Signed   By: Franki Cabot M.D.   On: 03/26/2017 17:27       Subjective: No complaints today. He remains afebrile. Wishes to be discharged  Discharge Exam: Vitals:   03/27/17 2023 03/28/17 0511  BP: 126/76 115/71  Pulse: (!) 106 82  Resp: 18 18  Temp: 98 F (36.7 C) (!) 97.4 F (36.3 C)   Vitals:   03/27/17 1423 03/27/17 1536 03/27/17 2023 03/28/17 0511  BP: 128/78 136/81 126/76 115/71  Pulse: 92 (!) 114 (!) 106 82  Resp: 18  18 18   Temp: 98.3 F (36.8 C)  98 F (36.7 C) (!) 97.4 F (36.3 C)  TempSrc: Oral  Oral Oral  SpO2: 99% 100% 100% 100%  Weight:      Height:        General: Pt is alert, awake, not in acute distress, scleral icterus Cardiovascular: RRR, S1/S2 +, no rubs, no gallops Respiratory: CTA bilaterally, no wheezing, no rhonchi,  Abdominal: Soft, NT,, bowel sounds +, slight fluid wave and abdominal distention without any significant discomfort Extremities: no edema, no cyanosis    The results of significant diagnostics from this hospitalization (including imaging, microbiology, ancillary and laboratory) are listed below for reference.     Microbiology: Recent Results (from the past 240  hour(s))  Blood Culture (routine x 2)     Status: None (Preliminary result)   Collection Time: 03/26/17  5:57 PM  Result Value Ref Range Status   Specimen Description BLOOD BLOOD RIGHT ARM  Final   Special Requests BOTTLES DRAWN AEROBIC AND ANAEROBIC BCAV  Final   Culture   Final    NO GROWTH 1 DAY Performed at [facility], [address] [place], [place] [address]    Report Status PENDING  Incomplete  Blood Culture (routine x 2)     Status: None (Preliminary result)   Collection Time: 03/26/17  6:02 PM  Result Value Ref Range Status   Specimen Description BLOOD BLOOD LEFT ARM  Final   Special Requests   Final    BOTTLES DRAWN AEROBIC AND ANAEROBIC Blood Culture adequate volume   Culture   Final  NO GROWTH 1 DAY Performed at [facility], [place] [address], [place], [place] [address]    Report Status PENDING  Incomplete     Labs: BNP (last 3 results)  Recent Labs  02/09/17 1823  BNP 425.9*   Basic Metabolic Panel:  Recent Labs Lab 03/26/17 1756 03/28/17 0440  NA 133* 134*  K 4.1 3.9  CL 98* 103  CO2 24 23  GLUCOSE 139* 102*  BUN 15 16  CREATININE 0.77 0.47  CALCIUM 9.3 9.0  MG  --  1.8   Liver Function Tests:  Recent Labs Lab 03/26/17 1756 03/28/17 0440  AST 64* 40  ALT 47 35  ALKPHOS 178* 142*  BILITOT 2.8* 1.7*  PROT 7.4 5.7*  ALBUMIN 3.2* 2.5*   No results for input(s): LIPASE, AMYLASE in the last 168 hours.  Recent Labs Lab 03/26/17 1756  AMMONIA 104*   CBC:  Recent Labs Lab 03/26/17 1756 03/28/17 0440  WBC 16.6* 11.0*  NEUTROABS 15.4*  --   HGB 13.5 10.6*  HCT 39.5 31.7*  MCV 103.4* 103.6*  PLT 228 192   Cardiac Enzymes: No results for input(s): CKTOTAL, CKMB, CKMBINDEX, TROPONINI in the last 168 hours. BNP: Invalid input(s): POCBNP CBG: No results for input(s): GLUCAP in the last 168 hours. D-Dimer No results for input(s): DDIMER in the last 72 hours. Hgb A1c No results for input(s): HGBA1C in the last 72  hours. Lipid Profile No results for input(s): CHOL, HDL, LDLCALC, TRIG, CHOLHDL, LDLDIRECT in the last 72 hours. Thyroid function studies No results for input(s): TSH, T4TOTAL, T3FREE, THYROIDAB in the last 72 hours.  Invalid input(s): FREET3 Anemia work up No results for input(s): VITAMINB12, FOLATE, FERRITIN, TIBC, IRON, RETICCTPCT in the last 72 hours. Urinalysis    Component Value Date/Time   COLORURINE YELLOW 03/26/2017 1757   APPEARANCEUR CLEAR 03/26/2017 1757   LABSPEC 1.004 (L) 03/26/2017 1757   PHURINE 8.0 03/26/2017 1757   GLUCOSEU NEGATIVE 03/26/2017 1757   HGBUR NEGATIVE 03/26/2017 1757   HGBUR large 04/13/2010 1228   BILIRUBINUR NEGATIVE 03/26/2017 1757   KETONESUR NEGATIVE 03/26/2017 1757   PROTEINUR NEGATIVE 03/26/2017 1757   UROBILINOGEN 0.2 04/13/2010 1228   NITRITE NEGATIVE 03/26/2017 1757   LEUKOCYTESUR NEGATIVE 03/26/2017 1757   Sepsis Labs Invalid input(s): PROCALCITONIN,  WBC,  LACTICIDVEN Microbiology Recent Results (from the past 240 hour(s))  Blood Culture (routine x 2)     Status: None (Preliminary result)   Collection Time: 03/26/17  5:57 PM  Result Value Ref Range Status   Specimen Description BLOOD BLOOD RIGHT ARM  Final   Special Requests BOTTLES DRAWN AEROBIC AND ANAEROBIC BCAV  Final   Culture   Final    NO GROWTH 1 DAY Performed at [facility], [place] [address], [place], [place] [address]    Report Status PENDING  Incomplete  Blood Culture (routine x 2)     Status: None (Preliminary result)   Collection Time: 03/26/17  6:02 PM  Result Value Ref Range Status   Specimen Description BLOOD BLOOD LEFT ARM  Final   Special Requests   Final    BOTTLES DRAWN AEROBIC AND ANAEROBIC Blood Culture adequate volume   Culture   Final    NO GROWTH 1 DAY Performed at [facility], [place] [address] [place], [place] [address]    Report Status PENDING  Incomplete     Time coordinating discharge: Over 30 minutes  SIGNED:   Ankit Arsenio Loader, MD  [facility] 03/28/2017, 12:07 PM Pager   If 7PM-7AM, please contact night-coverage [email or web address] Password [email or web address]

## 2017-03-28 NOTE — Plan of Care (Signed)
Problem: Education: Goal: Knowledge of [facility] Education information/materials will improve Outcome: Completed/Met Date Met: 03/28/17 Discussed plan of care with pt. Education understanding verbalized

## 2017-03-28 NOTE — Progress Notes (Signed)
Initial Nutrition Assessment  DOCUMENTATION CODES:   Severe malnutrition in context of chronic illness  INTERVENTION:   Ensure Enlive po BID, each supplement provides 350 kcal and 20 grams of protein  Provide education "Cirrohsis Nutrition Therapy".  NUTRITION DIAGNOSIS:   Malnutrition related to chronic illness as evidenced by severe depletion of body fat, severe depletion of muscle mass.  GOAL:   Patient will meet greater than or equal to 90% of their needs  MONITOR:   PO intake, Supplement acceptance, Labs, Weight trends  REASON FOR ASSESSMENT:   Consult Diet education  ASSESSMENT:   Pt with PMH of cirrhosis, portal HTN gastropathy, candida esophagitis, malnutrition, and DVT. Pt 91 days without ETOH. Presents this admission with sepsis.   Pt reports appetite has started to increase in her 91 days without alcohol. States she has been trying to gain wt, by drinking 2 ensure's daily. Suspect pt's energy intake is still not suffcient given her severe malnutrtion. Discussed how to gain wt by adding products that are more calorie dense.   Discussed the importance of protein intake for preservation of lean body mass. Included protein supplement information in discharge instructions.  Provided information on nutrition therapy for cirrhosis, focusing on sodium intake.  Pt reports a UBW of 105 lbs. Records indicate pt has maintained that wt for 3 months. With past admissions, wt shows to be 108-114 lb, but suspect this is from ascites. Pt reports wt has remained stable since she stopped drinking.   Nutrition-Focused physical exam completed. Findings are severe fat depletion, severe muscle depletion, and moderate edema. Pt remains malnourished. Will monitor wt trends, provide supplementation and diet education this admission.   Medications reviewed and include: folic acid, lasix, lactulose, thiamine, MVI, IV abx Labs reviewed: Na 134 (L) CBG 98-102 ALP 142 (H) Albumin 2.5 (L)  Diet  Order:  Diet Carb Modified Fluid consistency: Thin; Room service appropriate? Yes  Skin:   (stg II coccyx, sacrum)  Last BM:  03/27/17  Height:   Ht Readings from Last 1 Encounters:  03/26/17 5\' 6"  (1.676 m)    Weight:   Wt Readings from Last 1 Encounters:  03/26/17 102 lb 15.3 oz (46.7 kg)    Ideal Body Weight:  59.1 kg  BMI:  Body mass index is 16.62 kg/m.  Estimated Nutritional Needs:   Kcal:  1400-1600 (30-34 kcal/kg)  Protein:  80-90 grams (1.7-1.9 g/kg)  Fluid:  >1.4 L/day  EDUCATION NEEDS:   Education needs addressed  [place], LDN Clinical Nutrition Pager # [phone]

## 2017-03-29 ENCOUNTER — Other Ambulatory Visit: Payer: Self-pay | Admitting: Family Medicine

## 2017-03-29 DIAGNOSIS — R928 Other abnormal and inconclusive findings on diagnostic imaging of breast: Secondary | ICD-10-CM

## 2017-03-30 ENCOUNTER — Other Ambulatory Visit (HOSPITAL_COMMUNITY): Payer: 59 | Admitting: Psychology

## 2017-03-30 DIAGNOSIS — K7031 Alcoholic cirrhosis of liver with ascites: Secondary | ICD-10-CM

## 2017-03-30 DIAGNOSIS — F1020 Alcohol dependence, uncomplicated: Secondary | ICD-10-CM | POA: Diagnosis not present

## 2017-03-30 DIAGNOSIS — F1210 Cannabis abuse, uncomplicated: Secondary | ICD-10-CM

## 2017-03-30 DIAGNOSIS — F1421 Cocaine dependence, in remission: Secondary | ICD-10-CM

## 2017-03-31 ENCOUNTER — Other Ambulatory Visit (HOSPITAL_COMMUNITY): Payer: 59 | Admitting: Psychology

## 2017-03-31 ENCOUNTER — Ambulatory Visit: Payer: Managed Care, Other (non HMO) | Admitting: Physical Therapy

## 2017-03-31 DIAGNOSIS — R293 Abnormal posture: Secondary | ICD-10-CM | POA: Diagnosis present

## 2017-03-31 DIAGNOSIS — F1020 Alcohol dependence, uncomplicated: Secondary | ICD-10-CM

## 2017-03-31 DIAGNOSIS — R262 Difficulty in walking, not elsewhere classified: Secondary | ICD-10-CM

## 2017-03-31 DIAGNOSIS — F1210 Cannabis abuse, uncomplicated: Secondary | ICD-10-CM

## 2017-03-31 DIAGNOSIS — M6281 Muscle weakness (generalized): Secondary | ICD-10-CM

## 2017-03-31 NOTE — Therapy (Signed)
[facility] [address], [place], [place], [address] Phone: [phone]   Fax:  [phone]  Physical Therapy Treatment  Patient Details  Name: Mercedes Mitchell MRN: [id number] Date of Birth: [birth year] Referring Provider: Martinique, Betty G, MD  Encounter Date: 03/31/2017      PT End of Session - 03/31/17 1030    Visit Number 2   Date for PT Re-Evaluation 05/20/17   PT Start Time 1022   PT Stop Time 1100   PT Time Calculation (min) 38 min   Activity Tolerance Patient tolerated treatment well      Past Medical History:  Diagnosis Date  . 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  paracentesis 2.6 liters 5/4: no SBP.    Marland Kitchen Alcoholism ([place]) 12/2016  . Candida esophagitis ([place])   . Cirrhosis ([place])   . DVT (deep venous thrombosis) ([place])   . Dysphagia 2011  . Esophagitis   . Hepatic steatosis   . Hiatal hernia   . Hiatal hernia   . Macrocytic anemia 12/2016  . Malnutrition ([place]) 12/2016  . Panic type anxiety neurosis 2011  . Portal hypertension ([place])   . Portal hypertensive gastropathy [facility])     Past Surgical History:  Procedure Laterality Date  . ESOPHAGOGASTRODUODENOSCOPY N/A 12/25/2016   Procedure: ESOPHAGOGASTRODUODENOSCOPY (EGD);  Surgeon: Jerene Bears, MD;  Location: [facility];  Service: Endoscopy;  Laterality: N/A;  . IR PARACENTESIS  02/02/2017  . IR TRANSCATHETER BX  02/02/2017  . IR US GUIDE VASC ACCESS RIGHT  02/02/2017  . IR VENOGRAM HEPATIC W HEMODYNAMIC EVALUATION  02/02/2017    There were no vitals filed for this visit.      Subjective Assessment - 03/31/17 1026    Subjective Was in the hospital for 2 days for low grade pneumonia.  She states her doctor is OK with her continuing PT.  Reports she was "good sore" after initial visit.     Currently in Pain? No/denies   Pain Score 0-No pain                          [facility] Adult PT Treatment/Exercise - 03/31/17 0001      Knee/Hip Exercises: Stretches   Other Knee/Hip Stretches doorway psoas stretch 3x/5 bil     Knee/Hip Exercises: Aerobic   Nustep L1 7 min     Knee/Hip Exercises: Standing   Heel Raises Both;10 reps   Hip Abduction AROM;Right;Left;10 reps   Hip Extension AROM;Right;Left;10 reps   SLS with Vectors weight shift 3 ways 1 minute each     Knee/Hip Exercises: Seated   Long Arc Quad Strengthening;Right;Left;10 reps   Long Arc Quad Limitations red band   Knee/Hip Flexion red band 10x each leg   Sit to [employer] 10 reps     Shoulder Exercises: Standing   Extension Strengthening;Both;15 reps;Theraband   Theraband Level (Shoulder Extension) Level 2 (Red)   Row Strengthening;Both;15 reps;Theraband   Theraband Level (Shoulder Row) Level 2 (Red)                  PT Short Term Goals - 03/31/17 1903      PT SHORT TERM GOAL #1   Title independent with initial HEP   Time 4   Period Weeks   Status On-going     PT SHORT TERM GOAL #2   Title pt reports feeling  25% more steady when walking in the community   Time 4   Period Weeks   Status On-going           PT Long Term Goals - 03/31/17 1903      PT LONG TERM GOAL #1   Title able to get up and down from the floor easier   Time 8   Period Weeks   Status On-going     PT LONG TERM GOAL #2   Title independent with advanced HEP   Time 8   Period Weeks   Status On-going     PT LONG TERM GOAL #3   Title TUG < or = to 12 sec   Time 8   Period Weeks   Status On-going     PT LONG TERM GOAL #4   Title Berg > or = to 49/56   Time 8   Period Weeks   Status On-going     PT LONG TERM GOAL #5   Title able to demonstrate greater variation in gait speed based on DGI, due to improved stability    Time 8   Period Weeks   Status On-going               Plan - 03/31/17 1144    Clinical Impression Statement The patient  reports she is feeling better after recent hospitalization for pneumonia.  She is able to participate in low level balance and LE strengthening exercises with close supervision for safety and to monitor response.  She lacks strength to rise from a chair without using her hands or pushing on the chair with the backs of her legs to compensate.  She will need continued strengthening in order to meet her goal of getting up off the floor.     Rehab Potential Good   PT Frequency 2x / week   PT Duration 8 weeks   PT Treatment/Interventions ADLs/Self Care Home Management;Cryotherapy;Electrical Stimulation;Moist Heat;Therapeutic activities;Therapeutic exercise;Balance training;Neuromuscular re-education;Gait training;Stair training;Manual techniques;Patient/family education;Taping;Dry needling;Passive range of motion;Functional mobility training   PT Next Visit Plan overall strength UE and LE, gait, stairs, balance      Patient will benefit from skilled therapeutic intervention in order to improve the following deficits and impairments:  Abnormal gait, Difficulty walking, Decreased strength, Postural dysfunction, Decreased coordination, Decreased balance  Visit Diagnosis: Difficulty in walking, not elsewhere classified  Muscle weakness (generalized)  Abnormal posture     Problem List Patient Active Problem List   Diagnosis Date Noted  . Sepsis ([place]) 03/26/2017  . Community acquired pneumonia of right middle lobe of lung ([place])   . Sacral decubitus ulcer, stage III ([place]) 02/18/2017  . Abdominal pain 02/10/2017  . Acute deep vein thrombosis (DVT) of right femoral vein ([place]) 02/10/2017  . Hyponatremia 02/10/2017  . DVT (deep venous thrombosis) ([place]) 02/10/2017  . Alcoholic cirrhosis of liver with ascites ([place]) 01/12/2017  . GI bleeding 01/12/2017  . Current every day smoker 01/12/2017  . Ascites   . Coffee ground emesis   . Acute esophagitis   . Protein-calorie malnutrition, severe 12/24/2016   . Acute alcoholic hepatitis 52/84/1324  . ETOH abuse 12/23/2016  . Macrocytic anemia 12/21/2016  . 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 DYSPHAGIA 03/25/2010   Ruben Im, PT 03/31/17 7:05 PM Phone: [phone] Fax: [phone]  Alvera Singh 03/31/2017, 7:04 PM  [facility] [address], [place], [place], [address] Phone: [phone]   Fax:  [phone]  Name: Judeth  Lois Slagel MRN: [id number] Date of Birth: [birth year]

## 2017-04-01 ENCOUNTER — Encounter (HOSPITAL_COMMUNITY): Payer: Self-pay | Admitting: Psychology

## 2017-04-01 ENCOUNTER — Other Ambulatory Visit: Payer: Self-pay | Admitting: Family Medicine

## 2017-04-01 ENCOUNTER — Ambulatory Visit
Admission: RE | Admit: 2017-04-01 | Discharge: 2017-04-01 | Disposition: A | Discharge status: Home / Self Care | Payer: Managed Care, Other (non HMO) | Source: Ambulatory Visit | Attending: Family Medicine | Admitting: Family Medicine

## 2017-04-01 ENCOUNTER — Telehealth: Payer: Self-pay | Admitting: Family Medicine

## 2017-04-01 DIAGNOSIS — R928 Other abnormal and inconclusive findings on diagnostic imaging of breast: Secondary | ICD-10-CM

## 2017-04-01 LAB — CULTURE, BLOOD (ROUTINE X 2)
CULTURE: NO GROWTH
CULTURE: NO GROWTH
Special Requests: ADEQUATE

## 2017-04-01 NOTE — Progress Notes (Signed)
    Daily Group Progress Note  Program: CD-IOP   04/01/2017 Mercedes Mitchell [id number]  Diagnosis:  No diagnosis found.   Sobriety Date: 5/4  Group Time: 1-2:30pm  Participation Level: Active  Behavioral Response: Appropriate  Type of Therapy: Process Group  Interventions: Supportive  Topic: Process: The first half of group was spent in process. Members shared about the challenges and strengths that they experienced in early recovery. Two of the members who were absent on Monday returned and shared about their struggles. The medical director met with four members during this session to complete medication checks. The session was very intense with members sharing emotions that were deep and risked being vulnerable. Five random drug tests were collected today.  Group Time: 2:30-4pm  Participation Level: Active  Behavioral Response: Sharing  Type of Therapy: Psycho-education Group  Interventions: Art Therapy  Topic: Psycho-ed: Masks; What You Hope to Show and What You're Really Feeling? The second half of group was spent in a psycho-ed. It was a follow-up to the session on Monday. Members were given white paper masks and asked to identify the impressions they tried to present to the world on the outside of the mask and the feelings they really experienced on the inside of their mask. It was a powerful session with evidence that group members expect more of themselves and are harder on themselves than on anyone else. The degree of vulnerability and disclosure provided today was to be applauded and suggested a degree of trust and closeness that is the goal of many group sessions.   Summary: The patient appeared today in group after having been absent on Monday. She explained she had experienced a spike in her temperature and was talking out of her head and she went to the hospital on Saturday morning. She was diagnosed with pneumonia. The patient reported, "I am worn out". Another  member wondered if she had been doing too much and the patient nodded and agreed that she probably hadn't been pacing herself.  The counselor reminded the patient she has come a long way from using a walker and needing assistance to get up out of her chair when she first arrived. The pneumonia was her speed bump, but her shining moment was that her labs are much improved so she is pleased that her body and liver are healing themselves. The patient reported she has gained weight and is now up to 107 lbs. This is very different from when she was less than 90 lbs. when she was first hospitalized. The patient was attentive and provided insightful feedback as she listened to her fellow group members share about their 'masks'. She had not been here on Monday when they were given the assignment, but she found the presentations very compelling. The patient presented as attentive and oriented and seems to have rebounded from the pneumonia. We will continue to follow closely in the days ahead.   UDS collected: Yes Results: pending  AA/NA attended?: YesMonday, Tuesday and Wednesday  Sponsor?: No   Brandon Melnick, LCAS 04/01/2017 10:18 AM

## 2017-04-01 NOTE — Progress Notes (Signed)
    Daily Group Progress Note  Program: CD-IOP   04/01/2017 [name] [id number]  Diagnosis:  Alcohol use disorder, severe, dependence ([place])  Cannabis abuse, episodic use   Sobriety Date: 5/4  Group Time: 1-2:30  Participation Level: Active  Behavioral Response: Appropriate and Sharing  Type of Therapy: Process Group  Interventions: CBT, Solution Focused, Strength-based and Supportive  Topic: Patients were active and engaged in process session. Counselor led processing time in which pts discussed their struggles and successes in early recovery since last group. A UDS was collected from one group member who was absent last group. Pts also shared in a topical discussion related to AA/Recovery. Discussion was open and many shared deeply about their emotional triggers to use. One member admitted he had secretly called his dealer to plan a return to use but instead chose not to use based on the feedback he got from a group member.     Group Time: 2:30-4  Participation Level: Active  Behavioral Response: Appropriate and Sharing  Type of Therapy: Psycho-education Group  Interventions: CBT and Other: Relapse Prevention  Topic: Patients were active and engaged in psychoeducation session. Counselor led psychoeducation time in which the topic of Relapse Prevention was discussed. Counselor led pts through understanding the links in the chain of relapse: "Trigger-Thoughts-Cravings-Use". Pts verbalized understanding and were able to connect the lesson to their own personal stories of relapse and triggers.   Summary: Pt reported she had slept great last night and had not attended any AA since yesterday. She reported she had a good PT session this morning and was told she is making good progress. Pt discussed a "speedbump" that her husband is "grumpy" and is not helping her w/ her goal of getting out more and having fun. Pt stated she planned to go to [facility] to visit her son  in a few weeks but her husband advised her not to go since it may be triggering. Pt admitted she had no desire to use w/ her sons (they have hx of drinking and smoking weed together), but she felt that perhaps her husband is agitated and wants to drink. Counselor helped pt gain insight into her communication patterns w/ her husband and how to improve them. Pt listened actively during psychoeducation session and discussed her topic of "holidays in recovery". Pt was encouraged to reach out for social support more in AA, calling women in recovery and attending more meetings as a better strategy than getting her husband to "be nicer".    UDS collected: No Results: negative  AA/NA attended?: No  Sponsor?: No   Wes Kacelyn Rowzee, LPCA LCASA 04/01/2017 10:33 AM

## 2017-04-01 NOTE — Telephone Encounter (Signed)
Spoke with Dr Simone Curia is evaluating Ms Ringle at this time and concerned about small left breast nodule very suspicious for breast cancer. Ms Pomerleau is on Lovenox and has Hx of liver disease. Dr Jetta Lout is concerned about high risk of bleeding if she performs a breast Bx at this time. She has noted several ecchymotic areas on pt and wants to know if Lovanox can be held once next week to perform breast Bx.  She was just Dx with DVT involving mid femoral vein on 02/09/17, the plan is to complete 3 months and discontinue oral anticoagulation around 05/12/17.  Risk vs benefits need to be discussed with pt, she has a f/u appt this coming Monday, so we can discuss options. Dr Jetta Lout is planning on performing Bx on 04/05/17 regardless of Lovanox status.  Meghan Tiemann [place], MD

## 2017-04-03 NOTE — Progress Notes (Signed)
HPI:   Mercedes Mitchell is a 56 y.o. female, who is here today to follow on recent hospitalization..   She presented to the ER on 03/26/17 because MS changes and fever. She was discharged on 03/28/17. Found elevated ammonia. CXR patchy infiltrate concerning for pneumonia. She was started on IV abx (Rocephin and Azithromycin) and transition to Levaquin.  Hospital acquired pneumonia:   She completed Levaquin 750 mg daily. She denies fever, chills, chest pain, cough, dyspnea, or wheezing.  BCx x 2 no growth 5 days (final 04/01/17)  Lab Results  Component Value Date   WBC 11.0 (H) 03/28/2017   HGB 10.6 (L) 03/28/2017   HCT 31.7 (L) 03/28/2017   MCV 103.6 (H) 03/28/2017   PLT 192 41/28/7867    Acute metabolic encephalopathy/elevated ammonia: Lactulose was increased, she is taking it bid, she is having 2-3 "firmed" bowel movements daily.  Outpatient PT, twice per week. She feels like she is getting stronger. She doesn't need assistance for walking. She is also driving.  Alcohol liver cirrhosis with ascites:  Nex appt with liver transplant center is on 04/05/17. She is back to Spironolactone 200 mg daily and Lasix 80 mg daily. She is also on Prednisone 40 mg daily ,Thiamine 100 mg daily,and Folate.  She has been sober for 101 days.  Lab Results  Component Value Date   ALT 35 03/28/2017   AST 40 03/28/2017   ALKPHOS 142 (H) 03/28/2017   BILITOT 1.7 (H) 03/28/2017    Right femoral DVT: Lovenox increased to 1.5 mg/K/day, 70 mg daily.  She is concerned about new ecchymoses on abdominal wall, denies tenderness or mass. She also has generalized ecchymosis on upper and lower extremities and superficial skin tears. Some lesions she noted after minor trauma, blood work , tape, and after mammogram.  HSV labial:   Completed 5 days of  Acyclovir 200 mg 5 times per day. Lesion resolved.  She is still taking Nystatin for oral thrush. She has not noted odynophagia or  oral lesions.   Breast nodule:  Recently she had mammogram, found nodule in left breast suspicious for breast cancer. She has appt for Bx on 04/05/17. According to patient, she was already instructed to hold her Lovenox tonight and to resume it tomorrow night.  She states that she fell "down" when she found out about possibility of breast cancer, she is afraid liver transplant will be delayed as a result. She has history of depression and anxiety, otherwise stable. Currently she is on Seroquel, which is also helping her sleep.  Sacral ulcer has healed and she is not linger going to wound clinic.   Review of Systems  Constitutional: Positive for fatigue (improving). Negative for activity change, appetite change and fever.  HENT: Negative for nosebleeds, sore throat and trouble swallowing.   Eyes: Negative for redness and visual disturbance.  Respiratory: Negative for cough, shortness of breath and wheezing.   Cardiovascular: Negative for chest pain, palpitations and leg swelling.  Gastrointestinal: Negative for abdominal pain, nausea and vomiting.       Negative for changes in bowel habits.  Genitourinary: Negative for decreased urine volume, dysuria and hematuria.  Musculoskeletal: Positive for gait problem. Negative for myalgias.  Skin: Negative for rash and wound.  Neurological: Positive for light-headedness (upon standing up fast, last a few seconds). Negative for syncope, weakness and headaches.  Hematological: Negative for adenopathy. Bruises/bleeds easily.  Psychiatric/Behavioral: Positive for sleep disturbance. Negative for confusion and hallucinations. The patient  is nervous/anxious.       Current Outpatient Prescriptions on File Prior to Visit  Medication Sig Dispense Refill  . camphor-menthol (SARNA) lotion Apply topically as needed for itching. 222 mL 0  . enoxaparin (LOVENOX) 60 MG/0.6ML injection Inject 0.7 mLs (70 mg total) into the skin daily. 30 Syringe 1  . feeding  supplement, ENSURE ENLIVE, (ENSURE ENLIVE) LIQD Take 237 mLs by mouth 3 (three) times daily between meals. 90 Bottle 0  . folic acid (FOLVITE) 1 MG tablet Take 1 tablet (1 mg total) by mouth daily. 100 tablet 5  . furosemide (LASIX) 20 MG tablet Take 4 tablets (80 mg total) by mouth daily.    . hydrocerin (EUCERIN) CREA Apply 1 application topically daily. Apply to bilateral legs once daily 500 g 1  . lactulose (CHRONULAC) 10 GM/15ML solution Take 30 mLs (20 g total) by mouth 2 (two) times daily. 1800 mL 1  . Multiple Vitamin (MULTIVITAMIN WITH MINERALS) TABS tablet Take 1 tablet by mouth daily. 30 tablet 0  . ondansetron (ZOFRAN) 4 MG tablet Take 1 tablet (4 mg total) by mouth every 8 (eight) hours as needed for nausea or vomiting. 20 tablet 0  . pantoprazole (PROTONIX) 40 MG tablet Take 1 tablet (40 mg total) by mouth 2 (two) times daily. 60 tablet 1  . prednisoLONE (PRELONE) 15 MG/5ML SOLN Take 13.3 mLs (40 mg total) by mouth daily before breakfast. Continue until tapered by GI. 450 mL 0  . QUEtiapine (SEROQUEL) 25 MG tablet Take 1 tablet (25 mg total) by mouth at bedtime. May take 2 tablets if needed (Patient taking differently: Take 25 mg by mouth at bedtime as needed (sleep). May take 2 tablets if needed) 60 tablet 2  . spironolactone (ALDACTONE) 50 MG tablet Take 4 tablets (200 mg total) by mouth daily.    Marland Kitchen thiamine 100 MG tablet Take 1 tablet (100 mg total) by mouth daily. 30 tablet 0  . Adhesive Bandages PADS 1 application by Does not apply route 3 (three) times daily as needed. (Patient not taking: Reported on 03/26/2017) 90 each 1  . collagenase (SANTYL) ointment Apply topically daily. Apply to Unstageable pressure injury to coccyx once daily (Patient not taking: Reported on 03/26/2017) 30 g 0  . docusate sodium (COLACE) 100 MG capsule Take 1 capsule (100 mg total) by mouth 2 (two) times daily. (Patient not taking: Reported on 03/26/2017) 10 capsule 0   No current facility-administered  medications on file prior to visit.      Past Medical History:  Diagnosis Date  . 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  paracentesis 2.6 liters 5/4: no SBP.    Marland Kitchen Alcoholism ([place]) 12/2016  . Candida esophagitis ([place])   . Cirrhosis ([place])   . DVT (deep venous thrombosis) ([place])   . Dysphagia 2011  . Esophagitis   . Hepatic steatosis   . Hiatal hernia   . Hiatal hernia   . Macrocytic anemia 12/2016  . Malnutrition ([place]) 12/2016  . Panic type anxiety neurosis 2011  . Portal hypertension ([place])   . Portal hypertensive gastropathy ([facility])    No Known Allergies  Social History   Social History  . Marital status: Married    Spouse name: N/A  . Number of children: N/A  . Years of education: N/A   Occupational History  . Unemployed    Social History Main Topics  . Smoking status: Former Smoker  Types: Cigarettes  . Smokeless tobacco: Never Used     Comment: Reports she quit "about 6 months ago"  . Alcohol use Yes     Comment: Last drink: 36 days ago   . Drug use: No     Comment: Last use Cocaine 11/17, reports last use "over a year", [facility] 2/18, reports "a year" ago  . Sexual activity: Not Asked   Other Topics Concern  . None   Social History Narrative   Married, children, not working    Vitals:   04/04/17 0728  BP: 120/78  Pulse: 100  Resp: 12  SpO2: 98%   Body mass index is 17.43 kg/m.   Physical Exam  Nursing note and vitals reviewed. Constitutional: She is oriented to person, place, and time. She appears well-developed. No distress.  HENT:  Head: Atraumatic.  Mouth/Throat: Oropharynx is clear and moist and mucous membranes are normal.  Eyes: Pupils are equal, round, and reactive to light. Conjunctivae are normal.  Cardiovascular: Normal rate and regular rhythm.   No murmur heard. Pulses:      Dorsalis pedis pulses are 2+ on the right side, and 2+ on the left side.  Respiratory:  Effort normal and breath sounds normal. No respiratory distress.  GI: Soft. She exhibits ascites. She exhibits no mass. There is no tenderness.  Musculoskeletal: She exhibits no edema.  Lymphadenopathy:    She has no cervical adenopathy.  Neurological: She is alert and oriented to person, place, and time. She has normal strength.  Stable wide based gait with no assistance.  Skin: Skin is warm. Ecchymosis noted. No rash noted. No erythema.  Skin tears on left shoulder, forearm, and a couple on LE's. Ecchymosis scattered on upper and lower extremities as well as abdomen. No hematoma or abdominal wall appreciated. On left knee with superficial laceration and mild surrounding erythema and local heat. No tenderness or drainage. Sacral ulcer healed, mild erythema,no tenderness.  Psychiatric: Her mood appears anxious.  Appropriately groomed, good eye contact.    ASSESSMENT AND PLAN:   Ms Shiela was seen today for hospitalization follow-up.  Diagnoses and all orders for this visit:  Acute deep vein thrombosis (DVT) of right femoral vein ([facility])  We dicussed risk of bleeding as well as thrombotic event related to liver disease. Currently on Lovenox, we discussed side effects and current recommendations in regard to first DVT treatment. We are planning on completing 3 months of treatment. Instructed about warning signs.  Community acquired pneumonia of right middle lobe of lung ([place])  Resolved. No further recommendations at this time.  Alcoholic cirrhosis of liver with ascites ([place])  She has appointment with liver transplant team tomorrow,so she can discuss her concerns in regard to new finding (breast nodule) and how this would affect process. She has been sober for 101 days now,congratulated.  She is attending [place] meetings. INR today 1.1. Platelets in normal range on 03/28/2017.  -     POC INR  Breast nodule  She will keep appointment tomorrow for biopsy. We discussed possible  complications of the procedure given her history of liver disease as well as being on anticoagulation.  She understands risks and benefits of holding Lovenox. She will hold dose tonight and resume tomorrow after procedure.  Multiple skin tears  Related to minor trauma and procedures she has had recently. Recommend applying topical abx on those lesions that have mild erythema. Keep lesions clean with soap and water. Keep skin moisturized. Monitor for signs of infection.  -  mupirocin cream (BACTROBAN) 2 %; Apply 1 application topically 2 (two) times daily.  Sacral decubitus ulcer, stage III ([place])  Now just mild residual erythema. Continue avoiding frequent pressure and monitor for worsening symptoms.     Shaquon Gropp G. [place], MD  [facility]. [place] office.

## 2017-04-04 ENCOUNTER — Other Ambulatory Visit (HOSPITAL_COMMUNITY): Payer: 59 | Admitting: Psychology

## 2017-04-04 ENCOUNTER — Telehealth: Payer: Self-pay | Admitting: General Practice

## 2017-04-04 ENCOUNTER — Ambulatory Visit (INDEPENDENT_AMBULATORY_CARE_PROVIDER_SITE_OTHER): Payer: Managed Care, Other (non HMO) | Admitting: Family Medicine

## 2017-04-04 ENCOUNTER — Ambulatory Visit: Payer: Self-pay

## 2017-04-04 ENCOUNTER — Encounter: Payer: Self-pay | Admitting: Family Medicine

## 2017-04-04 VITALS — BP 120/78 | HR 100 | Resp 12 | Ht 66.0 in | Wt 108.0 lb

## 2017-04-04 DIAGNOSIS — J181 Lobar pneumonia, unspecified organism: Secondary | ICD-10-CM

## 2017-04-04 DIAGNOSIS — L89153 Pressure ulcer of sacral region, stage 3: Secondary | ICD-10-CM

## 2017-04-04 DIAGNOSIS — T148XXA Other injury of unspecified body region, initial encounter: Secondary | ICD-10-CM | POA: Diagnosis not present

## 2017-04-04 DIAGNOSIS — F1020 Alcohol dependence, uncomplicated: Secondary | ICD-10-CM | POA: Diagnosis not present

## 2017-04-04 DIAGNOSIS — N63 Unspecified lump in unspecified breast: Secondary | ICD-10-CM

## 2017-04-04 DIAGNOSIS — J189 Pneumonia, unspecified organism: Secondary | ICD-10-CM

## 2017-04-04 DIAGNOSIS — K7031 Alcoholic cirrhosis of liver with ascites: Secondary | ICD-10-CM

## 2017-04-04 DIAGNOSIS — I82411 Acute embolism and thrombosis of right femoral vein: Secondary | ICD-10-CM | POA: Diagnosis not present

## 2017-04-04 MED ORDER — MUPIROCIN CALCIUM 2 % EX CREA: 1.0000 "application " | TOPICAL_CREAM | Freq: Two times a day (BID) | CUTANEOUS | 0 refills | Status: DC

## 2017-04-04 NOTE — Telephone Encounter (Signed)
Dr. Martinique,  Patient's INR this morning is 1.1  Villa Herb, RN

## 2017-04-04 NOTE — Patient Instructions (Addendum)
A few things to remember from today's visit:   Acute deep vein thrombosis (DVT) of right femoral vein (Mercedes Mitchell)  Community acquired pneumonia of right middle lobe of lung (Mercedes Mitchell)  Alcoholic cirrhosis of liver with ascites (Mercedes Mitchell) - Plan: POC INR  Breast nodule  Lovenox until 05/13/17. Hold dose tonight and resume tomorrow after procedure.  Lactulose 2 times daily, 2-3 soft stools daily is the ideal.  Fall precaution.  Mupirocin on erythematous skin tears.   Please be sure medication list is accurate. If a new problem present, please set up appointment sooner than planned today.

## 2017-04-05 ENCOUNTER — Telehealth (HOSPITAL_COMMUNITY): Payer: Self-pay | Admitting: Psychology

## 2017-04-05 ENCOUNTER — Ambulatory Visit: Payer: Managed Care, Other (non HMO) | Admitting: Physical Therapy

## 2017-04-05 ENCOUNTER — Other Ambulatory Visit: Payer: Self-pay | Admitting: Family Medicine

## 2017-04-05 ENCOUNTER — Ambulatory Visit
Admission: RE | Admit: 2017-04-05 | Discharge: 2017-04-05 | Disposition: A | Discharge status: Home / Self Care | Payer: Managed Care, Other (non HMO) | Source: Ambulatory Visit | Attending: Family Medicine | Admitting: Family Medicine

## 2017-04-05 DIAGNOSIS — R262 Difficulty in walking, not elsewhere classified: Secondary | ICD-10-CM | POA: Diagnosis not present

## 2017-04-05 DIAGNOSIS — R928 Other abnormal and inconclusive findings on diagnostic imaging of breast: Secondary | ICD-10-CM

## 2017-04-05 DIAGNOSIS — R293 Abnormal posture: Secondary | ICD-10-CM

## 2017-04-05 DIAGNOSIS — M6281 Muscle weakness (generalized): Secondary | ICD-10-CM

## 2017-04-05 NOTE — Therapy (Signed)
[facility] [address], [place], [place], [address] Phone: [phone]   Fax:  [phone]  Physical Therapy Treatment  Patient Details  Name: Mercedes Mitchell MRN: [id number] Date of Birth: 28-May-1961 Referring Provider: [place], Betty G, MD  Encounter Date: 04/05/2017      PT End of Session - 04/05/17 0927    Visit Number 3   Date for PT Re-Evaluation 05/20/17   PT Start Time 0845   PT Stop Time 0927   PT Time Calculation (min) 42 min   Activity Tolerance Patient tolerated treatment well      Past Medical History:  Diagnosis Date  . 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  paracentesis 2.6 liters 5/4: no SBP.    Marland Kitchen Alcoholism ([place]) 12/2016  . Candida esophagitis ([place])   . Cirrhosis ([place])   . DVT (deep venous thrombosis) ([place])   . Dysphagia 2011  . Esophagitis   . Hepatic steatosis   . Hiatal hernia   . Hiatal hernia   . Macrocytic anemia 12/2016  . Malnutrition ([place]) 12/2016  . Panic type anxiety neurosis 2011  . Portal hypertension ([place])   . Portal hypertensive gastropathy [facility])     Past Surgical History:  Procedure Laterality Date  . ESOPHAGOGASTRODUODENOSCOPY N/A 12/25/2016   Procedure: ESOPHAGOGASTRODUODENOSCOPY (EGD);  Surgeon: Jerene Bears, MD;  Location: [facility];  Service: Endoscopy;  Laterality: N/A;  . IR PARACENTESIS  02/02/2017  . IR TRANSCATHETER BX  02/02/2017  . IR US GUIDE VASC ACCESS RIGHT  02/02/2017  . IR VENOGRAM HEPATIC W HEMODYNAMIC EVALUATION  02/02/2017    There were no vitals filed for this visit.      Subjective Assessment - 04/05/17 0849    Subjective My neck is sore, had weird dreams last night.  Having a breast biopsy today.  States her endurance is better this week.     Currently in Pain? Yes   Pain Score 2    Pain Location Neck   Pain Orientation Right   Pain Type Acute pain                          [facility] Adult PT Treatment/Exercise - 04/05/17 0001      Neuro Re-ed    Neuro Re-ed Details  dynamic reaching, head turns, body turns on stable and unstable surface     Knee/Hip Exercises: Stretches   Active Hamstring Stretch Right;Left;3 reps  on 2nd step    Other Knee/Hip Stretches doorway psoas stretch 3x/5 bil     Knee/Hip Exercises: Aerobic   Nustep L1 11 min  while discussing status     Knee/Hip Exercises: Standing   Heel Raises Both;15 reps   Hip Abduction Stengthening;Right;Left;10 reps   Abduction Limitations red band   Hip Extension Stengthening;10 reps   Extension Limitations red band   Forward Step Up Right;Left;10 reps;Hand Hold: 2;Step Height: 6"   Rebounder weight shift on black foam      Knee/Hip Exercises: Seated   Clamshell with TheraBand Green   Knee/Hip Flexion 20x   Other Seated Knee/Hip Exercises foam roll push down 15x   Sit to Sand 10 reps  chair + black foam     Shoulder Exercises: Standing   Extension Strengthening;Both;15 reps;Theraband  while stand on black foam   Theraband Level (Shoulder Extension) Level 2 (Red)  PT Short Term Goals - 04/05/17 1726      PT SHORT TERM GOAL #1   Title independent with initial HEP   Time 4   Period Weeks   Status On-going     PT SHORT TERM GOAL #2   Title pt reports feeling 25% more steady when walking in the community   Time 4   Period Weeks   Status On-going           PT Long Term Goals - 04/05/17 1726      PT LONG TERM GOAL #1   Title able to get up and down from the floor easier   Time 8   Period Weeks   Status On-going     PT LONG TERM GOAL #2   Title independent with advanced HEP   Time 8   Period Weeks   Status On-going     PT LONG TERM GOAL #3   Title TUG < or = to 12 sec   Time 8   Period Weeks   Status On-going     PT LONG TERM GOAL #4   Title Berg > or = to 49/56   Time 8   Period Weeks   Status  On-going     PT LONG TERM GOAL #5   Title able to demonstrate greater variation in gait speed based on DGI, due to improved stability    Period Weeks   Status On-going               Plan - 04/05/17 1721    Clinical Impression Statement The patient is able to perform sit to stand with greater ease today.  She has some difficulty focusing on task at hand but patient may be distracted by concerns about biopsy to be done later today.  Wide based gait for stability.  Close supervision for safety but no loss of balance.     Rehab Potential Good   PT Duration 8 weeks   PT Treatment/Interventions ADLs/Self Care Home Management;Cryotherapy;Electrical Stimulation;Moist Heat;Therapeutic activities;Therapeutic exercise;Balance training;Neuromuscular re-education;Gait training;Stair training;Manual techniques;Patient/family education;Taping;Dry needling;Passive range of motion;Functional mobility training   PT Next Visit Plan overall strength UE and LE, gait, stairs, balance      Patient will benefit from skilled therapeutic intervention in order to improve the following deficits and impairments:  Abnormal gait, Difficulty walking, Decreased strength, Postural dysfunction, Decreased coordination, Decreased balance  Visit Diagnosis: Difficulty in walking, not elsewhere classified  Muscle weakness (generalized)  Abnormal posture     Problem List Patient Active Problem List   Diagnosis Date Noted  . Sepsis ([place]) 03/26/2017  . Community acquired pneumonia of right middle lobe of lung ([place])   . Sacral decubitus ulcer, stage III ([place]) 02/18/2017  . Abdominal pain 02/10/2017  . Acute deep vein thrombosis (DVT) of right femoral vein ([place]) 02/10/2017  . Hyponatremia 02/10/2017  . DVT (deep venous thrombosis) ([place]) 02/10/2017  . Alcoholic cirrhosis of liver with ascites ([place]) 01/12/2017  . GI bleeding 01/12/2017  . Current every day smoker 01/12/2017  . Ascites   . Coffee ground emesis    . Acute esophagitis   . Protein-calorie malnutrition, severe 12/24/2016  . Acute alcoholic hepatitis 26/71/2458  . ETOH abuse 12/23/2016  . Macrocytic anemia 12/21/2016  . 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 DYSPHAGIA 03/25/2010   Ruben Im, PT 04/05/17 5:28 PM Phone: [phone] Fax: [phone]  Alvera Singh 04/05/2017, 5:27 PM  [facility] [address]  [place], [place], [address] Phone: [phone]   Fax:  [phone]  Name: Saarah Dewing MRN: [id number] Date of Birth: Mar 21, 1961

## 2017-04-06 ENCOUNTER — Telehealth: Payer: Self-pay | Admitting: *Deleted

## 2017-04-06 ENCOUNTER — Other Ambulatory Visit (HOSPITAL_COMMUNITY): Payer: 59 | Admitting: Psychology

## 2017-04-06 ENCOUNTER — Telehealth (HOSPITAL_COMMUNITY): Payer: Self-pay | Admitting: Psychology

## 2017-04-06 DIAGNOSIS — F1020 Alcohol dependence, uncomplicated: Secondary | ICD-10-CM

## 2017-04-06 DIAGNOSIS — F1210 Cannabis abuse, uncomplicated: Secondary | ICD-10-CM

## 2017-04-06 DIAGNOSIS — K7030 Alcoholic cirrhosis of liver without ascites: Secondary | ICD-10-CM

## 2017-04-06 NOTE — Telephone Encounter (Signed)
Dr Hilarie Fredrickson, I got a patient request for refill on prednisolone. However, it appears since we saw her in office, she has been hospitalized again and her dose on discharge instructions indicate that she is taking 13.3 mls (40 mg) total of prenisolone until tapered by GI. Interestingly, when I look at the date this was written though, it was done on 02/05/17 which is PRIOR to the date we saw her in clinic. Our office had asked her to be on 10 ml of prednisolone. To complicate matters, she has also seen Dawn Drazek recently but I am unable to see where she has dicated any chart notes yet so not sure if she is changing anything. Any advice?

## 2017-04-07 ENCOUNTER — Other Ambulatory Visit (HOSPITAL_COMMUNITY): Payer: 59 | Admitting: Psychology

## 2017-04-07 ENCOUNTER — Encounter (HOSPITAL_COMMUNITY): Payer: Self-pay | Admitting: Psychology

## 2017-04-07 DIAGNOSIS — F1020 Alcohol dependence, uncomplicated: Secondary | ICD-10-CM

## 2017-04-07 MED ORDER — PREDNISOLONE 15 MG/5ML PO SOLN: ORAL | 0 refills | Status: DC

## 2017-04-07 NOTE — Telephone Encounter (Signed)
Dr Hilarie Fredrickson- Patient states she is on 15 mg on prednisolone at this point (our last note states to stay on 30 mg so Im not sure if she tapered herself or if another provider tapered her?).   I also talked to [facility] She states that she did not change patient's prednisolone dosage at all and will leaving dosing changes etc to you as to keep "too many hands from being in the pot." However, her labs are so good, she wonders if she may even be able to taper prednisolone more....  Thoughts...Marland Kitchen? Patient has upcoming appt with [place] on 05/02/17.

## 2017-04-07 NOTE — Telephone Encounter (Signed)
I have left a voicemail for patient to call back. New rx sent to patient's pharmacy for prednisolone taper. Order placed in EPIC for hepatic panel every 2 weeks while tapering prednisolone.

## 2017-04-07 NOTE — Telephone Encounter (Signed)
Taper prednisolone to 10 mg x 2 weeks and then 5 mg until followup with me Check hep function panel every 2 weeks during this taper Thanks

## 2017-04-07 NOTE — Telephone Encounter (Signed)
Please attempt to contact Mercedes Mitchell to determine what dose of prednisolone she should be on at this point. She has had more recent contact with [facility] liver clinic than with me

## 2017-04-07 NOTE — Progress Notes (Signed)
Patient ID: Mercedes Mitchell, female   DOB: 09-13-1960, 56 y.o.   MRN: [id number] Counselor entered PHQ and GAD scores from Monday 8/13.

## 2017-04-07 NOTE — Telephone Encounter (Signed)
Patient calling Dottie back to let her know medication is 15mg , and she's sorry she had to hang up call.

## 2017-04-07 NOTE — Progress Notes (Signed)
    Daily Group Progress Note  Program: CD-IOP   04/07/2017 [name] [id number]  Diagnosis:  Alcohol use disorder, severe, dependence ([place])  Cannabis abuse, episodic use   Sobriety Date: 5/4  Group Time: 1-2:30  Participation Level: Active  Behavioral Response: Appropriate and Sharing  Type of Therapy: Process Group  Interventions: CBT, Strength-based and Supportive  Topic: Patients were active and engaged in process session today. They were encouraged to share about their successes and struggles in early recovery. Counselors helped pts identify specific skills and tools they have learned in [place] to handle triggers and cravings to use. Pts discussed their attendance in self-help groups and their sobriety. One new member was present today and shared openly. Some UDS were collected from members and some results from previous tests were returned. Some pts met w/ program director [name] for medication management and f/u.     Group Time: 2:30-4  Participation Level: Active  Behavioral Response: Appropriate and Sharing  Type of Therapy: Psycho-education Group  Interventions: CBT and Other: Spirituality  Topic: Patients were active and engaged in psychoeducation session today. Pts were led by counselors and a Psychologist, forensic Messenger who led an experiential exercise in "being honest". Pts were invited to go around the room 1 by 1 and share a way that they "pretend" in group to make situations appear more positive than they actually are. Pts shared openly and appeared to grow in their trust and comfort w/ each other and w/ group facilitators.    Summary: Patient was active and engaged in session. She reported attending 1 AA meeting that morning. She was happy to report that she "no longer qualifies for needing a new liver" since her health is quickly bouncing back, according to her doctors. She reports her husband attended the [place] meeting w/ her. She reports  she is continuing to eat more and sustaining her weight though she wants to gain more and is being advised to eat low sodium protein as much as possible. Pt reported on her nervousness about continuing to wait for results from her breast biopsy which are still pending. Pt shared w/ marked sincerity during "I pretend" exercise saying she is worried about he relationship w/ her husband since it is under stress. UDS results were returned w/ reflected a low dose of Tramadol which was administered when pt was in hospital for pneumonia on Monday 8/13. This will not count as a lapse or drug use since pt was unaware of it being given to her upon entry into hospital.    UDS collected: No Results: positive for narcotics  AA/NA attended?: YesThursday  Sponsor?: No   Youlanda Roys, LPCA [place] 04/07/2017 11:37 AM

## 2017-04-07 NOTE — Telephone Encounter (Signed)
I have spoken to Mercedes Mitchell to advise of Dr Vena Rua recommendations and to ask that she come for labs every 2 wekes. She verbalizes understanding.

## 2017-04-08 ENCOUNTER — Encounter (HOSPITAL_COMMUNITY): Payer: Self-pay | Admitting: Psychology

## 2017-04-08 NOTE — Progress Notes (Signed)
    Daily Group Progress Note  Program: CD-IOP   04/08/2017 [name] [id number]  Diagnosis:  No diagnosis found.   Sobriety Date: 5/4  Group Time: 1-2:30pm  Participation Level: Active  Behavioral Response: Appropriate  Type of Therapy: Process Group  Interventions: Supportive  Topic: Process: The first half of group was spent in process. Members shared about their day, identifying 'speed bumps' and 'shining moments' experienced since we last met. Members talked about their struggles, including irritability, clumsiness and lethargy. One member lamented the difficulty getting out of bed now that she has no pills to take. Group members shared their experiences with each other and provided helpful feedback. One member was absent today.  Group Time: 2:30-4pm  Participation Level: Active  Behavioral Response: Sharing  Type of Therapy: Psycho-education Group  Interventions: Strength-based  Topic: Psycho-ed: The second half of group was spent in a psycho-ed. Members were asked to pick out a popsicle stick that covered a topic or issue they wanted to discuss. Upon gathering the sticks, members were asked to pass them to the left and each got a new popsicle stick. Members shared how they viewed the word or phrase written on their stick and how it related to their new lives in early recovery? The topics generated intense conversation and revealed some dysfunctional perspectives and thinking. The session ended with members sharing about their plans for the upcoming weekend.  Summary: The patient reported her shining moment was 'no cancer'. She had been waiting to hear the results of the needle biopsy conducted on Tuesday. It had been ordered after a mammogram revealed some concerns. She was very pleased to share this news and her fellow group members applauded. The patient's youngest son also drove in from [place] and surprised her. The patient identified her speed bump as an  argument with her husband, [name]. He had gotten on her about her spending. The patient noted that he has been very stressed out at work and worried about her. He also went to the orthopedic and found out he had a hairline fracture of his right shoulder. She tried to justify or minimize the argument and his hurtful comments. In the psycho-ed, the patient identified her word as 'control'. The patient admitted she is a control-freak, but noted that since she has been sick, "I have had to realize that I don't have control". She explained that she tends to consider her way the only way and can be very critical of her family at times. "I am difficult to please" she reported. The patient shared some examples that were quite different than the way she presents, and we know her in group. The patient shared about her life and feelings today more so than in previous sessions. She responded well to this intervention.   UDS collected: No Results:  AA/NA attended?: No  Sponsor?: No   Brandon Melnick, LCAS 04/08/2017 9:12 AM

## 2017-04-09 ENCOUNTER — Other Ambulatory Visit: Payer: Self-pay | Admitting: Family Medicine

## 2017-04-09 DIAGNOSIS — B370 Candidal stomatitis: Secondary | ICD-10-CM

## 2017-04-10 ENCOUNTER — Encounter (HOSPITAL_COMMUNITY): Payer: Self-pay | Admitting: Psychology

## 2017-04-10 NOTE — Progress Notes (Signed)
    Daily Group Progress Note  Program: CD-IOP   04/10/2017 [name] [id number]  Diagnosis:  Alcohol use disorder, severe, dependence ([place])   Sobriety Date: 5/4  Group Time: 1-2:30pm  Participation Level: Active  Behavioral Response: Sharing  Type of Therapy: Process Group  Interventions: Supportive  Topic: Process: the first half of group was spent in process. Members shared bout the past weekend and any struggles or challenges they may have faced. They identified their 'shining moment' and any 'speed bumps'. The medical director met with two group members for med checks and one member for her discharge. Four random drug tests were collected.   Group Time: 2:30-4pm  Participation Level: Active  Behavioral Response: Appropriate  Type of Therapy: Psycho-education Group  Interventions: Strength-based  Topic: Psycho-Ed: Grounding Script/Internal Triggers/Graduation. The second half of group was spent in a psycho-ed. the counselor read a 'Grounding Script" and members provided feedback upon the conclusion of the reading. A handout was then provided on identifying internal triggers. Members shared their findings and most of the group identified negative feelings or experiences as triggers. This psycho-ed ended as the session neared the closing. A graduation was held and the group members' mother attended. Kind words were shared with the graduating member and she tearfully thanked her fellow group members for their support and encouragement throughout her treatment experience.   Summary: The patient was tearful in the session. When asked to share, she reported she wasn't ready to talk yet and asked that another member start. Near the end of process, the patient was willing to share. She reported that her mammogram had come back with a spot and she was scheduled for a needle-biopsy on tomorrow. She admitted she had tried not to think about it, but keeps coming back to the  possibility of cancer. The patient admitted, "I had a fleeting thought of going to the [name] store and drinking", but I didn't. When asked about her two boys, the patient reported the only people that know about this are her husband and this group. She will not tell her adult children until the outcome is known. She cried, off and on, for most of the process session. In the psycho-ed, the patient reported the scripts that were read, "Helped me stop thinking about the biopsy and I feel less anxious". When asked about her internal triggers, the patient had read the handout and reported negative emotions are big triggers. 'Being afraid, nervous or worried". "When I feel out of control, she explained. I feel scared now". The patient shared kind words of hope and admiration for the graduating member. She responded well to this intervention and the group had offered her their thoughts and strength for the days ahead. These thoughts of drinking were the first ones the patient has had since she entered the program. Utilizing more strategies, like contacting other women in recovery or going to a meeting will be stressed. It seems certain that this patient will have challenges like this one in the future and drinking cannot be viewed as an option.     UDS collected: No Results:   AA/NA attended?: YesMonday, Friday and Sunday  Sponsor?: No   Brandon Melnick, LCAS 04/10/2017 1:13 PM

## 2017-04-11 ENCOUNTER — Encounter: Payer: Self-pay | Admitting: Psychiatry

## 2017-04-11 ENCOUNTER — Other Ambulatory Visit (INDEPENDENT_AMBULATORY_CARE_PROVIDER_SITE_OTHER): Payer: 59 | Admitting: Psychology

## 2017-04-11 ENCOUNTER — Encounter (HOSPITAL_COMMUNITY): Payer: Self-pay | Admitting: Medical

## 2017-04-11 DIAGNOSIS — F10982 Alcohol use, unspecified with alcohol-induced sleep disorder: Secondary | ICD-10-CM | POA: Diagnosis not present

## 2017-04-11 DIAGNOSIS — F1210 Cannabis abuse, uncomplicated: Secondary | ICD-10-CM

## 2017-04-11 DIAGNOSIS — D539 Nutritional anemia, unspecified: Secondary | ICD-10-CM | POA: Diagnosis not present

## 2017-04-11 DIAGNOSIS — F1020 Alcohol dependence, uncomplicated: Secondary | ICD-10-CM | POA: Diagnosis not present

## 2017-04-11 DIAGNOSIS — Z6372 Alcoholism and drug addiction in family: Secondary | ICD-10-CM | POA: Diagnosis not present

## 2017-04-11 DIAGNOSIS — Z87828 Personal history of other (healed) physical injury and trauma: Secondary | ICD-10-CM | POA: Diagnosis not present

## 2017-04-11 DIAGNOSIS — K7031 Alcoholic cirrhosis of liver with ascites: Secondary | ICD-10-CM

## 2017-04-11 DIAGNOSIS — F1421 Cocaine dependence, in remission: Secondary | ICD-10-CM | POA: Diagnosis not present

## 2017-04-11 NOTE — Progress Notes (Signed)
Mercedes Mitchell seen in FU after recent scare over breast CA. Admits she reacted with thought about drinking but upon furher reflection substituted shopping and eating , Says she has been getting o prgressively better physically. Has labs tomorrow to check liver plus PCP check  ROS CONSTITUTIONAL:  NO Fever, Chills, Loss of Sleep, Fatigue, Generalized Weakness and Poor Appetite Gaining wgt and strength  EYES:NO Change in Vision, Double Vision, Blurred Vision and Tearing  EARS/NOSE/THROAT: NO Hearing Loss, Ear Infections, Ear Drainage, Ringing in Ears, Ear Pain, Runny Nose, Nose Bleeding and Dental Problems  HEART: NO Palpitations and Chest Pain  LUNG: NO Wheezing, Shortness of Breath  and Coughing    STOMACH/BOWEL: NO Nausea, Vomiting, Heartburn, Reflux, Change in stool habits, Diarrhea  , Constipation, Change in Color Stool  and Abdominal Pain  GENITOURINARY:NO Incontinence, Pain with Urination, Frequency, Urgency, Urinary Tract Infections and Blood in Urine  SKIN: [place], Dryness, Hyperpigmentation and Pruritus/ Bruises from Lovenox injections only 2 weeeks to go with these  MUSCLE/BONES: NO Back Pain, Joint Pain and Difficulties Walking  NERVOUS SYSTEM:NO Seizure, Headaches, Dizziness, Vertigo, Blurred Vision, Paralysis  , Weakness and Numbness  HORMONES/REPRODUCTIVE:NO Diabetes and Thyroid Problem Tapering off Prednisone BLOOD/LYMPH SYSTEM: Easy bruising/bleeding;NO Swollen glands  IMMUNE:  Steroid Use NO Immune Disorder   Psychiatric- Recovered from breast CA scare    O Mental Status Examination        Appearance: Casually dressed/Lovenox bruising     Alert: Yes     Attention: Good      Cooperative: Yes     Eye Contact: Good     Speech: normal in volume, rate, tone, spontaneous      Psychomotor Activity: Normal      Memory/Concentration: OK      Oriented: person, place,time and situation      Mood: Euthymic;Joyful      Affect: Congruent and Full Range     Thought Processes and  Associations: Goal Directed and Intact      Fund of Knowledge: Fair     Thought Content: NO  Suicidal ideation, Homicidal ideation, Auditory  Visual hallucinations,                                           Delusions and Paranoia, none reported      Insight: Improving      Judgement: Improving       Language: Good  Musculoskeletal: Strength & Muscle Tone: within normal limits Gait & Station: normal Patient leans: N/A  A Severe alcohol dependence in early remission     Cocaine dependemnce severe in early remission     Cannabis abuse in early remission     Alcoholic Cirrhosis stable enzymes improving-doubts she will require Liver transplant       at this time     Benign breast lump     Alcoholic malnutrition improving     Smoking status-?quit  P Continue CD IOP      Discussed her experience with being triggered to drink and reinforced the idea of               "thinking the drink thru"-euphoric recall initially did not stand up to the reality of        her last days of drinking Praised her success at thinking thru and healthier substitutes

## 2017-04-12 ENCOUNTER — Ambulatory Visit: Payer: Managed Care, Other (non HMO) | Admitting: Physical Therapy

## 2017-04-12 ENCOUNTER — Encounter: Payer: Self-pay | Admitting: Physical Therapy

## 2017-04-12 ENCOUNTER — Telehealth: Payer: Self-pay | Admitting: Family Medicine

## 2017-04-12 DIAGNOSIS — M6281 Muscle weakness (generalized): Secondary | ICD-10-CM

## 2017-04-12 DIAGNOSIS — R293 Abnormal posture: Secondary | ICD-10-CM

## 2017-04-12 DIAGNOSIS — R262 Difficulty in walking, not elsewhere classified: Secondary | ICD-10-CM

## 2017-04-12 MED ORDER — CAMPHOR-MENTHOL 0.5-0.5 % EX LOTN
TOPICAL_LOTION | CUTANEOUS | 0 refills | Status: DC | PRN
Start: 2017-04-12 — End: 2017-05-18

## 2017-04-12 NOTE — Therapy (Signed)
[facility] [address], [place], [place], [address] Phone: [phone]   Fax:  [phone]  Physical Therapy Treatment  Patient Details  Name: Mercedes Mitchell MRN: [id number] Date of Birth: [birth year] Referring Provider: Martinique, Betty G, MD  Encounter Date: 04/12/2017      PT End of Session - 04/12/17 1025    Visit Number 4   Date for PT Re-Evaluation 05/20/17   PT Start Time 1020   PT Stop Time 1102   PT Time Calculation (min) 42 min   Activity Tolerance Patient tolerated treatment well      Past Medical History:  Diagnosis Date  . 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  paracentesis 2.6 liters 5/4: no SBP.    Marland Kitchen Alcoholism ([place]) 12/2016  . Candida esophagitis ([place])   . Cirrhosis ([place])   . DVT (deep venous thrombosis) ([place])   . Dysphagia 2011  . Esophagitis   . Hepatic steatosis   . Hiatal hernia   . Hiatal hernia   . Macrocytic anemia 12/2016  . Malnutrition ([place]) 12/2016  . Panic type anxiety neurosis 2011  . Portal hypertension ([place])   . Portal hypertensive gastropathy [facility])     Past Surgical History:  Procedure Laterality Date  . ESOPHAGOGASTRODUODENOSCOPY N/A 12/25/2016   Procedure: ESOPHAGOGASTRODUODENOSCOPY (EGD);  Surgeon: Jerene Bears, MD;  Location: [facility];  Service: Endoscopy;  Laterality: N/A;  . IR PARACENTESIS  02/02/2017  . IR TRANSCATHETER BX  02/02/2017  . IR US GUIDE VASC ACCESS RIGHT  02/02/2017  . IR VENOGRAM HEPATIC W HEMODYNAMIC EVALUATION  02/02/2017    There were no vitals filed for this visit.      Subjective Assessment - 04/12/17 1026    Subjective I have been doing more things at home and have been walking a ton.  I am trying to build muscle and have gained a little weight. Denies pain other than soreness at injection sites.   Limitations Walking   How long can you walk comfortably? 20 minutes    Patient Stated Goals continue getting stronger and work on upper body strength, be able to get up off the floor   Currently in Pain? No/denies                         [facility] Adult PT Treatment/Exercise - 04/12/17 0001      Neuro Re-ed    Neuro Re-ed Details  dynamic reaching, head turns, body turns on stable and unstable surface     Knee/Hip Exercises: Stretches   Active Hamstring Stretch Right;Left;3 reps;20 seconds   Piriformis Stretch Right;Left;3 reps;10 seconds   Gastroc Stretch Both;1 rep;60 seconds   Other Knee/Hip Stretches --     Knee/Hip Exercises: Aerobic   Recumbent Bike L2 x 10 min     Knee/Hip Exercises: Standing   Hip ADduction Strengthening;Both;10 reps   Hip ADduction Limitations red band   Hip Abduction Stengthening;Right;Left;10 reps   Abduction Limitations red band   Hip Extension Stengthening;10 reps   Extension Limitations red band   Forward Step Up Right;Left;10 reps;Step Height: 6";Hand Hold: 0   Rocker Board 2 minutes  both ways   Rebounder weight shift on black foam      Shoulder Exercises: Standing   Row Strengthening;Both;15 reps;Theraband  standing on black foam mat   Theraband Level (Shoulder Row) Level 2 (Red)  Other Standing Exercises diagonals with red band - standing on black foam                  PT Short Term Goals - 04/12/17 1026      PT SHORT TERM GOAL #1   Title independent with initial HEP   Time 4   Period Weeks   Status Achieved     PT SHORT TERM GOAL #2   Title pt reports feeling 25% more steady when walking in the community   Baseline 50%   Time 4   Period Weeks   Status Achieved           PT Long Term Goals - 04/05/17 1726      PT LONG TERM GOAL #1   Title able to get up and down from the floor easier   Time 8   Period Weeks   Status On-going     PT LONG TERM GOAL #2   Title independent with advanced HEP   Time 8   Period Weeks   Status On-going     PT LONG TERM GOAL #3    Title TUG < or = to 12 sec   Time 8   Period Weeks   Status On-going     PT LONG TERM GOAL #4   Title Berg > or = to 49/56   Time 8   Period Weeks   Status On-going     PT LONG TERM GOAL #5   Title able to demonstrate greater variation in gait speed based on DGI, due to improved stability    Period Weeks   Status On-going               Plan - 04/12/17 1028    Clinical Impression Statement Patient feels at least 50% more stable and has met short term goals at this time.  Pt continues to have weakness and instability.  She needed cues to not lock out knees throughout session and some CGA to prevent LOB when on rocker board.  Able to perform step ups with no UE support, but it was a challenge. She wll benefit from skilled therapy to imrprove strength and stability for improved functional movements   PT Treatment/Interventions ADLs/Self Care Home Management;Cryotherapy;Electrical Stimulation;Moist Heat;Therapeutic activities;Therapeutic exercise;Balance training;Neuromuscular re-education;Gait training;Stair training;Manual techniques;Patient/family education;Taping;Dry needling;Passive range of motion;Functional mobility training   Consulted and Agree with Plan of Care Patient      Patient will benefit from skilled therapeutic intervention in order to improve the following deficits and impairments:  Abnormal gait, Difficulty walking, Decreased strength, Postural dysfunction, Decreased coordination, Decreased balance  Visit Diagnosis: Difficulty in walking, not elsewhere classified  Muscle weakness (generalized)  Abnormal posture     Problem List Patient Active Problem List   Diagnosis Date Noted  . Sepsis ([place]) 03/26/2017  . Community acquired pneumonia of right middle lobe of lung ([place])   . Sacral decubitus ulcer, stage III ([place]) 02/18/2017  . Abdominal pain 02/10/2017  . Acute deep vein thrombosis (DVT) of right femoral vein ([place]) 02/10/2017  . Hyponatremia  02/10/2017  . DVT (deep venous thrombosis) ([place]) 02/10/2017  . Alcoholic cirrhosis of liver with ascites ([place]) 01/12/2017  . GI bleeding 01/12/2017  . Current every day smoker 01/12/2017  . Ascites   . Coffee ground emesis   . Acute esophagitis   . Protein-calorie malnutrition, severe 12/24/2016  . Acute alcoholic hepatitis 73/41/9379  . ETOH abuse 12/23/2016  . Macrocytic anemia 12/21/2016  . PANIC  DISORDER,NO AGORAPHOBIA 03/25/2010  . DYSPHAGIA 03/25/2010    Zannie Cove, PT 04/12/2017, 11:05 AM  [facility] [address] [place], [place], [address] Phone: [phone]   Fax:  [phone]  Name: Mercedes Mitchell MRN: [id number] Date of Birth: 09/17/1960

## 2017-04-12 NOTE — Telephone Encounter (Signed)
Please advise 

## 2017-04-12 NOTE — Telephone Encounter (Signed)
Rx sent in

## 2017-04-12 NOTE — Telephone Encounter (Signed)
Refill can be sent to her pharmacy. Dx pruritus, skin. Thanks, BJ

## 2017-04-12 NOTE — Telephone Encounter (Signed)
Pt request refill  camphor-menthol (SARNA) lotion  Pt states this was given this med in the hospital.  Pt states all the meds she is on makes her itch and this cream helps with the itching. Would like to know if Dr Martinique will please refill.     [facility] [address] [place]

## 2017-04-13 ENCOUNTER — Encounter (HOSPITAL_COMMUNITY): Payer: Self-pay | Admitting: Psychology

## 2017-04-13 ENCOUNTER — Other Ambulatory Visit (HOSPITAL_COMMUNITY): Payer: 59 | Admitting: Psychology

## 2017-04-13 DIAGNOSIS — F1020 Alcohol dependence, uncomplicated: Secondary | ICD-10-CM | POA: Diagnosis not present

## 2017-04-13 DIAGNOSIS — F1210 Cannabis abuse, uncomplicated: Secondary | ICD-10-CM

## 2017-04-13 DIAGNOSIS — K7031 Alcoholic cirrhosis of liver with ascites: Secondary | ICD-10-CM
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
## 2017-04-14 ENCOUNTER — Other Ambulatory Visit (HOSPITAL_COMMUNITY): Payer: 59 | Admitting: Psychology

## 2017-04-14 ENCOUNTER — Ambulatory Visit: Payer: Managed Care, Other (non HMO) | Admitting: Physical Therapy

## 2017-04-14 DIAGNOSIS — F1020 Alcohol dependence, uncomplicated: Secondary | ICD-10-CM

## 2017-04-14 DIAGNOSIS — F1210 Cannabis abuse, uncomplicated: Secondary | ICD-10-CM

## 2017-04-14 DIAGNOSIS — F1421 Cocaine dependence, in remission: Secondary | ICD-10-CM

## 2017-04-14 DIAGNOSIS — M6281 Muscle weakness (generalized): Secondary | ICD-10-CM

## 2017-04-14 DIAGNOSIS — R262 Difficulty in walking, not elsewhere classified: Secondary | ICD-10-CM | POA: Diagnosis not present

## 2017-04-14 DIAGNOSIS — R293 Abnormal posture: Secondary | ICD-10-CM

## 2017-04-14 NOTE — Therapy (Signed)
[facility] [address] [place], [place], [address] Phone: [phone]   Fax:  [phone]  Physical Therapy Treatment  Patient Details  Name: Mercedes Mitchell MRN: [id number] Date of Birth: [birth year] Referring Provider: Martinique, Betty G, MD  Encounter Date: 04/14/2017      PT End of Session - 04/14/17 0926    Visit Number 5   Date for PT Re-Evaluation 05/20/17   PT Start Time 0853   PT Stop Time 0935   PT Time Calculation (min) 42 min   Activity Tolerance Patient tolerated treatment well      Past Medical History:  Diagnosis Date  . 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  paracentesis 2.6 liters 5/4: no SBP.    Marland Kitchen Alcoholism ([place]) 12/2016  . Candida esophagitis ([place])   . Cirrhosis ([place])   . DVT (deep venous thrombosis) ([place])   . Dysphagia 2011  . Esophagitis   . Hepatic steatosis   . Hiatal hernia   . Hiatal hernia   . Macrocytic anemia 12/2016  . Malnutrition ([place]) 12/2016  . Panic type anxiety neurosis 2011  . Portal hypertension ([place])   . Portal hypertensive gastropathy [facility])     Past Surgical History:  Procedure Laterality Date  . ESOPHAGOGASTRODUODENOSCOPY N/A 12/25/2016   Procedure: ESOPHAGOGASTRODUODENOSCOPY (EGD);  Surgeon: Jerene Bears, MD;  Location: [facility];  Service: Endoscopy;  Laterality: N/A;  . IR PARACENTESIS  02/02/2017  . IR TRANSCATHETER BX  02/02/2017  . IR US GUIDE VASC ACCESS RIGHT  02/02/2017  . IR VENOGRAM HEPATIC W HEMODYNAMIC EVALUATION  02/02/2017    There were no vitals filed for this visit.      Subjective Assessment - 04/14/17 0855    Subjective Therapy is going well.  Had some foot cramps but that's all.  I have a lot of appointments today.     Currently in Pain? No/denies   Pain Score 0-No pain                    Therapeutic activities:  Stair climbing, rising from a chair,  standing, walking, getting up/down off the floor.     [facility] Adult PT Treatment/Exercise - 04/14/17 0001      Neuro Re-ed    Neuro Re-ed Details  dynamic reaching, head turns, body turns on stable and unstable surface     Lumbar Exercises: Quadruped   Single Arm Raise Right;Left;5 reps   Straight Leg Raise 5 reps   Opposite Arm/Leg Raise Right arm/Left leg;Left arm/Right leg;5 reps   Opposite Arm/Leg Raise Limitations discontinued secondary to patient reports she has scabs on her knees     Knee/Hip Exercises: Stretches   Other Knee/Hip Stretches doorway psoas stretch 3x/5 bil     Knee/Hip Exercises: Aerobic   Recumbent Bike L3 10 min     Knee/Hip Exercises: Standing   Heel Raises Both;15 reps   Hip Abduction Stengthening;Right;Left;10 reps   Abduction Limitations red band   Hip Extension Stengthening;10 reps   Extension Limitations red band   Forward Step Up Right;Left;10 reps;Step Height: 6";Hand Hold: 0   Other Standing Knee Exercises balance on foam with UE motions with weight and beach ball   Other Standing Knee Exercises weight shift on foam     Knee/Hip Exercises: Seated   Other Seated Knee/Hip Exercises foam roll push down 15x   Sit to Sand 10 reps;without  UE support  no compensatory strategies                  PT Short Term Goals - 04/14/17 1914      PT SHORT TERM GOAL #1   Title independent with initial HEP   Status Achieved     PT SHORT TERM GOAL #2   Title pt reports feeling 25% more steady when walking in the community   Status Achieved           PT Long Term Goals - 04/14/17 1914      PT LONG TERM GOAL #1   Title able to get up and down from the floor easier   Time 8   Period Weeks   Status On-going     PT LONG TERM GOAL #2   Title independent with advanced HEP   Time 8   Period Weeks   Status On-going     PT LONG TERM GOAL #3   Title TUG < or = to 12 sec   Time 8   Period Weeks   Status On-going     PT LONG TERM GOAL #4    Title Berg > or = to 49/56   Time 8   Period Weeks   Status On-going     PT LONG TERM GOAL #5   Title able to demonstrate greater variation in gait speed based on DGI, due to improved stability    Time 8   Period Weeks   Status On-going               Plan - 04/14/17 1910    Clinical Impression Statement Improving LE strength noted with greater ease with sit to stand without UEs.  She does continue to hyperextend her knees in standing as a compensatory strategy to stabilize in standing.   She will need continued strengthening in order to meet her goal of getting up and down off the floor independently.     Rehab Potential Good   PT Frequency 2x / week   PT Duration 8 weeks   PT Treatment/Interventions ADLs/Self Care Home Management;Cryotherapy;Electrical Stimulation;Moist Heat;Therapeutic activities;Therapeutic exercise;Balance training;Neuromuscular re-education;Gait training;Stair training;Manual techniques;Patient/family education;Taping;Dry needling;Passive range of motion;Functional mobility training   PT Next Visit Plan overall strength UE and LE, gait, stairs, balance      Patient will benefit from skilled therapeutic intervention in order to improve the following deficits and impairments:  Abnormal gait, Difficulty walking, Decreased strength, Postural dysfunction, Decreased coordination, Decreased balance  Visit Diagnosis: Difficulty in walking, not elsewhere classified  Muscle weakness (generalized)  Abnormal posture     Problem List Patient Active Problem List   Diagnosis Date Noted  . Sepsis ([place]) 03/26/2017  . Community acquired pneumonia of right middle lobe of lung ([place])   . Sacral decubitus ulcer, stage III ([place]) 02/18/2017  . Abdominal pain 02/10/2017  . Acute deep vein thrombosis (DVT) of right femoral vein ([place]) 02/10/2017  . Hyponatremia 02/10/2017  . DVT (deep venous thrombosis) ([place]) 02/10/2017  . Alcoholic cirrhosis of liver with ascites ([place])  01/12/2017  . GI bleeding 01/12/2017  . Current every day smoker 01/12/2017  . Ascites   . Coffee ground emesis   . Acute esophagitis   . Protein-calorie malnutrition, severe 12/24/2016  . Acute alcoholic hepatitis 11/94/1740  . ETOH abuse 12/23/2016  . Macrocytic anemia 12/21/2016  . 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 DYSPHAGIA 03/25/2010   Ruben Im, PT 04/14/17 7:17 PM Phone: [phone] Fax: [phone]  Alvera Singh 04/14/2017, 7:16 PM  [facility] [address], [place], [place], [address] Phone: [phone]   Fax:  [phone]  Name: Mercedes Mitchell MRN: [id number] Date of Birth: 04/21/1961

## 2017-04-15 ENCOUNTER — Encounter (HOSPITAL_COMMUNITY): Payer: Self-pay | Admitting: Psychology

## 2017-04-15 ENCOUNTER — Telehealth: Payer: Self-pay

## 2017-04-15 MED ORDER — PREDNISOLONE 15 MG/5ML PO SOLN: ORAL | 0 refills | Status: DC

## 2017-04-15 NOTE — Telephone Encounter (Signed)
Spoke with Pharmacist Anderson Malta at [facility] and she is aware of new script and directions.

## 2017-04-15 NOTE — Progress Notes (Signed)
    Daily Group Progress Note  Program: CD-IOP   04/15/2017 [name] [id number]  Diagnosis:  Alcohol use disorder, severe, dependence ([place])  Cannabis abuse, episodic use  Cocaine use disorder, severe, in early remission, dependence ([place])   Sobriety Date: 5/4  Group Time: 1-2:30  Participation Level: Active  Behavioral Response: Appropriate and Sharing  Type of Therapy: Process Group  Interventions: CBT, Supportive and Reframing  Topic: Patients were active and engaged for group process session. Counselors used open questions and reflection to help pts share about their challenges and successes in early recovery. Pts shared about their tx goals of sustaining sobriety and growing social support for recovery. One pt shared about his relapse yesterday and apologized to group for attending high and lying about his sobriety. Pts shared openly in a topical discussion that centered on differing recovery and 12 Step topics.      Group Time: 2:30-4  Participation Level: Active  Behavioral Response: Appropriate and Sharing  Type of Therapy: Psycho-education Group  Interventions: CBT and Psychosocial Skills: Shame and doubt  Topic: Patients were active and engaged for group psychoeducation session. Counselors led a psychoeducation lesson on "healing from shame" utilizing a video from [employer] and group discussion. Pts were reflective and thoughtful in their discussion and were able to vocalize significant understanding of the power shame plays in active addiction.    Summary: Pt was active and engaged in session. She continues to sustain sobriety as indicated by negative UDS and increased social support as indicated by regular attendance at [facility]. She reports attended 1 meeting since yesterday. She reports she had a positive night and was able to cook a good meal. She reports her PT is going well and her therapist comments on how much progress she is making. Pt shared  during shame talk briefly about being worried she "will be left alone".    UDS collected: No Results: negative  AA/NA attended?: YesThursday  Sponsor?: No  Wes Swan, LPCA LCASA 04/15/2017 12:09 PM

## 2017-04-15 NOTE — Telephone Encounter (Signed)
Pyrtle pt with alcoholic cirrhosis. Pt was prescribed prednisolone on 04/07/17 to take 10mg  daily for 2 weeks and then 5mg  daily until office visit. Pharmacist called today from [facility] and states pt misread the script and has been taking 92ml daily since 04/07/17 which is 30mg  daily and she has run out of the med now. Pt does not see Dr. Hilarie Fredrickson until 05/02/17. Please advise as DOD.

## 2017-04-15 NOTE — Telephone Encounter (Signed)
New Rx  10 mg daily x 1 week then 5 mg daily x office visit

## 2017-04-17 ENCOUNTER — Encounter (HOSPITAL_COMMUNITY): Payer: Self-pay | Admitting: Psychology
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
## 2017-04-18 ENCOUNTER — Encounter: Payer: Self-pay | Admitting: Psychiatry

## 2017-04-18 ENCOUNTER — Other Ambulatory Visit (HOSPITAL_COMMUNITY): Payer: 59 | Admitting: Psychology

## 2017-04-18 DIAGNOSIS — F1020 Alcohol dependence, uncomplicated: Secondary | ICD-10-CM

## 2017-04-18 DIAGNOSIS — K7031 Alcoholic cirrhosis of liver with ascites: Secondary | ICD-10-CM

## 2017-04-19 ENCOUNTER — Encounter (HOSPITAL_COMMUNITY): Payer: Self-pay | Admitting: Psychology

## 2017-04-19 ENCOUNTER — Ambulatory Visit: Payer: Managed Care, Other (non HMO) | Admitting: Physical Therapy

## 2017-04-19 DIAGNOSIS — M6281 Muscle weakness (generalized): Secondary | ICD-10-CM

## 2017-04-19 DIAGNOSIS — R293 Abnormal posture: Secondary | ICD-10-CM

## 2017-04-19 DIAGNOSIS — R262 Difficulty in walking, not elsewhere classified: Secondary | ICD-10-CM | POA: Diagnosis not present

## 2017-04-19 NOTE — Progress Notes (Signed)
    Daily Group Progress Note  Program: CD-IOP   04/19/2017 [name] [id number]  Diagnosis:  No diagnosis found.   Sobriety Date: 12/24/16  Group Time: 1-2:30pm  Participation Level: Active  Behavioral Response: Sharing  Type of Therapy: Process Group  Interventions: Supportive  Topic: Process: The first half of group was spent in process. Members shared about their challenges and successes over the past weekend. Two new members were present in group today and they introduced themselves and shared about the circumstances that had brought them here. One group member disclosed that he had used over the weekend. His sobriety date was today. He had been using last week and had been taken out of group last Wednesday. His return to use was discussed at length. The medical director met with one of the new group members and a member who will be discharging later this week. Five drug tests were collected.   Group Time: 2:30-4pm  Participation Level: Active  Behavioral Response: Appropriate and Sharing  Type of Therapy: Psycho-education Group  Interventions: Family Systems  Topic: Psycho-Ed: Family Roles in Addicted/Dysfunctional Families. The second half of group was spent in a psycho-ed. A handout identifying the different family roles was provided and a discussion followed. Members took turns reading the description of each of the five most commonly identified roles that family members take on. Members identified themselves and explained how and why they may have undertaken these roles and what they had hoped to achieve. There was good disclosure among group members and the two new group members shared at length about their roles their family of origin.   Summary: The patient reported a good weekend. She and her husband went to a restaurant they used to frequent often and where she drank often. She reported she spoke to the manager who knew her and he had heard about her  illness. They had some dinner and enjoyed the evening, "but I didn't think about drinking even once". The counselor discouraged her from returning to that place of business and suggested she go somewhere else. The patient couldn't really think of a speed bump. In the psycho-ed, the patient reported she had felt inadequate in her childhood. Her mother was drinking and not caring for her and, finally, her oldest sister gained custody of her little sister. There had been lots of chaos in her childhood and very little consistency. It was noted that she might have been controlling because she had admitted being very critical of her family and their inability 'to clean the kitchen and dishes properly'. The patient agreed with this need to control, but couldn't identify where it came from. She was attentive and engaged in the session and expressed concern for the group member who has continued to relapse.    UDS collected: Yes Results: pending  AA/NA attended?: [place], Saturday and Sunday  Sponsor?: No   Brandon Melnick, LCAS 04/19/2017 5:43 PM

## 2017-04-19 NOTE — Therapy (Signed)
[facility] [address], [place], [place], [address] Phone: [phone]   Fax:  [phone]  Physical Therapy Treatment  Patient Details  Name: Mercedes Mitchell MRN: [id number] Date of Birth: 1961/03/22 Referring Provider: [place], Betty G, MD  Encounter Date: 04/19/2017      PT End of Session - 04/19/17 0921    Visit Number 6   Date for PT Re-Evaluation 05/20/17   PT Start Time 7322  pt late   PT Stop Time 0932   PT Time Calculation (min) 38 min   Activity Tolerance Patient tolerated treatment well      Past Medical History:  Diagnosis Date  . 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  paracentesis 2.6 liters 5/4: no SBP.    Marland Kitchen Alcoholism ([place]) 12/2016  . Candida esophagitis ([name])   . Cirrhosis ([place])   . DVT (deep venous thrombosis) ([place])   . Dysphagia 2011  . Esophagitis   . Hepatic steatosis   . Hiatal hernia   . Hiatal hernia   . Macrocytic anemia 12/2016  . Malnutrition ([place]) 12/2016  . Panic type anxiety neurosis 2011  . Portal hypertension ([place])   . Portal hypertensive gastropathy [facility])     Past Surgical History:  Procedure Laterality Date  . ESOPHAGOGASTRODUODENOSCOPY N/A 12/25/2016   Procedure: ESOPHAGOGASTRODUODENOSCOPY (EGD);  Surgeon: Jerene Bears, MD;  Location: [facility];  Service: Endoscopy;  Laterality: N/A;  . IR PARACENTESIS  02/02/2017  . IR TRANSCATHETER BX  02/02/2017  . IR US GUIDE VASC ACCESS RIGHT  02/02/2017  . IR VENOGRAM HEPATIC W HEMODYNAMIC EVALUATION  02/02/2017    There were no vitals filed for this visit.      Subjective Assessment - 04/19/17 0859    Subjective I was at an [place] and I was wearing shoes that were too big and I tripped and fell on my right hip.  It is swollen.  She also reports that her purse slipped off her shoulder onto her forearm shearing her skin.   She has a  3 inch wound on  her forearm and reports she has been using antibiotic ointment.     Currently in Pain? Yes   Pain Score 1    Pain Location Hip   Pain Orientation Right   Pain Type Acute pain                         [facility] Adult PT Treatment/Exercise - 04/19/17 0001      Neuro Re-ed    Neuro Re-ed Details  dynamic reaching, head turns, body turns on stable and unstable surface     Knee/Hip Exercises: Stretches   Active Hamstring Stretch Right;Left;5 reps   Active Hamstring Stretch Limitations on 2nd step   Hip Flexor Stretch Right;Left;5 reps   Hip Flexor Stretch Limitations on 2nd step   Other Knee/Hip Stretches calf stretch 2x 20 sec hold     Knee/Hip Exercises: Aerobic   Stationary Bike 8 min   Recumbent Bike L1 10 min     Knee/Hip Exercises: Standing   Heel Raises Both;15 reps     Knee/Hip Exercises: Seated   Long Arc Quad Strengthening;Right;Left;10 reps   Long Arc Quad Limitations red band   Knee/Hip Flexion red band hip/knee flexion 10x   Sit to [employer] 10 reps;without UE support  no compensatory strategies  PT Short Term Goals - 04/19/17 0931      PT SHORT TERM GOAL #1   Title independent with initial HEP   Status Achieved     PT SHORT TERM GOAL #2   Title pt reports feeling 25% more steady when walking in the community   Status Achieved           PT Long Term Goals - 04/19/17 0931      PT LONG TERM GOAL #1   Title able to get up and down from the floor easier   Time 8   Period Weeks   Status On-going     PT LONG TERM GOAL #2   Title independent with advanced HEP   Time 8   Period Weeks   Status On-going     PT LONG TERM GOAL #3   Title TUG < or = to 12 sec   Time 8   Period Weeks   Status On-going     PT LONG TERM GOAL #4   Title Berg > or = to 49/56   Time 8   Period Weeks   Status On-going     PT LONG TERM GOAL #5   Title able to demonstrate greater variation in gait speed based on DGI, due to improved  stability    Time 8   Period Weeks   Status On-going               Plan - 04/19/17 0938    Clinical Impression Statement Treatment modified secondary to skin tears.  She reports she is on a steroid which makes her skin very fragile.  One of her tears starts to bleed in the clinic and patient self applied a tissue to stop the bleeding.  Focused on LE strengthening in sitting to avoid accidentally bumping her arms while balancing in standing.  No complaints of hip pain from her fall on Friday night.     Rehab Potential Good   PT Frequency 2x / week   PT Duration 8 weeks   PT Treatment/Interventions ADLs/Self Care Home Management;Cryotherapy;Electrical Stimulation;Moist Heat;Therapeutic activities;Therapeutic exercise;Balance training;Neuromuscular re-education;Gait training;Stair training;Manual techniques;Patient/family education;Taping;Dry needling;Passive range of motion;Functional mobility training   PT Next Visit Plan overall strength UE and LE, gait, stairs, balance      Patient will benefit from skilled therapeutic intervention in order to improve the following deficits and impairments:  Abnormal gait, Difficulty walking, Decreased strength, Postural dysfunction, Decreased coordination, Decreased balance  Visit Diagnosis: Difficulty in walking, not elsewhere classified  Muscle weakness (generalized)  Abnormal posture     Problem List Patient Active Problem List   Diagnosis Date Noted  . Sepsis ([place]) 03/26/2017  . Community acquired pneumonia of right middle lobe of lung ([place])   . Sacral decubitus ulcer, stage III ([place]) 02/18/2017  . Abdominal pain 02/10/2017  . Acute deep vein thrombosis (DVT) of right femoral vein ([place]) 02/10/2017  . Hyponatremia 02/10/2017  . DVT (deep venous thrombosis) ([place]) 02/10/2017  . Alcoholic cirrhosis of liver with ascites ([place]) 01/12/2017  . GI bleeding 01/12/2017  . Current every day smoker 01/12/2017  . Ascites   . Coffee ground  emesis   . Acute esophagitis   . Protein-calorie malnutrition, severe 12/24/2016  . Acute alcoholic hepatitis 18/29/9371  . ETOH abuse 12/23/2016  . Macrocytic anemia 12/21/2016  . 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 DYSPHAGIA 03/25/2010   Ruben Im, PT 04/19/17 2:12 PM Phone: [phone] Fax: [phone]  Alvera Singh 04/19/2017, 2:10 PM  [facility] [address] [place], [place], [address] Phone: [phone]   Fax:  [phone]  Name: Emalene Welte MRN: [id number] Date of Birth: 07/04/1961

## 2017-04-20 ENCOUNTER — Telehealth: Payer: Self-pay | Admitting: Internal Medicine

## 2017-04-20 ENCOUNTER — Other Ambulatory Visit (HOSPITAL_COMMUNITY): Payer: 59 | Admitting: Psychology

## 2017-04-20 ENCOUNTER — Encounter (HOSPITAL_COMMUNITY): Payer: Self-pay | Admitting: Psychology

## 2017-04-20 DIAGNOSIS — F1020 Alcohol dependence, uncomplicated: Secondary | ICD-10-CM

## 2017-04-20 DIAGNOSIS — F1421 Cocaine dependence, in remission: Secondary | ICD-10-CM

## 2017-04-20 NOTE — Progress Notes (Signed)
    Daily Group Progress Note  Program: CD-IOP   04/20/2017 Mercedes Mitchell [id number]  Diagnosis:  Alcohol use disorder, severe, dependence ([name])  Cocaine use disorder, severe, in early remission, dependence ([name])   Sobriety Date: 5/4  Group Time: 1-2:30  Participation Level: Active  Behavioral Response: Appropriate and Sharing  Type of Therapy: Process Group  Interventions: CBT, Strength-based and Supportive  Topic: Patients were active and engaged in group today. Patients shared about their challenges and successes in early recovery. Some UDS results were collected from pts and some results were returned. Pts focused on sharing their interpersonal conflicts w/ family members. One member was not present after being challenged on Monday to consider a higher level of care. Group members process his absence and reflected on their here-and-now feelings. Two new employees of [employer] as "peer support specialists" were present and listened actively in group to learn more about the service of [name].     Group Time: 2:30-4  Participation Level: Active  Behavioral Response: Appropriate and Sharing  Type of Therapy: Psycho-education Group  Interventions: CBT and Family Systems  Topic: Patients were active and engaged in group psychoeducation today. Counselors guided a discussion on family roles and took a deeper dive into the way family dynamics show up in the group therapy room. Pts were encouraged to share their thoughts and reflections on family roles as they appear in group, including "Hero, enabler, scapegoat, mascot, and lost child".    Summary: Patient was active and engaged in session. She reported she attended 2 AA meetings since last group. She was happy to share that she shared openly in the large women's only meeting this morning for the first time. She admitted her and her husband got into an argument about finances since pt looked into their online bank account  and accused her husband of spending money behind her back. Pt admitted this was hasty and she could assertively confront him next time instead of using passive aggression. Pt shared helpful and supportive feedback to a young female group member who was struggling w/ how to handle her current relationship to her mother.   UDS collected: No Results: negative  AA/NA attended?: YesTuesday and Wednesday  Sponsor?: No   Youlanda Roys, LPCA LCASA 04/20/2017 4:51 PM

## 2017-04-20 NOTE — Telephone Encounter (Signed)
Pt will come for labs tomorrow, Dr. Hilarie Fredrickson wanted her to have LFT's checked every 2 weeks. Pt aware.

## 2017-04-21 ENCOUNTER — Other Ambulatory Visit (HOSPITAL_COMMUNITY): Payer: 59 | Admitting: Psychology

## 2017-04-21 ENCOUNTER — Ambulatory Visit: Payer: Managed Care, Other (non HMO) | Admitting: Physical Therapy

## 2017-04-21 ENCOUNTER — Other Ambulatory Visit: Payer: Self-pay | Admitting: Internal Medicine

## 2017-04-21 ENCOUNTER — Other Ambulatory Visit (INDEPENDENT_AMBULATORY_CARE_PROVIDER_SITE_OTHER): Payer: Managed Care, Other (non HMO)

## 2017-04-21 ENCOUNTER — Telehealth: Payer: Self-pay | Admitting: Family Medicine

## 2017-04-21 ENCOUNTER — Encounter (HOSPITAL_COMMUNITY): Payer: Self-pay | Admitting: Psychology

## 2017-04-21 DIAGNOSIS — K7030 Alcoholic cirrhosis of liver without ascites: Secondary | ICD-10-CM | POA: Diagnosis not present

## 2017-04-21 DIAGNOSIS — R293 Abnormal posture: Secondary | ICD-10-CM

## 2017-04-21 DIAGNOSIS — F1421 Cocaine dependence, in remission: Secondary | ICD-10-CM

## 2017-04-21 DIAGNOSIS — M6281 Muscle weakness (generalized): Secondary | ICD-10-CM

## 2017-04-21 DIAGNOSIS — F1020 Alcohol dependence, uncomplicated: Secondary | ICD-10-CM | POA: Diagnosis not present

## 2017-04-21 DIAGNOSIS — R262 Difficulty in walking, not elsewhere classified: Secondary | ICD-10-CM

## 2017-04-21 LAB — HEPATIC FUNCTION PANEL
ALT: 30 U/L (ref 0–35)
AST: 39 U/L — ABNORMAL HIGH (ref 0–37)
Albumin: 3.7 g/dL (ref 3.5–5.2)
Alkaline Phosphatase: 155 U/L — ABNORMAL HIGH (ref 39–117)
BILIRUBIN TOTAL: 1.5 mg/dL — AB (ref 0.2–1.2)
Bilirubin, Direct: 0.6 mg/dL — ABNORMAL HIGH (ref 0.0–0.3)
Total Protein: 7 g/dL (ref 6.0–8.3)

## 2017-04-21 NOTE — Therapy (Signed)
[facility] [address] [place], [place], [address] Phone: [phone]   Fax:  [phone]  Physical Therapy Treatment  Patient Details  Name: Mercedes Mitchell MRN: [id number] Date of Birth: 12-22-1960 Referring Provider: [place], Betty G, MD  Encounter Date: 04/21/2017      PT End of Session - 04/21/17 0902    Visit Number 7   Date for PT Re-Evaluation 05/20/17   PT Start Time 0846   PT Stop Time 0930   PT Time Calculation (min) 44 min   Activity Tolerance Patient tolerated treatment well      Past Medical History:  Diagnosis Date  . 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  paracentesis 2.6 liters 5/4: no SBP.    Marland Kitchen Alcoholism ([place]) 12/2016  . Candida esophagitis ([place])   . Cirrhosis ([place])   . DVT (deep venous thrombosis) ([place])   . Dysphagia 2011  . Esophagitis   . Hepatic steatosis   . Hiatal hernia   . Hiatal hernia   . Macrocytic anemia 12/2016  . Malnutrition ([place]) 12/2016  . Panic type anxiety neurosis 2011  . Portal hypertension ([place])   . Portal hypertensive gastropathy [facility])     Past Surgical History:  Procedure Laterality Date  . ESOPHAGOGASTRODUODENOSCOPY N/A 12/25/2016   Procedure: ESOPHAGOGASTRODUODENOSCOPY (EGD);  Surgeon: Jerene Bears, MD;  Location: [facility];  Service: Endoscopy;  Laterality: N/A;  . IR PARACENTESIS  02/02/2017  . IR TRANSCATHETER BX  02/02/2017  . IR US GUIDE VASC ACCESS RIGHT  02/02/2017  . IR VENOGRAM HEPATIC W HEMODYNAMIC EVALUATION  02/02/2017    There were no vitals filed for this visit.      Subjective Assessment - 04/21/17 0851    Subjective The patient continues to have a large wound on her left forearm.  She plans on seeing her doctor regarding the wound.   States her hip is fine.     Currently in Pain? No/denies   Pain Score 0-No pain                         [facility]  Adult PT Treatment/Exercise - 04/21/17 0001      Therapeutic Activites    Therapeutic Activities ADL's;Other Therapeutic Activities     Neuro Re-ed    Neuro Re-ed Details  dynamic reaching, head turns, body turns on stable and unstable surface     Knee/Hip Exercises: Stretches   Active Hamstring Stretch Right;Left;5 reps   Active Hamstring Stretch Limitations on 2nd step   Hip Flexor Stretch Right;Left;5 reps   Hip Flexor Stretch Limitations on 2nd step     Knee/Hip Exercises: Aerobic   Stationary Bike 8 min   Recumbent Bike L1 10 min     Knee/Hip Exercises: Standing   Heel Raises Both;15 reps   Forward Step Up Right;Left;10 reps;Step Height: 6";Hand Hold: 0   SLS Wall climbs UE/LE 10x   Other Standing Knee Exercises floor slider hip  extension and abduction 10x bil   Other Standing Knee Exercises wall pull aways 15x     Knee/Hip Exercises: Seated   Sit to [place] 10 reps;without UE support  no compensatory strategies                  PT Short Term Goals - 04/21/17 0920      PT SHORT TERM GOAL #1   Title  independent with initial HEP   Status Achieved     PT SHORT TERM GOAL #2   Title pt reports feeling 25% more steady when walking in the community   Status Achieved           PT Long Term Goals - 04/21/17 0920      PT LONG TERM GOAL #1   Title able to get up and down from the floor easier   Time 8   Period Weeks   Status On-going     PT LONG TERM GOAL #2   Title independent with advanced HEP   Time 8   Period Weeks   Status On-going     PT LONG TERM GOAL #3   Title TUG < or = to 12 sec   Time 8   Period Weeks   Status On-going     PT LONG TERM GOAL #4   Title Berg > or = to 49/56   Time 8   Period Weeks   Status On-going     PT LONG TERM GOAL #5   Title able to demonstrate greater variation in gait speed based on DGI, due to improved stability    Time 8   Period Weeks   Status On-going               Plan - 04/21/17 0914     Clinical Impression Statement Patient able to participate in balance and strengthening with close attention to avoid bumping her UEs secondary to skin tears.  Difficulty with sit to stand today secondary to soreness in groin region from recent fall.  Pins/needles in feet affecting her balance/proprioception.  Patient continues to be highly motivated with progressing exercise.    Rehab Potential Good   PT Frequency 2x / week   PT Duration 8 weeks   PT Treatment/Interventions ADLs/Self Care Home Management;Cryotherapy;Electrical Stimulation;Moist Heat;Therapeutic activities;Therapeutic exercise;Balance training;Neuromuscular re-education;Gait training;Stair training;Manual techniques;Patient/family education;Taping;Dry needling;Passive range of motion;Functional mobility training   PT Next Visit Plan overall strength UE and LE, gait, stairs, balance      Patient will benefit from skilled therapeutic intervention in order to improve the following deficits and impairments:  Abnormal gait, Difficulty walking, Decreased strength, Postural dysfunction, Decreased coordination, Decreased balance  Visit Diagnosis: Difficulty in walking, not elsewhere classified  Muscle weakness (generalized)  Abnormal posture     Problem List Patient Active Problem List   Diagnosis Date Noted  . Sepsis ([place]) 03/26/2017  . Community acquired pneumonia of right middle lobe of lung ([place])   . Sacral decubitus ulcer, stage III ([place]) 02/18/2017  . Abdominal pain 02/10/2017  . Acute deep vein thrombosis (DVT) of right femoral vein ([place]) 02/10/2017  . Hyponatremia 02/10/2017  . DVT (deep venous thrombosis) ([place]) 02/10/2017  . Alcoholic cirrhosis of liver with ascites ([place]) 01/12/2017  . GI bleeding 01/12/2017  . Current every day smoker 01/12/2017  . Ascites   . Coffee ground emesis   . Acute esophagitis   . Protein-calorie malnutrition, severe 12/24/2016  . Acute alcoholic hepatitis 93/23/5573  . ETOH abuse  12/23/2016  . Macrocytic anemia 12/21/2016  . 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 DYSPHAGIA 03/25/2010   Ruben Im, PT 04/21/17 9:22 AM Phone: [phone] Fax: [phone]  Alvera Singh 04/21/2017, Sandi Mariscal AM  [facility] [address], [place], [place], [address] Phone: [phone]   Fax:  [phone]  Name: Mercedes Mitchell MRN: [id number] Date of Birth: 09/12/1960

## 2017-04-21 NOTE — Telephone Encounter (Signed)
The patient came in wanting to see her provider. The provider is out of the office and she only wanted to see her. She has a wound on her right arm that she is needing an antibodic refilled or changed to a different Rx. Then at the end of the conversation she said that there are also some other things that she would like to discuss with her provider. She also said that if it is still festered up in the morning then she might cal and make an appointment with another provider. Please advise patient

## 2017-04-22 NOTE — Telephone Encounter (Signed)
Pt states the wound on her arm is better. Pt will call back if anything changes.  Nothing further needed.

## 2017-04-22 NOTE — Telephone Encounter (Signed)
Noted, thank you

## 2017-04-22 NOTE — Telephone Encounter (Signed)
Left voicemail for patient to call the office back.   

## 2017-04-23 HISTORY — PX: BREAST BIOPSY: SHX20

## 2017-04-26 ENCOUNTER — Encounter (HOSPITAL_COMMUNITY): Payer: Self-pay | Admitting: Psychology

## 2017-04-26 ENCOUNTER — Ambulatory Visit: Payer: Managed Care, Other (non HMO) | Attending: Family Medicine | Admitting: Physical Therapy

## 2017-04-26 DIAGNOSIS — R293 Abnormal posture: Secondary | ICD-10-CM | POA: Diagnosis present

## 2017-04-26 DIAGNOSIS — R262 Difficulty in walking, not elsewhere classified: Secondary | ICD-10-CM | POA: Insufficient documentation

## 2017-04-26 DIAGNOSIS — M6281 Muscle weakness (generalized): Secondary | ICD-10-CM | POA: Diagnosis present

## 2017-04-26 NOTE — Progress Notes (Deleted)
    Daily Group Progress Note  Program: CD-IOP   04/26/2017 [name] [id number]  Diagnosis:  Alcohol use disorder, severe, dependence ([place])  Cocaine use disorder, severe, in early remission, dependence ([place])   Sobriety Date: ***  Group Time: 1-2:30  Participation Level: {CHL AMB BH Group Participation:21022742}  Behavioral Response: {CHL AMB BH Group Behavior:21022743}  Type of Therapy: {CHL AMB BH Type of Therapy:21022741}  Interventions: {CHL AMB BH Type of Intervention:21022753}  Topic: ***     Group Time: 2:30-4  Participation Level: {CHL AMB BH Group Participation:21022742}  Behavioral Response: {CHL AMB BH Group Behavior:21022743}  Type of Therapy: {CHL AMB BH Type of Therapy:21022741}  Interventions: {CHL AMB BH Type of Intervention:21022753}  Topic: ***   Summary: ***   UDS collected: {BHH YES OR NO:22294} Results: {Findings; urine drug screen:60936}  AA/NA attended?: {BHH YES OR NO:22294}{DAYS OF WPVX:48016}  Sponsor?: {BHH YES OR PV:37482}   Brandon Melnick, LCAS 04/26/2017 2:38 PM

## 2017-04-26 NOTE — Therapy (Signed)
[facility] [address], [place], [place], [address] Phone: [phone]   Fax:  [phone]  Physical Therapy Treatment  Patient Details  Name: Mercedes Mitchell MRN: [id number] Date of Birth: 05-09-1961 Referring Provider: Martinique, Betty G, MD  Encounter Date: 04/26/2017      PT End of Session - 04/26/17 0911    Visit Number 8   Date for PT Re-Evaluation 05/20/17   PT Start Time 6948  pt late   PT Stop Time 0931   PT Time Calculation (min) 39 min   Activity Tolerance Patient tolerated treatment well      Past Medical History:  Diagnosis Date  . 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  paracentesis 2.6 liters 5/4: no SBP.    Marland Kitchen Alcoholism ([place]) 12/2016  . Candida esophagitis ([place])   . Cirrhosis ([place])   . DVT (deep venous thrombosis) ([place])   . Dysphagia 2011  . Esophagitis   . Hepatic steatosis   . Hiatal hernia   . Hiatal hernia   . Macrocytic anemia 12/2016  . Malnutrition ([place]) 12/2016  . Panic type anxiety neurosis 2011  . Portal hypertension ([place])   . Portal hypertensive gastropathy [facility])     Past Surgical History:  Procedure Laterality Date  . ESOPHAGOGASTRODUODENOSCOPY N/A 12/25/2016   Procedure: ESOPHAGOGASTRODUODENOSCOPY (EGD);  Surgeon: Jerene Bears, MD;  Location: [facility];  Service: Endoscopy;  Laterality: N/A;  . IR PARACENTESIS  02/02/2017  . IR TRANSCATHETER BX  02/02/2017  . IR US GUIDE VASC ACCESS RIGHT  02/02/2017  . IR VENOGRAM HEPATIC W HEMODYNAMIC EVALUATION  02/02/2017    There were no vitals filed for this visit.      Subjective Assessment - 04/26/17 0854    Subjective Still with some soreness in groin region.  Forearm wound is healing per patient report.  States her balance has been OK but went somewhere that did not have a railing on the steps which she was uneasy about.     Currently in Pain? No/denies   Pain Score 0-No pain                         [facility] Adult PT Treatment/Exercise - 04/26/17 0001      Therapeutic Activites    Therapeutic Activities ADL's;Other Therapeutic Activities     Neuro Re-ed    Neuro Re-ed Details  dynamic reaching, head turns, body turns on stable and unstable surface     Knee/Hip Exercises: Stretches   Active Hamstring Stretch Right;Left;5 reps   Active Hamstring Stretch Limitations on 2nd step   Hip Flexor Stretch Right;Left;5 reps   Hip Flexor Stretch Limitations on 2nd step   Other Knee/Hip Stretches calf stretch 2x 20 sec hold     Knee/Hip Exercises: Aerobic   Recumbent Bike L2 x 10 min     Knee/Hip Exercises: Standing   Heel Raises Both;15 reps   Lateral Step Up Right;Left;10 reps;Hand Hold: 1;Step Height: 6"   Forward Step Up Right;Left;10 reps;Step Height: 6";Hand Hold: 0   Other Standing Knee Exercises reaching and stepping 5x each side   Other Standing Knee Exercises high step, high stepping 2 laps each     Knee/Hip Exercises: Seated   Sit to Sand 10 reps;without UE support  no compensatory strategies  PT Short Term Goals - 04/26/17 4782      PT SHORT TERM GOAL #1   Title independent with initial HEP   Status Achieved     PT SHORT TERM GOAL #2   Title pt reports feeling 25% more steady when walking in the community   Status Achieved           PT Long Term Goals - 04/26/17 9562      PT LONG TERM GOAL #1   Title able to get up and down from the floor easier   Time 8   Period Weeks   Status On-going     PT LONG TERM GOAL #2   Title independent with advanced HEP   Time 8   Period Weeks   Status On-going     PT LONG TERM GOAL #3   Title TUG < or = to 12 sec   Time 8   Period Weeks   Status On-going     PT LONG TERM GOAL #4   Title Berg > or = to 49/56   Time 8   Period Weeks   Status On-going     PT LONG TERM GOAL #5   Title able to demonstrate greater variation in  gait speed based on DGI, due to improved stability    Time 8   Period Weeks   Status On-going               Plan - 04/26/17 0912    Clinical Impression Statement The patient has difficulty maintaining balance with narrow base of support especially with head turns and UE reaches as well as single limb standing.  Improving ankle strategy vs. hip strategy for balance.  Close supervision for safety and attention to avoid further skin tears.  Unable to do quadruped secondary to healing knee abrasions.     PT Frequency 2x / week   PT Duration 8 weeks   PT Treatment/Interventions ADLs/Self Care Home Management;Cryotherapy;Electrical Stimulation;Moist Heat;Therapeutic activities;Therapeutic exercise;Balance training;Neuromuscular re-education;Gait training;Stair training;Manual techniques;Patient/family education;Taping;Dry needling;Passive range of motion;Functional mobility training   PT Next Visit Plan overall strength UE and LE, gait, stairs, balance;  recheck TUG and BERG      Patient will benefit from skilled therapeutic intervention in order to improve the following deficits and impairments:  Abnormal gait, Difficulty walking, Decreased strength, Postural dysfunction, Decreased coordination, Decreased balance  Visit Diagnosis: Difficulty in walking, not elsewhere classified  Muscle weakness (generalized)  Abnormal posture     Problem List Patient Active Problem List   Diagnosis Date Noted  . Sepsis ([place]) 03/26/2017  . Community acquired pneumonia of right middle lobe of lung ([place])   . Sacral decubitus ulcer, stage III ([place]) 02/18/2017  . Abdominal pain 02/10/2017  . Acute deep vein thrombosis (DVT) of right femoral vein ([place]) 02/10/2017  . Hyponatremia 02/10/2017  . DVT (deep venous thrombosis) ([place]) 02/10/2017  . Alcoholic cirrhosis of liver with ascites ([place]) 01/12/2017  . GI bleeding 01/12/2017  . Current every day smoker 01/12/2017  . Ascites   . Coffee ground  emesis   . Acute esophagitis   . Protein-calorie malnutrition, severe 12/24/2016  . Acute alcoholic hepatitis 13/03/6577  . ETOH abuse 12/23/2016  . Macrocytic anemia 12/21/2016  . 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 DYSPHAGIA 03/25/2010   Ruben Im, PT 04/26/17 9:28 AM Phone: [phone] Fax: [phone]  Alvera Singh 04/26/2017, 9:26 AM  [facility] [address], [place], [place],  [address] Phone: [phone]   Fax:  [phone]  Name: Mercedes Mitchell MRN: [id number] Date of Birth: May 13, 1961

## 2017-04-26 NOTE — Progress Notes (Signed)
HPI:   [name] is a 56 y.o. female, who is here today with some concerns.  She was seen on 04/04/17 for hospitalization follow-up. Since her last OV she has followed with GI.  C/O bilateral plantar numbness and tingling for a while but seems worse for the past 2 weeks. She has not noted edema,erythema,or foal weakness. She denies fever,chills, rash,or lower back pain. Problem is intermittent, worse during the day when she is standing and better with rest.  + Coloration changes also noted around ankles, "purplish." She denies cold extremities or pain. Also c/o peri ankle pruritus and on distal LE's.   Hx of alcoholism,cirrhosis with ascitics. She has been sobered since 12/23/2016.  She is following with liver transplant team. LFT's greatly improved. She is eating better, gaining wt.  Attending behavioral therapy, intensive alcohol dependency treatment.  She also attending [facility] 3 times per week.  She denies abdominal distention,nausea,or vomiting. She is having about 3 soft stools daily, taking Lactulose.  Prednisolone recently decreased.   Lab Results  Component Value Date   ALT 30 04/21/2017   AST 39 (H) 04/21/2017   ALKPHOS 155 (H) 04/21/2017   BILITOT 1.5 (H) 04/21/2017     Lab Results  Component Value Date   [id number] 854 12/25/2016   Lab Results  Component Value Date   TSH 1.446 02/19/2017     Lab Results  Component Value Date   CREATININE 0.47 03/28/2017   BUN 16 03/28/2017   NA 134 (L) 03/28/2017   K 3.9 03/28/2017   CL 103 03/28/2017   CO2 23 03/28/2017   She is also concerned about left breast surgery, which according to pt, was recommended to removed breast mass. Underwent left breast OE:VOJJKKX SCLEROSING LESION. She has an appt with surgeon on 04/29/17.  RLE DVT: She is on Lovenox 1.5 mg /Kg/day. She is c/o easy bleeding with minimal trauma. She has had some trauma on right upper and LE ,bumping on furniture or  steps. She denies gum or nose bleed, gross hematuria, or blood in stool.  Lab Results  Component Value Date   WBC 11.0 (H) 03/28/2017   HGB 10.6 (L) 03/28/2017   HCT 31.7 (L) 03/28/2017   MCV 103.6 (H) 03/28/2017   PLT 192 03/28/2017    Noted tachycardia. She has not noted palpitations and lightheadedness has improved, as well as balance. Denies severe/frequent headache, visual changes, chest pain, dyspnea,or claudication.    Review of Systems  Constitutional: Positive for fatigue. Negative for chills and fever.  HENT: Negative for mouth sores, sore throat and trouble swallowing.   Eyes: Negative for redness and visual disturbance.  Respiratory: Negative for cough, shortness of breath and wheezing.   Cardiovascular: Negative for chest pain, palpitations and leg swelling.  Gastrointestinal: Negative for abdominal pain, blood in stool, nausea and vomiting.       No changes in bowel habits.  Endocrine: Negative for cold intolerance, heat intolerance, polydipsia, polyphagia and polyuria.  Genitourinary: Negative for dysuria, hematuria and urgency.  Musculoskeletal: Positive for gait problem. Negative for back pain and myalgias.  Skin: Positive for wound. Negative for rash.  Neurological: Positive for tremors (stable.) and numbness. Negative for syncope, weakness and headaches.  Hematological: Bruises/bleeds easily.  Psychiatric/Behavioral: Negative for confusion and hallucinations. The patient is nervous/anxious.      Current Outpatient Prescriptions on File Prior to Visit  Medication Sig Dispense Refill  . camphor-menthol (SARNA) lotion Apply topically as needed for itching. [place]  mL 0  . feeding supplement, ENSURE ENLIVE, (ENSURE ENLIVE) LIQD Take 237 mLs by mouth 3 (three) times daily between meals. 90 Bottle 0  . folic acid (FOLVITE) 1 MG tablet Take 1 tablet (1 mg total) by mouth daily. 100 tablet 5  . furosemide (LASIX) 20 MG tablet Take 4 tablets (80 mg total) by mouth  daily.    . hydrocerin (EUCERIN) CREA Apply 1 application topically daily. Apply to bilateral legs once daily 500 g 1  . lactulose (CHRONULAC) 10 GM/15ML solution Take 30 mLs (20 g total) by mouth 2 (two) times daily. 1800 mL 1  . Multiple Vitamin (MULTIVITAMIN WITH MINERALS) TABS tablet Take 1 tablet by mouth daily. 30 tablet 0  . mupirocin cream (BACTROBAN) 2 % Apply 1 application topically 2 (two) times daily. 30 g 0  . nystatin (MYCOSTATIN) 100000 UNIT/ML suspension TAKE 5ML BY MOUTH FOUR TIMES DAILY   *SHAKE WELL BEFORE EACH USE* 60 mL 3  . ondansetron (ZOFRAN) 4 MG tablet Take 1 tablet (4 mg total) by mouth every 8 (eight) hours as needed for nausea or vomiting. 20 tablet 0  . pantoprazole (PROTONIX) 40 MG tablet TAKE ONE TABLET BY MOUTH TWICE A DAY 60 tablet 2  . prednisoLONE (PRELONE) 15 MG/5ML SOLN Take 10 mg(3.3 ml) once daily x 2 weeks, then decrease to 5 mg (1.7 ml) once daily thereafter until seen in office 100 mL 0  . spironolactone (ALDACTONE) 100 MG tablet     . thiamine 100 MG tablet Take 1 tablet (100 mg total) by mouth daily. 30 tablet 0  . B Complex-C-Folic Acid (SM B SUPER VITAMIN COMPLEX) TABS Take 1 tablet by mouth daily. 90 tablet 3  . enoxaparin (LOVENOX) 60 MG/0.6ML injection     . QUEtiapine (SEROQUEL) 25 MG tablet Take 1 tablet (25 mg total) by mouth at bedtime as needed (sleep). May take 2 tablets if needed 180 tablet 1   No current facility-administered medications on file prior to visit.      Past Medical History:  Diagnosis Date  . 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  paracentesis 2.6 liters 5/4: no SBP.    Marland Kitchen Alcoholism ([place]) 12/2016  . Candida esophagitis ([place])   . Cirrhosis ([place])   . DVT (deep venous thrombosis) ([place])   . Dysphagia 2011  . Esophagitis   . Hepatic steatosis   . Hiatal hernia   . Hiatal hernia   . Macrocytic anemia 12/2016  . Malnutrition ([place]) 12/2016  . Panic  type anxiety neurosis 2011  . Portal hypertension ([place])   . Portal hypertensive gastropathy ([facility])    Allergies  Allergen Reactions  . Adhesive [Tape]     Social History   Social History  . Marital status: Married    Spouse name: N/A  . Number of children: N/A  . Years of education: N/A   Occupational History  . Unemployed    Social History Main Topics  . Smoking status: Former Smoker    Types: Cigarettes  . Smokeless tobacco: Never Used     Comment: Reports she quit "about 6 months ago"  . Alcohol use Yes     Comment: Last drink: 36 days ago   . Drug use: No     Comment: Last use Cocaine 11/17, reports last use "over a year", [facility] 2/18, reports "a year" ago  . Sexual activity: Not Asked   Other Topics Concern  .  None   Social History Narrative   Married, children, not working    Vitals:   04/27/17 0908 04/27/17 0951  BP: 118/70   Pulse: (!) 120 (!) 116  Resp: 12   SpO2: 98%    Body mass index is 18.44 kg/m.  Wt Readings from Last 3 Encounters:  04/27/17 114 lb 4 oz (51.8 kg)  04/04/17 108 lb (49 kg)  03/26/17 102 lb 15.3 oz (46.7 kg)    Physical Exam  Nursing note and vitals reviewed. Constitutional: She is oriented to person, place, and time. She appears well-developed. She appears cachectic. No distress.  HENT:  Head: Normocephalic and atraumatic.  Mouth/Throat: Oropharynx is clear and moist and mucous membranes are normal.  Eyes: Pupils are equal, round, and reactive to light. Conjunctivae are normal.  Cardiovascular: Regular rhythm.  Tachycardia present.   No murmur heard. Pulses:      Dorsalis pedis pulses are 2+ on the right side, and 2+ on the left side.       Posterior tibial pulses are 2+ on the right side, and 2+ on the left side.  Bilateral LE varicose veins, small telangiectasias around ankles mainly.  Respiratory: Effort normal and breath sounds normal. No respiratory distress.  GI: Soft. She exhibits no mass. There is no tenderness.   Musculoskeletal: She exhibits no edema or tenderness.  Lymphadenopathy:    She has no cervical adenopathy.  Neurological: She is alert and oriented to person, place, and time. She displays tremor.  Monofilament feet normal. Stable gait with no assistance.   Skin: Skin is warm. Abrasion and ecchymosis noted. No petechiae and no rash noted. No erythema.  She has scattered on LE's, R>L and upper extremities. Ecchymosis on forearms, bilateral. Brown crust on right knee and upper pretibial area. Distal pretibial area superficial excoriation with dry blood.  Psychiatric: Her mood appears anxious.  Well groomed, good eye contact.    ASSESSMENT AND PLAN:   Ms. Saniyah was seen today for numbness/tingling feet.  Diagnoses and all orders for this visit:  Numbness and tingling of both feet  We discussed possible etiologies. ? Neuropathic related to alcohol. Foot care discussed. She has had extensive work-up done, I do not think further studies are needed at this time. Instructed about warning signs.  Deep vein thrombosis (DVT) of femoral vein of right lower extremity, unspecified chronicity ([facility])  First event. She will complete 3 months of anticoagulation, around 05/12/17. Side effects of Lovenox and coagulation abnormalities associated with liver disease discussed. Lovenox dose was adjust to her current wt.          -    enoxaparin (LOVENOX) 60 MG/0.6ML injection; Inject 0.8 mLs (80 mg total) into the skin daily.  Varicose veins of both lower extremities  Educated about Dx. Vein disease can also cause skin discomfort and pigmentation changes. I do not think it is related to plantar numbness but could cause dorsal feet and distal LE tingling.  -     triamcinolone (KENALOG) 0.025 % ointment; Apply 1 application topically 2 (two) times daily.  Pruritus of skin  Around ankle could be related to vein disease. Topical steroid,small amount at the time, may help. Explained that  pruritus can also be related to liver disease. Skin hydration. Try to avoid trauma.  -     triamcinolone (KENALOG) 0.025 % ointment; Apply 1 application topically 2 (two) times daily.  Sinus tachycardia  Asymptomatic. ? Dehydration. Instructed to check HR periodically. She may need low dose  BB.  Instructed about warning signs.  Alcoholic cirrhosis of liver with ascites ([place])  Liver numbers are improving, encouraged to continue compliance with treatment. She has not needed paracentesis for the past 4 weeks. Jaundice ,balance,and tremor have improved.  She has appt with GI on 05/02/17.    She will keep appointment with surgeon. If possible , breast surgery to be performed after she discontinue Lovenox.     Hearl Heikes G. [place], MD  [facility]. [place] office.
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
## 2017-04-27 ENCOUNTER — Other Ambulatory Visit (HOSPITAL_COMMUNITY): Payer: Self-pay

## 2017-04-27 ENCOUNTER — Encounter: Payer: Self-pay | Admitting: Family Medicine

## 2017-04-27 ENCOUNTER — Other Ambulatory Visit (HOSPITAL_COMMUNITY): Payer: 59 | Attending: Psychiatry | Admitting: Psychology

## 2017-04-27 ENCOUNTER — Ambulatory Visit (INDEPENDENT_AMBULATORY_CARE_PROVIDER_SITE_OTHER): Payer: Managed Care, Other (non HMO) | Admitting: Family Medicine

## 2017-04-27 ENCOUNTER — Other Ambulatory Visit (INDEPENDENT_AMBULATORY_CARE_PROVIDER_SITE_OTHER): Payer: 59 | Admitting: Medical

## 2017-04-27 ENCOUNTER — Telehealth: Payer: Self-pay | Admitting: Family Medicine

## 2017-04-27 ENCOUNTER — Encounter (HOSPITAL_COMMUNITY): Payer: Self-pay | Admitting: Medical

## 2017-04-27 VITALS — BP 118/70 | HR 116 | Resp 12 | Ht 66.0 in | Wt 114.2 lb

## 2017-04-27 DIAGNOSIS — K7030 Alcoholic cirrhosis of liver without ascites: Secondary | ICD-10-CM | POA: Diagnosis not present

## 2017-04-27 DIAGNOSIS — Z6372 Alcoholism and drug addiction in family: Secondary | ICD-10-CM

## 2017-04-27 DIAGNOSIS — I8393 Asymptomatic varicose veins of bilateral lower extremities: Secondary | ICD-10-CM

## 2017-04-27 DIAGNOSIS — R188 Other ascites: Secondary | ICD-10-CM | POA: Diagnosis not present

## 2017-04-27 DIAGNOSIS — Z87828 Personal history of other (healed) physical injury and trauma: Secondary | ICD-10-CM

## 2017-04-27 DIAGNOSIS — E440 Moderate protein-calorie malnutrition: Secondary | ICD-10-CM | POA: Diagnosis not present

## 2017-04-27 DIAGNOSIS — F17200 Nicotine dependence, unspecified, uncomplicated: Secondary | ICD-10-CM | POA: Diagnosis not present

## 2017-04-27 DIAGNOSIS — F1020 Alcohol dependence, uncomplicated: Secondary | ICD-10-CM

## 2017-04-27 DIAGNOSIS — D539 Nutritional anemia, unspecified: Secondary | ICD-10-CM

## 2017-04-27 DIAGNOSIS — I82411 Acute embolism and thrombosis of right femoral vein: Secondary | ICD-10-CM

## 2017-04-27 DIAGNOSIS — Z79899 Other long term (current) drug therapy: Secondary | ICD-10-CM | POA: Insufficient documentation

## 2017-04-27 DIAGNOSIS — R000 Tachycardia, unspecified: Secondary | ICD-10-CM | POA: Diagnosis not present

## 2017-04-27 DIAGNOSIS — F10982 Alcohol use, unspecified with alcohol-induced sleep disorder: Secondary | ICD-10-CM | POA: Diagnosis not present

## 2017-04-27 DIAGNOSIS — F1421 Cocaine dependence, in remission: Secondary | ICD-10-CM | POA: Diagnosis not present

## 2017-04-27 DIAGNOSIS — K7031 Alcoholic cirrhosis of liver with ascites: Secondary | ICD-10-CM

## 2017-04-27 DIAGNOSIS — L299 Pruritus, unspecified: Secondary | ICD-10-CM | POA: Diagnosis not present

## 2017-04-27 DIAGNOSIS — R202 Paresthesia of skin: Secondary | ICD-10-CM | POA: Diagnosis not present

## 2017-04-27 DIAGNOSIS — R64 Cachexia: Secondary | ICD-10-CM | POA: Diagnosis not present

## 2017-04-27 DIAGNOSIS — R200 Anesthesia of skin: Secondary | ICD-10-CM | POA: Diagnosis not present

## 2017-04-27 DIAGNOSIS — F1210 Cannabis abuse, uncomplicated: Secondary | ICD-10-CM

## 2017-04-27 DIAGNOSIS — K2921 Alcoholic gastritis with bleeding: Secondary | ICD-10-CM | POA: Diagnosis not present

## 2017-04-27 MED ORDER — SM B SUPER VITAMIN COMPLEX PO TABS: 1.0000 | ORAL_TABLET | Freq: Every day | ORAL | 3 refills | Status: DC

## 2017-04-27 MED ORDER — QUETIAPINE FUMARATE 25 MG PO TABS: 25.0000 mg | ORAL_TABLET | Freq: Every evening | ORAL | 1 refills | Status: DC | PRN

## 2017-04-27 MED ORDER — TRIAMCINOLONE ACETONIDE 0.025 % EX OINT: 1.0000 "application " | TOPICAL_OINTMENT | Freq: Two times a day (BID) | CUTANEOUS | 1 refills | Status: DC

## 2017-04-27 MED ORDER — ENOXAPARIN SODIUM 60 MG/0.6ML ~~LOC~~ SOLN: 1.5000 mg/kg | SUBCUTANEOUS | 0 refills | Status: DC

## 2017-04-27 MED ORDER — ENOXAPARIN SODIUM 80 MG/0.8ML ~~LOC~~ SOLN: SUBCUTANEOUS | 0 refills | Status: DC

## 2017-04-27 NOTE — Telephone Encounter (Signed)
Please advise. Thanks.  

## 2017-04-27 NOTE — Progress Notes (Signed)
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 She continues to follow with the liver transplant team. Her lovenox treatments end 9/20 for which she is grateful.Her only episode of temptation to drink was when she was facing possible breast cancer but she did not and her biopsy was negative. A remarkable story of recovery so far. She did not require MAT for cravings. She did require Seroquel for sleep disturbance and will continue this until no longer needed.  Goals and Activities to Help Maintain Sobriety: 1. Stay away from old friends who continue to drink and use mind-altering chemicals. 2. Continue practicing Fair Fighting rules in interpersonal conflicts. 3. Continue alcohol and drug refusal skills and call on support systems. 4. Continue FU with Medical providers as scheduled  Medications: camphor-menthol lotion  Commonly known as: SARNA  Apply topically as needed for itching.   enoxaparin 60 MG/0.6ML injection  Commonly known as: LOVENOX    feeding supplement (ENSURE ENLIVE) Liqd  Take 237 mLs by mouth 3 (three) times daily between meals.   folic acid 1 MG tablet  Commonly known as: FOLVITE  Take 1 tablet (1 mg total) by mouth daily.   furosemide 20 MG tablet  Commonly known as: LASIX  Take 4 tablets (80 mg total) by mouth daily.   hydrocerin Crea  Apply 1 application topically daily. Apply to  bilateral legs once daily   lactulose 10 GM/15ML solution  Commonly known as: CHRONULAC  Take 30 mLs (20 g total) by mouth 2 (two) times daily.   multivitamin with minerals Tabs tablet  Take 1 tablet by mouth daily.   mupirocin cream 2 %  Commonly known as: BACTROBAN  Apply 1 application topically 2 (two) times daily.   nystatin 100000 UNIT/ML suspension  Commonly known as: MYCOSTATIN  TAKE 5ML BY MOUTH FOUR TIMES DAILY *SHAKE WELL BEFORE EACH USE*   ondansetron 4 MG tablet  Commonly known as: ZOFRAN  Take 1 tablet (4 mg total) by mouth every 8 (eight) hours as needed for nausea or vomiting.   pantoprazole 40 MG tablet  Commonly known as: PROTONIX  TAKE ONE TABLET BY MOUTH TWICE A DAY   prednisoLONE 15 MG/5ML Soln  Commonly known as: PRELONE  Take 10 mg(3.3 ml) once daily x 2 weeks, then decrease to 5 mg (1.7 ml) once daily thereafter until seen in office   QUEtiapine 25 MG tablet  Commonly known as: SEROQUEL  Take 1 tablet (25 mg total) by mouth at bedtime as needed (sleep). May take 2 tablets if needed   SM B SUPER VITAMIN COMPLEX Tabs  Take 1 tablet by mouth daily.   spironolactone 100 MG tablet  Commonly known as: ALDACTONE    thiamine 100 MG tablet  Take 1 tablet (100 mg total) by mouth daily.   triamcinolone 0.025 % ointment  Commonly known      Referrals: None at this time   Aftercare services: Tues 530pm [facility]  1. Attend AA/  as often as you drank 2. Obtain a sponsor and a home group in [place] and do service work in [place] 3. FU with Counselor for individual counseling referral  Next appointment:Aftercare  Plan of Action to Address Continuing Problems:as above    Client has participated in the development of this discharge plan and has received a copy of this completed plan  Mercedes Mitchell  04/27/2017   Mercedes Russian, PA-C 04/27/2017

## 2017-04-27 NOTE — Telephone Encounter (Signed)
Pharmacy calling needing clarification for LOVENOX not sure how much to dispense what was sent is different from original Rx

## 2017-04-27 NOTE — Telephone Encounter (Signed)
Because she gained wt since Rx was last filled dose was slightly higher,77.7 mg instead 70. So she needs 0.77 ml daily (80 mg syringe) and enough to last until 05/12/17.  Thanks, BJ

## 2017-04-27 NOTE — Progress Notes (Unsigned)
Pharmacist [place] called and wanted clarification on the Vitamin B. I spoke to [place] and he said patient needs a therapeutic dose of vitamin B. I called the pharmacist back and she said that there really is no prescription, but that the patient could buy a time release Vitamin B complex OTC that would have therapeutic doses. She made a note in patients chart and will show her what she needs to buy when she comes in.

## 2017-04-27 NOTE — Patient Instructions (Addendum)
A few things to remember from today's visit:   Numbness and tingling of both feet  Deep vein thrombosis (DVT) of femoral vein of right lower extremity, unspecified chronicity (HCC)  Varicose veins of both lower extremities - Plan: triamcinolone (KENALOG) 0.025 % ointment  Acute deep vein thrombosis (DVT) of right femoral vein (HCC) - Plan: enoxaparin (LOVENOX) 60 MG/0.6ML injection  Pruritus of skin - Plan: triamcinolone (KENALOG) 0.025 % ointment  Sinus tachycardia  05/12/17 last Lovenox dose.  Topical steroid around ankle,small amount for itching.Keep area moisturized.  Please monitor heart rate/pulse at home.  Please be sure medication list is accurate. If a new problem present, please set up appointment sooner than planned today.

## 2017-04-27 NOTE — Telephone Encounter (Signed)
I updated the Rx, will you make sure it is correct?  Thanks!

## 2017-04-28 ENCOUNTER — Ambulatory Visit: Payer: Managed Care, Other (non HMO) | Admitting: Physical Therapy

## 2017-04-28 ENCOUNTER — Other Ambulatory Visit (HOSPITAL_COMMUNITY): Payer: 59

## 2017-04-28 ENCOUNTER — Other Ambulatory Visit (HOSPITAL_COMMUNITY): Payer: 59 | Admitting: Psychology

## 2017-04-28 ENCOUNTER — Encounter: Payer: Self-pay | Admitting: Physical Therapy

## 2017-04-28 DIAGNOSIS — K7031 Alcoholic cirrhosis of liver with ascites: Secondary | ICD-10-CM

## 2017-04-28 DIAGNOSIS — M6281 Muscle weakness (generalized): Secondary | ICD-10-CM

## 2017-04-28 DIAGNOSIS — R293 Abnormal posture: Secondary | ICD-10-CM

## 2017-04-28 DIAGNOSIS — F1421 Cocaine dependence, in remission: Secondary | ICD-10-CM

## 2017-04-28 DIAGNOSIS — R262 Difficulty in walking, not elsewhere classified: Secondary | ICD-10-CM | POA: Diagnosis not present

## 2017-04-28 DIAGNOSIS — F1020 Alcohol dependence, uncomplicated: Secondary | ICD-10-CM

## 2017-04-28 NOTE — Therapy (Signed)
[facility] [address], [place], [place], [address] Phone: [phone]   Fax:  [phone]  Physical Therapy Treatment  Patient Details  Name: Mercedes Mitchell MRN: [id number] Date of Birth: 1961-02-07 Referring Provider: [place], Betty G, MD  Encounter Date: 04/28/2017      PT End of Session - 04/28/17 0904    Visit Number 9   Date for PT Re-Evaluation 05/20/17   PT Start Time 0852   PT Stop Time 0940   PT Time Calculation (min) 48 min   Activity Tolerance Patient tolerated treatment well      Past Medical History:  Diagnosis Date  . 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  paracentesis 2.6 liters 5/4: no SBP.    Marland Kitchen Alcoholism ([place]) 12/2016  . Candida esophagitis ([place])   . Cirrhosis ([place])   . DVT (deep venous thrombosis) ([place])   . Dysphagia 2011  . Esophagitis   . Hepatic steatosis   . Hiatal hernia   . Hiatal hernia   . Macrocytic anemia 12/2016  . Malnutrition ([place]) 12/2016  . Panic type anxiety neurosis 2011  . Portal hypertension ([place])   . Portal hypertensive gastropathy [facility])     Past Surgical History:  Procedure Laterality Date  . ESOPHAGOGASTRODUODENOSCOPY N/A 12/25/2016   Procedure: ESOPHAGOGASTRODUODENOSCOPY (EGD);  Surgeon: Jerene Bears, MD;  Location: [facility];  Service: Endoscopy;  Laterality: N/A;  . IR PARACENTESIS  02/02/2017  . IR TRANSCATHETER BX  02/02/2017  . IR US GUIDE VASC ACCESS RIGHT  02/02/2017  . IR VENOGRAM HEPATIC W HEMODYNAMIC EVALUATION  02/02/2017    There were no vitals filed for this visit.      Subjective Assessment - 04/28/17 0929    Subjective Still some soreness in groin but getting better.  Neck is hurting when turning right today due to sleeping in recliner last night.   Pertinent History TMPT   Limitations Walking   How long can you walk comfortably? 20 minutes   Patient Stated Goals  continue getting stronger and work on upper body strength, be able to get up off the floor   Currently in Pain? No/denies            [facility] PT Assessment - 04/28/17 0001      Timed Up and Go Test   TUG Normal TUG  12, 9, 9   Normal TUG (seconds) 10  average of 3                     [facility] Adult PT Treatment/Exercise - 04/28/17 0001      Neuro Re-ed    Neuro Re-ed Details  standing on foam mat - step down, hip abduction and extension and marching 1 min each     Knee/Hip Exercises: Stretches   Hip Flexor Stretch Right;Left;5 reps   Hip Flexor Stretch Limitations on 2nd step   Piriformis Stretch Right;Left;3 reps;10 seconds     Knee/Hip Exercises: Aerobic   Recumbent Bike L2 x 10 min     Modalities   Modalities Moist Heat     Moist Heat Therapy   Number Minutes Moist Heat 5 Minutes   Moist Heat Location Cervical  post treatment                  PT Short Term Goals - 04/26/17 0924      PT SHORT TERM GOAL #  1   Title independent with initial HEP   Status Achieved     PT SHORT TERM GOAL #2   Title pt reports feeling 25% more steady when walking in the community   Status Achieved           PT Long Term Goals - 04/28/17 0916      PT LONG TERM GOAL #1   Title able to get up and down from the floor easier   Time 8   Period Weeks   Status On-going     PT LONG TERM GOAL #2   Title independent with advanced HEP   Time 8   Period Weeks   Status On-going     PT LONG TERM GOAL #3   Title TUG < or = to 12 sec   Baseline 10 sec   Time 8   Period Weeks   Status Achieved     PT LONG TERM GOAL #4   Title Berg > or = to 49/56   Time 8   Period Weeks   Status On-going     PT LONG TERM GOAL #5   Title able to demonstrate greater variation in gait speed based on DGI, due to improved stability    Baseline had noticeable variability of 3 different speeds   Time 8   Period Weeks   Status Achieved               Plan - 04/28/17  0906    Clinical Impression Statement Patient was able to perform exercises on foam mat with close supervision needing occasional UE support to prevent LOB.  Pt was able to meet 2 long term goals today and is demonstrating good improvements overall.  Pt continues to demonstrate improvements and continues to benefit from skilled PT for strength and balance   Rehab Potential Good   PT Treatment/Interventions ADLs/Self Care Home Management;Cryotherapy;Electrical Stimulation;Moist Heat;Therapeutic activities;Therapeutic exercise;Balance training;Neuromuscular re-education;Gait training;Stair training;Manual techniques;Patient/family education;Taping;Dry needling;Passive range of motion;Functional mobility training   PT Next Visit Plan overall strength UE and LE, gait, stairs, balance;  recheck BERG   Consulted and Agree with Plan of Care Patient      Patient will benefit from skilled therapeutic intervention in order to improve the following deficits and impairments:  Abnormal gait, Difficulty walking, Decreased strength, Postural dysfunction, Decreased coordination, Decreased balance  Visit Diagnosis: Difficulty in walking, not elsewhere classified  Muscle weakness (generalized)  Abnormal posture     Problem List Patient Active Problem List   Diagnosis Date Noted  . Sepsis ([place]) 03/26/2017  . Community acquired pneumonia of right middle lobe of lung ([place])   . Sacral decubitus ulcer, stage III ([place]) 02/18/2017  . Abdominal pain 02/10/2017  . Acute deep vein thrombosis (DVT) of right femoral vein ([place]) 02/10/2017  . Hyponatremia 02/10/2017  . DVT (deep venous thrombosis) ([place]) 02/10/2017  . Alcoholic cirrhosis of liver with ascites ([place]) 01/12/2017  . GI bleeding 01/12/2017  . Current every day smoker 01/12/2017  . Ascites   . Coffee ground emesis   . Acute esophagitis   . Protein-calorie malnutrition, severe 12/24/2016  . Acute alcoholic hepatitis 46/96/2952  . ETOH abuse  12/23/2016  . Macrocytic anemia 12/21/2016  . 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 DYSPHAGIA 03/25/2010    Zannie Cove, PT 04/28/2017, 9:35 AM  [facility] [address] [place], [place], [address] Phone: [phone]   Fax:  [phone]  Name: Mercedes Mitchell MRN: [id number]  Date of Birth: 12/01/1960

## 2017-04-29 ENCOUNTER — Encounter (HOSPITAL_COMMUNITY): Payer: Self-pay | Admitting: Psychology

## 2017-04-29 ENCOUNTER — Ambulatory Visit: Payer: Self-pay | Admitting: Family Medicine

## 2017-04-29 NOTE — Progress Notes (Signed)
Daily Group Progress Note  Program: CD-IOP   04/29/2017 [name] [id number]  Diagnosis:  No diagnosis found.   Sobriety Date: 12/24/16  Group Time: 1-2:30pm  Participation Level: Active  Behavioral Response: Sharing  Type of Therapy: Process Group  Interventions: Supportive  Topic: Process: The first half of group was spent in process. Members shared about any speed bumps or shining moments they had experienced since we last met. A new group member was present and she introduced herself. Drug tests were collected from four group members.   Group Time: 2:30-4pm  Participation Level: Active  Behavioral Response: Appropriate  Type of Therapy: Psycho-education Group  Interventions: Strength-based  Topic: Psycho-ed: Popsicle Sticks/Graduation. Members took a Popsicle stick and discussed what the word or phrase written on their stick meant to them relative to their recovery. Members provided feedback and their own perspective e on each other's word. As the session neared the end, a graduation ceremony was held honoring a graduating member. A former group member (who had graduated two sessions ago) appeared, as did the graduating member's husband. Kind words and tears were shed as members spoke highly of the member moving into this next phase of her new and sober life.   Summary: The patient reported that she could not recall any speed bumps, but noted that her shining moment was about a neighbor confiding in her about her own drinking. The neighbor admitted to her that she had stopped drinking entirely. The patient regretted not inviting her to attend an [place] meeting with her, but received helpful feedback from her fellow group members. She agreed she would consider inviting the neighbor to go with her to an [place]. The patient reported she and her husband had traveled to [place] to visit with his brother and the brother's family. They were big partiers and had drunk and  drugged heavily despite their presence. The patient insisted it was not a problem, but had little response when another member questioned their concerns about the lack of respect shown by his family. The patient's word was 'Letting go of control". The patient admitted this had been very difficult for her, but with the tools and strategies she was learning here, she felt as if she was letting go of her need to control. During one discussion with another member, this patient admitted she had not moved or gotten rid of her many alcohol-related glasses, including martini, wine and shot glasses. Despite this repeated recommendations and comments in the many sessions she has been present in and heard about the dangers of "people, places and things", the patient seems oblivious to what science had proven. Prior to the start of the graduation began, the patient's husband and another member who had just graduated at the previous session, both appeared for the ceremony. Kind words and tears were shared as the patient's husband shared is feelings and spoke very heartfelt words. The patient thanked her group and expressed her gratitude for the support she had received. She admitted she had been reluctant to come to this program and was resentful and withdrawn at first. However, quickly she begin to feel very comfortable about disclosing the details of her life. There were tears as the patient shared her gratitude. She leaves with having learned the many skills and strategies she will need going forward. It remains to be seen whether she will use them.   UDS collected: No Results:  AA/NA attended?: YesThursday  Sponsor?: No   Brandon Melnick, LCAS  04/29/2017 8:52 AM

## 2017-04-29 NOTE — Progress Notes (Signed)
Mercedes Mitchell is a 55 y.o. female patient. CD-IOP: Treatment Plan Update. Met with the patient today before her group session. The importance of reviewing her treatment goals was explained and the patient very receptive to a review. It was noted that she is making good progress in remaining drug-free, her first goal of treatment. She is attending meetings, but has not yet secured a sponsor and was encouraged to do so. I emphasized that working the steps was not possible for her without someone to guide her. Gaining support for her recovery was her second treatment goal. The patient noted that she had attended two AA meetings in the last two days, including a speaker meeting last night and the 10 am meeting today. The patient's third goal of treatment related to doing everything she could to enhance the outcome of her treatment for cirrhosis. She has gained strength and weight and her numbers appear to be improving. I pointed out that when she first entered treatment, she had a walker and couldn't get up out of a chair by herself. She has improved markedly and it to be applauded for the efforts and progress she has made. Overall, the patient is doing well and has displayed a willingness and determination to be admired. The treatment plan update was reviewed, signed and completed. We will continue to follow this patient closely in the days and weeks ahead.            Lisia [place], LCAS 

## 2017-04-29 NOTE — Progress Notes (Signed)
Mercedes Mitchell is a 55 y.o. female patient.  CD-IOP: Discharge Plan. Met with the patient and her husband this afternoon. The patient successfully completed the CD-IOP yesterday and a graduation ceremony was held in the session. Today, we reviewed her discharge plan. The patient reported she intended to continue attending AA meetings regularly and was speaking with a woman she was considering as a sponsor. She also stated the intention to attend the weekly Aftercare meeting held here every Wednesday evening from 5:30-6:30, with Wes Swan as the facilitator. The patient's husband assured me that they have both committed to a program of total abstinence and he accepted that he must stop drinking as well. He noted that he feels so much better and since he is diabetic, it is important for him to remain healthy and keep his blood sugar at appropriate levels. The patient and her husband pointed out that they have really only known each other drinking and this is a new way of being. They both agreed that they are getting along much better being sober. The documentation was reviewed and signed and the discharge plan completed accordingly. The patient leaves here with a good understanding of her daily recovery needs and the chronic nature of this disease.          , LCAS 

## 2017-04-29 NOTE — Progress Notes (Signed)
Mercedes Mitchell is a 56 y.o. female patient. CD-IOP: Treatment Plan Update. Met with the patient today before her group session. The importance of reviewing her treatment goals was explained and the patient very receptive to a review. It was noted that she is making good progress in remaining drug-free, her first goal of treatment. She is attending meetings, but has not yet secured a sponsor and was encouraged to do so. I emphasized that working the steps was not possible for her without someone to guide her. Gaining support for her recovery was her second treatment goal. The patient noted that she had attended two [place] meetings in the last two days, including a speaker meeting last night and the 10 am meeting today. The patient's third goal of treatment related to doing everything she could to enhance the outcome of her treatment for cirrhosis. She has gained strength and weight and her numbers appear to be improving. I pointed out that when she first entered treatment, she had a walker and couldn't get up out of a chair by herself. She has improved markedly and it to be applauded for the efforts and progress she has made. Overall, the patient is doing well and has displayed a willingness and determination to be admired. The treatment plan update was reviewed, signed and completed. We will continue to follow this patient closely in the days and weeks ahead.            Brandon Melnick, LCAS

## 2017-04-29 NOTE — Progress Notes (Signed)
    Daily Group Progress Note  Program: CD-IOP   04/29/2017 [name] [id number]  Diagnosis:  Alcohol use disorder, severe, dependence ([place])  Cocaine use disorder, severe, in early remission, dependence ([place])   Sobriety Date: 5/4  Group Time: 1-2:30  Participation Level: Active  Behavioral Response: Appropriate and Sharing  Type of Therapy: Process Group  Interventions: CBT  Topic: Pts were active and engaged for process group. Therapist led discussion on pts' recent challenges and successes in early recovery. Counselor encouraged pt to discuss skills they had employed to combat relapse and avoid triggers/cravings to use. UDS were collected from some members. One member became upset when FMLA paperwork could not be signed in the time period she was requesting. She became agitated in group but eventually became less activated. Pts shared openly and offered each other supportive feedback.      Group Time: 2:30-4  Participation Level: Active  Behavioral Response: Appropriate and Sharing  Type of Therapy: Psycho-education Group  Interventions: CBT  Topic: Pts were active and engaged for psychoeducation group. Therapist led discussion on utilizing a "though record" to gain awareness of triggering situations. Thoughts, behaviors, and emotions were discussed and counselor helped pt find "alternative thoughts" to help cope w/ distress. Pts were provided w/ a handout and asked to fill out the appropriate columns.    Summary: Pt was active and engaged for session. She reported she attended 2 AA meetings since last group. She reported on a recent trip out of town to visit her husbands family. She reports she was not tempted to go to a brewery when her sons told her they were going. Pt shared openly about her upcoming graduation and the fact that she has learned to be a more open person and can now tell when she is "hiding something" from others. Pt reports she will continue  care in Aftercare group and was encouraged to seek a sponsor.    UDS collected: No Results: Most recent negative  AA/NA attended?: YesMonday and Tuesday  Sponsor?: No   Youlanda Roys, LPCA LCASA 04/29/2017 3:07 PM

## 2017-05-02 ENCOUNTER — Other Ambulatory Visit (INDEPENDENT_AMBULATORY_CARE_PROVIDER_SITE_OTHER): Payer: Managed Care, Other (non HMO)

## 2017-05-02 ENCOUNTER — Ambulatory Visit (INDEPENDENT_AMBULATORY_CARE_PROVIDER_SITE_OTHER): Payer: Managed Care, Other (non HMO) | Admitting: Internal Medicine

## 2017-05-02 ENCOUNTER — Encounter: Payer: Self-pay | Admitting: Internal Medicine

## 2017-05-02 ENCOUNTER — Other Ambulatory Visit (HOSPITAL_COMMUNITY): Payer: 59

## 2017-05-02 VITALS — BP 106/68 | HR 120 | Ht 66.0 in | Wt 113.0 lb

## 2017-05-02 DIAGNOSIS — K7031 Alcoholic cirrhosis of liver with ascites: Secondary | ICD-10-CM | POA: Diagnosis not present

## 2017-05-02 DIAGNOSIS — K7460 Unspecified cirrhosis of liver: Secondary | ICD-10-CM

## 2017-05-02 DIAGNOSIS — K7011 Alcoholic hepatitis with ascites: Secondary | ICD-10-CM

## 2017-05-02 DIAGNOSIS — R935 Abnormal findings on diagnostic imaging of other abdominal regions, including retroperitoneum: Secondary | ICD-10-CM

## 2017-05-02 LAB — CBC WITH DIFFERENTIAL/PLATELET
BASOS ABS: 0.1 10*3/uL (ref 0.0–0.1)
BASOS PCT: 0.5 % (ref 0.0–3.0)
EOS ABS: 0 10*3/uL (ref 0.0–0.7)
Eosinophils Relative: 0.2 % (ref 0.0–5.0)
HCT: 42.1 % (ref 36.0–46.0)
HEMOGLOBIN: 14.1 g/dL (ref 12.0–15.0)
LYMPHS ABS: 1.2 10*3/uL (ref 0.7–4.0)
LYMPHS PCT: 10.9 % — AB (ref 12.0–46.0)
MCHC: 33.5 g/dL (ref 30.0–36.0)
MCV: 99.8 fl (ref 78.0–100.0)
MONO ABS: 1 10*3/uL (ref 0.1–1.0)
Monocytes Relative: 9.8 % (ref 3.0–12.0)
NEUTROS ABS: 8.4 10*3/uL — AB (ref 1.4–7.7)
Neutrophils Relative %: 78.6 % — ABNORMAL HIGH (ref 43.0–77.0)
Platelets: 346 10*3/uL (ref 150.0–400.0)
RBC: 4.22 Mil/uL (ref 3.87–5.11)
RDW: 13.9 % (ref 11.5–15.5)
WBC: 10.7 10*3/uL — ABNORMAL HIGH (ref 4.0–10.5)

## 2017-05-02 LAB — COMPREHENSIVE METABOLIC PANEL
ALT: 24 U/L (ref 0–35)
AST: 37 U/L (ref 0–37)
Albumin: 3.9 g/dL (ref 3.5–5.2)
Alkaline Phosphatase: 162 U/L — ABNORMAL HIGH (ref 39–117)
BILIRUBIN TOTAL: 1.5 mg/dL — AB (ref 0.2–1.2)
BUN: 10 mg/dL (ref 6–23)
CHLORIDE: 92 meq/L — AB (ref 96–112)
CO2: 27 meq/L (ref 19–32)
CREATININE: 0.88 mg/dL (ref 0.40–1.20)
Calcium: 10 mg/dL (ref 8.4–10.5)
GFR: 70.91 mL/min (ref >60.00)
GLUCOSE: 94 mg/dL (ref 70–99)
Potassium: 5 mEq/L (ref 3.5–5.1)
SODIUM: 130 meq/L — AB (ref 135–145)
Total Protein: 7.7 g/dL (ref 6.0–8.3)

## 2017-05-02 LAB — PROTIME-INR
INR: 1.3 ratio — AB (ref 0.8–1.0)
Prothrombin Time: 13.8 s — ABNORMAL HIGH (ref 9.6–13.1)

## 2017-05-02 NOTE — Patient Instructions (Signed)
Follow up with Dr Hilarie Fredrickson in 3 months.  Discontinue your prednisolone this Thursday.   Please continue your lasix, aldactone, pantoprazole and lactulose  Your physician has requested that you go to the basement for the following lab work before leaving today: CMP, CBC, INR  You have been scheduled for a CT scan of the abdomen and pelvis at [facility] ([address]---this is in the same building as Press [facility]).   You are scheduled on Friday, 05/06/17 at 3:30 pm. You should arrive 15 minutes prior to your appointment time for registration. Please follow the written instructions below on the day of your exam:  WARNING: IF YOU ARE ALLERGIC TO IODINE/X-RAY DYE, PLEASE NOTIFY RADIOLOGY IMMEDIATELY AT [phone]! YOU WILL BE GIVEN A 13 HOUR PREMEDICATION PREP.  1) Do not eat or drink anything after 11:30 am (4 hours prior to your test) 2) You have been given 2 bottles of oral contrast to drink. The solution may taste better if refrigerated, but do NOT add ice or any other liquid to this solution. Shake well before drinking.    Drink 1 bottle of contrast @ 1:30 pm (2 hours prior to your exam)  Drink 1 bottle of contrast @ 2:30 pm (1 hour prior to your exam)  You may take any medications as prescribed with a small amount of water except for the following: Metformin, Glucophage, Glucovance, Avandamet, Riomet, Fortamet, Actoplus Met, Janumet, Glumetza or Metaglip. The above medications must be held the day of the exam AND 48 hours after the exam.  The purpose of you drinking the oral contrast is to aid in the visualization of your intestinal tract. The contrast solution may cause some diarrhea. Before your exam is started, you will be given a small amount of fluid to drink. Depending on your individual set of symptoms, you may also receive an intravenous injection of x-ray contrast/dye. Plan on being at [facility] for 30 minutes or longer, depending on the type of exam  you are having performed.  This test typically takes 30-45 minutes to complete.  If you have any questions regarding your exam or if you need to reschedule, you may call the CT department at [phone] between the hours of 8:00 am and 5:00 pm, Monday-Friday.  ________________________________________________________________________ If you are age 71 or older, your body mass index should be between 23-30. Your Body mass index is 18.24 kg/m. If this is out of the aforementioned range listed, please consider follow up with your Primary Care Provider.  If you are age 56 or younger, your body mass index should be between 19-25. Your Body mass index is 18.24 kg/m. If this is out of the aformentioned range listed, please consider follow up with your Primary Care Provider.

## 2017-05-02 NOTE — Progress Notes (Signed)
Subjective:    Patient ID: Mercedes Mitchell, female    DOB: Jun 30, 1961, 56 y.o.   MRN: [id number]  HPI Aronda Burford is a 56 year old female with a history of decompensated alcoholic cirrhosis, history of alcoholic hepatitis, ascites, hepatic encephalopathy, portal hypertensive gastropathy, right lower extremity DVT who is here for follow-up. She was last seen in our office on 02/18/2017. She's been following with [facility] also.  She reports that she is doing and feeling better. She reports no longer being jaundiced though she is still having some itching. She denies abdominal pain. She has not needed further paracenteses and continues Lasix 80 mg daily and Aldactone 100 mg though there is some question of the dose. She does not feel issues with increasing abdominal distention and has not had lower extremity edema. She has weaned her prednisone dose down to 5 mg daily over the last 11 days. She's been completely alcohol free since 12/23/2016 and completed her intensive outpatient alcohol rehabilitation. She is still attending alcohol rehabilitation prevention classes. She is using lactulose 10 mL's 3 times a day in having 2-3 bowel movements per day occasionally they are loose. Nonbloody and non-melenic. She is undergoing physical therapy as an outpatient. She continues Lovenox injections daily until later this month under direction of hematology.  She was hospitalized with pneumonia 1 month ago and is off antibiotics and feels that she has recovered well.  She saw living management for her sacral decubitus ulcer and reports this has healed completely.   Review of Systems As per history of present illness, otherwise negative  Current Medications, Allergies, Past Medical History, Past Surgical History, Family History and Social History were reviewed in [employer] record.     Objective:   Physical Exam BP 106/68 (BP Location: Left Arm, Patient Position: Sitting,  Cuff Size: Normal)   Pulse (!) 120   Ht 5\' 6"  (1.676 m)   Wt 113 lb (51.3 kg)   BMI 18.24 kg/m  Constitutional: Well-developed Chronically ill-appearing female No distress. HEENT: Normocephalic and atraumatic. Oropharynx is clear and moist. Conjunctivae are normal.  No scleral icterus. Neck: Neck supple. Trachea midline. Cardiovascular: Normal rate, regular rhythm and intact distal pulses.  Pulmonary/chest: Effort normal and breath sounds normal. No wheezing, rales or rhonchi. Abdominal: Soft, nontender,  mild distention without fluid wave,  Bowel sounds active throughout. 3 cm hepatomegaly below the right costal margin  Extremities: no clubbing, cyanosis, or edema Neurological: Alert and oriented to person place and time.No asterixis  Skin: Skin is warm and dry, bruising and hemosiderin bilateral legs and arms  Psychiatric: Normal mood and affect. Behavior is normal.  CBC    Component Value Date/Time   WBC 10.7 (H) 05/02/2017 1539   RBC 4.22 05/02/2017 1539   HGB 14.1 05/02/2017 1539   HCT 42.1 05/02/2017 1539   HCT 23.3 (L) 12/25/2016 0654   PLT 346.0 05/02/2017 1539   MCV 99.8 05/02/2017 1539   MCH 34.6 (H) 03/28/2017 0440   MCHC 33.5 05/02/2017 1539   RDW 13.9 05/02/2017 1539   LYMPHSABS 1.2 05/02/2017 1539   MONOABS 1.0 05/02/2017 1539   EOSABS 0.0 05/02/2017 1539   BASOSABS 0.1 05/02/2017 1539   CMP     Component Value Date/Time   NA 130 (L) 05/02/2017 1539   K 5.0 05/02/2017 1539   CL 92 (L) 05/02/2017 1539   CO2 27 05/02/2017 1539   GLUCOSE 94 05/02/2017 1539   BUN 10 05/02/2017 1539   CREATININE  0.88 05/02/2017 1539   CALCIUM 10.0 05/02/2017 1539   PROT 7.7 05/02/2017 1539   ALBUMIN 3.9 05/02/2017 1539   AST 37 05/02/2017 1539   ALT 24 05/02/2017 1539   ALKPHOS 162 (H) 05/02/2017 1539   BILITOT 1.5 (H) 05/02/2017 1539   GFRNONAA >60 03/28/2017 0440   GFRAA >60 03/28/2017 0440   Lab Results  Component Value Date   INR 1.3 (H) 05/02/2017   INR 1.3 (A)  03/02/2017   INR 1.53 02/19/2017       Assessment & Plan:   56 year old female with a history of decompensated alcoholic cirrhosis, history of alcoholic hepatitis, ascites, hepatic encephalopathy, portal hypertensive gastropathy, right lower extremity DVT who is here for follow-up.  1. Alcoholic cirrhosis -- she has made significant improvement over the last several months. Fortunately she has remained alcohol abstinent with no plans to drink again. She is undergoing relapse prevention counseling. --Ascites -- improved. Low sodium diet again recommended. Will continue Lasix 80 mg daily and Aldactone 200 mg daily for now --Continue alcohol relapse prevention --Hepatic encephalopathy -- under good control with lactulose. Continue lactulose 3 times a day, titrate to 2-4 saw performed stools daily --Continue follow-up with [facility] medical liver clinic --Portal gastropathy -- continue PPI. No evidence of esophageal varices as of May 4270 --Alcoholic hepatitis-- we have successfully weaned prednisolone to 5 mg. Liver enzymes have improved dramatically. We'll have her discontinue steroids altogether this Thursday, 05/05/2017 --2-3 month follow-up with me --CBC, CMP and INR today  2. History of abnormal pancreas by CT -- repeat CT recommended of the abdomen and pelvis with IV contrast to evaluate pancreatic head fullness seen in May 2018  3. Malnutrition -- due to liver disease and improving. Continue high protein diet  4. Sacral decubitus ulceration -- healed after wound care involvement  5. DVT -- on therapeutic Lovenox, defer to hematology and primary care  25 minutes spent with the patient today. Greater than 50% was spent in counseling and coordination of care with the patient

## 2017-05-03 ENCOUNTER — Ambulatory Visit: Payer: Managed Care, Other (non HMO) | Admitting: Physical Therapy

## 2017-05-03 DIAGNOSIS — M6281 Muscle weakness (generalized): Secondary | ICD-10-CM

## 2017-05-03 DIAGNOSIS — R293 Abnormal posture: Secondary | ICD-10-CM

## 2017-05-03 DIAGNOSIS — R262 Difficulty in walking, not elsewhere classified: Secondary | ICD-10-CM

## 2017-05-03 NOTE — Therapy (Signed)
[facility] [address] [place], [place], [address] Phone: [phone]   Fax:  [phone]  Physical Therapy Treatment  Patient Details  Name: Mercedes Mitchell MRN: [id number] Date of Birth: Nov 27, 1960 Referring Provider: [place], Betty G, MD  Encounter Date: 05/03/2017      PT End of Session - 05/03/17 1553    Visit Number 10   Date for PT Re-Evaluation 05/20/17   PT Start Time 1532   PT Stop Time 1610   PT Time Calculation (min) 38 min   Activity Tolerance Patient tolerated treatment well      Past Medical History:  Diagnosis Date  . 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  paracentesis 2.6 liters 5/4: no SBP.    Marland Kitchen Alcoholism ([place]) 12/2016  . Candida esophagitis ([place])   . Cirrhosis ([place])   . DVT (deep venous thrombosis) ([place])   . Dysphagia 2011  . Esophagitis   . Hepatic steatosis   . Hiatal hernia   . Hiatal hernia   . Macrocytic anemia 12/2016  . Malnutrition ([place]) 12/2016  . Panic type anxiety neurosis 2011  . Portal hypertension ([place])   . Portal hypertensive gastropathy [facility])     Past Surgical History:  Procedure Laterality Date  . ESOPHAGOGASTRODUODENOSCOPY N/A 12/25/2016   Procedure: ESOPHAGOGASTRODUODENOSCOPY (EGD);  Surgeon: Jerene Bears, MD;  Location: [facility];  Service: Endoscopy;  Laterality: N/A;  . IR PARACENTESIS  02/02/2017  . IR TRANSCATHETER BX  02/02/2017  . IR US GUIDE VASC ACCESS RIGHT  02/02/2017  . IR VENOGRAM HEPATIC W HEMODYNAMIC EVALUATION  02/02/2017    There were no vitals filed for this visit.      Subjective Assessment - 05/03/17 1534    Subjective I'm feeling good.  I saw the doctor yesterday.   He thinks my electrolytes might be off.  My balance is getting better.  I did catch myself when I slipped on some wet leaves.     Patient Stated Goals continue getting stronger and work on upper body  strength, be able to get up off the floor   Currently in Pain? No/denies   Pain Score 0-No pain            [facility] PT Assessment - 05/03/17 0001      Berg Balance Test   Sit to Stand Able to stand without using hands and stabilize independently   Standing Unsupported Able to stand safely 2 minutes   Sitting with Back Unsupported but Feet Supported on Floor or Stool Able to sit safely and securely 2 minutes   Stand to Sit Sits safely with minimal use of hands   Transfers Able to transfer safely, minor use of hands   Standing Unsupported with Eyes Closed Able to stand 10 seconds safely   Standing Ubsupported with Feet Together Able to place feet together independently and stand 1 minute safely   From Standing, Reach Forward with Outstretched Arm Can reach forward >12 cm safely (5")   From Standing Position, Pick up Object from Floor Able to pick up shoe safely and easily   From Standing Position, Turn to Look Behind Over each Shoulder Looks behind from both sides and weight shifts well   Turn 360 Degrees Able to turn 360 degrees safely one side only in 4 seconds or less   Standing Unsupported, Alternately Place Feet on Step/Stool Able to stand independently and complete  8 steps >20 seconds   Standing Unsupported, One Foot in Front Able to take small step independently and hold 30 seconds   Standing on One Leg Tries to lift leg/unable to hold 3 seconds but remains standing independently   Total Score 48                     [facility] Adult PT Treatment/Exercise - 05/03/17 0001      Therapeutic Activites    Therapeutic Activities ADL's   ADL's getting up off the floor  2x no UE use needed from furniture     Knee/Hip Exercises: Aerobic   Recumbent Bike L2 x 10 min     Knee/Hip Exercises: Standing   Hip Abduction Stengthening;Right;Left;10 reps   Abduction Limitations red band   Hip Extension Stengthening;10 reps   Extension Limitations red band   Forward Step Up  Right;Left;10 reps;Step Height: 6";Hand Hold: 0   Other Standing Knee Exercises foam roll push downs 10x     Shoulder Exercises: Standing   Other Standing Exercises wall ex:  snow angels, overheads. isometric extensions, yellow band horizontal abduction, diagonals 8x each   Other Standing Exercises wall push ups and wall planks 10x each                  PT Short Term Goals - 05/03/17 1702      PT SHORT TERM GOAL #1   Title independent with initial HEP   Status Achieved     PT SHORT TERM GOAL #2   Title pt reports feeling 25% more steady when walking in the community   Status Achieved           PT Long Term Goals - 05/03/17 1702      PT LONG TERM GOAL #1   Title able to get up and down from the floor easier   Status Achieved     PT LONG TERM GOAL #2   Title independent with advanced HEP   Time 8   Period Weeks   Status On-going     PT LONG TERM GOAL #3   Title TUG < or = to 12 sec   Status Achieved     PT LONG TERM GOAL #4   Title Berg > or = to 49/56   Time 8   Period Weeks   Status On-going     PT LONG TERM GOAL #5   Title able to demonstrate greater variation in gait speed based on DGI, due to improved stability    Status Achieved               Plan - 05/03/17 1603    Clinical Impression Statement Excellent improvement in BERG balance score to 48/56.  Difficulty with single balance and tandem stand.  Much improved sit to stand without UEs and now able to get up off the floor without pulling up from furniture.  Good progress toward rehab goals and should meet remaining goals in next 2 weeks.     Rehab Potential Good   PT Frequency 2x / week   PT Duration 8 weeks   PT Treatment/Interventions ADLs/Self Care Home Management;Cryotherapy;Electrical Stimulation;Moist Heat;Therapeutic activities;Therapeutic exercise;Balance training;Neuromuscular re-education;Gait training;Stair training;Manual techniques;Patient/family education;Taping;Dry  needling;Passive range of motion;Functional mobility training   PT Next Visit Plan overall strength UE and LE, gait, stairs, balance      Patient will benefit from skilled therapeutic intervention in order to improve the following deficits and impairments:  Abnormal gait, Difficulty  walking, Decreased strength, Postural dysfunction, Decreased coordination, Decreased balance  Visit Diagnosis: Difficulty in walking, not elsewhere classified  Muscle weakness (generalized)  Abnormal posture     Problem List Patient Active Problem List   Diagnosis Date Noted  . Sepsis ([place]) 03/26/2017  . Community acquired pneumonia of right middle lobe of lung ([place])   . Sacral decubitus ulcer, stage III ([place]) 02/18/2017  . Abdominal pain 02/10/2017  . Acute deep vein thrombosis (DVT) of right femoral vein ([place]) 02/10/2017  . Hyponatremia 02/10/2017  . DVT (deep venous thrombosis) ([place]) 02/10/2017  . Alcoholic cirrhosis of liver with ascites ([place]) 01/12/2017  . GI bleeding 01/12/2017  . Current every day smoker 01/12/2017  . Ascites   . Coffee ground emesis   . Acute esophagitis   . Protein-calorie malnutrition, severe 12/24/2016  . Acute alcoholic hepatitis 69/45/0388  . ETOH abuse 12/23/2016  . Macrocytic anemia 12/21/2016  . 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 DYSPHAGIA 03/25/2010   Ruben Im, PT 05/03/17 5:04 PM Phone: [phone] Fax: [phone]  Alvera Singh 05/03/2017, 5:04 PM  [facility] [address], [place], [place], [address] Phone: [phone]   Fax:  [phone]  Name: Mercedes Mitchell MRN: [id number] Date of Birth: 05/26/1961

## 2017-05-03 NOTE — Progress Notes (Signed)
Error

## 2017-05-04 ENCOUNTER — Other Ambulatory Visit (HOSPITAL_COMMUNITY): Payer: 59

## 2017-05-04 ENCOUNTER — Encounter (HOSPITAL_COMMUNITY): Payer: Self-pay | Admitting: Licensed Clinical Social Worker

## 2017-05-04 ENCOUNTER — Ambulatory Visit (INDEPENDENT_AMBULATORY_CARE_PROVIDER_SITE_OTHER): Payer: 59 | Admitting: Licensed Clinical Social Worker

## 2017-05-04 DIAGNOSIS — F1020 Alcohol dependence, uncomplicated: Secondary | ICD-10-CM | POA: Diagnosis not present

## 2017-05-04 DIAGNOSIS — F1421 Cocaine dependence, in remission: Secondary | ICD-10-CM

## 2017-05-04 NOTE — Progress Notes (Signed)
  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 She reported that since d/c from [place] last week she has struggled to fill her time, but she is attending more [employer] meetings and pursuing a job. She admitted she scheduled and then canceled a job interview bc she felt "overwhelmed by all the remaining doctors appointments".   Summary of Group: Counselor led mindfulness check in exercise in which pt's rated their current mood from 1-10 and why. Counselor encouraged pts to share about their weeks and challenges w/ anxiety, depression, and/or substance abuse issues.   Archie Balboa, LPCA, LCASA

## 2017-05-05 ENCOUNTER — Other Ambulatory Visit (HOSPITAL_COMMUNITY): Payer: 59

## 2017-05-05 LAB — BASIC METABOLIC PANEL: Glucose: 93

## 2017-05-06 ENCOUNTER — Ambulatory Visit (INDEPENDENT_AMBULATORY_CARE_PROVIDER_SITE_OTHER)
Admission: RE | Admit: 2017-05-06 | Discharge: 2017-05-06 | Disposition: A | Discharge status: Home / Self Care | Payer: Managed Care, Other (non HMO) | Source: Ambulatory Visit | Attending: Internal Medicine | Admitting: Internal Medicine

## 2017-05-06 DIAGNOSIS — K7031 Alcoholic cirrhosis of liver with ascites: Secondary | ICD-10-CM | POA: Diagnosis not present

## 2017-05-06 MED ORDER — IOPAMIDOL (ISOVUE-300) INJECTION 61%
100.0000 mL | Freq: Once | INTRAVENOUS | Status: AC | PRN
  Administered 2017-05-06: 100 mL via INTRAVENOUS

## 2017-05-08 ENCOUNTER — Emergency Department (HOSPITAL_COMMUNITY)
Admission: EM | Admit: 2017-05-08 | Discharge: 2017-05-08 | Disposition: A | Discharge status: Home / Self Care | Payer: Managed Care, Other (non HMO) | Attending: Emergency Medicine | Admitting: Emergency Medicine

## 2017-05-08 ENCOUNTER — Emergency Department (HOSPITAL_COMMUNITY): Payer: Managed Care, Other (non HMO)

## 2017-05-08 ENCOUNTER — Encounter (HOSPITAL_COMMUNITY): Payer: Self-pay | Admitting: Emergency Medicine

## 2017-05-08 DIAGNOSIS — Z79899 Other long term (current) drug therapy: Secondary | ICD-10-CM | POA: Diagnosis not present

## 2017-05-08 DIAGNOSIS — Z7901 Long term (current) use of anticoagulants: Secondary | ICD-10-CM | POA: Diagnosis not present

## 2017-05-08 DIAGNOSIS — Z87891 Personal history of nicotine dependence: Secondary | ICD-10-CM | POA: Diagnosis not present

## 2017-05-08 DIAGNOSIS — J209 Acute bronchitis, unspecified: Secondary | ICD-10-CM | POA: Diagnosis not present

## 2017-05-08 DIAGNOSIS — R05 Cough: Secondary | ICD-10-CM | POA: Diagnosis present

## 2017-05-08 MED ORDER — AMOXICILLIN-POT CLAVULANATE 400-57 MG/5ML PO SUSR: 35.0000 mg/kg/d | Freq: Three times a day (TID) | ORAL | 0 refills | Status: DC

## 2017-05-08 MED ORDER — AMOXICILLIN-POT CLAVULANATE 875-125 MG PO TABS: 1.0000 | ORAL_TABLET | Freq: Two times a day (BID) | ORAL | 0 refills | Status: DC

## 2017-05-08 NOTE — ED Notes (Signed)
MD at bedside. 

## 2017-05-08 NOTE — Discharge Instructions (Signed)
Continue taking your usual medications.  Make sure you are getting plenty of dress and drinking her usual amount of fluids.  Take the antibiotic medicine as directed.

## 2017-05-08 NOTE — ED Provider Notes (Signed)
[facility] DEPT Provider Note   CSN: [id number] Arrival date & time: 05/08/17  1223     History   Chief Complaint Chief Complaint  Patient presents with  . Cough    HPI Mercedes Mitchell is a 55 y.o. female.  She presents for evaluation of cough productive of sputum, which is blood-tinged, for 1 day.  She has mild myalgia.  She has chronic ongoing abdominal discomfort which is unchanged.  She does not have increased abdominal swelling at this time.  She denies shortness of breath, nausea, vomiting, focal weakness or paresthesia.  There is no chest pain.  She continues to take Lovenox, to treat PE/DVT.  No other known modifying factors.  HPI  Past Medical History:  Diagnosis Date  . 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  paracentesis 2.6 liters 5/4: no SBP.    Marland Kitchen Alcoholism ([place]) 12/2016  . Candida esophagitis ([place])   . Cirrhosis ([place])   . DVT (deep venous thrombosis) ([place])   . Dysphagia 2011  . Esophagitis   . Hepatic steatosis   . Hiatal hernia   . Hiatal hernia   . Macrocytic anemia 12/2016  . Malnutrition ([place]) 12/2016  . Panic type anxiety neurosis 2011  . Portal hypertension ([place])   . Portal hypertensive gastropathy [facility])     Patient Active Problem List   Diagnosis Date Noted  . Sepsis ([place]) 03/26/2017  . Community acquired pneumonia of right middle lobe of lung ([place])   . Sacral decubitus ulcer, stage III ([place]) 02/18/2017  . Abdominal pain 02/10/2017  . Acute deep vein thrombosis (DVT) of right femoral vein ([place]) 02/10/2017  . Hyponatremia 02/10/2017  . DVT (deep venous thrombosis) ([place]) 02/10/2017  . Alcoholic cirrhosis of liver with ascites ([place]) 01/12/2017  . GI bleeding 01/12/2017  . Current every day smoker 01/12/2017  . Ascites   . Coffee ground emesis   . Acute esophagitis   . Protein-calorie malnutrition, severe 12/24/2016  . Acute alcoholic hepatitis 56/38/7564  . ETOH  abuse 12/23/2016  . Macrocytic anemia 12/21/2016  . 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 DYSPHAGIA 03/25/2010    Past Surgical History:  Procedure Laterality Date  . ESOPHAGOGASTRODUODENOSCOPY N/A 12/25/2016   Procedure: ESOPHAGOGASTRODUODENOSCOPY (EGD);  Surgeon: Jerene Bears, MD;  Location: [facility];  Service: Endoscopy;  Laterality: N/A;  . IR PARACENTESIS  02/02/2017  . IR TRANSCATHETER BX  02/02/2017  . IR US GUIDE VASC ACCESS RIGHT  02/02/2017  . IR VENOGRAM HEPATIC W HEMODYNAMIC EVALUATION  02/02/2017    OB History    No data available       Home Medications    Prior to Admission medications   Medication Sig Start Date End Date Taking? Authorizing Provider  amoxicillin-clavulanate (AUGMENTIN) 875-125 MG tablet Take 1 tablet by mouth 2 (two) times daily. One po bid x 7 days 05/08/17   Daleen Bo, MD  B Complex-C-Folic Acid (SM B SUPER VITAMIN COMPLEX) TABS Take 1 tablet by mouth daily. 04/27/17 04/27/18  Dara Hoyer, PA-C  camphor-menthol [facility]) lotion Apply topically as needed for itching. 04/12/17   [place], Betty G, MD  enoxaparin (LOVENOX) 80 MG/0.8ML injection  04/29/17   [provider]  feeding supplement, ENSURE ENLIVE, (ENSURE ENLIVE) LIQD Take 237 mLs by mouth 3 (three) times daily between meals. 12/28/16   Mikhail, Velta Addison, DO  folic acid (FOLVITE) 1 MG tablet Take 1 tablet (1 mg total) by mouth  daily. 02/16/17   Dara Hoyer, PA-C  furosemide (LASIX) 20 MG tablet Take 4 tablets (80 mg total) by mouth daily. 02/19/17   Hongalgi, Lenis Dickinson, MD  hydrocerin (EUCERIN) CREA Apply 1 application topically daily. Apply to bilateral legs once daily 03/07/17   [place], Betty G, MD  lactulose (CHRONULAC) 10 GM/15ML solution Take 30 mLs (20 g total) by mouth 2 (two) times daily. 02/18/17   Pyrtle, Lajuan Lines, MD  Multiple Vitamin (MULTIVITAMIN WITH MINERALS) TABS tablet Take 1 tablet by mouth daily. 12/29/16   Mikhail, Velta Addison, DO  mupirocin cream (BACTROBAN) 2 % Apply 1  application topically 2 (two) times daily. 04/04/17   [place], Betty G, MD  nystatin (MYCOSTATIN) 100000 UNIT/ML suspension TAKE 5ML BY MOUTH FOUR TIMES DAILY   *SHAKE WELL BEFORE EACH USE* 04/11/17   [place], Betty G, MD  ondansetron (ZOFRAN) 4 MG tablet Take 1 tablet (4 mg total) by mouth every 8 (eight) hours as needed for nausea or vomiting. Patient not taking: Reported on 05/02/2017 02/13/17   Aline August, MD  pantoprazole (PROTONIX) 40 MG tablet TAKE ONE TABLET BY MOUTH TWICE A DAY 04/22/17   Pyrtle, Lajuan Lines, MD  prednisoLONE (PRELONE) 15 MG/5ML SOLN Take 10 mg(3.3 ml) once daily x 2 weeks, then decrease to 5 mg (1.7 ml) once daily thereafter until seen in office Patient not taking: Reported on 05/08/2017 04/15/17   Gatha Mayer, MD  QUEtiapine (SEROQUEL) 25 MG tablet Take 1 tablet (25 mg total) by mouth at bedtime as needed (sleep). May take 2 tablets if needed 04/27/17 04/27/18  Dara Hoyer, PA-C  spironolactone (ALDACTONE) 100 MG tablet  04/05/17   [provider]  thiamine 100 MG tablet Take 1 tablet (100 mg total) by mouth daily. 12/29/16   Mikhail, Velta Addison, DO  triamcinolone (KENALOG) 0.025 % ointment Apply 1 application topically 2 (two) times daily. 04/27/17   [place]ique, Betty G, MD    Family History Family History  Problem Relation Age of Onset  . Hyperlipidemia Father   . Heart disease Father   . Alcoholism Father   . Heart attack Father   . Alcoholism Mother   . Alcoholism Sister     Social History Social History  Substance Use Topics  . Smoking status: Former Smoker    Types: Cigarettes  . Smokeless tobacco: Never Used     Comment: Reports she quit "about 6 months ago"  . Alcohol use Yes     Comment: Last drink: 36 days ago      Allergies   Adhesive [tape]   Review of Systems Review of Systems  All other systems reviewed and are negative.    Physical Exam Updated Vital Signs BP 110/87 (BP Location: Left Arm)   Pulse 93   Temp 98 F (36.7 C) (Oral)    Resp 20   Ht 5' (1.524 m)   Wt 51.3 kg (113 lb)   SpO2 98%   BMI 22.07 kg/m   Physical Exam  Constitutional: She is oriented to person, place, and time. She appears well-developed.  Appears older than stated age.  HENT:  Head: Normocephalic and atraumatic.  Eyes: Pupils are equal, round, and reactive to light. Conjunctivae and EOM are normal.  Neck: Normal range of motion and phonation normal. Neck supple.  Cardiovascular: Normal rate and regular rhythm.   Pulmonary/Chest: Effort normal and breath sounds normal. No respiratory distress. She has no wheezes. She exhibits no tenderness.  Abdominal: Soft. She exhibits no distension.  There is no tenderness. There is no guarding.  Musculoskeletal: Normal range of motion. She exhibits no edema or deformity.  Neurological: She is alert and oriented to person, place, and time. She exhibits normal muscle tone.  Skin: Skin is warm and dry.  Scattered skin lesions, consistent with anticoagulation.  Psychiatric: She has a normal mood and affect. Her behavior is normal. Judgment and thought content normal.  Nursing note and vitals reviewed.    ED Treatments / Results  Labs (all labs ordered are listed, but only abnormal results are displayed) Labs Reviewed - No data to display  EKG  EKG Interpretation None       Radiology Dg Chest 2 View  Result Date: 05/08/2017 CLINICAL DATA:  Fever, cough today; lethargy x 2-3 days; hx PNA and PE; ex smoker; the artifact superficial to upper chest on lateral view is pt's necklace EXAM: CHEST  2 VIEW COMPARISON:  03/26/2017 FINDINGS: Midline trachea. Normal heart size. Atherosclerosis in the transverse aorta. Tiny (likely) left-sided pleural effusion is only apparent on the lateral view. No pneumothorax. No congestive failure. Diffuse peribronchial thickening. Probable atelectasis along the left heart border, new. IMPRESSION: Unilateral trace pleural fluid on the lateral view is favored to be left-sided.  Probable atelectasis developing along the left heart border. Peribronchial thickening which may relate to chronic bronchitis or smoking. Aortic Atherosclerosis (ICD10-I70.0). Electronically Signed   By: Abigail Miyamoto M.D.   On: 05/08/2017 13:30    Procedures Procedures (including critical care time)  Medications Ordered in ED Medications - No data to display   Initial Impression / Assessment and Plan / ED Course  I have reviewed the triage vital signs and the nursing notes.  Pertinent labs & imaging results that were available during my care of the patient were reviewed by me and considered in my medical decision making (see chart for details).      Patient Vitals for the past 24 hrs:  BP Temp Temp src Pulse Resp SpO2 Height Weight  05/08/17 1730 110/87 - - 93 20 98 % - -  05/08/17 1700 118/83 - - 92 18 97 % - -  05/08/17 1654 117/89 98 F (36.7 C) Oral 92 12 99 % - -  05/08/17 1244 106/84 97.9 F (36.6 C) Oral (!) 106 14 100 % 5' (1.524 m) 51.3 kg (113 lb)    5:33 PM Reevaluation with update and discussion. After initial assessment and treatment, an updated evaluation reveals findings discussed with patient all questions answered. Gustavo Meditz L      Final Clinical Impressions(s) / ED Diagnoses   Final diagnoses:  Acute bronchitis, unspecified organism    Cough with mucus, and mild bleeding.  Hemodynamically stable.  Currently taking Lovenox for DVT, due to stop in 4 days.  Doubt PE, pneumonia, metabolic instability or unstable liver disease.  Nursing Notes Reviewed/ Care Coordinated Applicable Imaging Reviewed Interpretation of Laboratory Data incorporated into ED treatment  The patient appears reasonably screened and/or stabilized for discharge and I doubt any other medical condition or other [facility] requiring further screening, evaluation, or treatment in the ED at this time prior to discharge.  Plan: Home Medications-continue current medications, use Robitussin-DM for  cough; Home Treatments-rest, usual fluid intake; return here if the recommended treatment, does not improve the symptoms; Recommended follow up-PCP checkup 1 week and as needed   New Prescriptions New Prescriptions   AMOXICILLIN-CLAVULANATE (AUGMENTIN) 875-125 MG TABLET    Take 1 tablet by mouth 2 (two) times daily. One po  bid x 7 days     Daleen Bo, MD 05/08/17 [phone]

## 2017-05-08 NOTE — ED Triage Notes (Addendum)
Patient reports cough with blood tinged sputum x24 hours. Reports rib pain with deep breathing. Denies N/V/D, chest pain and SOB.

## 2017-05-08 NOTE — ED Notes (Signed)
Bed: WA12 Expected date:  Expected time:  Means of arrival:  Comments: 

## 2017-05-09 ENCOUNTER — Other Ambulatory Visit (HOSPITAL_COMMUNITY): Payer: 59

## 2017-05-10 ENCOUNTER — Ambulatory Visit: Payer: Managed Care, Other (non HMO) | Admitting: Physical Therapy

## 2017-05-10 DIAGNOSIS — M6281 Muscle weakness (generalized): Secondary | ICD-10-CM

## 2017-05-10 DIAGNOSIS — R293 Abnormal posture: Secondary | ICD-10-CM

## 2017-05-10 DIAGNOSIS — R262 Difficulty in walking, not elsewhere classified: Secondary | ICD-10-CM | POA: Diagnosis not present

## 2017-05-10 NOTE — Therapy (Signed)
[facility] [address], [place], [place], [address] Phone: [phone]   Fax:  [phone]  Physical Therapy Treatment  Patient Details  Name: Mercedes Mitchell MRN: [id number] Date of Birth: [birth year] Referring Provider: [place], Betty G, MD  Encounter Date: 05/10/2017      PT End of Session - 05/10/17 0901    Visit Number 11   Date for PT Re-Evaluation 05/20/17   PT Start Time 0852  arrived late   PT Stop Time 0930   PT Time Calculation (min) 38 min   Activity Tolerance Patient tolerated treatment well      Past Medical History:  Diagnosis Date  . 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  paracentesis 2.6 liters 5/4: no SBP.    Marland Kitchen Alcoholism ([place]) 12/2016  . Candida esophagitis ([place])   . Cirrhosis ([place])   . DVT (deep venous thrombosis) ([place])   . Dysphagia 2011  . Esophagitis   . Hepatic steatosis   . Hiatal hernia   . Hiatal hernia   . Macrocytic anemia 12/2016  . Malnutrition ([place]) 12/2016  . Panic type anxiety neurosis 2011  . Portal hypertension ([place])   . Portal hypertensive gastropathy [facility])     Past Surgical History:  Procedure Laterality Date  . ESOPHAGOGASTRODUODENOSCOPY N/A 12/25/2016   Procedure: ESOPHAGOGASTRODUODENOSCOPY (EGD);  Surgeon: Jerene Bears, MD;  Location: [facility];  Service: Endoscopy;  Laterality: N/A;  . IR PARACENTESIS  02/02/2017  . IR TRANSCATHETER BX  02/02/2017  . IR US GUIDE VASC ACCESS RIGHT  02/02/2017  . IR VENOGRAM HEPATIC W HEMODYNAMIC EVALUATION  02/02/2017    There were no vitals filed for this visit.      Subjective Assessment - 05/10/17 0855    Subjective I'm a little sore.  I got a respiratory infection and didn't do a whole lot this weekend   Pertinent History TMPT   Limitations Walking   Patient Stated Goals continue getting stronger and work on upper body strength, be able to get up  off the floor   Currently in Pain? No/denies                         [facility] Adult PT Treatment/Exercise - 05/10/17 0001      Therapeutic Activites    ADL's stepping up, getting up and down off floor switching leading leg     Neuro Re-ed    Neuro Re-ed Details  balance on foam mat     Knee/Hip Exercises: Aerobic   Recumbent Bike L2 x 10 min     Knee/Hip Exercises: Standing   Forward Lunges Right;Left;10 reps   Forward Lunges Limitations 1/4 lunge stationary   Forward Step Up Right;Left;15 reps;Step Height: 6"   Other Standing Knee Exercises standing on foam mat - tandem, narrow BOS head turns, toe taps to second step     Shoulder Exercises: Standing   Extension Strengthening;Both;20 reps;Weights  15 lb   Row Strengthening;Both;15 reps;Weights  15 lb   Other Standing Exercises cervical retractions with foam roller behind back                  PT Short Term Goals - 05/03/17 1702      PT SHORT TERM GOAL #1   Title independent with initial HEP   Status Achieved     PT SHORT TERM GOAL #2  Title pt reports feeling 25% more steady when walking in the community   Status Achieved           PT Long Term Goals - 05/10/17 0931      PT LONG TERM GOAL #2   Title independent with advanced HEP   Time 8   Period Weeks   Status On-going     PT LONG TERM GOAL #4   Title [place] > or = to 49/56   Baseline  48/56 (improved from 43/56)   Time 8   Period Weeks   Status Partially Met               Plan - 05/10/17 0859    Clinical Impression Statement Patient continues to demonstrate progress with foam mat exercises.  She was able to perform all exercises correctly and without PT to assist any LOB during difficult balance exercises on foam mat.  She did need occasional UE support to correct LOB.  Pt will benefit from skilled PT to work on strength, posture, and balance to fully meet all long term goals   Rehab Potential Good   PT  Treatment/Interventions ADLs/Self Care Home Management;Cryotherapy;Electrical Stimulation;Moist Heat;Therapeutic activities;Therapeutic exercise;Balance training;Neuromuscular re-education;Gait training;Stair training;Manual techniques;Patient/family education;Taping;Dry needling;Passive range of motion;Functional mobility training   PT Next Visit Plan overall strength UE and LE, gait, stairs, balance   Consulted and Agree with Plan of Care Patient      Patient will benefit from skilled therapeutic intervention in order to improve the following deficits and impairments:  Abnormal gait, Difficulty walking, Decreased strength, Postural dysfunction, Decreased coordination, Decreased balance  Visit Diagnosis: Difficulty in walking, not elsewhere classified  Muscle weakness (generalized)  Abnormal posture     Problem List Patient Active Problem List   Diagnosis Date Noted  . Sepsis ([place]) 03/26/2017  . Community acquired pneumonia of right middle lobe of lung ([place])   . Sacral decubitus ulcer, stage III ([place]) 02/18/2017  . Abdominal pain 02/10/2017  . Acute deep vein thrombosis (DVT) of right femoral vein ([place]) 02/10/2017  . Hyponatremia 02/10/2017  . DVT (deep venous thrombosis) ([place]) 02/10/2017  . Alcoholic cirrhosis of liver with ascites ([place]) 01/12/2017  . GI bleeding 01/12/2017  . Current every day smoker 01/12/2017  . Ascites   . Coffee ground emesis   . Acute esophagitis   . Protein-calorie malnutrition, severe 12/24/2016  . Acute alcoholic hepatitis 52/77/8242  . ETOH abuse 12/23/2016  . Macrocytic anemia 12/21/2016  . 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 DYSPHAGIA 03/25/2010    Zannie Cove, PT 05/10/2017, 9:35 AM  [facility] [address], [place], [place], [address] Phone: [phone]   Fax:  [phone]  Name: Mercedes Mitchell MRN: [id number] Date of Birth: 03-23-1961

## 2017-05-11 ENCOUNTER — Other Ambulatory Visit (HOSPITAL_COMMUNITY): Payer: 59

## 2017-05-11 ENCOUNTER — Telehealth: Payer: Self-pay | Admitting: Internal Medicine

## 2017-05-11 ENCOUNTER — Ambulatory Visit (HOSPITAL_COMMUNITY): Payer: Self-pay | Admitting: Licensed Clinical Social Worker

## 2017-05-11 NOTE — Telephone Encounter (Signed)
No pancreatic head lesion. Slightly elevated Alk phos otherwise LFT and INR stable . Please inform patient the results. Thanks

## 2017-05-11 NOTE — Telephone Encounter (Signed)
Pyrtle pt calling for lab and CT resutls. As dod please advise.  IMPRESSION: 1. Stable cirrhotic changes involving the liver with associated portal venous hypertension, portal venous collaterals, esophageal varices and small volume ascites. No splenomegaly. No worrisome hepatic lesions. 2. No pancreatic head mass is identified. 3. Large amount of stool throughout the colon and down into the rectum may suggest constipation.

## 2017-05-11 NOTE — Telephone Encounter (Signed)
Spoke with pt and she is aware.

## 2017-05-12 ENCOUNTER — Other Ambulatory Visit (HOSPITAL_COMMUNITY): Payer: 59

## 2017-05-12 ENCOUNTER — Ambulatory Visit (INDEPENDENT_AMBULATORY_CARE_PROVIDER_SITE_OTHER): Payer: Managed Care, Other (non HMO) | Admitting: Family Medicine

## 2017-05-12 DIAGNOSIS — R195 Other fecal abnormalities: Secondary | ICD-10-CM

## 2017-05-12 DIAGNOSIS — R197 Diarrhea, unspecified: Secondary | ICD-10-CM

## 2017-05-12 LAB — CBC WITH DIFFERENTIAL/PLATELET
BASOS ABS: 68 {cells}/uL (ref 0–200)
Basophils Relative: 0.6 %
Eosinophils Absolute: 34 cells/uL (ref 15–500)
Eosinophils Relative: 0.3 %
HEMATOCRIT: 40.1 % (ref 35.0–45.0)
Hemoglobin: 13.8 g/dL (ref 11.7–15.5)
LYMPHS ABS: 1984 {cells}/uL (ref 850–3900)
MCH: 32.4 pg (ref 27.0–33.0)
MCHC: 34.4 g/dL (ref 32.0–36.0)
MCV: 94.1 fL (ref 80.0–100.0)
MPV: 9.9 fL (ref 7.5–12.5)
Monocytes Relative: 15.9 %
NEUTROS PCT: 65.8 %
Neutro Abs: 7501 cells/uL (ref 1500–7800)
Platelets: 290 10*3/uL (ref 140–400)
RBC: 4.26 10*6/uL (ref 3.80–5.10)
RDW: 12.6 % (ref 11.0–15.0)
Total Lymphocyte: 17.4 %
WBC: 11.4 10*3/uL — ABNORMAL HIGH (ref 3.8–10.8)
WBCMIX: 1813 {cells}/uL — AB (ref 200–950)

## 2017-05-12 LAB — BASIC METABOLIC PANEL
BUN: 10 (ref 4–21)
BUN: 10 mg/dL (ref 7–25)
CO2: 26 mmol/L (ref 20–32)
CREATININE: 0.91 mg/dL (ref 0.50–1.05)
Calcium: 9.4 mg/dL (ref 8.6–10.4)
Chloride: 96 mmol/L — ABNORMAL LOW (ref 98–110)
Creatinine: 0.9 (ref <=1.1)
Glucose, Bld: 93 mg/dL (ref 65–99)
POTASSIUM: 4.3 mmol/L (ref 3.5–5.3)
SODIUM: 130 mmol/L — AB (ref 135–146)
Sodium: 130 — AB (ref 137–147)

## 2017-05-12 LAB — CBC AND DIFFERENTIAL: WBC: 11.4

## 2017-05-12 MED ORDER — PROBIOTIC ACIDOPHILUS PO TABS: 1.0000 | ORAL_TABLET | Freq: Two times a day (BID) | ORAL | 0 refills | Status: DC

## 2017-05-12 NOTE — Progress Notes (Signed)
Subjective:    Patient ID: Mercedes Mitchell, female    DOB: Mar 20, 1961, 56 y.o.   MRN: [id number]  No chief complaint on file.   HPI Patient was seen today for ED f/u and acute concern.  Pt seen by ED for respiratory infection. Noted low-grade temp on Saturday night which continued Sunday morning. Patient took Tylenol but also noticed increased mucus production. Similar to symptoms 6 weeks ago when she had pneumonia. Patient was given a prescription for Augmentin. Denies fever, chills, cough, chest pain.  A few days after starting Augmentin patient noted severe diarrhea initially dark in color then turning black. Patient endorses 6 loose stools per day. Denies foul odor. Endorses weakness, no appetite, tenderness in abdomen.  Of note, patient on Lovenox for recent PE and right leg DVT. This was pt's first incidence of blood clots.  Final dose of Lovenox is today. Patient also has a history of liver problems including cirrhosis and hepatitis.  Past Medical History:  Diagnosis Date  . 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  paracentesis 2.6 liters 5/4: no SBP.    Marland Kitchen Alcoholism ([place]) 12/2016  . Candida esophagitis ([place])   . Cirrhosis ([place])   . DVT (deep venous thrombosis) ([place])   . Dysphagia 2011  . Esophagitis   . Hepatic steatosis   . Hiatal hernia   . Hiatal hernia   . Macrocytic anemia 12/2016  . Malnutrition ([place]) 12/2016  . Panic type anxiety neurosis 2011  . Portal hypertension ([place])   . Portal hypertensive gastropathy ([facility])     Allergies  Allergen Reactions  . Adhesive [Tape]     ROS General: Denies fever, chills, night sweats, changes in weight  +changes in appetite HEENT: Denies headaches, ear pain, changes in vision, rhinorrhea, sore throat CV: Denies CP, palpitations, SOB, orthopnea Pulm: Denies SOB, cough, wheezing  +recent respiratory infection. GI: Denies nausea, vomiting, constipation +dark  stools, diarrhea, abdominal pain GU: Denies dysuria, hematuria, frequency, vaginal discharge Msk: Denies muscle cramps, joint pains Neuro: Denies weakness, numbness, tingling Skin: Denies rashes, bruising Psych: Denies depression, anxiety, hallucinations     Objective:    There were no vitals taken for this visit.   Gen. Pleasant, well-nourished, in no distress, normal affect  HEENT: [place]/AT, face symmetric, faint scleral icterus, PERRLA, EOMI, nares patent without drainage Lungs: no accessory muscle use, CTAB, no wheezes or rales Cardiovascular: Tachycardic, heart sounds normal, no m/r/g, no peripheral edema Abdomen: soft, Mild TTP in right upper quadrant right lower quadrant and supra pubic area, Mild hepatomegaly, BS normal, minimal ascities. Neuro:  A&Ox3, CN II-XII intact, normal gait Skin:  Warm, Numerous areas of ecchymosis, faint icteric tint to skin. GI: anus without irritation,fissures, or hemorrhoids. No stool in rectal vault on digital exam.  Guaiac negative.   Wt Readings from Last 3 Encounters:  05/08/17 113 lb (51.3 kg)  05/02/17 113 lb (51.3 kg)  04/27/17 114 lb 4 oz (51.8 kg)    Diabetic Foot Exam - Simple   No data filed     Lab Results  Component Value Date   WBC 10.7 (H) 05/02/2017   HGB 14.1 05/02/2017   HCT 42.1 05/02/2017   PLT 346.0 05/02/2017   GLUCOSE 94 05/02/2017   CHOL 237 (H) 03/25/2010   TRIG 99.0 03/25/2010   HDL 69.70 03/25/2010   LDLDIRECT 146.3 03/25/2010   ALT 24 05/02/2017   AST 37 05/02/2017   NA  130 (L) 05/02/2017   K 5.0 05/02/2017   CL 92 (L) 05/02/2017   CREATININE 0.88 05/02/2017   BUN 10 05/02/2017   CO2 27 05/02/2017   TSH 1.446 02/19/2017   INR 1.3 (H) 05/02/2017    Assessment/Plan:  Dark stools -Possible GI bleed given h/o hepatitis, cirrhosis, and lovenox use. -Hemocult stool card negative in office. -Pt to complete stool card at home, given kit. Patient to return 1 stool card in a.m. to lab -will obain CBC   - Plan: CBC with Differential/Platelet -RTC precautions given.  Pt to return to clinic tomorrow if dizzy, lightheaded, unable to eat or drink, notices overt blood in stools, etc.  Diarrhea in adult patient -likely 2/2 to abx (Augementin) use. -Concern for dehydration  -Encouraged pt to increase po intake of water. -Will obtain stat BMP. -Given handout on foods to eat that ease diarrhea. -Given rx for probiotics - Plan: Basic metabolic panel, Lactobacillus (PROBIOTIC ACIDOPHILUS) TABS

## 2017-05-12 NOTE — Patient Instructions (Addendum)
Diarrhea, Adult Diarrhea is when you have loose and water poop (stool) often. Diarrhea can make you feel weak and cause you to get dehydrated. Dehydration can make you tired and thirsty, make you have a dry mouth, and make it so you pee (urinate) less often. Diarrhea often lasts 2-3 days. However, it can last longer if it is a sign of something more serious. It is important to treat your diarrhea as told by your doctor. Follow these instructions at home: Eating and drinking  Follow these recommendations as told by your doctor:  Take an oral rehydration solution (ORS). This is a drink that is sold at pharmacies and stores.  Drink clear fluids, such as: ? Water. ? Ice chips. ? Diluted fruit juice. ? Low-calorie sports drinks.  Eat bland, easy-to-digest foods in small amounts as you are able. These foods include: ? Bananas. ? Applesauce. ? Rice. ? Low-fat (lean) meats. ? Toast. ? Crackers.  Avoid drinking fluids that have a lot of sugar or caffeine in them.  Avoid alcohol.  Avoid spicy or fatty foods.  General instructions   Drink enough fluid to keep your pee (urine) clear or pale yellow.  Wash your hands often. If you cannot use soap and water, use hand sanitizer.  Make sure that all people in your home wash their hands well and often.  Take over-the-counter and prescription medicines only as told by your doctor.  Rest at home while you get better.  Watch your condition for any changes.  Take a warm bath to help with any burning or pain from having diarrhea.  Keep all follow-up visits as told by your doctor. This is important. Contact a doctor if:  You have a fever.  Your diarrhea gets worse.  You have new symptoms.  You cannot keep fluids down.  You feel light-headed or dizzy.  You have a headache.  You have muscle cramps. Get help right away if:  You have chest pain.  You feel very weak or you pass out (faint).  You have bloody or black poop or  poop that look like tar.  You have very bad pain, cramping, or bloating in your belly (abdomen).  You have trouble breathing or you are breathing very quickly.  Your heart is beating very quickly.  Your skin feels cold and clammy.  You feel confused.  You have signs of dehydration, such as: ? Dark pee, hardly any pee, or no pee. ? Cracked lips. ? Dry mouth. ? Sunken eyes. ? Sleepiness. ? Weakness. This information is not intended to replace advice given to you by your health care provider. Make sure you discuss any questions you have with your health care provider. Document Released: 01/26/2008 Document Revised: 02/27/2016 Document Reviewed: 04/15/2015 Elsevier Interactive Patient Education  2018 [place] Choices to Help Relieve Diarrhea, Adult When you have diarrhea, the foods you eat and your eating habits are very important. Choosing the right foods and drinks can help:  Relieve diarrhea.  Replace lost fluids and nutrients.  Prevent dehydration.  What general guidelines should I follow? Relieving diarrhea  Choose foods with less than 2 g or .07 oz. of fiber per serving.  Limit fats to less than 8 tsp (38 g or 1.34 oz.) a day.  Avoid the following: ? Foods and beverages sweetened with high-fructose corn syrup, honey, or sugar alcohols such as xylitol, sorbitol, and mannitol. ? Foods that contain a lot of fat or sugar. ? Fried, greasy, or spicy foods. ?  High-fiber grains, breads, and cereals. ? Raw fruits and vegetables.  Eat foods that are rich in probiotics. These foods include dairy products such as yogurt and fermented milk products. They help increase healthy bacteria in the stomach and intestines (gastrointestinal tract, or GI tract).  If you have lactose intolerance, avoid dairy products. These may make your diarrhea worse.  Take medicine to help stop diarrhea (antidiarrheal medicine) only as told by your health care provider. Replacing  nutrients  Eat small meals or snacks every 3-4 hours.  Eat bland foods, such as white rice, toast, or baked potato, until your diarrhea starts to get better. Gradually reintroduce nutrient-rich foods as tolerated or as told by your health care provider. This includes: ? Well-cooked protein foods. ? Peeled, seeded, and soft-cooked fruits and vegetables. ? Low-fat dairy products.  Take vitamin and mineral supplements as told by your health care provider. Preventing dehydration   Start by sipping water or a special solution to prevent dehydration (oral rehydration solution, ORS). Urine that is clear or pale yellow means that you are getting enough fluid.  Try to drink at least 8-10 cups of fluid each day to help replace lost fluids.  You may add other liquids in addition to water, such as clear juice or decaffeinated sports drinks, as tolerated or as told by your health care provider.  Avoid drinks with caffeine, such as coffee, tea, or soft drinks.  Avoid alcohol. What foods are recommended? The items listed may not be a complete list. Talk with your health care provider about what dietary choices are best for you. Grains White rice. White, [place], or pita breads (fresh or toasted), including plain rolls, buns, or bagels. White pasta. Saltine, soda, or graham crackers. Pretzels. Low-fiber cereal. Cooked cereals made with water (such as cornmeal, farina, or cream cereals). Plain muffins. Matzo. Melba toast. Zwieback. Vegetables Potatoes (without the skin). Most well-cooked and canned vegetables without skins or seeds. Tender lettuce. Fruits Apple sauce. Fruits canned in juice. Cooked apricots, cherries, grapefruit, peaches, pears, or plums. Fresh bananas and cantaloupe. Meats and other protein foods Baked or boiled chicken. Eggs. Tofu. Fish. Seafood. Smooth nut butters. Ground or well-cooked tender beef, ham, veal, lamb, pork, or poultry. Dairy Plain yogurt, kefir, and unsweetened  liquid yogurt. Lactose-free milk, buttermilk, skim milk, or soy milk. Low-fat or nonfat hard cheese. Beverages Water. Low-calorie sports drinks. Fruit juices without pulp. Strained tomato and vegetable juices. Decaffeinated teas. Sugar-free beverages not sweetened with sugar alcohols. Oral rehydration solutions, if approved by your health care provider. Seasoning and other foods Bouillon, broth, or soups made from recommended foods. What foods are not recommended? The items listed may not be a complete list. Talk with your health care provider about what dietary choices are best for you. Grains Whole grain, whole wheat, bran, or rye breads, rolls, pastas, and crackers. Wild or brown rice. Whole grain or bran cereals. Barley. Oats and oatmeal. Corn tortillas or taco shells. Granola. Popcorn. Vegetables Raw vegetables. Fried vegetables. Cabbage, broccoli, Brussels sprouts, artichokes, baked beans, beet greens, corn, kale, legumes, peas, sweet potatoes, and yams. Potato skins. Cooked spinach and cabbage. Fruits Dried fruit, including raisins and dates. Raw fruits. Stewed or dried prunes. Canned fruits with syrup. Meat and other protein foods Fried or fatty meats. Deli meats. Chunky nut butters. Nuts and seeds. Beans and lentils. Berniece Salines. Hot dogs. Sausage. Dairy High-fat cheeses. Whole milk, chocolate milk, and beverages made with milk, such as milk shakes. Half-and-half. Cream. sour cream. Ice cream. Beverages  Caffeinated beverages (such as coffee, tea, soda, or energy drinks). Alcoholic beverages. Fruit juices with pulp. Prune juice. Soft drinks sweetened with high-fructose corn syrup or sugar alcohols. High-calorie sports drinks. Fats and oils Butter. Cream sauces. Margarine. Salad oils. Plain salad dressings. Olives. Avocados. Mayonnaise. Sweets and desserts Sweet rolls, doughnuts, and sweet breads. Sugar-free desserts sweetened with sugar alcohols such as xylitol and sorbitol. Seasoning and  other foods Honey. Hot sauce. Chili powder. Gravy. Cream-based or milk-based soups. Pancakes and waffles. Summary  When you have diarrhea, the foods you eat and your eating habits are very important.  Make sure you get at least 8-10 cups of fluid each day, or enough to keep your urine clear or pale yellow.  Eat bland foods and gradually reintroduce healthy, nutrient-rich foods as tolerated, or as told by your health care provider.  Avoid high-fiber, fried, greasy, or spicy foods. This information is not intended to replace advice given to you by your health care provider. Make sure you discuss any questions you have with your health care provider. Document Released: 10/30/2003 Document Revised: 08/06/2016 Document Reviewed: 08/06/2016 Elsevier Interactive Patient Education  2017 [employer].

## 2017-05-13 ENCOUNTER — Ambulatory Visit: Payer: Managed Care, Other (non HMO) | Admitting: Physical Therapy

## 2017-05-13 ENCOUNTER — Encounter: Payer: Self-pay | Admitting: Physical Therapy

## 2017-05-13 DIAGNOSIS — R262 Difficulty in walking, not elsewhere classified: Secondary | ICD-10-CM

## 2017-05-13 DIAGNOSIS — R293 Abnormal posture: Secondary | ICD-10-CM

## 2017-05-13 DIAGNOSIS — M6281 Muscle weakness (generalized): Secondary | ICD-10-CM

## 2017-05-13 NOTE — Therapy (Signed)
[facility] [address] [place], [place], [address] Phone: [phone]   Fax:  [phone]  Physical Therapy Treatment  Patient Details  Name: Mercedes Mitchell MRN: [id number] Date of Birth: March 06, 1961 Referring Provider: Martinique, Betty G, MD  Encounter Date: 05/13/2017      PT End of Session - 05/13/17 0902    Visit Number 12   Date for PT Re-Evaluation 05/20/17   PT Start Time 5329  arrived 15 min late   PT Stop Time 0926   PT Time Calculation (min) 29 min   Activity Tolerance Patient tolerated treatment well      Past Medical History:  Diagnosis Date  . 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  paracentesis 2.6 liters 5/4: no SBP.    Marland Kitchen Alcoholism ([place]) 12/2016  . Candida esophagitis ([place])   . Cirrhosis ([place])   . DVT (deep venous thrombosis) ([place])   . Dysphagia 2011  . Esophagitis   . Hepatic steatosis   . Hiatal hernia   . Hiatal hernia   . Macrocytic anemia 12/2016  . Malnutrition ([place]) 12/2016  . Panic type anxiety neurosis 2011  . Portal hypertension ([place])   . Portal hypertensive gastropathy [facility])     Past Surgical History:  Procedure Laterality Date  . ESOPHAGOGASTRODUODENOSCOPY N/A 12/25/2016   Procedure: ESOPHAGOGASTRODUODENOSCOPY (EGD);  Surgeon: Jerene Bears, MD;  Location: [facility];  Service: Endoscopy;  Laterality: N/A;  . IR PARACENTESIS  02/02/2017  . IR TRANSCATHETER BX  02/02/2017  . IR US GUIDE VASC ACCESS RIGHT  02/02/2017  . IR VENOGRAM HEPATIC W HEMODYNAMIC EVALUATION  02/02/2017    There were no vitals filed for this visit.      Subjective Assessment - 05/13/17 0900    Subjective I am feeling alright, no soreness.  I feel like I already had a workout trying to get here on time.   Pertinent History TMPT   Limitations Walking   How long can you walk comfortably? 20 minutes   Patient Stated Goals continue  getting stronger and work on upper body strength, be able to get up off the floor   Currently in Pain? No/denies                         [facility] Adult PT Treatment/Exercise - 05/13/17 0001      Neuro Re-ed    Neuro Re-ed Details  balance and min UE support with all exercises     Knee/Hip Exercises: Aerobic   Recumbent Bike L2 x 8 min nustep  discussed HEP with PT; moist heat during     Knee/Hip Exercises: Standing   Heel Raises Both;20 reps  on edge of step min UE support   Forward Lunges Right;Left;10 reps   Forward Lunges Limitations 1/4 lunge stationary   Forward Step Up Right;Left;15 reps;Hand Hold: 1  BOSU   Other Standing Knee Exercises standing on foam mat - hip ext, abduction - 10 x each     Moist Heat Therapy   Number Minutes Moist Heat 8 Minutes  during nustep   Moist Heat Location Cervical                  PT Short Term Goals - 05/03/17 1702      PT SHORT TERM GOAL #1   Title independent with initial HEP   Status Achieved  PT SHORT TERM GOAL #2   Title pt reports feeling 25% more steady when walking in the community   Status Achieved           PT Long Term Goals - 05/10/17 0931      PT LONG TERM GOAL #2   Title independent with advanced HEP   Time 8   Period Weeks   Status On-going     PT LONG TERM GOAL #4   Title Berg > or = to 49/56   Baseline  48/56 (improved from 43/56)   Time 8   Period Weeks   Status Partially Met               Plan - 05/13/17 0904    Clinical Impression Statement Pt was able to perform lunges slightly lower today and only need minimal UE support throughout exercises.  Pt will most likely be ready to discharge after 2 more visits.  Will continue to progress exercises and finalize HEP   Rehab Potential Good   PT Treatment/Interventions ADLs/Self Care Home Management;Cryotherapy;Electrical Stimulation;Moist Heat;Therapeutic activities;Therapeutic exercise;Balance training;Neuromuscular  re-education;Gait training;Stair training;Manual techniques;Patient/family education;Taping;Dry needling;Passive range of motion;Functional mobility training   PT Next Visit Plan overall strength UE and LE, gait, stairs, balance; finalize HEP   Consulted and Agree with Plan of Care Patient      Patient will benefit from skilled therapeutic intervention in order to improve the following deficits and impairments:  Abnormal gait, Difficulty walking, Decreased strength, Postural dysfunction, Decreased coordination, Decreased balance  Visit Diagnosis: Difficulty in walking, not elsewhere classified  Muscle weakness (generalized)  Abnormal posture     Problem List Patient Active Problem List   Diagnosis Date Noted  . Sepsis ([place]) 03/26/2017  . Community acquired pneumonia of right middle lobe of lung ([place])   . Sacral decubitus ulcer, stage III ([place]) 02/18/2017  . Abdominal pain 02/10/2017  . Acute deep vein thrombosis (DVT) of right femoral vein ([place]) 02/10/2017  . Hyponatremia 02/10/2017  . DVT (deep venous thrombosis) ([place]) 02/10/2017  . Alcoholic cirrhosis of liver with ascites ([place]) 01/12/2017  . GI bleeding 01/12/2017  . Current every day smoker 01/12/2017  . Ascites   . Coffee ground emesis   . Acute esophagitis   . Protein-calorie malnutrition, severe 12/24/2016  . Acute alcoholic hepatitis 10/93/2355  . ETOH abuse 12/23/2016  . Macrocytic anemia 12/21/2016  . 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 DYSPHAGIA 03/25/2010    Zannie Cove, PT 05/13/2017, 9:27 AM  [facility] [address], [place], [place], [address] Phone: [phone]   Fax:  [phone]  Name: Shantera Monts MRN: [id number] Date of Birth: 1960-09-15

## 2017-05-16 ENCOUNTER — Ambulatory Visit: Payer: Managed Care, Other (non HMO) | Admitting: Physical Therapy

## 2017-05-16 ENCOUNTER — Encounter: Payer: Self-pay | Admitting: Physical Therapy

## 2017-05-16 ENCOUNTER — Encounter: Payer: Self-pay | Admitting: Family Medicine

## 2017-05-16 DIAGNOSIS — R262 Difficulty in walking, not elsewhere classified: Secondary | ICD-10-CM

## 2017-05-16 DIAGNOSIS — R293 Abnormal posture: Secondary | ICD-10-CM

## 2017-05-16 DIAGNOSIS — M6281 Muscle weakness (generalized): Secondary | ICD-10-CM

## 2017-05-16 NOTE — Therapy (Addendum)
[facility] [address] [place], [place], [address] Phone: [phone]   Fax:  [phone]  Physical Therapy Treatment  Patient Details  Name: Mercedes Mitchell MRN: [id number] Date of Birth: August 23, 1961 Referring Provider: Martin[place], Betty G, MD  Encounter Date: 05/16/2017      PT End of Session - 05/16/17 0854    Visit Number 13   Date for PT Re-Evaluation 05/20/17   PT Start Time 0852   PT Stop Time 0933   PT Time Calculation (min) 41 min   Activity Tolerance Patient tolerated treatment well      Past Medical History:  Diagnosis Date  . 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  paracentesis 2.6 liters 5/4: no SBP.    Marland Kitchen Alcoholism ([place]) 12/2016  . Candida esophagitis ([place])   . Cirrhosis ([place])   . DVT (deep venous thrombosis) ([place])   . Dysphagia 2011  . Esophagitis   . Hepatic steatosis   . Hiatal hernia   . Hiatal hernia   . Macrocytic anemia 12/2016  . Malnutrition ([place]) 12/2016  . Panic type anxiety neurosis 2011  . Portal hypertension ([place])   . Portal hypertensive gastropathy [facility])     Past Surgical History:  Procedure Laterality Date  . ESOPHAGOGASTRODUODENOSCOPY N/A 12/25/2016   Procedure: ESOPHAGOGASTRODUODENOSCOPY (EGD);  Surgeon: Jerene Bears, MD;  Location: [facility];  Service: Endoscopy;  Laterality: N/A;  . IR PARACENTESIS  02/02/2017  . IR TRANSCATHETER BX  02/02/2017  . IR US GUIDE VASC ACCESS RIGHT  02/02/2017  . IR VENOGRAM HEPATIC W HEMODYNAMIC EVALUATION  02/02/2017    There were no vitals filed for this visit.      Subjective Assessment - 05/16/17 0922    Subjective I am feeling good today.   Pertinent History TMPT   Limitations Walking   Patient Stated Goals continue getting stronger and work on upper body strength, be able to get up off the floor                         [facility] Adult PT  Treatment/Exercise - 05/16/17 0001      Therapeutic Activites    Therapeutic Activities ADL's   ADL's stepping, walking     Neuro Re-ed    Neuro Re-ed Details  BOSU, single leg, tandem on foam mat     Knee/Hip Exercises: Stretches   Active Hamstring Stretch Both;3 reps;20 seconds   Gastroc Stretch Right;Left;3 reps;20 seconds     Knee/Hip Exercises: Aerobic   Recumbent Bike L2 x 10 min nustep  PT monitored thought     Knee/Hip Exercises: Standing   Lateral Step Up 10 reps;Hand Hold: 2  BOSU   Forward Step Up Both;20 reps;Hand Hold: 0;Step Height: 6"   SLS on BOSU one UE min support   Walking with Sports Cord 15# walking back/forward 5x each   Other Standing Knee Exercises standing on foam mat tandem both ways     Shoulder Exercises: Standing   Extension Strengthening;Both;20 reps;Theraband  15 lb   Theraband Level (Shoulder Extension) Level 3 ([place])   Row Strengthening;Both;20 reps;Theraband  15 lb   Theraband Level (Shoulder Row) Level 3 Nyoka Cowden)                  PT Short Term Goals - 05/03/17 1702      PT SHORT TERM GOAL #1  Title independent with initial HEP   Status Achieved     PT SHORT TERM GOAL #2   Title pt reports feeling 25% more steady when walking in the community   Status Achieved           PT Long Term Goals - 05/16/17 5035      PT LONG TERM GOAL #1   Title able to get up and down from the floor easier   Time 8   Period Weeks   Status Achieved     PT LONG TERM GOAL #2   Title independent with advanced HEP   Time 8   Period Weeks   Status On-going     PT LONG TERM GOAL #3   Title TUG < or = to 12 sec   Baseline 10 sec   Time 8   Period Weeks   Status Achieved     PT LONG TERM GOAL #4   Title Berg > or = to 49/56   Baseline  48/56 (improved from 43/56)   Time 8   Period Weeks   Status Partially Met     PT LONG TERM GOAL #5   Title able to demonstrate greater variation in gait speed based on DGI, due to improved  stability    Baseline had noticeable variability of 3 different speeds   Time 8   Period Weeks   Status Achieved               Plan - 05/16/17 0925    Clinical Impression Statement Patient is doing well and able to perform balance exercises safely, she met most goals.  She will need to re-assess Berg next visit.  Patient has been able to get up and down from the floor.  She will discharge from PT next visit with HEP.   PT Treatment/Interventions ADLs/Self Care Home Management;Cryotherapy;Electrical Stimulation;Moist Heat;Therapeutic activities;Therapeutic exercise;Balance training;Neuromuscular re-education;Gait training;Stair training;Manual techniques;Patient/family education;Taping;Dry needling;Passive range of motion;Functional mobility training   PT Next Visit Plan re-assess Merrilee Jansky and review HEP, discharge next      Patient will benefit from skilled therapeutic intervention in order to improve the following deficits and impairments:  Abnormal gait, Difficulty walking, Decreased strength, Postural dysfunction, Decreased coordination, Decreased balance  Visit Diagnosis: Difficulty in walking, not elsewhere classified  Muscle weakness (generalized)  Abnormal posture     Problem List Patient Active Problem List   Diagnosis Date Noted  . Sepsis ([place]) 03/26/2017  . Community acquired pneumonia of right middle lobe of lung ([place])   . Sacral decubitus ulcer, stage III ([place]) 02/18/2017  . Abdominal pain 02/10/2017  . Acute deep vein thrombosis (DVT) of right femoral vein ([place]) 02/10/2017  . Hyponatremia 02/10/2017  . DVT (deep venous thrombosis) ([place]) 02/10/2017  . Alcoholic cirrhosis of liver with ascites ([place]) 01/12/2017  . GI bleeding 01/12/2017  . Current every day smoker 01/12/2017  . Ascites   . Coffee ground emesis   . Acute esophagitis   . Protein-calorie malnutrition, severe 12/24/2016  . Acute alcoholic hepatitis 46/56/8127  . ETOH abuse 12/23/2016  .  Macrocytic anemia 12/21/2016  . 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 DYSPHAGIA 03/25/2010    Zannie Cove, PT 05/16/2017, 9:34 AM  [facility] [address], [place], [place], [address] Phone: [phone]   Fax:  [phone]  Name: Harveen Flesch MRN: [id number] Date of Birth: Aug 30, 1960  PHYSICAL THERAPY DISCHARGE SUMMARY  Visits from Start of Care: 13  Current functional  level related to goals / functional outcomes: See above   Remaining deficits: See above   Education / Equipment: HEP  Plan: Patient agrees to discharge.  Patient goals were partially met. Patient is being discharged due to being pleased with the current functional level.  ?????         POC ending and doing well, had to cancel last appointment due to illness  Zannie Cove, PT 05/18/17 2:06 PM

## 2017-05-17 ENCOUNTER — Telehealth: Payer: Self-pay | Admitting: Internal Medicine

## 2017-05-17 NOTE — Telephone Encounter (Signed)
Pt wants to know when Dr. Hilarie Fredrickson wants her to have more labs drawn. Please advise.

## 2017-05-17 NOTE — Telephone Encounter (Signed)
6 weeks from last draw CBC, CMP and INR

## 2017-05-18 ENCOUNTER — Ambulatory Visit (INDEPENDENT_AMBULATORY_CARE_PROVIDER_SITE_OTHER): Payer: 59 | Admitting: Licensed Clinical Social Worker

## 2017-05-18 ENCOUNTER — Encounter: Payer: Self-pay | Admitting: Family Medicine

## 2017-05-18 ENCOUNTER — Ambulatory Visit (INDEPENDENT_AMBULATORY_CARE_PROVIDER_SITE_OTHER): Payer: Managed Care, Other (non HMO) | Admitting: Family Medicine

## 2017-05-18 ENCOUNTER — Ambulatory Visit: Payer: Managed Care, Other (non HMO) | Admitting: Physical Therapy

## 2017-05-18 ENCOUNTER — Ambulatory Visit (INDEPENDENT_AMBULATORY_CARE_PROVIDER_SITE_OTHER)
Admission: RE | Admit: 2017-05-18 | Discharge: 2017-05-18 | Disposition: A | Discharge status: Home / Self Care | Payer: Managed Care, Other (non HMO) | Source: Ambulatory Visit | Attending: Family Medicine | Admitting: Family Medicine

## 2017-05-18 ENCOUNTER — Other Ambulatory Visit: Payer: Self-pay

## 2017-05-18 VITALS — BP 100/60 | HR 90 | Temp 98.1°F | Ht 60.0 in | Wt 113.1 lb

## 2017-05-18 DIAGNOSIS — J069 Acute upper respiratory infection, unspecified: Secondary | ICD-10-CM | POA: Diagnosis not present

## 2017-05-18 DIAGNOSIS — B370 Candidal stomatitis: Secondary | ICD-10-CM

## 2017-05-18 DIAGNOSIS — F1210 Cannabis abuse, uncomplicated: Secondary | ICD-10-CM | POA: Diagnosis not present

## 2017-05-18 DIAGNOSIS — F1421 Cocaine dependence, in remission: Secondary | ICD-10-CM | POA: Diagnosis not present

## 2017-05-18 DIAGNOSIS — R05 Cough: Secondary | ICD-10-CM

## 2017-05-18 DIAGNOSIS — K7030 Alcoholic cirrhosis of liver without ascites: Secondary | ICD-10-CM

## 2017-05-18 DIAGNOSIS — R059 Cough, unspecified: Secondary | ICD-10-CM

## 2017-05-18 DIAGNOSIS — F1020 Alcohol dependence, uncomplicated: Secondary | ICD-10-CM | POA: Diagnosis not present

## 2017-05-18 LAB — CBC WITH DIFFERENTIAL/PLATELET
BASOS PCT: 0.4 % (ref 0.0–3.0)
Basophils Absolute: 0.1 10*3/uL (ref 0.0–0.1)
Eosinophils Absolute: 0.1 10*3/uL (ref 0.0–0.7)
Eosinophils Relative: 0.5 % (ref 0.0–5.0)
HCT: 42 % (ref 36.0–46.0)
Hemoglobin: 14 g/dL (ref 12.0–15.0)
LYMPHS ABS: 1.4 10*3/uL (ref 0.7–4.0)
Lymphocytes Relative: 12.8 % (ref 12.0–46.0)
MCHC: 33.3 g/dL (ref 30.0–36.0)
MCV: 97.9 fl (ref 78.0–100.0)
MONO ABS: 1.6 10*3/uL — AB (ref 0.1–1.0)
MONOS PCT: 14.2 % — AB (ref 3.0–12.0)
NEUTROS PCT: 72.1 % (ref 43.0–77.0)
Neutro Abs: 8.1 10*3/uL — ABNORMAL HIGH (ref 1.4–7.7)
Platelets: 320 10*3/uL (ref 150.0–400.0)
RBC: 4.29 Mil/uL (ref 3.87–5.11)
RDW: 13.9 % (ref 11.5–15.5)
WBC: 11.3 10*3/uL — ABNORMAL HIGH (ref 4.0–10.5)

## 2017-05-18 LAB — COMPREHENSIVE METABOLIC PANEL
ALBUMIN: 3.6 g/dL (ref 3.5–5.2)
ALK PHOS: 131 U/L — AB (ref 39–117)
ALT: 14 U/L (ref 0–35)
AST: 29 U/L (ref 0–37)
BUN: 11 mg/dL (ref 6–23)
CALCIUM: 9.7 mg/dL (ref 8.4–10.5)
CHLORIDE: 94 meq/L — AB (ref 96–112)
CO2: 26 mEq/L (ref 19–32)
Creatinine, Ser: 0.81 mg/dL (ref 0.40–1.20)
GFR: 78.02 mL/min (ref >60.00)
Glucose, Bld: 81 mg/dL (ref 70–99)
POTASSIUM: 4.7 meq/L (ref 3.5–5.1)
SODIUM: 130 meq/L — AB (ref 135–145)
Total Bilirubin: 1.7 mg/dL — ABNORMAL HIGH (ref 0.2–1.2)
Total Protein: 7.1 g/dL (ref 6.0–8.3)

## 2017-05-18 LAB — PROTIME-INR
INR: 1.5 ratio — AB (ref 0.8–1.0)
PROTHROMBIN TIME: 16.1 s — AB (ref 9.6–13.1)

## 2017-05-18 MED ORDER — NYSTATIN 100000 UNIT/ML MT SUSP: 5.0000 mL | Freq: Three times a day (TID) | OROMUCOSAL | 0 refills | Status: DC

## 2017-05-18 MED ORDER — AZITHROMYCIN 250 MG PO TABS: ORAL_TABLET | ORAL | 0 refills | Status: DC

## 2017-05-18 NOTE — Telephone Encounter (Signed)
Labs should be due around 06/13/17. Order labs in epic and reminder. Pt aware.

## 2017-05-18 NOTE — Progress Notes (Signed)
ACUTE VISIT   HPI:  Chief Complaint  Patient presents with  . Sore Throat  . low grade fever    Mercedes Mitchell is a 56 y.o. female, who is here today complaining of 2 days of low grade fever, max 99.8 F.  She is having sore throat and tongue burning sensation. She has also noted tongue erythema and decreased taste, denies edema. Exacerbated by eating citreous and spicy food.  She denies dysphagia. Non productive cough yesterday and today with small amount of clear sputum, denies hemoptysis.  She felt more fatigue, mild SOB yesterday when she was walking and talking on the phone at the same time. She has not noted wheezing or chest pain.  Sore Throat   This is a new problem. The current episode started in the past 7 days. The problem has been unchanged. The fever has been present for 1 to 2 days. The pain is mild. Associated symptoms include congestion, coughing, diarrhea and shortness of breath. Pertinent negatives include no abdominal pain, ear pain, headaches, hoarse voice, plugged ear sensation, neck pain, stridor, swollen glands, trouble swallowing or vomiting. She has tried acetaminophen for the symptoms. The treatment provided moderate relief.    No sick contact or recent travel.  She recently completed 10 days treatment with Augmentin, ER evaluation on 05/08/17 because cough with blood-tinged sputum. ER f/u on 05/12/17 when she was c/o worsening diarrhea with Augmenting. She decreased dose of Lactulose, having about 5 loose stools daily. She is still taking Spironolactone and Furosemide.    Lab Results  Component Value Date   [id number] 12/25/2016   She is planning on going to the beach tomorrow.   Review of Systems  Constitutional: Positive for appetite change and fatigue. Negative for chills.  HENT: Positive for congestion, postnasal drip, rhinorrhea and sore throat. Negative for ear pain, hoarse voice, mouth sores, nosebleeds, trouble swallowing  and voice change.   Respiratory: Positive for cough and shortness of breath. Negative for wheezing and stridor.   Cardiovascular: Negative for chest pain, palpitations and leg swelling.  Gastrointestinal: Positive for diarrhea. Negative for abdominal distention, abdominal pain, blood in stool, nausea and vomiting.  Genitourinary: Negative for decreased urine volume, dysuria and hematuria.  Musculoskeletal: Positive for gait problem. Negative for myalgias and neck pain.  Skin: Negative for rash.  Neurological: Negative for syncope and headaches.  Psychiatric/Behavioral: Negative for confusion. The patient is nervous/anxious.       Current Outpatient Prescriptions on File Prior to Visit  Medication Sig Dispense Refill  . B Complex-C-Folic Acid (SM B SUPER VITAMIN COMPLEX) TABS Take 1 tablet by mouth daily. 90 tablet 3  . feeding supplement, ENSURE ENLIVE, (ENSURE ENLIVE) LIQD Take 237 mLs by mouth 3 (three) times daily between meals. 90 Bottle 0  . folic acid (FOLVITE) 1 MG tablet Take 1 tablet (1 mg total) by mouth daily. 100 tablet 5  . furosemide (LASIX) 20 MG tablet Take 4 tablets (80 mg total) by mouth daily. (Patient taking differently: Take 60 mg by mouth daily. )    . lactulose (CHRONULAC) 10 GM/15ML solution Take 30 mLs (20 g total) by mouth 2 (two) times daily. (Patient taking differently: Take 30 g by mouth 3 (three) times daily. ) 1800 mL 1  . Multiple Vitamin (MULTIVITAMIN WITH MINERALS) TABS tablet Take 1 tablet by mouth daily. 30 tablet 0  . mupirocin cream (BACTROBAN) 2 % Apply 1 application topically 2 (two) times daily. 30 g 0  .  pantoprazole (PROTONIX) 40 MG tablet TAKE ONE TABLET BY MOUTH TWICE A DAY 60 tablet 2  . QUEtiapine (SEROQUEL) 25 MG tablet Take 1 tablet (25 mg total) by mouth at bedtime as needed (sleep). May take 2 tablets if needed 180 tablet 1  . spironolactone (ALDACTONE) 100 MG tablet Take 100 mg by mouth 2 (two) times daily.      No current  facility-administered medications on file prior to visit.      Past Medical History:  Diagnosis Date  . 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  paracentesis 2.6 liters 5/4: no SBP.    Marland Kitchen Alcoholism ([place]) 12/2016  . Candida esophagitis ([place])   . Cirrhosis ([place])   . DVT (deep venous thrombosis) ([place])   . Dysphagia 2011  . Esophagitis   . Hepatic steatosis   . Hiatal hernia   . Hiatal hernia   . Macrocytic anemia 12/2016  . Malnutrition ([place]) 12/2016  . Panic type anxiety neurosis 2011  . Portal hypertension ([place])   . Portal hypertensive gastropathy (HCC)    Allergies  Allergen Reactions  . Adhesive [Tape]     Social History   Social History  . Marital status: Married    Spouse name: N/A  . Number of children: N/A  . Years of education: N/A   Occupational History  . Unemployed    Social History Main Topics  . Smoking status: Former Smoker    Types: Cigarettes  . Smokeless tobacco: Never Used     Comment: Reports she quit "about 6 months ago"  . Alcohol use Yes     Comment: Last drink: 36 days ago   . Drug use: No     Comment: Last use Cocaine 11/17, reports last use "over a year", [facility] 2/18, reports "a year" ago  . Sexual activity: Not Asked   Other Topics Concern  . None   Social History Narrative   Married, children, not working    Vitals:   05/18/17 1102  BP: 100/60  Pulse: 90  Temp: 98.1 F (36.7 C)  SpO2: 97%   Body mass index is 22.09 kg/m.  Physical Exam  Nursing note and vitals reviewed. Constitutional: She is oriented to person, place, and time. She appears well-developed. She does not appear ill. No distress.  HENT:  Head: Normocephalic and atraumatic.  Right Ear: Tympanic membrane, external ear and ear canal normal.  Left Ear: Tympanic membrane, external ear and ear canal normal.  Nose: Rhinorrhea present. Right sinus exhibits no maxillary sinus tenderness and no  frontal sinus tenderness. Left sinus exhibits no maxillary sinus tenderness and no frontal sinus tenderness.  Mouth/Throat: Uvula is midline and mucous membranes are normal. No oropharyngeal exudate or posterior oropharyngeal edema.  Mild erythema on uvula and soft palate, no edema. Whitish areas scattered on tongue and soft palpate. Post nasal drainage.   Eyes: Pupils are equal, round, and reactive to light. Conjunctivae are normal.  Neck: No edema and no erythema present.  Cardiovascular: Normal rate and regular rhythm.   No murmur heard. Respiratory: Effort normal and breath sounds normal. No stridor. No respiratory distress. She has no wheezes. She has no rales.  Musculoskeletal: She exhibits no edema or tenderness.  Lymphadenopathy:       Head (right side): No submandibular adenopathy present.       Head (left side): No submandibular adenopathy present.    She has cervical adenopathy (1 cm,  no tender).       Right cervical: Posterior cervical adenopathy present.       Left cervical: Posterior cervical adenopathy present.  Neurological: She is alert and oriented to person, place, and time. She has normal strength.  Stable gait with no assistance.  Skin: Skin is warm. No rash noted. No erythema.  Psychiatric: She has a normal mood and affect. Her speech is normal.  Well groomed, good eye contact.    ASSESSMENT AND PLAN:  Mercedes Mitchell was seen today for sore throat and low grade fever.  Diagnoses and all orders for this visit:  Lab Results  Component Value Date   WBC 11.3 (H) 05/18/2017   HGB 14.0 05/18/2017   HCT 42.0 05/18/2017   MCV 97.9 05/18/2017   PLT 320.0 05/18/2017   Lab Results  Component Value Date   CREATININE 0.81 05/18/2017   BUN 11 05/18/2017   NA 130 (L) 05/18/2017   K 4.7 05/18/2017   CL 94 (L) 05/18/2017   CO2 26 05/18/2017    URI, acute  Symptoms suggests a viral etiology, she just completed Augmentin, I do not think abx is needed at this time.   Instructed to monitor for signs of complications, clearly instructed about warning signs. Instructed to take Tylenol if temp =>100 F. I also explained that cough and nasal congestion can last a few days and sometimes weeks. F/U as needed.   Cough  Today lung auscultation I do not appreciate abnormal sounds. Because she reports having some SOB yesterday CXR was ordered today. Given her immunosuppress state, I gave ehr a Rx for Azithromycin to start in 4 days oif symptoms persist or before if symptoms get worse. Clearly instructed about warning signs.  -     DG Chest 2 View; Future  Oral thrush  This could explain tongue symptoms and in part sore throat. Nystatin sl recommended. Instructed about warning signs.  -     nystatin (MYCOSTATIN) 100000 UNIT/ML suspension; Take 5 mLs (500,000 Units total) by mouth 3 (three) times daily.  Alcoholic cirrhosis, unspecified whether ascites present [facility])  She denies abdominal pain. She decreased Lactulose because worsening diarrhea, seems improving.Goal is to have 3-4 soft stools daily. No changes in Furosemide and Spironolactone. She mentions that her Na++ was low on recent labs, it has been stable at 130. Some side effects of diuretics discussed.  She is taking Acetaminophen to treat acute symptoms, recommend max dose of 2000 mg daily.  -     CBC w/Diff -     Comp Met (CMET) -     INR/PT  Other orders -     azithromycin (ZITHROMAX) 250 MG tablet; Take if still fever in 4 days.    -MercedesBessy Reaney was advised to seek immediate medical attention if sudden worsening symptoms or to follow if they persist or if new concerns arise.       Ayari Liwanag G. Martinique, MD  [facility]. [place] office.

## 2017-05-18 NOTE — Patient Instructions (Signed)
A few things to remember from today's visit:   Cough - Plan: DG Chest 2 View  Oral thrush  ? Viral illness.  Monitor for new symptoms.   A viral illness can cause fever that last 4-7 days in average.  Antibiotics also have side effects that go from gastrointestinal symptoms (diarrhea,nausea,bloating sensation) to bacterial resistance , and even serious illness as C. Diff  Infections.  If you are not feeling ANY better in 4-5 days you can call me and we may consider antibiotic treatment ,even thought viral illness can take longer to resolve.     Please be sure medication list is accurate. If a new problem present, please set up appointment sooner than planned today.

## 2017-05-19 ENCOUNTER — Encounter: Payer: Self-pay | Admitting: Physical Therapy

## 2017-05-20 ENCOUNTER — Encounter (HOSPITAL_COMMUNITY): Payer: Self-pay | Admitting: Licensed Clinical Social Worker

## 2017-05-20 NOTE — Progress Notes (Signed)
  Weekly Group Progress Note  Program: OUTPATIENT SKILLS GROUP  Group Time: 5:30-6:30pm  Participation Level: Active  Behavioral Response: Appropriate and Sharing  Type of Therapy:  Psycho-education Group  Skills discussed: Pros and Cons list making   Summary of Progress: Pt was active and engaged in group discussion. 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 Pt asked for help w/ coping skills to avoid buying marijuana in the coming days as she preps for trip. Pt was honest and open in her desire to smoke marijuana at the beach, but admitted she was also looking forward to "Being mindful in nature".   Summary of Group: Patients were active and engaged in process and psychoeducational session. Counselor led brief check-ins from pts to identify their current mood on a 1-10 scale, and to identify their goals for session today. Pts shared about their past week and commitments they had made, struggled w/, or changed due to circumstances. Two new pts were present and shared openly about their time since graduating from [employer] in the last 2 weeks. One pt became very emotional and discussed her fears since her panic attacks were "starting to return". The group offered helpful and supportive feedback.    Archie Balboa, LPCA, LCASA

## 2017-05-24 ENCOUNTER — Encounter: Payer: Self-pay | Admitting: Family Medicine

## 2017-05-24 ENCOUNTER — Ambulatory Visit (INDEPENDENT_AMBULATORY_CARE_PROVIDER_SITE_OTHER): Payer: Managed Care, Other (non HMO) | Admitting: Family Medicine

## 2017-05-24 ENCOUNTER — Ambulatory Visit (HOSPITAL_COMMUNITY)
Admission: RE | Admit: 2017-05-24 | Discharge: 2017-05-24 | Disposition: A | Discharge status: Home / Self Care | Payer: Managed Care, Other (non HMO) | Source: Ambulatory Visit | Attending: Cardiovascular Disease | Admitting: Cardiovascular Disease

## 2017-05-24 VITALS — BP 94/70 | HR 100 | Resp 12 | Ht 60.0 in | Wt 114.4 lb

## 2017-05-24 DIAGNOSIS — M25472 Effusion, left ankle: Secondary | ICD-10-CM

## 2017-05-24 DIAGNOSIS — M25552 Pain in left hip: Secondary | ICD-10-CM

## 2017-05-24 DIAGNOSIS — I8393 Asymptomatic varicose veins of bilateral lower extremities: Secondary | ICD-10-CM | POA: Diagnosis not present

## 2017-05-24 DIAGNOSIS — R202 Paresthesia of skin: Secondary | ICD-10-CM

## 2017-05-24 DIAGNOSIS — M25551 Pain in right hip: Secondary | ICD-10-CM

## 2017-05-24 NOTE — Patient Instructions (Signed)
A few things to remember from today's visit:   Edema of left ankle  Hip pain, bilateral  Varicose veins of both lower extremities, unspecified whether complicated  ABC exercises. Shoe wear with ankle support. Local ice.   Please be sure medication list is accurate. If a new problem present, please set up appointment sooner than planned today.         Varicose Veins Varicose veins are veins that have become enlarged and twisted. They are usually seen in the legs but can occur in other parts of the body as well. What are the causes? This condition is the result of valves in the veins not working properly. Valves in the veins help to return blood from the leg to the heart. If these valves are damaged, blood flows backward and backs up into the veins in the leg near the skin. This causes the veins to become larger. What increases the risk? People who are on their feet a lot, who are pregnant, or who are overweight are more likely to develop varicose veins. What are the signs or symptoms?  Bulging, twisted-appearing, bluish veins, most commonly found on the legs.  Leg pain or a feeling of heaviness. These symptoms may be worse at the end of the day.  Leg swelling.  Changes in skin color. How is this diagnosed? A health care provider can usually diagnose varicose veins by examining your legs. Your health care provider may also recommend an ultrasound of your leg veins. How is this treated? Most varicose veins can be treated at home.However, other treatments are available for people who have persistent symptoms or want to improve the cosmetic appearance of the varicose veins. These treatment options include:  Sclerotherapy. A solution is injected into the vein to close it off.  Laser treatment. A laser is used to heat the vein to close it off.  Radiofrequency vein ablation. An electrical current produced by radio waves is used to close off the vein.  Phlebectomy. The vein  is surgically removed through small incisions made over the varicose vein.  Vein ligation and stripping. The vein is surgically removed through incisions made over the varicose vein after the vein has been tied (ligated).  Follow these instructions at home:  Do not stand or sit in one position for long periods of time. Do not sit with your legs crossed. Rest with your legs raised during the day.  Wear compression stockings as directed by your health care provider. These stockings help to prevent blood clots and reduce swelling in your legs.  Do not wear other tight, encircling garments around your legs, pelvis, or waist.  Walk as much as possible to increase blood flow.  Raise the foot of your bed at night with 2-inch blocks.  If you get a cut in the skin over the vein and the vein bleeds, lie down with your leg raised and press on it with a clean cloth until the bleeding stops. Then place a bandage (dressing) on the cut. See your health care provider if it continues to bleed. Contact a health care provider if:  The skin around your ankle starts to break down.  You have pain, redness, tenderness, or hard swelling in your leg over a vein.  You are uncomfortable because of leg pain. This information is not intended to replace advice given to you by your health care provider. Make sure you discuss any questions you have with your health care provider. Document Released: 05/19/2005 Document Revised: 01/15/2016  Document Reviewed: 02/10/2016 Elsevier Interactive Patient Education  2017 [employer].

## 2017-05-24 NOTE — Progress Notes (Signed)
ACUTE VISIT   HPI:  Chief Complaint  Patient presents with  . left ankle swollen    x 2 days    [name] is a 56 y.o. female, who is here today complaining of left ankle edema and pain. She noted medial ankle edema about 2 days ago, constant pain, worse when she first gets up in the morning and alleviated as she moves during the day. Pain radiates to her Achilles tendon, she is concerned about possible Achilles tendon rupture.  She is also concerned about another DVT.  Pain is sharp, exacerbated by movement or by bearing weight, Max 8/10. She has not noted fever, chills.  She is also complaining about bilateral hip achy pain for the past 2-3 days. She denies any fall, edema, or erythema on area. Pain is exacerbated by getting up from chair and alleviated by rest.  She was at the beach recently, she walked some "but did not overdo it."  I saw her recently, 05/18/2017, at that time she was complaining of cough and URI symptoms. She is reporting improvement of symptoms, she hasn't had any fever and didn't feel like she needed to take antibiotics. She still has some rhinorrhea and postnasal drainage.  She is also concerned about skin "coloration changes" and occasional tingling sensation on lower extremities. These symptom had been going on for a while but she has been noticing more lately. She denies associated lower extremity edema. She has not identified exacerbating or alleviating factors. Hx of alcoholic hepatitis.  Balance have improved with PT.   Lab Results  Component Value Date   TSH 1.446 02/19/2017   Lab Results  Component Value Date   [id number] 387 12/25/2016   Lab Results  Component Value Date   CREATININE 0.81 05/18/2017   BUN 11 05/18/2017   NA 130 (L) 05/18/2017   K 4.7 05/18/2017   CL 94 (L) 05/18/2017   CO2 26 05/18/2017     Review of Systems  Constitutional: Positive for fatigue (No more than usual). Negative for activity  change, appetite change, chills, diaphoresis and fever.  HENT: Positive for postnasal drip and rhinorrhea. Negative for mouth sores, nosebleeds, sore throat and trouble swallowing.   Eyes: Negative for redness and visual disturbance.  Respiratory: Negative for cough, shortness of breath and wheezing.   Cardiovascular: Negative for chest pain, palpitations and leg swelling.  Gastrointestinal: Negative for abdominal pain, nausea and vomiting.       Negative for changes in bowel habits.  Genitourinary: Negative for decreased urine volume, dysuria and hematuria.  Musculoskeletal: Positive for arthralgias, gait problem and joint swelling.  Skin: Negative for rash and wound.  Allergic/Immunologic: Negative for environmental allergies.  Neurological: Negative for syncope, weakness and headaches.  Psychiatric/Behavioral: Negative for confusion. The patient is nervous/anxious.       Current Outpatient Prescriptions on File Prior to Visit  Medication Sig Dispense Refill  . B Complex-C-Folic Acid (SM B SUPER VITAMIN COMPLEX) TABS Take 1 tablet by mouth daily. 90 tablet 3  . feeding supplement, ENSURE ENLIVE, (ENSURE ENLIVE) LIQD Take 237 mLs by mouth 3 (three) times daily between meals. 90 Bottle 0  . folic acid (FOLVITE) 1 MG tablet Take 1 tablet (1 mg total) by mouth daily. 100 tablet 5  . furosemide (LASIX) 20 MG tablet Take 4 tablets (80 mg total) by mouth daily. (Patient taking differently: Take 60 mg by mouth daily. )    . lactulose (CHRONULAC) 10 GM/15ML solution Take  30 mLs (20 g total) by mouth 2 (two) times daily. (Patient taking differently: Take 30 g by mouth 3 (three) times daily. ) 1800 mL 1  . Multiple Vitamin (MULTIVITAMIN WITH MINERALS) TABS tablet Take 1 tablet by mouth daily. 30 tablet 0  . mupirocin cream (BACTROBAN) 2 % Apply 1 application topically 2 (two) times daily. 30 g 0  . nystatin (MYCOSTATIN) 100000 UNIT/ML suspension Take 5 mLs (500,000 Units total) by mouth 3 (three)  times daily. 473 mL 0  . pantoprazole (PROTONIX) 40 MG tablet TAKE ONE TABLET BY MOUTH TWICE A DAY 60 tablet 2  . QUEtiapine (SEROQUEL) 25 MG tablet Take 1 tablet (25 mg total) by mouth at bedtime as needed (sleep). May take 2 tablets if needed 180 tablet 1  . spironolactone (ALDACTONE) 100 MG tablet Take 100 mg by mouth 2 (two) times daily.      No current facility-administered medications on file prior to visit.      Past Medical History:  Diagnosis Date  . 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  paracentesis 2.6 liters 5/4: no SBP.    Marland Kitchen Alcoholism ([place]) 12/2016  . Candida esophagitis ([place])   . Cirrhosis ([place])   . DVT (deep venous thrombosis) ([place])   . Dysphagia 2011  . Esophagitis   . Hepatic steatosis   . Hiatal hernia   . Hiatal hernia   . Macrocytic anemia 12/2016  . Malnutrition ([place]) 12/2016  . Panic type anxiety neurosis 2011  . Portal hypertension ([place])   . Portal hypertensive gastropathy ([facility])    Allergies  Allergen Reactions  . Adhesive [Tape]     Social History   Social History  . Marital status: Married    Spouse name: N/A  . Number of children: N/A  . Years of education: N/A   Occupational History  . Unemployed    Social History Main Topics  . Smoking status: Former Smoker    Types: Cigarettes  . Smokeless tobacco: Never Used     Comment: Reports she quit "about 6 months ago"  . Alcohol use Yes     Comment: Last drink: 36 days ago   . Drug use: No     Comment: Last use Cocaine 11/17, reports last use "over a year", [facility] 2/18, reports "a year" ago  . Sexual activity: Not Asked   Other Topics Concern  . None   Social History Narrative   Married, children, not working    Vitals:   05/24/17 0945  BP: 94/70  Pulse: 100  Resp: 12  SpO2: 97%   Body mass index is 22.34 kg/m.  Physical Exam  Nursing note and vitals reviewed. Constitutional: She is oriented to person,  place, and time. She appears well-developed. No distress.  HENT:  Head: Normocephalic and atraumatic.  Mouth/Throat: Oropharynx is clear and moist and mucous membranes are normal.  Eyes: Pupils are equal, round, and reactive to light. Conjunctivae are normal.  Cardiovascular: Normal rate and regular rhythm.   No murmur heard. Pulses:      Dorsalis pedis pulses are 2+ on the right side, and 2+ on the left side.  Varicose veins on lower extremitie, bilateral. Holman's sign negative.  Respiratory: Effort normal and breath sounds normal. No respiratory distress.  GI: Soft. She exhibits no mass. There is no tenderness.  Musculoskeletal: She exhibits no edema.       Right ankle: Achilles tendon exhibits normal  Thompson's test results.       Left ankle: She exhibits swelling. She exhibits normal range of motion and no ecchymosis. Tenderness. Medial malleolus and posterior TFL tenderness found. No head of 5th metatarsal and no proximal fibula tenderness found. Achilles tendon exhibits no pain, no defect and normal Thompson's test results.  Hip mild pain with ROM, R>L, mainly with rotation. No leg length discrepancy appreciated.    Lymphadenopathy:    She has no cervical adenopathy.  Neurological: She is alert and oriented to person, place, and time. She has normal strength.  No focal deficit appreciated. Stable gait without assistance.  Skin: Skin is warm. Ecchymosis noted. No abrasion and no rash noted. No erythema.  A few scattered ecchymosis on LE and UE but in general improved compared with prior visits. Minimal post inflammatory pigmentation changes. No erythematous lesion appreciated.  Psychiatric: Her mood appears anxious.  Fairly groomed, good eye contact.    ASSESSMENT AND PLAN:   Ms. Mercedes Mitchell was seen today for left ankle swollen.  Diagnoses and all orders for this visit:  Edema of left ankle  With associated pain on medial aspect.  We dicussed possible etiologies, ?  Sprain. I explained that the likelihood of this being another DVT is very low, she is very concerned about this possibility, she would like to be ruled out.  Lower extremity US will be arranged. I do not think ankle X ray is needed at this time.  For now I recommended local ice, shoewear with ankle support, and ABC PT exercises. She was clearly instructed about warning signs.  -     VAS [place] LOWER EXTREMITY VENOUS (DVT); Future  Hip pain, bilateral  No Hx of trauma,so I do not think imagine is needed today. ? OA. Continue monitoring and f/u as needed.  Varicose veins of both lower extremities, unspecified whether complicated  Educated about Dx. She does not have associated edema. She may benefits for compression stockings.  Tingling sensation  Chronic. Could be related to varicose vein disease. Other possible causes discussed, including neuropathy related to alcohol. Fall precautions discussed as well as skin care.  For now I think we can continue monitoring. Instructed about warning signs.    -[name] was advised to seek immediate medical attention if sudden worsening symptoms or to follow if they persist or if new concerns arise.       Casper Pagliuca G. [place], MD  [facility]. [place] office.

## 2017-05-25 ENCOUNTER — Ambulatory Visit (INDEPENDENT_AMBULATORY_CARE_PROVIDER_SITE_OTHER): Payer: 59 | Admitting: Licensed Clinical Social Worker

## 2017-05-25 DIAGNOSIS — F1210 Cannabis abuse, uncomplicated: Secondary | ICD-10-CM

## 2017-05-25 DIAGNOSIS — F1020 Alcohol dependence, uncomplicated: Secondary | ICD-10-CM

## 2017-05-25 DIAGNOSIS — F1421 Cocaine dependence, in remission: Secondary | ICD-10-CM

## 2017-05-27 ENCOUNTER — Encounter (HOSPITAL_COMMUNITY): Payer: Self-pay | Admitting: Licensed Clinical Social Worker

## 2017-05-27 NOTE — Progress Notes (Signed)
  Weekly Group Progress Note  Program: OUTPATIENT SKILLS GROUP  Group Time: 5:30-6:30pm  Participation Level: Active  Behavioral Response: Appropriate and Sharing  Type of Therapy:  Psycho-education Group  Skills discussed: Self Care   Summary of Progress: PT was active and engaged in group. She shared about a successful trip to the beach in which she was able to resist triggers and not allow thoughts of using to lead her to actually use alcohol or marijuana. Pt was upbeat and open in her sharing. She offered helpful and supportive feedback for group member who was struggling w/ panic disorder symptoms.  Summary of Group: Pts were active and engaged in session and discussed challenges w/ anxiety, depression, mood disturbance, and addiction since last session. Pts shared openly w/ each other. Counselor led psychoeducation time dedicated to "the art of self care" and passed out a handout on 15 self care activities that pts were instructed to try before next session.    Archie Balboa, LPCA, LCASA

## 2017-06-01 ENCOUNTER — Ambulatory Visit (INDEPENDENT_AMBULATORY_CARE_PROVIDER_SITE_OTHER): Payer: 59 | Admitting: Licensed Clinical Social Worker

## 2017-06-01 DIAGNOSIS — F1020 Alcohol dependence, uncomplicated: Secondary | ICD-10-CM | POA: Diagnosis not present

## 2017-06-02 NOTE — Progress Notes (Signed)
  Weekly Group Progress Note  Program: OUTPATIENT SKILLS GROUP  Group Time: 5:30-6:30pm  Participation Level: Active  Behavioral Response: Appropriate and Sharing  Type of Therapy:  Psycho-education Group  Skills discussed: Mindfulness, [place], coloring   Summary of Progress: Pt was open and shared that she remains happy and well-adjusted since graduating from [employer]. She stated the exercise was helpful and helped her relax. She states she wants to work on doing yoga for relaxation.  Summary of Group: Pts were active and engaged in psychoeducation and skill building group that focused on concept of relaxation and mindfulness through adult coloring. Pts were guided through a 15 min exercise to increase awareness and decrease stress.    Archie Balboa, LPCA, LCASA

## 2017-06-08 ENCOUNTER — Ambulatory Visit (INDEPENDENT_AMBULATORY_CARE_PROVIDER_SITE_OTHER): Payer: 59 | Admitting: Licensed Clinical Social Worker

## 2017-06-08 DIAGNOSIS — F1020 Alcohol dependence, uncomplicated: Secondary | ICD-10-CM

## 2017-06-09 ENCOUNTER — Telehealth (HOSPITAL_COMMUNITY): Payer: Self-pay | Admitting: Medical

## 2017-06-09 ENCOUNTER — Other Ambulatory Visit: Payer: Self-pay | Admitting: *Deleted

## 2017-06-09 MED ORDER — PANTOPRAZOLE SODIUM 40 MG PO TBEC: 40.0000 mg | DELAYED_RELEASE_TABLET | Freq: Two times a day (BID) | ORAL | 0 refills | Status: DC

## 2017-06-09 NOTE — Telephone Encounter (Signed)
Received fax:   Pt needs refill on quetiapine fumarate tabs 25mg  90 day supply  And folic acid tabs 1mg  90 day supply  [facility]

## 2017-06-09 NOTE — Telephone Encounter (Signed)
Pt needs appt-contact Brandon Melnick at [facility] and ask her to check with Wes about who was to manage her meds after discharge Ask one of them to call me Thanks

## 2017-06-10 ENCOUNTER — Encounter (HOSPITAL_COMMUNITY): Payer: Self-pay | Admitting: Licensed Clinical Social Worker
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
## 2017-06-15 ENCOUNTER — Ambulatory Visit (INDEPENDENT_AMBULATORY_CARE_PROVIDER_SITE_OTHER): Payer: 59 | Admitting: Licensed Clinical Social Worker

## 2017-06-15 DIAGNOSIS — F1020 Alcohol dependence, uncomplicated: Secondary | ICD-10-CM | POA: Diagnosis not present

## 2017-06-17 ENCOUNTER — Encounter (HOSPITAL_COMMUNITY): Payer: Self-pay | Admitting: Licensed Clinical Social Worker

## 2017-06-17 NOTE — Progress Notes (Signed)
  Weekly Group Progress Note  Program: OUTPATIENT SKILLS GROUP  Group Time: 5:30-6:30pm  Participation Level: Active  Behavioral Response: Appropriate and Sharing  Type of Therapy:  Psycho-education Group  Skills discussed: Communication, Mindfulness, assertiveness   Summary of Progress: Pt continues to make strong progress in terms of her sobriety. She reports she is going to the [facility] more frequently. She reports she is a 10 on a scale of 1-10 w/ 10 being great.  Summary of Group: Counselor led 1 hour psychoeducation group based around a brief check in, discussion of individual goals, and increasing assertive communication". Counselor began group w/ 5 min mindfulness meditation to build focus and increase awareness.   Archie Balboa, LPCA, LCASA

## 2017-06-20 ENCOUNTER — Other Ambulatory Visit: Payer: Self-pay

## 2017-06-20 ENCOUNTER — Telehealth: Payer: Self-pay | Admitting: Internal Medicine

## 2017-06-20 NOTE — Telephone Encounter (Signed)
Pt scheduled to see Dr. Hilarie Fredrickson 08/29/17@10am . Appt letter mailed to pt.

## 2017-06-21 ENCOUNTER — Ambulatory Visit (INDEPENDENT_AMBULATORY_CARE_PROVIDER_SITE_OTHER): Payer: 59 | Admitting: Licensed Clinical Social Worker

## 2017-06-21 DIAGNOSIS — F1421 Cocaine dependence, in remission: Secondary | ICD-10-CM

## 2017-06-21 DIAGNOSIS — F1020 Alcohol dependence, uncomplicated: Secondary | ICD-10-CM | POA: Diagnosis not present

## 2017-06-22 ENCOUNTER — Ambulatory Visit (HOSPITAL_COMMUNITY): Payer: Self-pay | Admitting: Licensed Clinical Social Worker

## 2017-06-23 ENCOUNTER — Encounter (HOSPITAL_COMMUNITY): Payer: Self-pay | Admitting: Licensed Clinical Social Worker

## 2017-06-23 NOTE — Progress Notes (Signed)
  Weekly Group Progress Note  Program: OUTPATIENT SKILLS GROUP  Group Time: 5:30-6:30pm  Participation Level: Active  Behavioral Response: Appropriate, Sharing and Agitated  Type of Therapy:  Psycho-education Group  Skills discussed: Relapse Prevention   Summary of Progress: Pt continues to make strong progress w/ sustaining her sobriety. She continues to admit she is "not going to enough meetings". Pt reported on her recent dreams of using and admitted she "occasionally dreams about being able to take one drink or hit of marijuana". Pt discussed stressors w/ her adult children.  Summary of Group: Pts were active and engage in group skill-building session. Counselor led brief check-in and encouraged pts to identify their "needs for tonight" and how the group could help them gain insight or relieve symptoms. Counselor led brief psychoeducational session on relapse prevention and sticking to routine.    Archie Balboa, LPCA, LCASA

## 2017-06-24 ENCOUNTER — Other Ambulatory Visit (INDEPENDENT_AMBULATORY_CARE_PROVIDER_SITE_OTHER): Payer: Managed Care, Other (non HMO)

## 2017-06-24 DIAGNOSIS — K7030 Alcoholic cirrhosis of liver without ascites: Secondary | ICD-10-CM

## 2017-06-24 LAB — CBC WITH DIFFERENTIAL/PLATELET
BASOS ABS: 0 10*3/uL (ref 0.0–0.1)
Basophils Relative: 0.6 % (ref 0.0–3.0)
EOS ABS: 0.1 10*3/uL (ref 0.0–0.7)
Eosinophils Relative: 2.1 % (ref 0.0–5.0)
HEMATOCRIT: 42.1 % (ref 36.0–46.0)
Hemoglobin: 14.3 g/dL (ref 12.0–15.0)
Lymphocytes Relative: 33.2 % (ref 12.0–46.0)
Lymphs Abs: 2.3 10*3/uL (ref 0.7–4.0)
MCHC: 34 g/dL (ref 30.0–36.0)
MCV: 93.3 fl (ref 78.0–100.0)
MONOS PCT: 11.7 % (ref 3.0–12.0)
Monocytes Absolute: 0.8 10*3/uL (ref 0.1–1.0)
NEUTROS ABS: 3.6 10*3/uL (ref 1.4–7.7)
NEUTROS PCT: 52.4 % (ref 43.0–77.0)
PLATELETS: 269 10*3/uL (ref 150.0–400.0)
RBC: 4.51 Mil/uL (ref 3.87–5.11)
RDW: 14 % (ref 11.5–15.5)
WBC: 7 10*3/uL (ref 4.0–10.5)

## 2017-06-24 LAB — COMPREHENSIVE METABOLIC PANEL
ALT: 16 U/L (ref 0–35)
AST: 34 U/L (ref 0–37)
Albumin: 3.8 g/dL (ref 3.5–5.2)
Alkaline Phosphatase: 116 U/L (ref 39–117)
BILIRUBIN TOTAL: 1.3 mg/dL — AB (ref 0.2–1.2)
BUN: 7 mg/dL (ref 6–23)
CALCIUM: 11.1 mg/dL — AB (ref 8.4–10.5)
CO2: 30 meq/L (ref 19–32)
CREATININE: 1 mg/dL (ref 0.40–1.20)
Chloride: 98 mEq/L (ref 96–112)
GFR: 61.15 mL/min (ref >60.00)
GLUCOSE: 120 mg/dL — AB (ref 70–99)
Potassium: 3.8 mEq/L (ref 3.5–5.1)
Sodium: 135 mEq/L (ref 135–145)
Total Protein: 7.9 g/dL (ref 6.0–8.3)

## 2017-06-24 LAB — HEPATIC FUNCTION PANEL
ALBUMIN: 3.8 g/dL (ref 3.5–5.2)
ALK PHOS: 116 U/L (ref 39–117)
ALT: 16 U/L (ref 0–35)
AST: 34 U/L (ref 0–37)
BILIRUBIN DIRECT: 0.4 mg/dL — AB (ref 0.0–0.3)
TOTAL PROTEIN: 7.9 g/dL (ref 6.0–8.3)
Total Bilirubin: 1.3 mg/dL — ABNORMAL HIGH (ref 0.2–1.2)

## 2017-06-24 LAB — PROTIME-INR
INR: 1.3 ratio — ABNORMAL HIGH (ref 0.8–1.0)
Prothrombin Time: 13.7 s — ABNORMAL HIGH (ref 9.6–13.1)

## 2017-06-27 ENCOUNTER — Other Ambulatory Visit: Payer: Self-pay

## 2017-06-27 DIAGNOSIS — K7030 Alcoholic cirrhosis of liver without ascites: Secondary | ICD-10-CM

## 2017-06-28 ENCOUNTER — Encounter (HOSPITAL_COMMUNITY): Payer: Self-pay | Admitting: Licensed Clinical Social Worker

## 2017-06-28 ENCOUNTER — Ambulatory Visit (INDEPENDENT_AMBULATORY_CARE_PROVIDER_SITE_OTHER): Payer: 59 | Admitting: Licensed Clinical Social Worker

## 2017-06-28 DIAGNOSIS — F1020 Alcohol dependence, uncomplicated: Secondary | ICD-10-CM

## 2017-06-28 DIAGNOSIS — F1421 Cocaine dependence, in remission: Secondary | ICD-10-CM

## 2017-06-28 NOTE — Progress Notes (Signed)
  Weekly Group Progress Note  Program: OUTPATIENT SKILLS GROUP  Group Time: 5:30-6:30pm  Participation Level: Active  Behavioral Response: Appropriate and Sharing  Type of Therapy:  Psycho-education Group  Skills discussed: Challenging Myths/ Self Talk   Summary of Progress: Pt remains sober. She reports her liver levels have "all returned to normal and she and her doctors are totally amazed". Pt states she is feeling very happy and spent time w/ her family this week. She checked in as a 10/10 on a positive feelings scale.   Summary of Group: Counselor led 92min process time and 45 min psychoeducation time utilizing a handout from "DBT Skills for Interpersonal Effectiveness on Challenging Myths"   Archie Balboa, LPCA, LCASA

## 2017-06-29 ENCOUNTER — Ambulatory Visit (HOSPITAL_COMMUNITY): Payer: Self-pay | Admitting: Licensed Clinical Social Worker

## 2017-07-06 ENCOUNTER — Ambulatory Visit (INDEPENDENT_AMBULATORY_CARE_PROVIDER_SITE_OTHER): Payer: 59 | Admitting: Licensed Clinical Social Worker

## 2017-07-06 DIAGNOSIS — F1020 Alcohol dependence, uncomplicated: Secondary | ICD-10-CM | POA: Diagnosis not present

## 2017-07-07 ENCOUNTER — Encounter (HOSPITAL_COMMUNITY): Payer: Self-pay | Admitting: Licensed Clinical Social Worker

## 2017-07-07 NOTE — Progress Notes (Signed)
  Weekly Group Progress Note  Program: OUTPATIENT SKILLS GROUP  Group Time: 5:30-6:30pm  Participation Level: Minimal  Behavioral Response: Appropriate and Sharing  Type of Therapy:  Psycho-education Group  Skills discussed: Pleasurable activities   Summary of Progress: Pt continues to sustain healthy progress towards her recovery from mind altering chemicals. She reports she is going to 2 AA meetings per week. She states she is an 8/10 on a feelings scale w/ 10 being best. Pt discusses feeling a little nervous about a recent doctors visit in which she was told that "she'll likely never be fully over her medical issues" and that she would always have to manage her sleep, diet, and light exercise.  Summary of Group: Pts were active and engaged in a process and Psychoeducational section of 1 hour group. They shared openly about their issues in recovery from mind-altering substances and mental health issues. Pts were provided a handout on pleasurable activities and asked to brainstorm ways to increase their "fun factor".     Archie Balboa, LPCA, LCASA

## 2017-07-13 ENCOUNTER — Ambulatory Visit (INDEPENDENT_AMBULATORY_CARE_PROVIDER_SITE_OTHER): Payer: 59 | Admitting: Licensed Clinical Social Worker

## 2017-07-13 DIAGNOSIS — F1021 Alcohol dependence, in remission: Secondary | ICD-10-CM

## 2017-07-13 IMAGING — US US PARACENTESIS
1 series · 7 of 7 positions shown · non-contrast
Comparison: none

INDICATION: Alcoholic hepatitis, portal hypertension, recurrent ascites. Request
made for diagnostic and therapeutic paracentesis.

[Series 1: us paracentesis · 0.26mm/px · 7 of 7 slices shown]
[im 1/7]
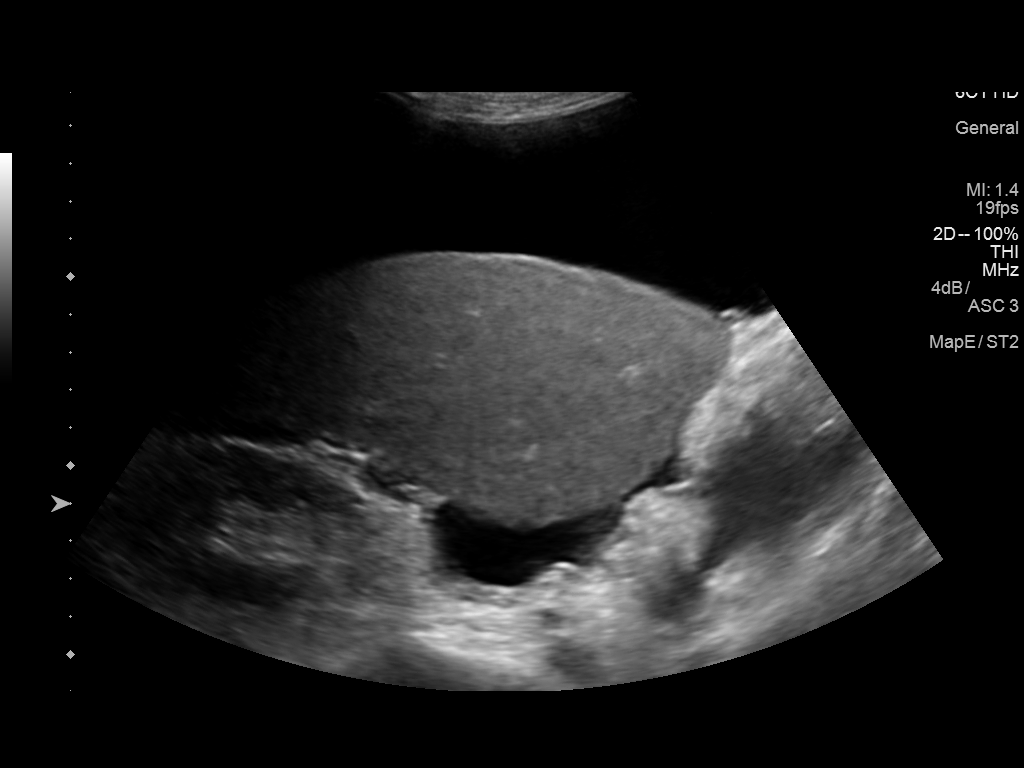
[im 2/7]
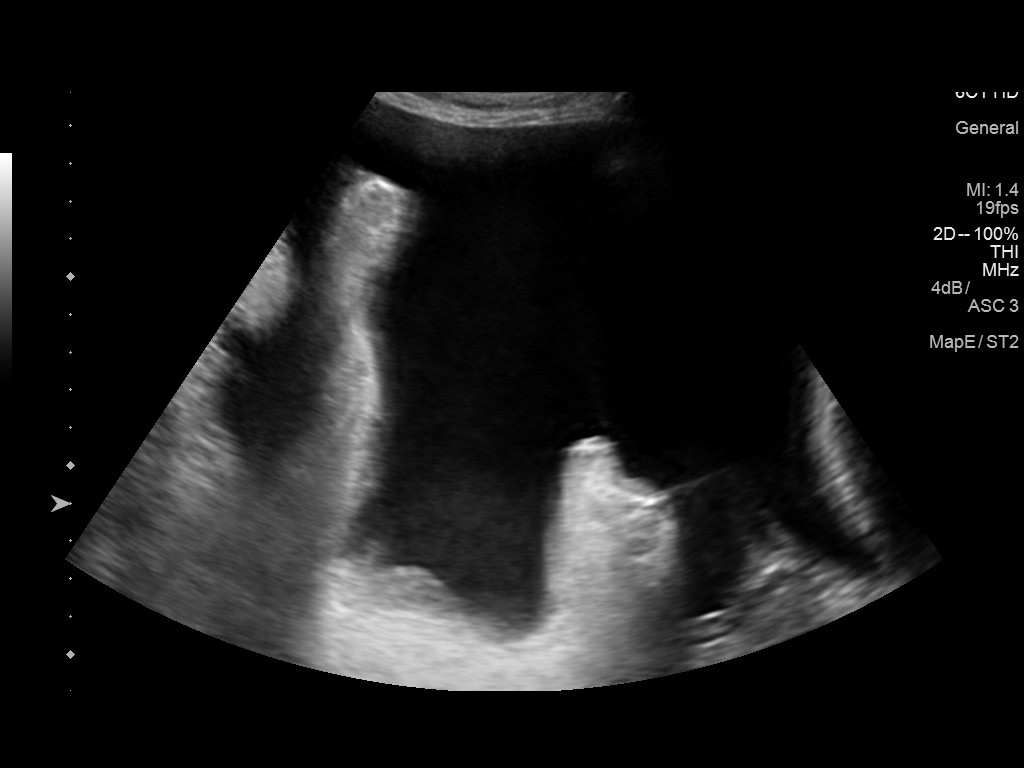
[im 3/7]
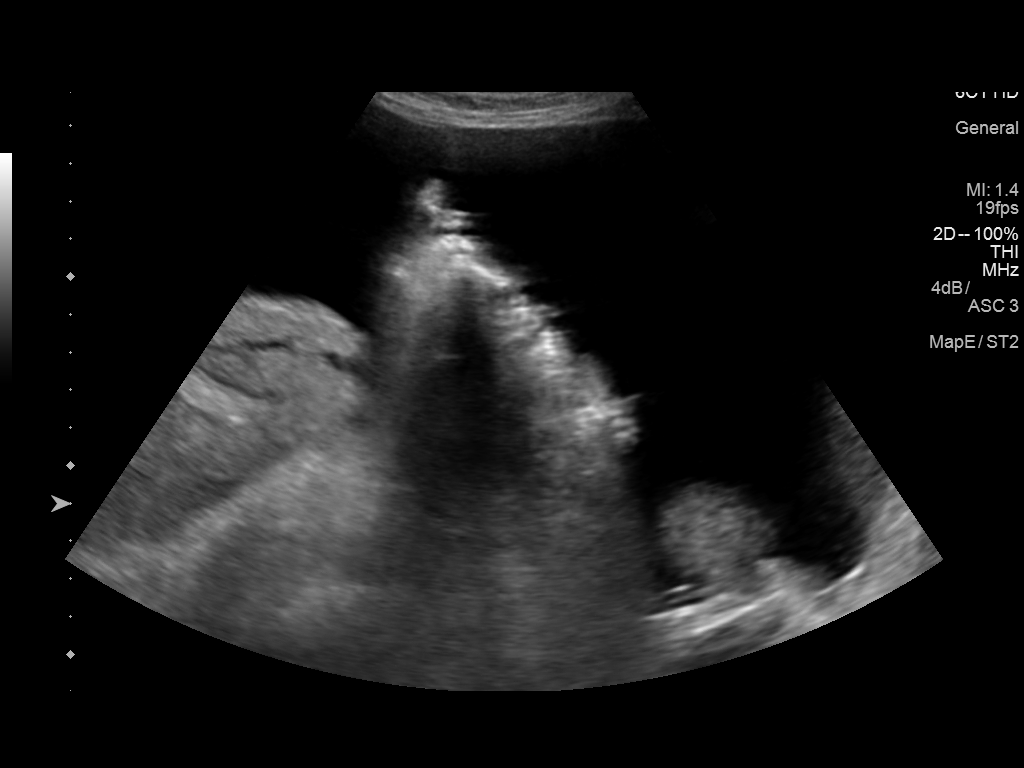
[im 4/7]
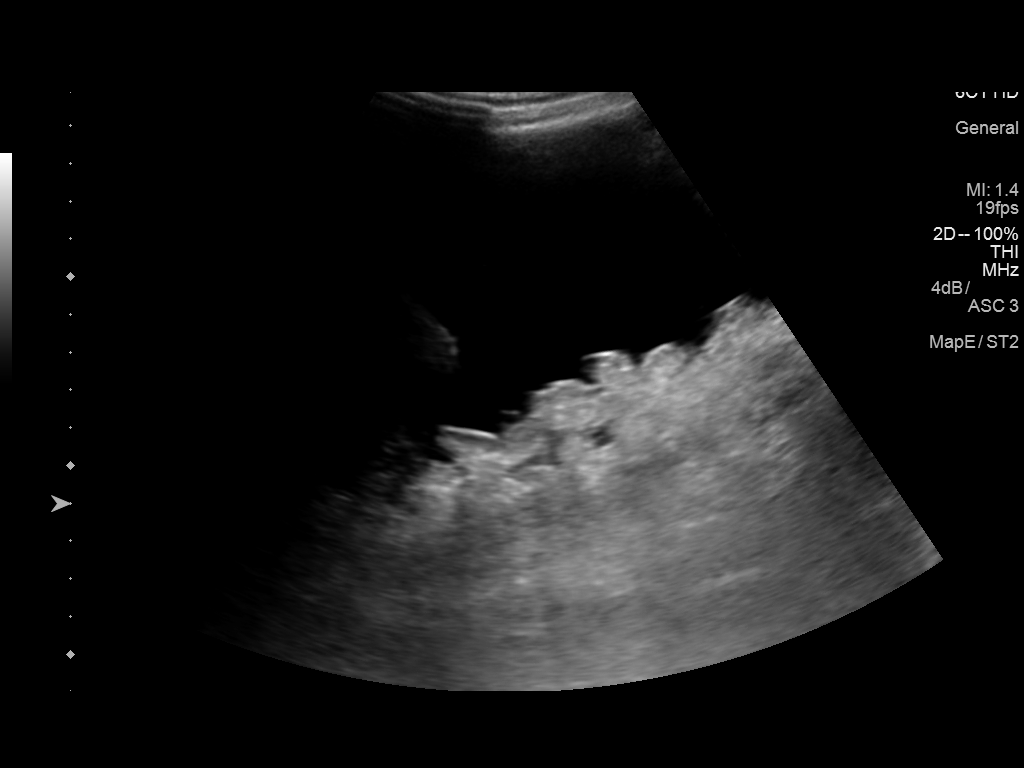
[im 5/7]
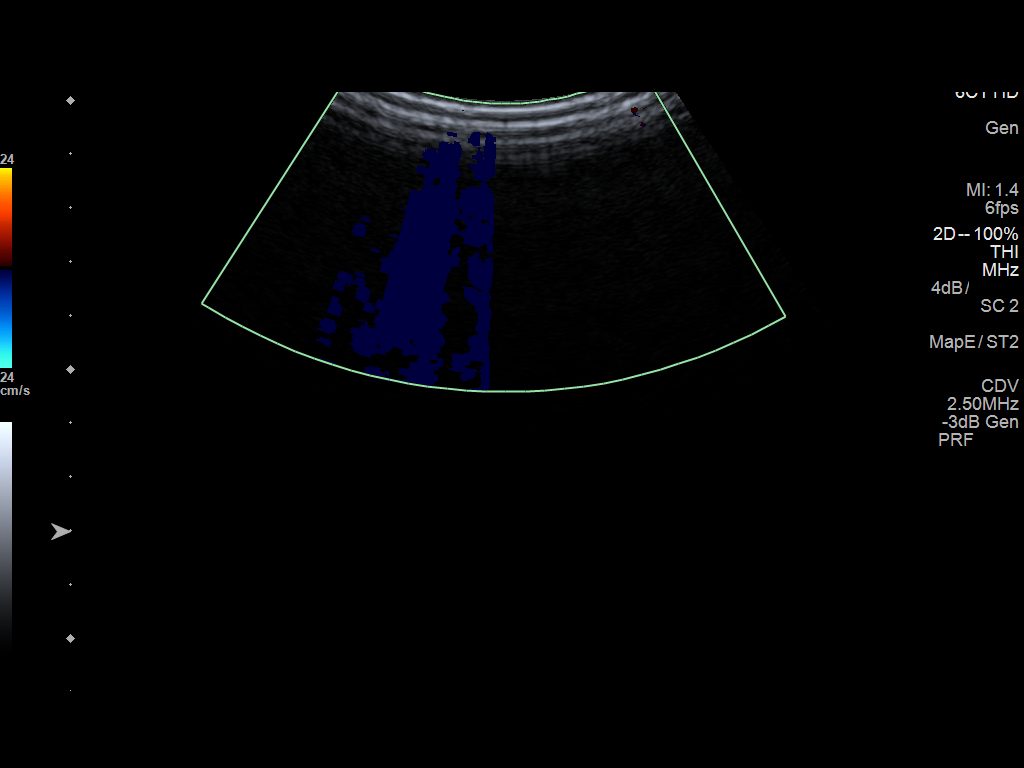
[im 6/7]
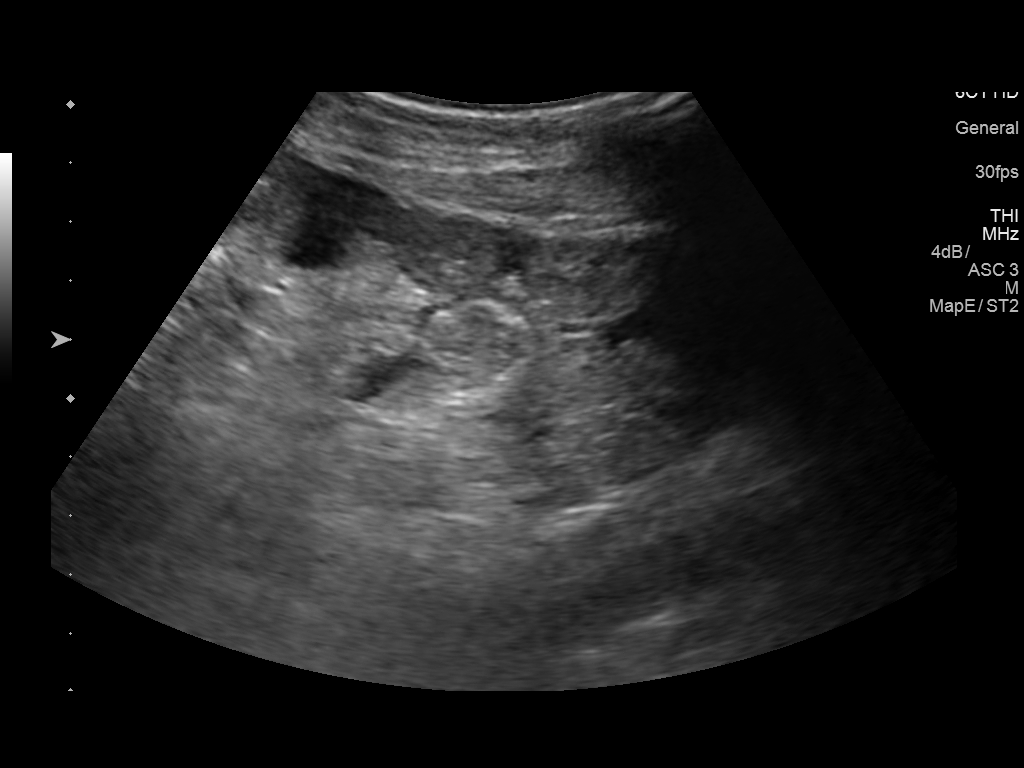
[im 7/7]
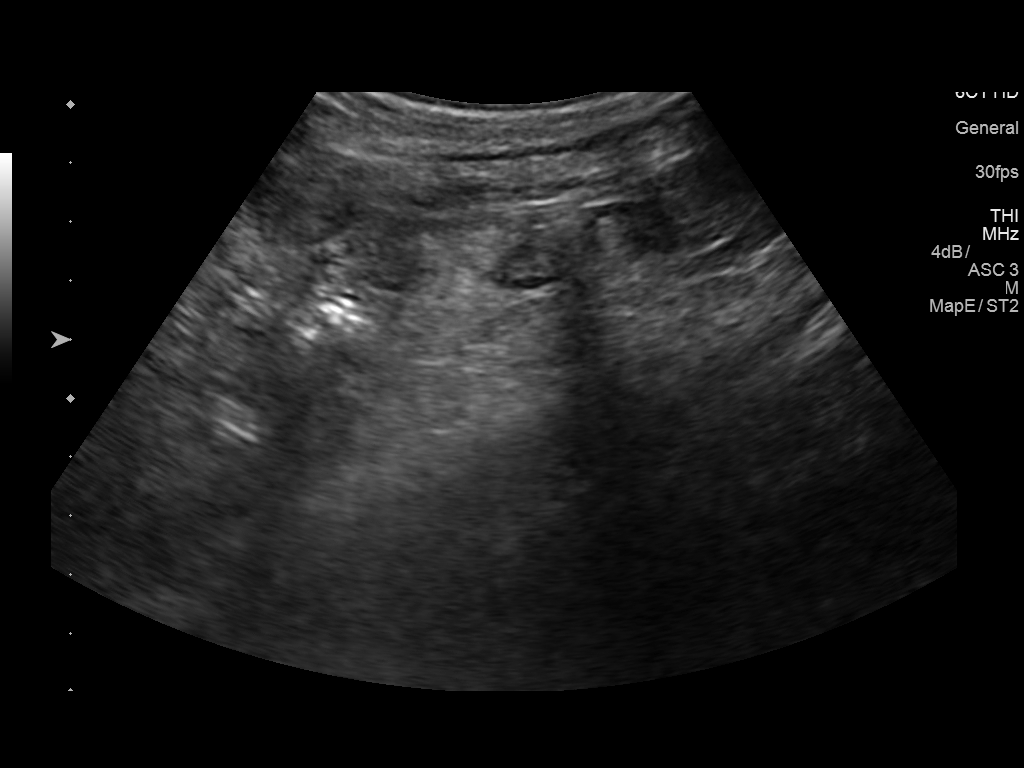

[7 of 7 positions shown; findings below may reference images not displayed]

EXAM:
ULTRASOUND GUIDED DIAGNOSTIC AND THERAPEUTIC PARACENTESIS

MEDICATIONS:
None.

COMPLICATIONS:
None immediate.

PROCEDURE:
Informed written consent was obtained from the patient after a
discussion of the risks, benefits and alternatives to treatment. A
timeout was performed prior to the initiation of the procedure.

Initial ultrasound scanning demonstrates a large amount of ascites
within the right lower abdominal quadrant. The right lower abdomen
was prepped and draped in the usual sterile fashion. 1% lidocaine
was used for local anesthesia.

Following this, a Yueh catheter was introduced. An ultrasound image
was saved for documentation purposes. The paracentesis was
performed. The catheter was removed and a dressing was applied. The
patient tolerated the procedure well without immediate post
procedural complication.
FINDINGS: A total of approximately 5.5 liters of yellow fluid was removed.
Samples were sent to the laboratory as requested by the clinical
team.
IMPRESSION: Successful ultrasound-guided diagnostic and therapeutic paracentesis
yielding 5.5 liters of peritoneal fluid.

## 2017-07-19 ENCOUNTER — Encounter: Payer: Self-pay | Admitting: Family Medicine

## 2017-07-19 ENCOUNTER — Ambulatory Visit (INDEPENDENT_AMBULATORY_CARE_PROVIDER_SITE_OTHER): Payer: Managed Care, Other (non HMO) | Admitting: Family Medicine

## 2017-07-19 VITALS — BP 108/66 | HR 98 | Temp 97.6°F | Resp 16 | Ht 60.0 in | Wt 119.5 lb

## 2017-07-19 DIAGNOSIS — Z23 Encounter for immunization: Secondary | ICD-10-CM | POA: Diagnosis not present

## 2017-07-19 DIAGNOSIS — M722 Plantar fascial fibromatosis: Secondary | ICD-10-CM

## 2017-07-19 DIAGNOSIS — G6289 Other specified polyneuropathies: Secondary | ICD-10-CM | POA: Diagnosis not present

## 2017-07-19 DIAGNOSIS — M159 Polyosteoarthritis, unspecified: Secondary | ICD-10-CM | POA: Insufficient documentation

## 2017-07-19 DIAGNOSIS — G629 Polyneuropathy, unspecified: Secondary | ICD-10-CM | POA: Insufficient documentation

## 2017-07-19 NOTE — Patient Instructions (Signed)
A few things to remember from today's visit:   Plantar fasciitis  Generalized osteoarthritis of multiple sites  Other polyneuropathy  Osteoarthritis is a chronic condition and gets worse with age.  The following may help:  Over the counter topical medications: Icy Hot or Asper cream with Lidocaine. Tai Chi or PT. Fall prevention. Fish oil, over the counter Megared for example, 2 capsules daily.   Plantar Fasciitis Plantar fasciitis is a painful foot condition that affects the heel. It occurs when the band of tissue that connects the toes to the heel bone (plantar fascia) becomes irritated. This can happen after exercising too much or doing other repetitive activities (overuse injury). The pain from plantar fasciitis can range from mild irritation to severe pain that makes it difficult for you to walk or move. The pain is usually worse in the morning or after you have been sitting or lying down for a while. What are the causes? This condition may be caused by:  Standing for long periods of time.  Wearing shoes that do not fit.  Doing high-impact activities, including running, aerobics, and ballet.  Being overweight.  Having an abnormal way of walking (gait).  Having tight calf muscles.  Having high arches in your feet.  Starting a new athletic activity.  What are the signs or symptoms? The main symptom of this condition is heel pain. Other symptoms include:  Pain that gets worse after activity or exercise.  Pain that is worse in the morning or after resting.  Pain that goes away after you walk for a few minutes.  How is this diagnosed? This condition may be diagnosed based on your signs and symptoms. Your health care provider will also do a physical exam to check for:  A tender area on the bottom of your foot.  A high arch in your foot.  Pain when you move your foot.  Difficulty moving your foot.  You may also need to have imaging studies to confirm the  diagnosis. These can include:  X-rays.  Ultrasound.  MRI.  How is this treated? Treatment for plantar fasciitis depends on the severity of the condition. Your treatment may include:  Rest, ice, and over-the-counter pain medicines to manage your pain.  Exercises to stretch your calves and your plantar fascia.  A splint that holds your foot in a stretched, upward position while you sleep (night splint).  Physical therapy to relieve symptoms and prevent problems in the future.  Cortisone injections to relieve severe pain.  Extracorporeal shock wave therapy (ESWT) to stimulate damaged plantar fascia with electrical impulses. It is often used as a last resort before surgery.  Surgery, if other treatments have not worked after 12 months.  Follow these instructions at home:  Take medicines only as directed by your health care provider.  Avoid activities that cause pain.  Roll the bottom of your foot over a bag of ice or a bottle of cold water. Do this for 20 minutes, 3-4 times a day.  Perform simple stretches as directed by your health care provider.  Try wearing athletic shoes with air-sole or gel-sole cushions or soft shoe inserts.  Wear a night splint while sleeping, if directed by your health care provider.  Keep all follow-up appointments with your health care provider. How is this prevented?  Do not perform exercises or activities that cause heel pain.  Consider finding low-impact activities if you continue to have problems.  Lose weight if you need to. The best way to  prevent plantar fasciitis is to avoid the activities that aggravate your plantar fascia. Contact a health care provider if:  Your symptoms do not go away after treatment with home care measures.  Your pain gets worse.  Your pain affects your ability to move or do your daily activities. This information is not intended to replace advice given to you by your health care provider. Make sure you  discuss any questions you have with your health care provider. Document Released: 05/04/2001 Document Revised: 01/12/2016 Document Reviewed: 06/19/2014 Elsevier Interactive Patient Education  2018 [employer].   Please be sure medication list is accurate. If a new problem present, please set up appointment sooner than planned today.

## 2017-07-19 NOTE — Progress Notes (Signed)
ACUTE VISIT   HPI:  Chief Complaint  Patient presents with  . Foot Pain    Mercedes Mitchell is a 56 y.o. female, who is here today complaining of intermittent bilateral heel pain for "a long time" but worse for the past 4 weeks. Pain is sharp, intermittently, exacerbated by standing up and walking after prolonged rest, also in the morning when she first gets up. It is alleviated after walking a few steps. She denies injury or new shoe wear. She has not noted local edema or erythema. She has not taking OTC medication. She is wearing shoes with soft inserts.   Joint pain: Shoulder,wrist,DIP,PIP,hip,knee,and ankle bilateral for a few months. Aching, intermittent pain. Pain is exacerbated by certain activities that involve repetitive joint movement. Alleviating factors are rest and position changes. She has not noted edema or erythema of joints. She has taken OTC NSAIDs a couple times.  She also mentions numbness and tingling, intermittent on feet.  She has had this for a while now, she denies any skin lesions on affected area or focal weakness. Problem has been stable.  Hx of alcoholic cirrhosis.  Lab Results  Component Value Date   BJYNWGNF62 130 12/25/2016   Lab Results  Component Value Date   TSH 1.446 02/19/2017    Review of Systems  Constitutional: Negative for appetite change, chills, fatigue and fever.  HENT: Negative for mouth sores, nosebleeds, sore throat and trouble swallowing.   Respiratory: Negative for shortness of breath and wheezing.   Cardiovascular: Negative for chest pain, palpitations and leg swelling.  Gastrointestinal: Negative for abdominal pain, nausea and vomiting.  Endocrine: Negative for cold intolerance, heat intolerance, polydipsia, polyphagia and polyuria.  Genitourinary: Negative for decreased urine volume and hematuria.  Musculoskeletal: Positive for arthralgias. Negative for joint swelling and myalgias.  Skin: Negative for  rash and wound.  Neurological: Positive for numbness. Negative for syncope, weakness and headaches.  Psychiatric/Behavioral: Negative for confusion. The patient is nervous/anxious.       Current Outpatient Medications on File Prior to Visit  Medication Sig Dispense Refill  . feeding supplement, ENSURE ENLIVE, (ENSURE ENLIVE) LIQD Take 237 mLs by mouth 3 (three) times daily between meals. 90 Bottle 0  . folic acid (FOLVITE) 1 MG tablet Take 1 tablet (1 mg total) by mouth daily. 100 tablet 5  . furosemide (LASIX) 20 MG tablet Take 4 tablets (80 mg total) by mouth daily. (Patient taking differently: Take 60 mg by mouth daily. )    . lactulose (CHRONULAC) 10 GM/15ML solution Take 30 mLs (20 g total) by mouth 2 (two) times daily. (Patient taking differently: Take 30 g by mouth 3 (three) times daily. ) 1800 mL 1  . Multiple Vitamin (MULTIVITAMIN WITH MINERALS) TABS tablet Take 1 tablet by mouth daily. 30 tablet 0  . mupirocin cream (BACTROBAN) 2 % Apply 1 application topically 2 (two) times daily. 30 g 0  . nystatin (MYCOSTATIN) 100000 UNIT/ML suspension Take 5 mLs (500,000 Units total) by mouth 3 (three) times daily. (Patient taking differently: Take 5 mLs by mouth as needed. ) 473 mL 0  . pantoprazole (PROTONIX) 40 MG tablet Take 1 tablet (40 mg total) by mouth 2 (two) times daily. 180 tablet 0  . QUEtiapine (SEROQUEL) 25 MG tablet Take 1 tablet (25 mg total) by mouth at bedtime as needed (sleep). May take 2 tablets if needed 180 tablet 1  . spironolactone (ALDACTONE) 100 MG tablet Take 100 mg by mouth 2 (two)  times daily.      No current facility-administered medications on file prior to visit.      Past Medical History:  Diagnosis Date  . 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  paracentesis 2.6 liters 5/4: no SBP.    Marland Kitchen Alcoholism ([place]) 12/2016  . Candida esophagitis ([place])   . Cirrhosis ([place])   . DVT (deep venous  thrombosis) ([place])   . Dysphagia 2011  . Esophagitis   . Hepatic steatosis   . Hiatal hernia   . Hiatal hernia   . Macrocytic anemia 12/2016  . Malnutrition ([place]) 12/2016  . Panic type anxiety neurosis 2011  . Portal hypertension ([place])   . Portal hypertensive gastropathy ([facility])    Allergies  Allergen Reactions  . Adhesive [Tape]     Social History   Socioeconomic History  . Marital status: Married    Spouse name: None  . Number of children: None  . Years of education: None  . Highest education level: None  Social Needs  . Financial resource strain: None  . Food insecurity - worry: None  . Food insecurity - inability: None  . Transportation needs - medical: None  . Transportation needs - non-medical: None  Occupational History  . Occupation: Unemployed  Tobacco Use  . Smoking status: Former Smoker    Types: Cigarettes  . Smokeless tobacco: Never Used  . Tobacco comment: Reports she quit "about 6 months ago"  Substance and Sexual Activity  . Alcohol use: Yes    Comment: Last drink: 6/18  . Drug use: No    Comment: Last use Cocaine 11/17, reports last use "over a year", [facility] 2/18, reports "a year" ago  . Sexual activity: None  Other Topics Concern  . None  Social History Narrative   Married, children, not working    Vitals:   07/19/17 1229  BP: 108/66  Pulse: 98  Resp: 16  Temp: 97.6 F (36.4 C)  SpO2: 99%   Body mass index is 23.34 kg/m.   Physical Exam  Nursing note and vitals reviewed. Constitutional: She is oriented to person, place, and time. She appears well-developed and well-nourished. She does not appear ill. No distress.  HENT:  Head: Normocephalic and atraumatic.  Mouth/Throat: Oropharynx is clear and moist and mucous membranes are normal.  Eyes: Conjunctivae are normal.  Cardiovascular: Normal rate and regular rhythm.  No murmur heard. Pulses:      Dorsalis pedis pulses are 2+ on the right side, and 2+ on the left side.  Respiratory:  Effort normal and breath sounds normal. No respiratory distress.  GI: Soft. She exhibits no mass. There is no tenderness.  Musculoskeletal: She exhibits no edema.       Right ankle: Achilles tendon exhibits no pain and normal Thompson's test results.       Left ankle: Achilles tendon exhibits no pain and normal Thompson's test results.  Overall no significant limitation of ROM: shoulder,wrist,IP,hip,knee,and ankle bilateral. No signs of synovitis or significant deformities.  L>R foot:Tenderness upon palpation of heel mainly medial and lateral. Left foot with pain at the medial insertion of plantar fascia into calcaneous.   Dorsal flexion of first MTP does not elicits pain.  No edema or erythema appreciated on area. Antalgic gait.  Lymphadenopathy:    She has no cervical adenopathy.  Neurological: She is alert and oriented to person, place, and time. She has normal strength.  Stable gait with  no assistance needed. Monofilament normal bilateral (foot).  Skin: Skin is warm. No rash noted. No erythema.  Psychiatric: Her mood appears anxious.  Fairly groomed, good eye contact.   ASSESSMENT AND PLAN:   Mercedes Mitchell was seen today for foot pain.  Diagnoses and all orders for this visit:  Plantar fasciitis  Treatment options discussed. Recommend comfortable shoe wear, stretching exercises, and night splint. If not better she was instructed to arrange appointment with podiatrist.  Generalized osteoarthritis of multiple sites  Educated about diagnosis, prognosis, and treatment options. Due to history of alcoholic cirrhosis, recommend avoiding NSAIDs.  She can take acetaminophen after 2 g/day. OTC fish oil and turmeric may also help.  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 Foot care discussed. Instructed about warning signs. For now I will obtain further workup is needed given her history of liver disease I am not recommending pharmacologic treatment at this  time, Cymbalta or Gabapentin may be considered in the future.  We will continue monitoring.  Need for 23-polyvalent pneumococcal polysaccharide vaccine -     Pneumococcal polysaccharide vaccine 23-valent greater than or equal to 2yo subcutaneous/IM  Need for influenza vaccination -     Flu Vaccine QUAD 36+ mos IM   -Mercedes Mitchell was advised to seek immediate medical attention if sudden worsening symptoms or to follow if they persist or if new concerns arise.       Bryant Lipps G. [place], MD  [facility]. [place] office.

## 2017-07-20 ENCOUNTER — Ambulatory Visit (INDEPENDENT_AMBULATORY_CARE_PROVIDER_SITE_OTHER): Payer: 59 | Admitting: Licensed Clinical Social Worker

## 2017-07-20 DIAGNOSIS — F1021 Alcohol dependence, in remission: Secondary | ICD-10-CM

## 2017-07-21 ENCOUNTER — Encounter (HOSPITAL_COMMUNITY): Payer: Self-pay | Admitting: Licensed Clinical Social Worker

## 2017-07-21 NOTE — Progress Notes (Signed)
  Weekly Group Progress Note  Program: OUTPATIENT SKILLS GROUP  Group Time: 5:30-6:30pm  Participation Level: Active  Behavioral Response: Appropriate and Sharing  Type of Therapy:  Psycho-education Group  Skills discussed: Planning for [employer] of Progress: Pt continues to report strong success w/ her sobriety and ongoing anxiety related to "irrational fears that something bad will happen to her boys". Pt shares openly in group and provides good feedback for other members. She continues to socially isolate, getting her social needs primarily met through her immediate family and this group.   Summary of Group: Counselor led a brief 30 min process session and 45 min psychoeducation time geared at helping pts plan for stress during holidays while providing them DBT coping skills for distress tolerance.    Archie Balboa, LPCA, LCASA

## 2017-07-22 ENCOUNTER — Encounter (HOSPITAL_COMMUNITY): Payer: Self-pay | Admitting: Licensed Clinical Social Worker

## 2017-07-22 NOTE — Progress Notes (Signed)
  Weekly Group Progress Note  Program: OUTPATIENT SKILLS GROUP  Group Time: 5:30-6:30pm  Participation Level: Active  Behavioral Response: Appropriate and Sharing  Type of Therapy:  Psycho-education Group  Skills discussed: Control, Spirituality, HP   Summary of Progress: Pt continues to make some progress in sobriety. She continues to avoid AA meetings and social outlets. She reports she had a great thanksgiving and became tearful when talking about how "it was a miracle come true" to live this long. Pt admitted she continues to struggle w/ what she believes are irrational beliefs.   Summary of Group: Pts were active and engaged in session. Pt's provided feedback for each other's general mental health symptoms including depression, anxiety, and cravings for substance abusive bx's. Pt's were led in a brief process time and a psychoeducation session. Topics included Spirituality, control, and learning to let go of desire to control the uncontrollable.    Archie Balboa, LPCA, LCASA

## 2017-07-26 ENCOUNTER — Emergency Department (HOSPITAL_COMMUNITY): Payer: Managed Care, Other (non HMO)

## 2017-07-26 ENCOUNTER — Ambulatory Visit: Payer: Self-pay | Admitting: *Deleted

## 2017-07-26 ENCOUNTER — Other Ambulatory Visit: Payer: Self-pay

## 2017-07-26 ENCOUNTER — Encounter (HOSPITAL_COMMUNITY): Payer: Self-pay | Admitting: Emergency Medicine

## 2017-07-26 ENCOUNTER — Emergency Department (HOSPITAL_COMMUNITY)
Admission: EM | Admit: 2017-07-26 | Discharge: 2017-07-26 | Disposition: A | Discharge status: Home / Self Care | Payer: Managed Care, Other (non HMO) | Attending: Emergency Medicine | Admitting: Emergency Medicine

## 2017-07-26 DIAGNOSIS — Z79899 Other long term (current) drug therapy: Secondary | ICD-10-CM | POA: Diagnosis not present

## 2017-07-26 DIAGNOSIS — Z87891 Personal history of nicotine dependence: Secondary | ICD-10-CM | POA: Diagnosis not present

## 2017-07-26 DIAGNOSIS — R1031 Right lower quadrant pain: Secondary | ICD-10-CM | POA: Insufficient documentation

## 2017-07-26 LAB — CBC WITH DIFFERENTIAL/PLATELET
BASOS PCT: 0 %
Basophils Absolute: 0 10*3/uL (ref 0.0–0.1)
EOS ABS: 0.1 10*3/uL (ref 0.0–0.7)
EOS PCT: 0 %
HCT: 41.5 % (ref 36.0–46.0)
HEMOGLOBIN: 14.3 g/dL (ref 12.0–15.0)
LYMPHS ABS: 2.3 10*3/uL (ref 0.7–4.0)
Lymphocytes Relative: 19 %
MCH: 31.2 pg (ref 26.0–34.0)
MCHC: 34.5 g/dL (ref 30.0–36.0)
MCV: 90.4 fL (ref 78.0–100.0)
Monocytes Absolute: 1.5 10*3/uL — ABNORMAL HIGH (ref 0.1–1.0)
Monocytes Relative: 13 %
NEUTROS PCT: 68 %
Neutro Abs: 8.3 10*3/uL — ABNORMAL HIGH (ref 1.7–7.7)
PLATELETS: 243 10*3/uL (ref 150–400)
RBC: 4.59 MIL/uL (ref 3.87–5.11)
RDW: 13.7 % (ref 11.5–15.5)
WBC: 12.1 10*3/uL — AB (ref 4.0–10.5)

## 2017-07-26 LAB — URINALYSIS, ROUTINE W REFLEX MICROSCOPIC
BILIRUBIN URINE: NEGATIVE
Glucose, UA: NEGATIVE mg/dL
HGB URINE DIPSTICK: NEGATIVE
Ketones, ur: NEGATIVE mg/dL
Leukocytes, UA: NEGATIVE
NITRITE: NEGATIVE
PROTEIN: NEGATIVE mg/dL
SPECIFIC GRAVITY, URINE: 1.01 (ref 1.005–1.030)
pH: 7 (ref 5.0–8.0)

## 2017-07-26 LAB — COMPREHENSIVE METABOLIC PANEL
ALK PHOS: 114 U/L (ref 38–126)
ALT: 20 U/L (ref 14–54)
AST: 41 U/L (ref 15–41)
Albumin: 4.1 g/dL (ref 3.5–5.0)
Anion gap: 9 (ref 5–15)
BILIRUBIN TOTAL: 1.4 mg/dL — AB (ref 0.3–1.2)
BUN: 15 mg/dL (ref 6–20)
CALCIUM: 11 mg/dL — AB (ref 8.9–10.3)
CHLORIDE: 100 mmol/L — AB (ref 101–111)
CO2: 26 mmol/L (ref 22–32)
CREATININE: 1.01 mg/dL — AB (ref 0.44–1.00)
Glucose, Bld: 118 mg/dL — ABNORMAL HIGH (ref 65–99)
Potassium: 4 mmol/L (ref 3.5–5.1)
Sodium: 135 mmol/L (ref 135–145)
TOTAL PROTEIN: 8.2 g/dL — AB (ref 6.5–8.1)

## 2017-07-26 LAB — I-STAT CG4 LACTIC ACID, ED: LACTIC ACID, VENOUS: 0.91 mmol/L (ref 0.5–1.9)

## 2017-07-26 MED ORDER — FENTANYL CITRATE (PF) 100 MCG/2ML IJ SOLN
50.0000 ug | Freq: Once | INTRAMUSCULAR | Status: AC
  Administered 2017-07-26: 50 ug via INTRAVENOUS
  Filled 2017-07-26: qty 2

## 2017-07-26 MED ORDER — IOPAMIDOL (ISOVUE-300) INJECTION 61%
100.0000 mL | Freq: Once | INTRAVENOUS | Status: AC | PRN
  Administered 2017-07-26: 100 mL via INTRAVENOUS

## 2017-07-26 MED ORDER — IOPAMIDOL (ISOVUE-300) INJECTION 61%
INTRAVENOUS | Status: AC
  Filled 2017-07-26: qty 100

## 2017-07-26 NOTE — Telephone Encounter (Signed)
I confirmed patient has checked into [facility] now.

## 2017-07-26 NOTE — Telephone Encounter (Signed)
Woke up during the night with pain in her right side across from her navel.  She has a fever of 101.   Having nausea but no vomiting.   She informed me of her history of being in and out of the hospital with liver problems recently.   I encouraged her to go to the ED.   Dr. Martinique has also advised her during their office visits to go to the ED should she experience any kind of abd pain. She is going to the ED at [facility].  Reason for Disposition . [1] SEVERE pain (e.g., excruciating) AND [2] present > 1 hour  Answer Assessment - Initial Assessment Questions 1. LOCATION: "Where does it hurt?"     Hurting in my right side across from my belly button.   I have  A fever 101. 2. RADIATION: "Does the pain shoot anywhere else?" (e.g., chest, back)     It's going around to my back 3. ONSET: "When did the pain begin?" (e.g., minutes, hours or days ago)      During the night.   It woke me up. 4. SUDDEN: "Gradual or sudden onset?"     Gradual but as the night went on I started feeling worse 5. PATTERN "Does the pain come and go, or is it constant?"    - If constant: "Is it getting better, staying the same, or worsening?"      (Note: Constant means the pain never goes away completely; most serious pain is constant and it progresses)     - If intermittent: "How long does it last?" "Do you have pain now?"     (Note: Intermittent means the pain goes away completely between bouts)     Take a breath it's an 8.   Otherwise it's dull pain. 6. SEVERITY: "How bad is the pain?"  (e.g., Scale 1-10; mild, moderate, or severe)   - MILD (1-3): doesn't interfere with normal activities, abdomen soft and not tender to touch    - MODERATE (4-7): interferes with normal activities or awakens from sleep, tender to touch    - SEVERE (8-10): excruciating pain, doubled over, unable to do any normal activities      *No Answer* 7. RECURRENT SYMPTOM: "Have you ever had this type of abdominal pain before?" If so, ask:  "When was the last time?" and "What happened that time?"      No.   I have liver issues.  Been in and out of the hospital a lot. 8. CAUSE: "What do you think is causing the abdominal pain?"     I don't know.   I have not eaten anything out of the normal 9. RELIEVING/AGGRAVATING FACTORS: "What makes it better or worse?" (e.g., movement, antacids, bowel movement)     Deep breathing, coughing makes it worse. 10. OTHER SYMPTOMS: "Has there been any vomiting, diarrhea, constipation, or urine problems?"       Nausea.  No vomiting.   Been constipated yesterday and day before.   I take Lactulose.    I had 2 BMs yesterday.    No UTI symptoms. 11. PREGNANCY: "Is there any chance you are pregnant?" "When was your last menstrual period?"       No  Protocols used: ABDOMINAL PAIN - [facility]

## 2017-07-26 NOTE — ED Triage Notes (Signed)
RLQ abd pain with fever since last night. Denies urinary symptoms.

## 2017-07-26 NOTE — Discharge Instructions (Signed)
Return here once for fever above 102, worsening abdominal pain, confusion, vomiting, or any other problems.

## 2017-07-26 NOTE — ED Notes (Signed)
Patient transported to CT 

## 2017-07-26 NOTE — ED Provider Notes (Signed)
[facility] DEPT Provider Note   CSN: [id number] Arrival date & time: 07/26/17  0857     History   Chief Complaint Chief Complaint  Patient presents with  . Abdominal Pain  . Fever    HPI Mercedes Mitchell is a 56 y.o. female.  56 year old female presents with less than 12-hour history of acute onset of right lower quadrant as well as right flank abdominal discomfort characterized as sharp and persistent with associated temperature at home up to 101.  She has had nausea but no vomiting.  No diarrhea.  No vaginal bleeding or discharge.  No dysuria or hematuria pain is persistent and worsened with movement.  Denies any prior history of same.  Prior history of cirrhosis but is not drinking alcohol for the past 7 months      Past Medical History:  Diagnosis Date  . 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  paracentesis 2.6 liters 5/4: no SBP.    Marland Kitchen Alcoholism ([place]) 12/2016  . Candida esophagitis ([place])   . Cirrhosis ([place])   . DVT (deep venous thrombosis) ([place])   . Dysphagia 2011  . Esophagitis   . Hepatic steatosis   . Hiatal hernia   . Hiatal hernia   . Macrocytic anemia 12/2016  . Malnutrition ([place]) 12/2016  . Panic type anxiety neurosis 2011  . Portal hypertension ([place])   . Portal hypertensive gastropathy [facility])     Patient Active Problem List   Diagnosis Date Noted  . Generalized osteoarthritis of multiple sites 07/19/2017  . Peripheral neuropathy 07/19/2017  . Varicose veins of both lower extremities 05/24/2017  . Sepsis ([place]) 03/26/2017  . Community acquired pneumonia of right middle lobe of lung ([place])   . Sacral decubitus ulcer, stage III ([place]) 02/18/2017  . Abdominal pain 02/10/2017  . Acute deep vein thrombosis (DVT) of right femoral vein ([place]) 02/10/2017  . Hyponatremia 02/10/2017  . DVT (deep venous thrombosis) ([place]) 02/10/2017  . Alcoholic cirrhosis of liver  with ascites ([place]) 01/12/2017  . GI bleeding 01/12/2017  . Current every day smoker 01/12/2017  . Ascites   . Coffee ground emesis   . Acute esophagitis   . Protein-calorie malnutrition, severe 12/24/2016  . Acute alcoholic hepatitis 83/15/1761  . ETOH abuse 12/23/2016  . Macrocytic anemia 12/21/2016  . 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 DYSPHAGIA 03/25/2010    Past Surgical History:  Procedure Laterality Date  . ESOPHAGOGASTRODUODENOSCOPY N/A 12/25/2016   Procedure: ESOPHAGOGASTRODUODENOSCOPY (EGD);  Surgeon: Jerene Bears, MD;  Location: [facility];  Service: Endoscopy;  Laterality: N/A;  . IR PARACENTESIS  02/02/2017  . IR TRANSCATHETER BX  02/02/2017  . IR US GUIDE VASC ACCESS RIGHT  02/02/2017  . IR VENOGRAM HEPATIC W HEMODYNAMIC EVALUATION  02/02/2017    OB History    No data available       Home Medications    Prior to Admission medications   Medication Sig Start Date End Date Taking? Authorizing Provider  feeding supplement, ENSURE ENLIVE, (ENSURE ENLIVE) LIQD Take 237 mLs by mouth 3 (three) times daily between meals. 12/28/16   Mikhail, Velta Addison, DO  folic acid (FOLVITE) 1 MG tablet Take 1 tablet (1 mg total) by mouth daily. 02/16/17   Dara Hoyer, PA-C  furosemide (LASIX) 20 MG tablet Take 4 tablets (80 mg total) by mouth daily. Patient taking differently: Take 60 mg by mouth daily.  02/19/17   Hongalgi,  Lenis Dickinson, MD  lactulose (CHRONULAC) 10 GM/15ML solution Take 30 mLs (20 g total) by mouth 2 (two) times daily. Patient taking differently: Take 30 g by mouth 3 (three) times daily.  02/18/17   Pyrtle, Lajuan Lines, MD  Multiple Vitamin (MULTIVITAMIN WITH MINERALS) TABS tablet Take 1 tablet by mouth daily. 12/29/16   Mikhail, Velta Addison, DO  mupirocin cream (BACTROBAN) 2 % Apply 1 application topically 2 (two) times daily. 04/04/17   Martinique, Betty G, MD  nystatin (MYCOSTATIN) 100000 UNIT/ML suspension Take 5 mLs (500,000 Units total) by mouth 3 (three) times daily. Patient  taking differently: Take 5 mLs by mouth as needed.  05/18/17   Martinique, Betty G, MD  pantoprazole (PROTONIX) 40 MG tablet Take 1 tablet (40 mg total) by mouth 2 (two) times daily. 06/09/17   Pyrtle, Lajuan Lines, MD  QUEtiapine (SEROQUEL) 25 MG tablet Take 1 tablet (25 mg total) by mouth at bedtime as needed (sleep). May take 2 tablets if needed 04/27/17 04/27/18  Dara Hoyer, PA-C  spironolactone (ALDACTONE) 100 MG tablet Take 100 mg by mouth 2 (two) times daily.  04/05/17   [provider]    Family History Family History  Problem Relation Age of Onset  . Hyperlipidemia Father   . Heart disease Father   . Alcoholism Father   . Heart attack Father   . Alcoholism Mother   . Alcoholism Sister     Social History Social History   Tobacco Use  . Smoking status: Former Smoker    Types: Cigarettes  . Smokeless tobacco: Never Used  . Tobacco comment: Reports she quit "about 6 months ago"  Substance Use Topics  . Alcohol use: Yes    Comment: Last drink: 6/18  . Drug use: No    Comment: Last use Cocaine 11/17, reports last use "over a year", [facility] 2/18, reports "a year" ago     Allergies   Adhesive [tape]   Review of Systems Review of Systems  All other systems reviewed and are negative.    Physical Exam Updated Vital Signs BP 116/87 (BP Location: Right Arm)   Pulse (!) 123   Temp (!) 100.4 F (38 C) (Oral)   Resp 18   Ht 1.676 m (5\' 6" )   Wt 53.5 kg (118 lb)   SpO2 99%   BMI 19.05 kg/m   Physical Exam  Constitutional: She is oriented to person, place, and time. She appears well-developed and well-nourished.  Non-toxic appearance. No distress.  HENT:  Head: Normocephalic and atraumatic.  Eyes: Conjunctivae, EOM and lids are normal. Pupils are equal, round, and reactive to light.  Neck: Normal range of motion. Neck supple. No tracheal deviation present. No thyroid mass present.  Cardiovascular: Normal rate, regular rhythm and normal heart sounds. Exam reveals no  gallop.  No murmur heard. Pulmonary/Chest: Effort normal and breath sounds normal. No stridor. No respiratory distress. She has no decreased breath sounds. She has no wheezes. She has no rhonchi. She has no rales.  Abdominal: Soft. Normal appearance and bowel sounds are normal. She exhibits distension. She exhibits no ascites. There is tenderness in the right lower quadrant. There is no rigidity, no rebound, no guarding and no CVA tenderness.    Musculoskeletal: Normal range of motion. She exhibits no edema or tenderness.  Neurological: She is alert and oriented to person, place, and time. She has normal strength. No cranial nerve deficit or sensory deficit. GCS eye subscore is 4. GCS verbal subscore is 5. GCS  motor subscore is 6.  Skin: Skin is warm and dry. No abrasion and no rash noted.  Psychiatric: She has a normal mood and affect. Her speech is normal and behavior is normal.  Nursing note and vitals reviewed.    ED Treatments / Results  Labs (all labs ordered are listed, but only abnormal results are displayed) Labs Reviewed  CBC WITH DIFFERENTIAL/PLATELET - Abnormal; Notable for the following components:      Result Value   WBC 12.1 (*)    Neutro Abs 8.3 (*)    Monocytes Absolute 1.5 (*)    All other components within normal limits  COMPREHENSIVE METABOLIC PANEL  URINALYSIS, ROUTINE W REFLEX MICROSCOPIC  I-STAT CG4 LACTIC ACID, ED    EKG  EKG Interpretation None       Radiology Dg Chest 2 View  Result Date: 07/26/2017 CLINICAL DATA:  Right lower quadrant pain EXAM: CHEST  2 VIEW COMPARISON:  05/18/2017 FINDINGS: Linear scarring at the left base. Right lung is clear. Heart is normal size. No effusions or acute bony abnormality. IMPRESSION: No active cardiopulmonary disease. Electronically Signed   By: Rolm Baptise M.D.   On: 07/26/2017 09:38    Procedures Procedures (including critical care time)  Medications Ordered in ED Medications  fentaNYL (SUBLIMAZE)  injection 50 mcg (not administered)     Initial Impression / Assessment and Plan / ED Course  I have reviewed the triage vital signs and the nursing notes.  Pertinent labs & imaging results that were available during my care of the patient were reviewed by me and considered in my medical decision making (see chart for details).     Patient with negative urinalysis here.  Chest x-ray without infiltrate.  Patient denies any URI symptoms.  Mild leukocytosis with white count 12.1.  Abdominal CT shows ascites and moderate amount of stool but no evidence of renal colic or appendicitis.  Repeat exam patient's pain is pinpoint to her right anterior lateral abdomen.  She has no peritoneal signs.  Does not appear to have spontaneous bacterial peritonitis.  Patient's abdominal pain is treated.  Suspect from constipation.  Does not appear to have a surgical process.  Will discharge home and patient was close and will return if her symptoms change.  Strict return precautions given  Final Clinical Impressions(s) / ED Diagnoses   Final diagnoses:  None    ED Discharge Orders    None       Lacretia Leigh, MD 07/26/17 1354

## 2017-07-27 ENCOUNTER — Ambulatory Visit (INDEPENDENT_AMBULATORY_CARE_PROVIDER_SITE_OTHER): Payer: 59 | Admitting: Licensed Clinical Social Worker

## 2017-07-27 DIAGNOSIS — F1021 Alcohol dependence, in remission: Secondary | ICD-10-CM | POA: Diagnosis not present

## 2017-07-27 DIAGNOSIS — F411 Generalized anxiety disorder: Secondary | ICD-10-CM | POA: Diagnosis not present

## 2017-07-29 ENCOUNTER — Encounter (HOSPITAL_COMMUNITY): Payer: Self-pay | Admitting: Licensed Clinical Social Worker

## 2017-07-29 NOTE — Progress Notes (Signed)
  Weekly Group Progress Note  Program: OUTPATIENT SKILLS GROUP  Group Time: 5:30-6:30pm  Participation Level: Active  Behavioral Response: Appropriate and Sharing  Type of Therapy:  Psycho-education Group  Skills discussed: Conflict Resolution Techniques   Summary of Progress: Pt continues to report being sober but not attending [place] meetings regularly or engaging in any recovery related activities. She continues to worry excessively about her son's "party lifestyle" and admits she has had consecutive nightmares that he is dead which are distressing her. Pt admits this is irrational but also states it is difficult to control the worry.    Summary of Group: Pts were active and engaged in session. One new member was present and group expectations were reviewed. Pts discussed their mental health symptoms and were taught steps to conflict resolution.   Archie Balboa, LPCA, LCASA

## 2017-08-03 ENCOUNTER — Ambulatory Visit (INDEPENDENT_AMBULATORY_CARE_PROVIDER_SITE_OTHER): Payer: 59 | Admitting: Licensed Clinical Social Worker

## 2017-08-03 DIAGNOSIS — F1021 Alcohol dependence, in remission: Secondary | ICD-10-CM | POA: Diagnosis not present

## 2017-08-03 DIAGNOSIS — F411 Generalized anxiety disorder: Secondary | ICD-10-CM | POA: Diagnosis not present

## 2017-08-05 ENCOUNTER — Encounter (HOSPITAL_COMMUNITY): Payer: Self-pay | Admitting: Licensed Clinical Social Worker

## 2017-08-05 NOTE — Progress Notes (Signed)
  Weekly Group Progress Note  Program: OUTPATIENT SKILLS GROUP  Group Time: 5:30-6:30pm  Participation Level: Active  Behavioral Response: Appropriate and Sharing  Type of Therapy:  Psycho-education Group  Skills discussed: Meditation   Summary of Progress: Pt continues to report she is sober and not experiencing cravings or overwhelming desire to drink alcohol or use drugs. She continues to report she isolates and is struggling to maintain her social support outside of group therapy. Pt stated she was a 8 on a 10 point scale w/ 10 being "feeling most positive".  Summary of Group: Counselor led pts in brief process session in which pts discussed their previous week and how their SUD, Anxiety, and depression had impacted them. Counselor showed brief video clip on mindfulness and led pts in a 15 min exercise of mindful breathing and increasing gratitude.   Archie Balboa, LPCA, LCASA

## 2017-08-10 ENCOUNTER — Ambulatory Visit (INDEPENDENT_AMBULATORY_CARE_PROVIDER_SITE_OTHER): Payer: 59 | Admitting: Licensed Clinical Social Worker

## 2017-08-10 ENCOUNTER — Encounter (HOSPITAL_COMMUNITY): Payer: Self-pay | Admitting: Licensed Clinical Social Worker

## 2017-08-10 DIAGNOSIS — F1021 Alcohol dependence, in remission: Secondary | ICD-10-CM

## 2017-08-10 DIAGNOSIS — F411 Generalized anxiety disorder: Secondary | ICD-10-CM | POA: Diagnosis not present

## 2017-08-10 NOTE — Progress Notes (Signed)
  Weekly Group Progress Note  Program: OUTPATIENT SKILLS GROUP  Group Time: 5:30-6:30pm  Participation Level: Active  Behavioral Response: Appropriate, Sharing and Agitated  Type of Therapy:  Psycho-education Group  Skills discussed: Challenging Cognitive Distortions   Summary of Progress: Pt reports she continues to stay sober from all mind-altering chemicals though admits her relationship w/ her husband is "not going well" since he is accusing her of cheating on him and they are having difficulties establishing emotional and physical intimacy since her d/c from [facility].   Summary of Group: Pts were active and engaged in brief process session in which pts shared about their weeks and utilization of coping skills.  Pts were active and engaged in significant psychoeducation session in which counselor led pts through a handout on "cognitive distortions"   Archie Balboa, LPCA, [place]

## 2017-08-12 ENCOUNTER — Ambulatory Visit: Payer: Self-pay | Admitting: *Deleted

## 2017-08-12 NOTE — Telephone Encounter (Signed)
Patient phoned 3 episodes of vomiting since early this morning. She has been able to keep down vitamin water earlier this afternoon. Gave advice from vomiting protocol. She will go to emergency room if symptoms worsen after hours. Reason for Disposition . [1] Constant abdominal pain AND [2] present > 2 hours  Answer Assessment - Initial Assessment Questions 1. VOMITING SEVERITY: "How many times have you vomited in the past 24 hours?"     - MILD:  1 - 2 times/day    - MODERATE: 3 - 5 times/day, decreased oral intake without significant weight loss or symptoms of dehydration    - SEVERE: 6 or more times/day, vomits everything or nearly everything, with significant weight loss, symptoms of dehydration      3 times vomiting today 2. ONSET: "When did the vomiting begin?"     Stated today when I woke up. 3. FLUIDS: "What fluids or food have you vomited up today?" "Have you been able to keep any fluids down?"     Vomited water today but kept down vitamin water after that. 4. ABDOMINAL PAIN: "Are your having any abdominal pain?" If yes : "How bad is it and what does it feel like?" (e.g., crampy, dull, intermittent, constant)      Constant pain in stomach is a 6, also intermittent cramping in lower mid abdomen. 5. DIARRHEA: "Is there any diarrhea?" If so, ask: "How many times today?"      One BM today and it was solid 6. CONTACTS: "Is there anyone else in the family with the same symptoms?"      no 7. CAUSE: "What do you think is causing your vomiting?"    Does not know. 8. HYDRATION STATUS: "Any signs of dehydration?" (e.g., dry mouth [not only dry lips], too weak to stand) "When did you last urinate?"     Dry lips today 9. OTHER SYMPTOMS: "Do you have any other symptoms?" (e.g., fever, headache, vertigo, vomiting blood or coffee grounds, recent head injury)     Temp100.8 oral at 2:30 today, 99.9 oral this morning 10. PREGNANCY: "Is there any chance you are pregnant?" "When was your last menstrual  period?"       no  Protocols used: [facility]

## 2017-08-17 ENCOUNTER — Other Ambulatory Visit: Payer: Self-pay

## 2017-08-17 ENCOUNTER — Ambulatory Visit (HOSPITAL_COMMUNITY): Payer: Self-pay | Admitting: Licensed Clinical Social Worker

## 2017-08-21 ENCOUNTER — Other Ambulatory Visit: Payer: Self-pay | Admitting: Internal Medicine

## 2017-08-24 ENCOUNTER — Ambulatory Visit (HOSPITAL_COMMUNITY): Payer: Self-pay | Admitting: Licensed Clinical Social Worker

## 2017-08-29 ENCOUNTER — Ambulatory Visit: Payer: Managed Care, Other (non HMO) | Admitting: Internal Medicine

## 2017-08-29 ENCOUNTER — Other Ambulatory Visit (INDEPENDENT_AMBULATORY_CARE_PROVIDER_SITE_OTHER): Payer: Managed Care, Other (non HMO)

## 2017-08-29 ENCOUNTER — Encounter: Payer: Self-pay | Admitting: Internal Medicine

## 2017-08-29 ENCOUNTER — Encounter (INDEPENDENT_AMBULATORY_CARE_PROVIDER_SITE_OTHER): Payer: Self-pay

## 2017-08-29 VITALS — BP 100/66 | HR 60 | Ht 66.0 in | Wt 118.5 lb

## 2017-08-29 DIAGNOSIS — Z1211 Encounter for screening for malignant neoplasm of colon: Secondary | ICD-10-CM

## 2017-08-29 DIAGNOSIS — K7030 Alcoholic cirrhosis of liver without ascites: Secondary | ICD-10-CM | POA: Diagnosis not present

## 2017-08-29 DIAGNOSIS — K7290 Hepatic failure, unspecified without coma: Secondary | ICD-10-CM

## 2017-08-29 DIAGNOSIS — K7682 Hepatic encephalopathy: Secondary | ICD-10-CM

## 2017-08-29 DIAGNOSIS — K7031 Alcoholic cirrhosis of liver with ascites: Secondary | ICD-10-CM

## 2017-08-29 LAB — CBC WITH DIFFERENTIAL/PLATELET
BASOS ABS: 0.1 10*3/uL (ref 0.0–0.1)
Basophils Relative: 0.9 % (ref 0.0–3.0)
EOS PCT: 1 % (ref 0.0–5.0)
Eosinophils Absolute: 0.1 10*3/uL (ref 0.0–0.7)
HCT: 45.2 % (ref 36.0–46.0)
Hemoglobin: 15.3 g/dL — ABNORMAL HIGH (ref 12.0–15.0)
LYMPHS ABS: 2.8 10*3/uL (ref 0.7–4.0)
Lymphocytes Relative: 32.1 % (ref 12.0–46.0)
MCHC: 33.9 g/dL (ref 30.0–36.0)
MCV: 91.8 fl (ref 78.0–100.0)
MONOS PCT: 10.9 % (ref 3.0–12.0)
Monocytes Absolute: 0.9 10*3/uL (ref 0.1–1.0)
NEUTROS PCT: 55.1 % (ref 43.0–77.0)
Neutro Abs: 4.8 10*3/uL (ref 1.4–7.7)
Platelets: 282 10*3/uL (ref 150.0–400.0)
RBC: 4.93 Mil/uL (ref 3.87–5.11)
RDW: 13.7 % (ref 11.5–15.5)
WBC: 8.7 10*3/uL (ref 4.0–10.5)

## 2017-08-29 LAB — COMPREHENSIVE METABOLIC PANEL
ALT: 18 U/L (ref 0–35)
AST: 32 U/L (ref 0–37)
Albumin: 4.6 g/dL (ref 3.5–5.2)
Alkaline Phosphatase: 118 U/L — ABNORMAL HIGH (ref 39–117)
BILIRUBIN TOTAL: 1.2 mg/dL (ref 0.2–1.2)
BUN: 12 mg/dL (ref 6–23)
CO2: 27 meq/L (ref 19–32)
Calcium: 11.4 mg/dL — ABNORMAL HIGH (ref 8.4–10.5)
Chloride: 94 mEq/L — ABNORMAL LOW (ref 96–112)
Creatinine, Ser: 1.1 mg/dL (ref 0.40–1.20)
GFR: 54.75 mL/min — AB (ref >60.00)
Glucose, Bld: 104 mg/dL — ABNORMAL HIGH (ref 70–99)
Potassium: 4.1 mEq/L (ref 3.5–5.1)
SODIUM: 130 meq/L — AB (ref 135–145)
Total Protein: 9.1 g/dL — ABNORMAL HIGH (ref 6.0–8.3)

## 2017-08-29 LAB — PROTIME-INR
INR: 1.2 ratio — AB (ref 0.8–1.0)
PROTHROMBIN TIME: 13.3 s — AB (ref 9.6–13.1)

## 2017-08-29 MED ORDER — SUPREP BOWEL PREP KIT 17.5-3.13-1.6 GM/177ML PO SOLN: 1.0000 | ORAL | 0 refills | Status: DC

## 2017-08-29 NOTE — Progress Notes (Signed)
Subjective:    Patient ID: Mercedes Mitchell, female    DOB: [birth year], 57 y.o.   MRN: [id number]  HPI Mercedes Mitchell is a 57 year old female with a history of decompensated alcoholic cirrhosis, history of alcoholic hepatitis now off of steroid therapy, history of hepatic encephalopathy, portal hypertensive gastropathy, erosive esophagitis, right lower extremity DVT who is here for follow-up.  She was last seen on 05/02/2017.  She is here alone today.  She reports since last being seen here she developed right middle abdominal pain associated with fever and went to the ER on 07/26/2017.  This workup included an abdominal CT scan which was largely unremarkable what was suggestive of constipation.  She reports that her fever and pain lasted a couple of days and went away and has not returned.  Recently she reports feeling well.  She is having days where she will develop diarrhea which she describes as a "upset stomach".  This is not associated with abdominal pain, nausea, or vomiting.  She has not had further fevers.  She did have a 24-hour GI "bug" just before Christmas with nausea and vomiting but this resolved.  No sick contacts that were known at that time.  She has continued her lactulose 30 mL's 3 times a day but admits to being at times inconsistent with the dose.  She has remained completely alcohol abstinent and reports she has no desire to drink again.  She is attending [place] meetings.  She is using Lasix 60 mg daily and spironolactone 200 mg daily.  She is taking folic acid every day, a multivitamin and her pantoprazole 40 mg twice daily.  Her PPI therapy has been in place since she had severe esophagitis in May 2018.  She denies confusion, cloudy thinking and sleepiness.  No bleeding.  Denies jaundice.  Reports energy levels as "good".  No itching.  No lower extremity edema.  Appetite has been increasing and she has been able to gain some weight which she is happy about.  Weight today is 118.5 pounds,  and weight at last office visit was 113 pounds.   Review of Systems As per HPI, otherwise negative  Current Medications, Allergies, Past Medical History, Past Surgical History, Family History and Social History were reviewed in [employer] record.     Objective:   Physical Exam BP 100/66   Pulse 60   Ht 5\' 6"  (1.676 m)   Wt 118 lb 8 oz (53.8 kg)   BMI 19.13 kg/m  Constitutional: Well-developed, chronically ill-appearing female in no acute distress HEENT: Normocephalic and atraumatic. Oropharynx is clear and moist. Conjunctivae are normal.  No scleral icterus. Neck: Neck supple. Trachea midline. Cardiovascular: Normal rate, regular rhythm and intact distal pulses. No M/R/G Pulmonary/chest: Effort normal and breath sounds normal. No wheezing, rales or rhonchi. Abdominal: Soft, nontender, nondistended. Bowel sounds active throughout. There are no masses palpable.  Extremities: no clubbing, cyanosis, or edema Neurological: Alert and oriented to person place and time.  No asterixis Skin: Skin is warm and dry. Psychiatric: Normal mood and affect. Behavior is normal.  CBC    Component Value Date/Time   WBC 12.1 (H) 07/26/2017 0945   RBC 4.59 07/26/2017 0945   HGB 14.3 07/26/2017 0945   HCT 41.5 07/26/2017 0945   HCT 23.3 (L) 12/25/2016 0654   PLT 243 07/26/2017 0945   MCV 90.4 07/26/2017 0945   MCH 31.2 07/26/2017 0945   MCHC 34.5 07/26/2017 0945   RDW 13.7 07/26/2017 0945  LYMPHSABS 2.3 07/26/2017 0945   MONOABS 1.5 (H) 07/26/2017 0945   EOSABS 0.1 07/26/2017 0945   BASOSABS 0.0 07/26/2017 0945   CMP     Component Value Date/Time   NA 135 07/26/2017 0945   NA 130 (A) 05/12/2017   K 4.0 07/26/2017 0945   CL 100 (L) 07/26/2017 0945   CO2 26 07/26/2017 0945   GLUCOSE 118 (H) 07/26/2017 0945   BUN 15 07/26/2017 0945   BUN 10 05/12/2017   CREATININE 1.01 (H) 07/26/2017 0945   CREATININE 0.91 05/12/2017 1637   CALCIUM 11.0 (H) 07/26/2017 0945    PROT 8.2 (H) 07/26/2017 0945   ALBUMIN 4.1 07/26/2017 0945   AST 41 07/26/2017 0945   ALT 20 07/26/2017 0945   ALKPHOS 114 07/26/2017 0945   BILITOT 1.4 (H) 07/26/2017 0945   GFRNONAA >60 07/26/2017 0945   GFRAA >60 07/26/2017 0945   Lab Results  Component Value Date   INR 1.3 (H) 06/24/2017   INR 1.5 (H) 05/18/2017   INR 1.3 (H) 05/02/2017   CT ABDOMEN AND PELVIS WITH CONTRAST   TECHNIQUE: Multidetector CT imaging of the abdomen and pelvis was performed using the standard protocol following bolus administration of intravenous contrast.   CONTRAST:  161mL ISOVUE-300 IOPAMIDOL (ISOVUE-300) INJECTION 61%   COMPARISON:  CT abdomen and pelvis of 05/06/2017   FINDINGS: Lower chest: Mild linear atelectasis or scarring is noted posteriorly in both lung bases. No pneumonia or effusion is seen. The heart is within normal limits in size.   Hepatobiliary: The liver somewhat small perhaps slightly irregular in outline with a small amount of perihepatic ascites, findings consistent with cirrhosis. No focal hepatic lesion is noted. The gallbladder is slightly distended but no gallstones are evident.   Pancreas: The pancreas is normal in size and the pancreatic duct is not dilated.   Spleen: The spleen is unchanged.   Adrenals/Urinary Tract: The adrenal glands appear normal. The kidneys enhance with no calculus or mass. On delayed images, the pelvocaliceal systems are unremarkable. No hydronephrosis is seen. The ureters appear to be normal in caliber. The urinary bladder is fairly decompressed and difficult to assess. No gross abnormality is evident.   Stomach/Bowel: The stomach is not well distended but no abnormality is noted. No small bowel distention is seen. There are scattered rectosigmoid colon diverticula present. No diverticulitis is noted. There is feces throughout the colon. The terminal ileum and the appendix are unremarkable   Vascular/Lymphatic: The abdominal  aorta is normal in caliber. No adenopathy is seen.   Reproductive: The uterus is normal in size. No adnexal lesion is seen. A tiny amount of fluid layers dependently within the pelvis possibly representing ascites.   Other: None   Musculoskeletal: The lumbar vertebrae are in normal alignment with normal intervertebral disc spaces. The hips are unremarkable.   IMPRESSION: 1. No explanation for the patient's pain is seen. There are changes of cirrhosis with a small amount of ascites noted. 2. The appendix and terminal ileum are unremarkable. 3. Moderate amount of feces throughout the colon.     Electronically Signed   By: Ivar Drape M.D.   On: 07/26/2017 13:35       Assessment & Plan:   57 year old female with a history of decompensated alcoholic cirrhosis, history of alcoholic hepatitis now off of steroid therapy, history of hepatic encephalopathy, portal hypertensive gastropathy, erosive esophagitis, right lower extremity DVT who is here for follow-up.  1.  Alcoholic cirrhosis --overall she has made significant  improvement and she has maintained alcohol abstinence and her relapse prevention.  She is congratulated on her alcohol abstinence and encouraged to continue. --Ascites --improved with small amount of ascites noted on CT scan 1 month ago.  For this reason we will continue the Lasix 60 mg daily (she decreased the dose from 80 to 60 mg for unclear reasons but given stability we will continue current dose) and spironolactone 200 mg daily.  Check CBC, CMP and INR today.  Importance of low-sodium diet reviewed today. --Hepatic encephalopathy --under good control with lactulose, continue lactulose 3 times a day, titrate as below. --[place] screening -- up-to-date after recent CT scan with IV contrast.  I personally reviewed the CT abd/pelvis today.  Repeat ultrasound in 6 months --Continue to follow with [facility] --Alcoholic hepatitis --resolved and she has weaned  off steroids completely --Variceal screening --endoscopy recommended.  We discussed the risk, benefits and alternatives and she is agreeable to proceed.  2.  CRC screening --she is due for screening colonoscopy, never before screen.  Colonoscopy recommended.  We discussed the risk, benefits and alternatives and she is agreeable to proceed  3.  Alternating diarrhea and constipation/elevated calcium--likely a lactulose effect, and I wonder if some of her diarrhea is not because she is becoming constipated.  I instructed her to be more consistent with lactulose 20 g 3 times daily.  If she is having diarrhea she can skip remaining lactulose doses on that day.  She is also had elevated calcium levels on her last 2 metabolic panels.  Repeat calcium level today and check PTH  4.  History of abnormal pancreas by CT --we were planning repeat CT in May to follow-up an abnormal pancreas seen in May 2018 however her pancreas was normal on a recent CT scan and therefore surveillance CT for this reason is not needed.  64-month follow-up, sooner if needed

## 2017-08-29 NOTE — Patient Instructions (Addendum)
You have been scheduled for an endoscopy and colonoscopy. Please follow the written instructions given to you at your visit today. Please pick up your prep supplies at the pharmacy within the next 1-3 days. If you use inhalers (even only as needed), please bring them with you on the day of your procedure. Your physician has requested that you go to www.startemmi.com and enter the access code given to you at your visit today. This web site gives a general overview about your procedure. However, you should still follow specific instructions given to you by our office regarding your preparation for the procedure.  Your physician has requested that you go to the basement for the following lab work before leaving today: CBC, CMP, INR, PTH  Take Lactulose 30 ml three times daily  Continue folic acid, pantoprazole, aldactone 100 mg twice daily, lasix 60 mg daily.  You will be due for ultrasound of the abdomen for [facility] screening June 2019. We will contact you when it gets closer to that time.  Please follow up with Dr Hilarie Fredrickson in 6 months.  If you are age 56 or older, your body mass index should be between 23-30. Your Body mass index is 19.13 kg/m. If this is out of the aforementioned range listed, please consider follow up with your Primary Care Provider.  If you are age 52 or younger, your body mass index should be between 19-25. Your Body mass index is 19.13 kg/m. If this is out of the aformentioned range listed, please consider follow up with your Primary Care Provider.

## 2017-08-30 ENCOUNTER — Telehealth: Payer: Self-pay | Admitting: *Deleted

## 2017-08-30 DIAGNOSIS — R7989 Other specified abnormal findings of blood chemistry: Secondary | ICD-10-CM

## 2017-08-30 LAB — PARATHYROID HORMONE, INTACT (NO CA): PTH: 3 pg/mL — ABNORMAL LOW (ref 14–64)

## 2017-08-30 NOTE — Telephone Encounter (Signed)
I have spoken to patient to advise of recent lab results as well as Dr Vena Rua recommendations. (Of note: Dr Hilarie Fredrickson has also given verbal orders for patient to have TSH and Vitamin A levels drawn in addition to other tests ordered). Lab orders have been placed in Epic. Patient is advised that she should remain on a low sodium diet. Also advised she should decrease furosemide to 40 mg daily and aldactone to 100 mg once daily due to recent increase in creatinine and decrease sodium level. She will come for labs tomorrow and will also come next week for BMP.

## 2017-08-30 NOTE — Telephone Encounter (Signed)
-----   Message from Jerene Bears, MD sent at 08/30/2017  1:36 PM EST ----- Patient's calcium remains high and her PTH is low thus this is non-parathyroid hypercalcemia Need to evaluate further with PTH related peptide and checking vitamin D levels; please proceed with this testing; may also need referral to endocrinology for persistent hypercalcemia but await further testing Sodium is low and creatinine a bit above her baseline so I would reduce Lasix to 40 mg daily and spironolactone to 100 mg daily Low-sodium diet remains very important INR is stable Blood counts look good Repeat BMP in 1 week after diuretic change

## 2017-08-31 ENCOUNTER — Other Ambulatory Visit (INDEPENDENT_AMBULATORY_CARE_PROVIDER_SITE_OTHER): Payer: Managed Care, Other (non HMO)

## 2017-08-31 ENCOUNTER — Ambulatory Visit (HOSPITAL_COMMUNITY): Payer: Self-pay | Admitting: Licensed Clinical Social Worker

## 2017-08-31 DIAGNOSIS — R7989 Other specified abnormal findings of blood chemistry: Secondary | ICD-10-CM | POA: Diagnosis not present

## 2017-08-31 LAB — TSH: TSH: 1.77 u[IU]/mL (ref 0.35–4.50)

## 2017-08-31 LAB — VITAMIN D 25 HYDROXY (VIT D DEFICIENCY, FRACTURES): VITD: 38.5 ng/mL (ref 30.00–100.00)

## 2017-09-01 ENCOUNTER — Encounter: Payer: Self-pay | Admitting: Internal Medicine

## 2017-09-05 LAB — VITAMIN D 1,25 DIHYDROXY
Vitamin D 1, 25 (OH)2 Total: <8 pg/mL — ABNORMAL LOW (ref 18–72)
Vitamin D2 1, 25 (OH)2: <8 pg/mL
Vitamin D3 1, 25 (OH)2: <8 pg/mL

## 2017-09-05 LAB — PTH-RELATED PEPTIDE: PTH-RELATED PROTEIN (PTH-RP): 18 pg/mL (ref 14–27)

## 2017-09-05 LAB — VITAMIN A: VITAMIN A (RETINOIC ACID): 58 ug/dL (ref 38–98)

## 2017-09-06 ENCOUNTER — Telehealth: Payer: Self-pay | Admitting: Internal Medicine

## 2017-09-06 NOTE — Telephone Encounter (Signed)
Patient wanting results from labs done on 1.9.19 and wanting to know if she still needs to come in early tomorrow for some testing before her procedure.

## 2017-09-06 NOTE — Telephone Encounter (Signed)
See result note.  

## 2017-09-06 NOTE — Telephone Encounter (Signed)
Patient calling for lab results done 08/31/17, please advise.

## 2017-09-07 ENCOUNTER — Other Ambulatory Visit: Payer: Self-pay

## 2017-09-07 ENCOUNTER — Other Ambulatory Visit (INDEPENDENT_AMBULATORY_CARE_PROVIDER_SITE_OTHER): Payer: Managed Care, Other (non HMO)

## 2017-09-07 ENCOUNTER — Ambulatory Visit (AMBULATORY_SURGERY_CENTER): Payer: Managed Care, Other (non HMO) | Admitting: Internal Medicine

## 2017-09-07 ENCOUNTER — Encounter: Payer: Self-pay | Admitting: Internal Medicine

## 2017-09-07 ENCOUNTER — Ambulatory Visit (HOSPITAL_COMMUNITY): Payer: Self-pay | Admitting: Licensed Clinical Social Worker

## 2017-09-07 VITALS — BP 137/89 | HR 94 | Temp 97.8°F | Resp 18 | Ht 66.0 in | Wt 118.0 lb

## 2017-09-07 DIAGNOSIS — D124 Benign neoplasm of descending colon: Secondary | ICD-10-CM

## 2017-09-07 DIAGNOSIS — R7989 Other specified abnormal findings of blood chemistry: Secondary | ICD-10-CM

## 2017-09-07 DIAGNOSIS — Z1211 Encounter for screening for malignant neoplasm of colon: Secondary | ICD-10-CM | POA: Diagnosis not present

## 2017-09-07 DIAGNOSIS — K7031 Alcoholic cirrhosis of liver with ascites: Secondary | ICD-10-CM

## 2017-09-07 DIAGNOSIS — K635 Polyp of colon: Secondary | ICD-10-CM | POA: Diagnosis not present

## 2017-09-07 DIAGNOSIS — K7030 Alcoholic cirrhosis of liver without ascites: Secondary | ICD-10-CM

## 2017-09-07 DIAGNOSIS — Z1212 Encounter for screening for malignant neoplasm of rectum: Secondary | ICD-10-CM

## 2017-09-07 LAB — HEPATIC FUNCTION PANEL
ALBUMIN: 4.3 g/dL (ref 3.5–5.2)
ALT: 17 U/L (ref 0–35)
AST: 32 U/L (ref 0–37)
Alkaline Phosphatase: 103 U/L (ref 39–117)
BILIRUBIN DIRECT: 0.4 mg/dL — AB (ref 0.0–0.3)
TOTAL PROTEIN: 8.6 g/dL — AB (ref 6.0–8.3)
Total Bilirubin: 1.3 mg/dL — ABNORMAL HIGH (ref 0.2–1.2)

## 2017-09-07 LAB — CBC WITH DIFFERENTIAL/PLATELET
BASOS ABS: 0.1 10*3/uL (ref 0.0–0.1)
BASOS PCT: 0.7 % (ref 0.0–3.0)
EOS ABS: 0.1 10*3/uL (ref 0.0–0.7)
Eosinophils Relative: 0.6 % (ref 0.0–5.0)
HEMATOCRIT: 43.7 % (ref 36.0–46.0)
Hemoglobin: 14.6 g/dL (ref 12.0–15.0)
LYMPHS PCT: 31.9 % (ref 12.0–46.0)
Lymphs Abs: 2.7 10*3/uL (ref 0.7–4.0)
MCHC: 33.5 g/dL (ref 30.0–36.0)
MCV: 92.6 fl (ref 78.0–100.0)
MONO ABS: 0.9 10*3/uL (ref 0.1–1.0)
Monocytes Relative: 10.7 % (ref 3.0–12.0)
NEUTROS ABS: 4.8 10*3/uL (ref 1.4–7.7)
NEUTROS PCT: 56.1 % (ref 43.0–77.0)
PLATELETS: 238 10*3/uL (ref 150.0–400.0)
RBC: 4.71 Mil/uL (ref 3.87–5.11)
RDW: 14 % (ref 11.5–15.5)
WBC: 8.5 10*3/uL (ref 4.0–10.5)

## 2017-09-07 LAB — COMPREHENSIVE METABOLIC PANEL
ALT: 17 U/L (ref 0–35)
AST: 32 U/L (ref 0–37)
Albumin: 4.3 g/dL (ref 3.5–5.2)
Alkaline Phosphatase: 103 U/L (ref 39–117)
BILIRUBIN TOTAL: 1.3 mg/dL — AB (ref 0.2–1.2)
BUN: 9 mg/dL (ref 6–23)
CALCIUM: 10.4 mg/dL (ref 8.4–10.5)
CHLORIDE: 97 meq/L (ref 96–112)
CO2: 27 meq/L (ref 19–32)
CREATININE: 1.19 mg/dL (ref 0.40–1.20)
GFR: 49.99 mL/min — ABNORMAL LOW (ref >60.00)
Glucose, Bld: 95 mg/dL (ref 70–99)
Potassium: 3.9 mEq/L (ref 3.5–5.1)
Sodium: 136 mEq/L (ref 135–145)
Total Protein: 8.6 g/dL — ABNORMAL HIGH (ref 6.0–8.3)

## 2017-09-07 LAB — BASIC METABOLIC PANEL
BUN: 9 mg/dL (ref 6–23)
CALCIUM: 10.4 mg/dL (ref 8.4–10.5)
CO2: 27 mEq/L (ref 19–32)
Chloride: 97 mEq/L (ref 96–112)
Creatinine, Ser: 1.19 mg/dL (ref 0.40–1.20)
GFR: 49.99 mL/min — AB (ref >60.00)
GLUCOSE: 95 mg/dL (ref 70–99)
POTASSIUM: 3.9 meq/L (ref 3.5–5.1)
SODIUM: 136 meq/L (ref 135–145)

## 2017-09-07 LAB — PROTIME-INR
INR: 1.3 ratio — AB (ref 0.8–1.0)
Prothrombin Time: 13.6 s — ABNORMAL HIGH (ref 9.6–13.1)

## 2017-09-07 MED ORDER — SODIUM CHLORIDE 0.9 % IV SOLN: 500.0000 mL | Freq: Once | INTRAVENOUS | Status: DC

## 2017-09-07 MED ORDER — NADOLOL 20 MG PO TABS: 20.0000 mg | ORAL_TABLET | Freq: Every day | ORAL | 5 refills | Status: DC

## 2017-09-07 NOTE — Progress Notes (Signed)
A and O x3. Report to RN. Tolerated MAC anesthesia well.Teeth unchanged after procedure.

## 2017-09-07 NOTE — Telephone Encounter (Signed)
Yes

## 2017-09-07 NOTE — Op Note (Signed)
[place] Patient Name: Mercedes Mitchell Procedure Date: 09/07/2017 2:57 PM MRN: [id number] Endoscopist: Jerene Bears , MD Age: 57 Referring MD:  Date of Birth: 13-Dec-1960 Gender: Female Account #: [id number] Procedure:                Upper GI endoscopy Indications:              Cirrhosis rule out esophageal varices, history of                            esophagitis Medicines:                Monitored Anesthesia Care Procedure:                Pre-Anesthesia Assessment:                           - Prior to the procedure, a History and Physical                            was performed, and patient medications and                            allergies were reviewed. The patient's tolerance of                            previous anesthesia was also reviewed. The risks                            and benefits of the procedure and the sedation                            options and risks were discussed with the patient.                            All questions were answered, and informed consent                            was obtained. Prior Anticoagulants: The patient has                            taken no previous anticoagulant or antiplatelet                            agents. ASA Grade Assessment: III - A patient with                            severe systemic disease. After reviewing the risks                            and benefits, the patient was deemed in                            satisfactory condition to undergo the procedure.  After obtaining informed consent, the endoscope was                            passed under direct vision. Throughout the                            procedure, the patient's blood pressure, pulse, and                            oxygen saturations were monitored continuously. The                            Endoscope was introduced through the mouth, and                            advanced to the second part of duodenum. The  upper                            GI endoscopy was accomplished without difficulty.                            The patient tolerated the procedure well. Scope In: Scope Out: Findings:                 Grade I varices were found in the lower third of                            the esophagus. They were small in size.                           The exam of the esophagus was otherwise normal.                           Moderate portal hypertensive gastropathy was found                            in the cardia, in the gastric fundus and in the                            gastric body.                           There is no endoscopic evidence of varices in the                            gastroesophageal junction, in the cardia and in the                            gastric fundus.                           The examined duodenum was normal. Complications:            No immediate complications. Estimated Blood Loss:     Estimated blood loss: none. Impression:               -  Grade I esophageal varices (small).                           - Portal hypertensive gastropathy.                           - Normal examined duodenum.                           - No specimens collected. Recommendation:           - Patient has a contact number available for                            emergencies. The signs and symptoms of potential                            delayed complications were discussed with the                            patient. Return to normal activities tomorrow.                            Written discharge instructions were provided to the                            patient.                           - Resume previous diet.                           - Continue present medications.                           - Add nadolol 20 mg daily for primary prophylaxis                            of bleeding.                           - No need for repeat EGD for variceal                             screening/surveillance after initiation of beta                            blocker therapy. Jerene Bears, MD 09/07/2017 3:58:26 PM This report has been signed electronically.

## 2017-09-07 NOTE — Patient Instructions (Signed)
YOU HAD AN ENDOSCOPIC PROCEDURE TODAY AT [facility]:   Refer to the procedure report that was given to you for any specific questions about what was found during the examination.  If the procedure report does not answer your questions, please call your gastroenterologist to clarify.  If you requested that your care partner not be given the details of your procedure findings, then the procedure report has been included in a sealed envelope for you to review at your convenience later.  YOU SHOULD EXPECT: Some feelings of bloating in the abdomen. Passage of more gas than usual.  Walking can help get rid of the air that was put into your GI tract during the procedure and reduce the bloating. If you had a lower endoscopy (such as a colonoscopy or flexible sigmoidoscopy) you may notice spotting of blood in your stool or on the toilet paper. If you underwent a bowel prep for your procedure, you may not have a normal bowel movement for a few days.  Please Note:  You might notice some irritation and congestion in your nose or some drainage.  This is from the oxygen used during your procedure.  There is no need for concern and it should clear up in a day or so.  SYMPTOMS TO REPORT IMMEDIATELY:   Following lower endoscopy (colonoscopy or flexible sigmoidoscopy):  Excessive amounts of blood in the stool  Significant tenderness or worsening of abdominal pains  Swelling of the abdomen that is new, acute  Fever of 100F or higher   Following upper endoscopy (EGD)  Vomiting of blood or coffee ground material  New chest pain or pain under the shoulder blades  Painful or persistently difficult swallowing  New shortness of breath  Fever of 100F or higher  Black, tarry-looking stools  For urgent or emergent issues, a gastroenterologist can be reached at any hour by calling [phone].   DIET:  We do recommend a small meal at first, but then you may proceed to your regular diet.  Drink  plenty of fluids but you should avoid alcoholic beverages for 24 hours.  ACTIVITY:  You should plan to take it easy for the rest of today and you should NOT DRIVE or use heavy machinery until tomorrow (because of the sedation medicines used during the test).    FOLLOW UP: Our staff will call the number listed on your records the next business day following your procedure to check on you and address any questions or concerns that you may have regarding the information given to you following your procedure. If we do not reach you, we will leave a message.  However, if you are feeling well and you are not experiencing any problems, there is no need to return our call.  We will assume that you have returned to your regular daily activities without incident.  If any biopsies were taken you will be contacted by phone or by letter within the next 1-3 weeks.  Please call us at [phone] if you have not heard about the biopsies in 3 weeks.    SIGNATURES/CONFIDENTIALITY: You and/or your care partner have signed paperwork which will be entered into your electronic medical record.  These signatures attest to the fact that that the information above on your After Visit Summary has been reviewed and is understood.  Full responsibility of the confidentiality of this discharge information lies with you and/or your care-partner.  Nadolol 20mg . Daily as ordered  Polyp, diverticulosis and hemorrhoid information  given.

## 2017-09-07 NOTE — Op Note (Signed)
[place] Patient Name: Mercedes Mitchell Procedure Date: 09/07/2017 2:56 PM MRN: [id number] Endoscopist: Jerene Bears , MD Age: 57 Referring MD:  Date of Birth: 09-Apr-1961 Gender: Female Account #: [id number] Procedure:                Colonoscopy Indications:              Screening for colorectal malignant neoplasm, This                            is the patient's first colonoscopy Medicines:                Monitored Anesthesia Care Procedure:                Pre-Anesthesia Assessment:                           - Prior to the procedure, a History and Physical                            was performed, and patient medications and                            allergies were reviewed. The patient's tolerance of                            previous anesthesia was also reviewed. The risks                            and benefits of the procedure and the sedation                            options and risks were discussed with the patient.                            All questions were answered, and informed consent                            was obtained. Prior Anticoagulants: The patient has                            taken no previous anticoagulant or antiplatelet                            agents. ASA Grade Assessment: III - A patient with                            severe systemic disease. After reviewing the risks                            and benefits, the patient was deemed in                            satisfactory condition to undergo the procedure.  After obtaining informed consent, the colonoscope                            was passed under direct vision. Throughout the                            procedure, the patient's blood pressure, pulse, and                            oxygen saturations were monitored continuously. The                            Model PCF-[id number] scope was introduced                            through the anus and  advanced to the the cecum,                            identified by appendiceal orifice and ileocecal                            valve. The colonoscopy was performed without                            difficulty. The patient tolerated the procedure                            well. The quality of the bowel preparation was                            good. The ileocecal valve, appendiceal orifice, and                            rectum were photographed. Scope In: 3:21:44 PM Scope Out: 3:47:29 PM Scope Withdrawal Time: 0 hours 9 minutes 26 seconds  Total Procedure Duration: 0 hours 25 minutes 45 seconds  Findings:                 The digital rectal exam was normal.                           A 6 mm polyp was found in the mid descending colon.                            The polyp was sessile. The polyp was removed with a                            cold snare. Resection and retrieval were complete.                           Multiple small and large-mouthed diverticula were                            found in the sigmoid colon, descending colon,  hepatic flexure and ascending colon.                           The exam was otherwise without abnormality.                           External hemorrhoids were found during retroflexion                            and during digital exam. The hemorrhoids were small. Complications:            No immediate complications. Estimated Blood Loss:     Estimated blood loss was minimal. Impression:               - One 6 mm polyp in the mid descending colon,                            removed with a cold snare. Resected and retrieved.                           - Mild diverticulosis in the sigmoid colon, in the                            descending colon, at the hepatic flexure and in the                            ascending colon.                           - The examination was otherwise normal.                           - External  hemorrhoids. Recommendation:           - Patient has a contact number available for                            emergencies. The signs and symptoms of potential                            delayed complications were discussed with the                            patient. Return to normal activities tomorrow.                            Written discharge instructions were provided to the                            patient.                           - Resume previous diet.                           - Continue present medications.                           -  Await pathology results.                           - Repeat colonoscopy is recommended. The                            colonoscopy date will be determined after pathology                            results from today's exam become available for                            review. Jerene Bears, MD 09/07/2017 4:03:48 PM This report has been signed electronically.

## 2017-09-07 NOTE — Progress Notes (Signed)
Called to room to assist during endoscopic procedure.  Patient ID and intended procedure confirmed with present staff. Received instructions for my participation in the procedure from the performing physician.  

## 2017-09-07 NOTE — Progress Notes (Signed)
Pt's states no medical or surgical changes since previsit or office visit. 

## 2017-09-07 NOTE — Telephone Encounter (Signed)
Dr. Hilarie Fredrickson will you notify pt of her lab results today when she comes in for her procedure? I will make referral to endocrinology and they will contact her regarding the appt.

## 2017-09-08 ENCOUNTER — Telehealth: Payer: Self-pay | Admitting: *Deleted

## 2017-09-08 NOTE — Telephone Encounter (Signed)
  Follow up Call-  Call back number 09/07/2017  Post procedure Call Back phone  # [phone]  Permission to leave phone message Yes  Some recent data might be hidden     Patient questions:  Do you have a fever, pain , or abdominal swelling? No. Pain Score  0 *  Have you tolerated food without any problems? Yes.    Have you been able to return to your normal activities? Yes.    Do you have any questions about your discharge instructions: Diet   No. Medications  No. Follow up visit  No.  Do you have questions or concerns about your Care? No.  Actions: * If pain score is 4 or above: No action needed, pain <4.

## 2017-09-08 NOTE — Telephone Encounter (Signed)
  Follow up Call-  Call back number 09/07/2017  Post procedure Call Back phone  # [phone]  Permission to leave phone message Yes  Some recent data might be hidden     Patient questions:  Do you have a fever, pain , or abdominal swelling? No. Pain Score  0 *  Have you tolerated food without any problems? Yes.    Have you been able to return to your normal activities? Yes.    Do you have any questions about your discharge instructions: Diet   No. Medications  No. Follow up visit  No.  Do you have questions or concerns about your Care? No.  Actions: * If pain score is 4 or above: No action needed, pain <4.

## 2017-09-09 LAB — KAPPA/LAMBDA LIGHT CHAINS
KAPPA FREE LGHT CHN: 49.1 mg/L — AB (ref 3.3–19.4)
Kappa:Lambda Ratio: 1.64 (ref 0.26–1.65)
LAMBDA FREE LGHT CHN: 29.9 mg/L — AB (ref 5.7–26.3)

## 2017-09-09 LAB — PROTEIN ELECTROPHORESIS, URINE REFLEX
ALPHA-1-GLOBULIN, U: 0 %
Albumin ELP, Urine: 0 %
Alpha-2-Globulin, U: 0 %
Beta Globulin, U: 0 %
Gamma Globulin, U: 0 %
Protein, Ur: 4.9 mg/dL

## 2017-09-09 LAB — PROTEIN ELECTROPHORESIS, SERUM
ALPHA 2: 0.8 g/dL (ref 0.5–0.9)
Albumin ELP: 4.2 g/dL (ref 3.8–4.8)
Alpha 1: 0.3 g/dL (ref 0.2–0.3)
BETA GLOBULIN: 0.5 g/dL (ref 0.4–0.6)
Beta 2: 0.5 g/dL (ref 0.2–0.5)
GAMMA GLOBULIN: 1.9 g/dL — AB (ref 0.8–1.7)
TOTAL PROTEIN: 8.1 g/dL (ref 6.1–8.1)

## 2017-09-12 ENCOUNTER — Other Ambulatory Visit: Payer: Self-pay

## 2017-09-13 ENCOUNTER — Encounter: Payer: Self-pay | Admitting: Oncology

## 2017-09-13 ENCOUNTER — Telehealth: Payer: Self-pay | Admitting: Oncology

## 2017-09-13 ENCOUNTER — Encounter: Payer: Self-pay | Admitting: Internal Medicine

## 2017-09-13 NOTE — Telephone Encounter (Signed)
Appt has been scheduled for the pt to see Dr. Alen Blew on 1/31 at 215pm. Pt aware to arrive 30 minutes early. Address verified. Letter mailed to the pt and referring office notified.

## 2017-09-14 ENCOUNTER — Ambulatory Visit (HOSPITAL_COMMUNITY): Payer: Self-pay | Admitting: Licensed Clinical Social Worker

## 2017-09-16 ENCOUNTER — Telehealth: Payer: Self-pay | Admitting: Family Medicine

## 2017-09-16 ENCOUNTER — Other Ambulatory Visit: Payer: Self-pay | Admitting: *Deleted

## 2017-09-16 NOTE — Telephone Encounter (Signed)
Requesting refill of Seroquel.  LOV 07/19/17 with Dr. Martinique  [facility] 04/27/17   #180/ 1 refill  [place], [place] [address]

## 2017-09-16 NOTE — Telephone Encounter (Signed)
Copied from [place] [id number]. Topic: Quick Communication - Rx Refill/Question >> Sep 16, 2017  1:38 PM Synthia Innocent wrote: Medication: QUEtiapine (SEROQUEL) 25 MG tablet    Has the patient contacted their pharmacy? yes   (Agent: If no, request that the patient contact the pharmacy for the refill.)   Preferred Pharmacy (with phone number or street name): [facility] [address]: Please be advised that RX refills may take up to 3 business days. We ask that you follow-up with your pharmacy.

## 2017-09-18 ENCOUNTER — Other Ambulatory Visit: Payer: Self-pay | Admitting: Family Medicine

## 2017-09-18 MED ORDER — QUETIAPINE FUMARATE 25 MG PO TABS: 25.0000 mg | ORAL_TABLET | Freq: Every day | ORAL | 0 refills | Status: DC

## 2017-09-18 NOTE — Telephone Encounter (Signed)
Rx for Seroquel 25 mg sent to her pharmacy. Thanks, BJ

## 2017-09-19 IMAGING — DX DG CHEST 1V PORT
1 series · 1 of 1 positions shown · non-contrast
Comparison: None.

CLINICAL DATA: Sepsis

EXAM:
PORTABLE CHEST 1 VIEW

[chest ap]
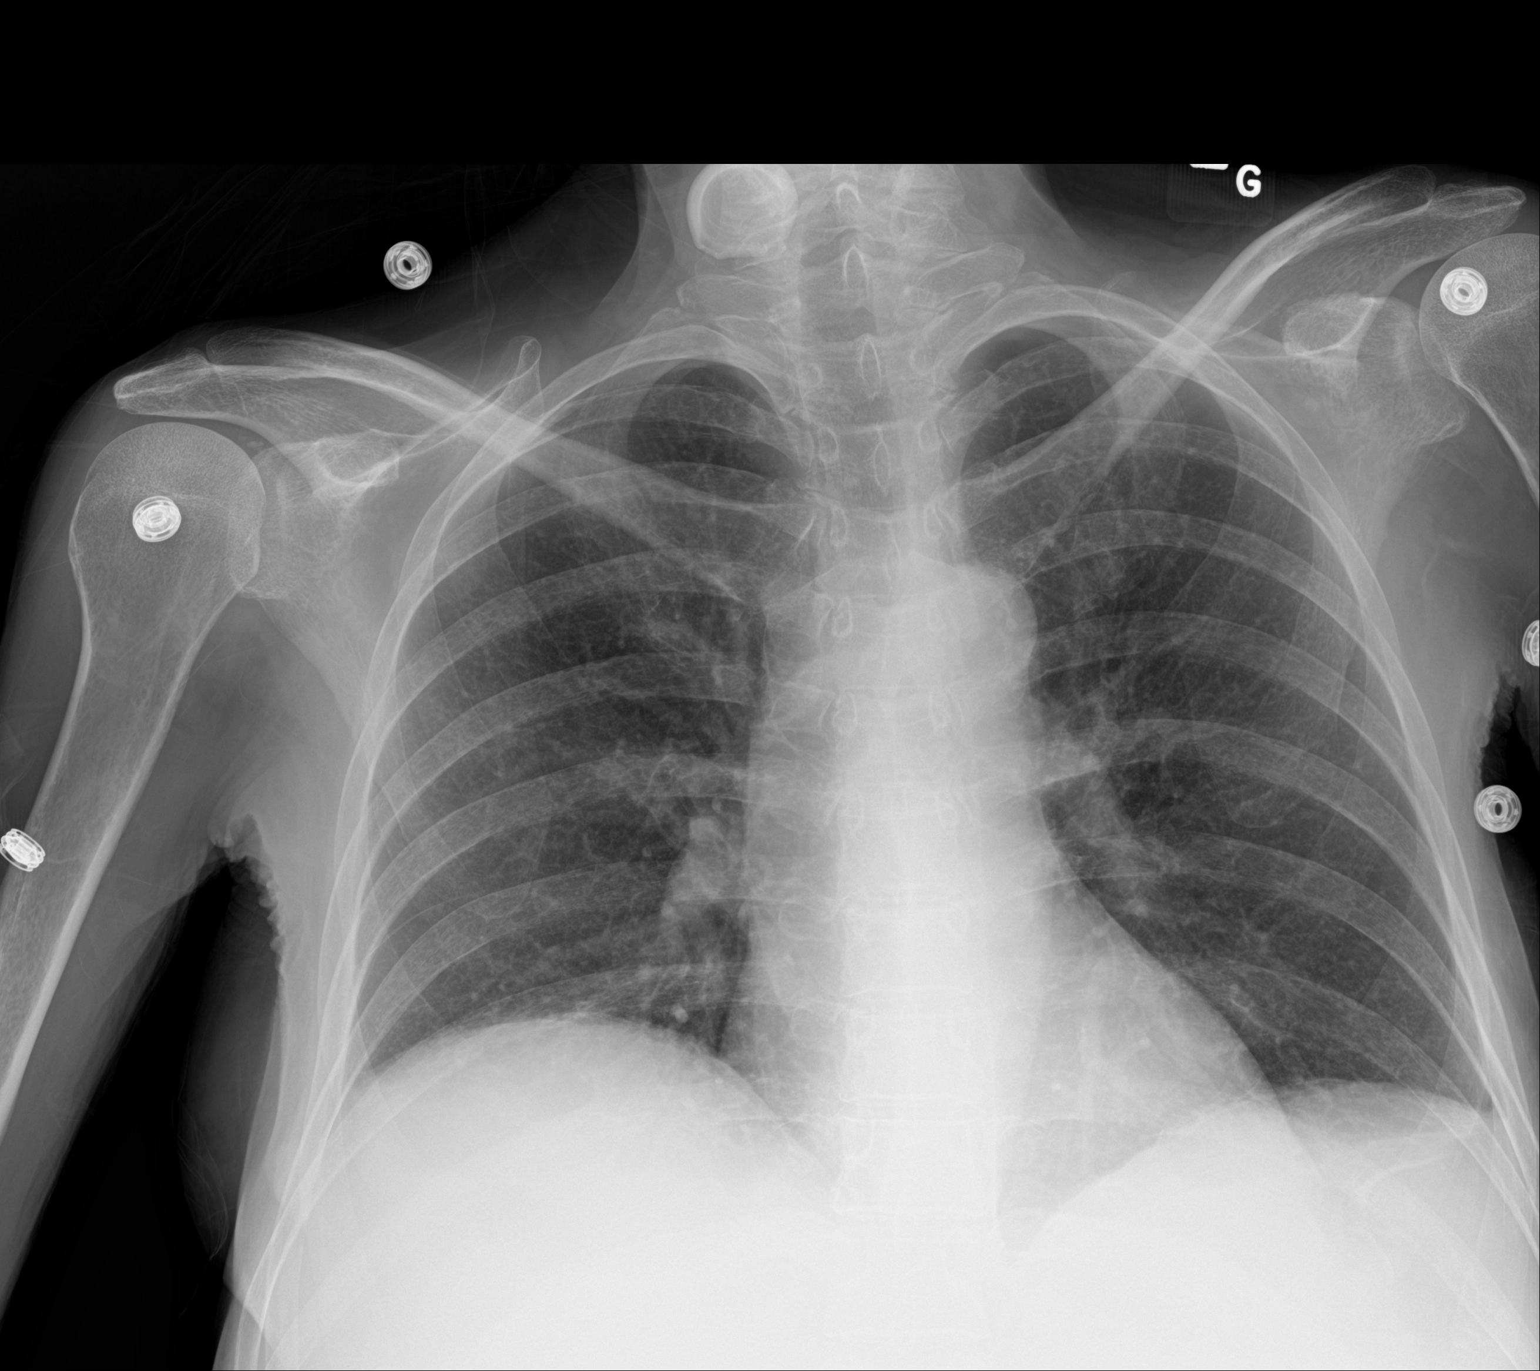

[1 of 1 positions shown; findings below may reference images not displayed]

FINDINGS: No focal consolidation or pleural effusion. Normal heart size. No
pneumothorax. Circular opacity over the right neck is likely an
artifact.
IMPRESSION: Low lung volumes.  No acute infiltrate or edema.

## 2017-09-21 ENCOUNTER — Ambulatory Visit (HOSPITAL_COMMUNITY): Payer: Self-pay | Admitting: Licensed Clinical Social Worker

## 2017-09-22 ENCOUNTER — Telehealth: Payer: Self-pay | Admitting: Internal Medicine

## 2017-09-22 ENCOUNTER — Inpatient Hospital Stay: Payer: Managed Care, Other (non HMO) | Attending: Oncology | Admitting: Oncology

## 2017-09-22 VITALS — BP 120/76 | HR 70 | Temp 97.8°F | Resp 18 | Ht 66.0 in | Wt 120.4 lb

## 2017-09-22 DIAGNOSIS — K766 Portal hypertension: Secondary | ICD-10-CM | POA: Diagnosis not present

## 2017-09-22 DIAGNOSIS — D729 Disorder of white blood cells, unspecified: Secondary | ICD-10-CM

## 2017-09-22 DIAGNOSIS — R7989 Other specified abnormal findings of blood chemistry: Secondary | ICD-10-CM | POA: Insufficient documentation

## 2017-09-22 DIAGNOSIS — G9340 Encephalopathy, unspecified: Secondary | ICD-10-CM | POA: Diagnosis not present

## 2017-09-22 DIAGNOSIS — I8500 Esophageal varices without bleeding: Secondary | ICD-10-CM | POA: Insufficient documentation

## 2017-09-22 DIAGNOSIS — K7460 Unspecified cirrhosis of liver: Secondary | ICD-10-CM | POA: Diagnosis not present

## 2017-09-22 DIAGNOSIS — R188 Other ascites: Secondary | ICD-10-CM | POA: Insufficient documentation

## 2017-09-22 NOTE — Telephone Encounter (Signed)
Happy to see Dr. Alen Blew does not think there is a bone marrow problem Can hold off on endocrine eval for now (thus it can be canceled). I will check her Ca again at follow-up and if it becomes elevated again, then we can consider endocrinology at this time.

## 2017-09-22 NOTE — Progress Notes (Signed)
Reason for Referral: Evaluation for plasma cell disorder.  HPI: 57 year old woman currently of [place] but originally from [place].  She was diagnosed with cirrhosis of the liver in 2018 and have had notable hospitalizations since that time related complications of that.  He had recurrent ascites and encephalopathy.  Her disease status has stabilized and for the last few months have done well.  Her last trip to the emergency department was in December 2019 for fever and abdominal pain.  She had persistent hypercalcemia that had fluctuated since November 2018.  At that time her calcium was 11.1 and was high as 11.4 on January 2019.  Repeat calcium on September 07, 2017 was 10.4.  Based on these findings, she had a serum protein electrophoresis on September 07, 2017.  The SPEP showed no monoclonal protein detected at the time.  She did have an elevated kappa and lambda free light chains of 49.1 and 29.9 respectively.  Her kappa to lambda ratio is 1.64 which is normal.  The remaining of her laboratory testing was all within normal range.  Her creatinine was 1.19 with normal electrolytes.  She had a normal CBC with a hemoglobin of 14.6, white cell count of 8.5 blood count of 238.  Clinically, she does report occasional achiness in her joints but no bone pain, pathological fractures or peripheral neuropathy.  She had not had any recurrent infections or recent hospitalizations.  Her appetite is improving and she is gaining weight.  She is able to drive and attends to activities of daily living.   She does not report any headaches, blurry vision, syncope or seizures. Does not report any fevers, chills or sweats.  Does not report any cough, wheezing or hemoptysis.  Does not report any chest pain, palpitation, orthopnea or leg edema.  Does not report any nausea, vomiting or abdominal pain.  She does not report any constipation or diarrhea.  Does not report frequency, urgency or hematuria.  Does not report any skin  rashes or lesions.  Does not report any lymphadenopathy or petechiae. Remaining review of systems is negative.    Past Medical History:  Diagnosis Date  . 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  paracentesis 2.6 liters 5/4: no SBP.    Marland Kitchen Alcoholism ([place]) 12/2016  . Candida esophagitis ([place])   . Cirrhosis ([place])   . DVT (deep venous thrombosis) ([place])   . Dysphagia 2011  . Esophagitis   . Hepatic steatosis   . Hiatal hernia   . Hiatal hernia   . Macrocytic anemia 12/2016  . Malnutrition ([place]) 12/2016  . Panic type anxiety neurosis 2011  . Portal hypertension ([place])   . Portal hypertensive gastropathy ([facility])   :  Past Surgical History:  Procedure Laterality Date  . BREAST BIOPSY Left 04/2017   benign  . ESOPHAGOGASTRODUODENOSCOPY N/A 12/25/2016   Procedure: ESOPHAGOGASTRODUODENOSCOPY (EGD);  Surgeon: Jerene Bears, MD;  Location: [facility];  Service: Endoscopy;  Laterality: N/A;  . IR PARACENTESIS  02/02/2017  . IR TRANSCATHETER BX  02/02/2017  . IR US GUIDE VASC ACCESS RIGHT  02/02/2017  . IR VENOGRAM HEPATIC W HEMODYNAMIC EVALUATION  02/02/2017  :   Current Outpatient Medications:  .  folic acid (FOLVITE) 1 MG tablet, Take 1 tablet (1 mg total) by mouth daily., Disp: 100 tablet, Rfl: 5 .  furosemide (LASIX) 20 MG tablet, Take 4 tablets (80 mg total) by mouth daily. (Patient taking differently: Take  60 mg by mouth daily. ), Disp: , Rfl:  .  lactulose (CHRONULAC) 10 GM/15ML solution, Take 30 mLs (20 g total) by mouth 2 (two) times daily. (Patient taking differently: Take 30 g by mouth 3 (three) times daily. ), Disp: 1800 mL, Rfl: 1 .  Multiple Vitamin (MULTIVITAMIN WITH MINERALS) TABS tablet, Take 1 tablet by mouth daily., Disp: 30 tablet, Rfl: 0 .  nadolol (CORGARD) 20 MG tablet, Take 1 tablet (20 mg total) by mouth daily., Disp: 30 tablet, Rfl: 5 .  pantoprazole (PROTONIX) 40 MG tablet, TAKE 1 TABLET TWICE A DAY,  Disp: 180 tablet, Rfl: 0 .  QUEtiapine (SEROQUEL) 25 MG tablet, Take 1 tablet (25 mg total) by mouth at bedtime., Disp: 90 tablet, Rfl: 0 .  spironolactone (ALDACTONE) 100 MG tablet, Take 100 mg by mouth 2 (two) times daily. , Disp: , Rfl:  .  feeding supplement, ENSURE ENLIVE, (ENSURE ENLIVE) LIQD, Take 237 mLs by mouth 3 (three) times daily between meals. (Patient not taking: Reported on 09/07/2017), Disp: 90 Bottle, Rfl: 0:  Allergies  Allergen Reactions  . Adhesive [Tape]   :  Family History  Problem Relation Age of Onset  . Hyperlipidemia Father   . Heart disease Father   . Alcoholism Father   . Heart attack Father   . Alcoholism Mother   . Alcoholism Sister   :  Social History   Socioeconomic History  . Marital status: Married    Spouse name: Not on file  . Number of children: Not on file  . Years of education: Not on file  . Highest education level: Not on file  Social Needs  . Financial resource strain: Not on file  . Food insecurity - worry: Not on file  . Food insecurity - inability: Not on file  . Transportation needs - medical: Not on file  . Transportation needs - non-medical: Not on file  Occupational History  . Occupation: Unemployed  Tobacco Use  . Smoking status: Former Smoker    Types: Cigarettes  . Smokeless tobacco: Never Used  . Tobacco comment: Reports she quit "about 6 months ago"  Substance and Sexual Activity  . Alcohol use: Yes    Comment: Last drink: 6/18  . Drug use: No    Comment: Last use Cocaine 11/17, reports last use "over a year", [facility] 2/18, reports "a year" ago  . Sexual activity: Not on file  Other Topics Concern  . Not on file  Social History Narrative   Married, children, not working  :  Pertinent items are noted in HPI.  Exam: Blood pressure 120/76, pulse 70, temperature 97.8 F (36.6 C), temperature source Oral, resp. rate 18, height _0  (1.676 m), weight 120 lb 6.4 oz (54.6 kg), SpO2 100 %.  ECOG 1 General appearance:  alert and cooperative appeared without distress. Eyes: conjunctivae/corneas clear. PERRL.  Throat: lips, mucosa, and tongue normal; no thrush or ulcers. Resp: clear to auscultation bilaterally without wheezes or dullness to percussion. Cardio: regular rate and rhythm, S1, S2 normal, no murmur, click, rub or gallop GI: soft, non-tender; bowel sounds normal; no masses,  no organomegaly no shifting dullness or ascites. Skin: Skin color, texture, turgor normal. No rashes or lesions.  No petechiae. Lymph nodes: Cervical, supraclavicular, and axillary nodes normal. Neurologic: Grossly normal without any sensory or motor deficits.  CBC    Component Value Date/Time   WBC 8.5 09/07/2017 1640   RBC 4.71 09/07/2017 1640   HGB 14.6 09/07/2017 1640  HCT 43.7 09/07/2017 1640   HCT 23.3 (L) 12/25/2016 0654   PLT 238.0 09/07/2017 1640   MCV 92.6 09/07/2017 1640   MCH 31.2 07/26/2017 0945   MCHC 33.5 09/07/2017 1640   RDW 14.0 09/07/2017 1640   LYMPHSABS 2.7 09/07/2017 1640   MONOABS 0.9 09/07/2017 1640   EOSABS 0.1 09/07/2017 1640   BASOSABS 0.1 09/07/2017 1640     Chemistry      Component Value Date/Time   NA 136 09/07/2017 1640   NA 136 09/07/2017 1640   NA 130 (A) 05/12/2017   K 3.9 09/07/2017 1640   K 3.9 09/07/2017 1640   CL 97 09/07/2017 1640   CL 97 09/07/2017 1640   CO2 27 09/07/2017 1640   CO2 27 09/07/2017 1640   BUN 9 09/07/2017 1640   BUN 9 09/07/2017 1640   BUN 10 05/12/2017   CREATININE 1.19 09/07/2017 1640   CREATININE 1.19 09/07/2017 1640   CREATININE 0.91 05/12/2017 1637   GLU 93 05/05/2017      Component Value Date/Time   CALCIUM 10.4 09/07/2017 1640   CALCIUM 10.4 09/07/2017 1640   ALKPHOS 103 09/07/2017 1640   ALKPHOS 103 09/07/2017 1640   AST 32 09/07/2017 1640   AST 32 09/07/2017 1640   ALT 17 09/07/2017 1640   ALT 17 09/07/2017 1640   BILITOT 1.3 (H) 09/07/2017 1640   BILITOT 1.3 (H) 09/07/2017 1640     EXAM: CT ABDOMEN AND PELVIS WITH  CONTRAST  TECHNIQUE: Multidetector CT imaging of the abdomen and pelvis was performed using the standard protocol following bolus administration of intravenous contrast.  CONTRAST:  154m ISOVUE-300 IOPAMIDOL (ISOVUE-300) INJECTION 61%  COMPARISON:  CT abdomen and pelvis of 05/06/2017  FINDINGS: Lower chest: Mild linear atelectasis or scarring is noted posteriorly in both lung bases. No pneumonia or effusion is seen. The heart is within normal limits in size.  Hepatobiliary: The liver somewhat small perhaps slightly irregular in outline with a small amount of perihepatic ascites, findings consistent with cirrhosis. No focal hepatic lesion is noted. The gallbladder is slightly distended but no gallstones are evident.  Pancreas: The pancreas is normal in size and the pancreatic duct is not dilated.  Spleen: The spleen is unchanged.  Adrenals/Urinary Tract: The adrenal glands appear normal. The kidneys enhance with no calculus or mass. On delayed images, the pelvocaliceal systems are unremarkable. No hydronephrosis is seen. The ureters appear to be normal in caliber. The urinary bladder is fairly decompressed and difficult to assess. No gross abnormality is evident.  Stomach/Bowel: The stomach is not well distended but no abnormality is noted. No small bowel distention is seen. There are scattered rectosigmoid colon diverticula present. No diverticulitis is noted. There is feces throughout the colon. The terminal ileum and the appendix are unremarkable  Vascular/Lymphatic: The abdominal aorta is normal in caliber. No adenopathy is seen.  Reproductive: The uterus is normal in size. No adnexal lesion is seen. A tiny amount of fluid layers dependently within the pelvis possibly representing ascites.  Other: None  Musculoskeletal: The lumbar vertebrae are in normal alignment with normal intervertebral disc spaces. The hips are unremarkable.  IMPRESSION: 1.  No explanation for the patient's pain is seen. There are changes of cirrhosis with a small amount of ascites noted. 2. The appendix and terminal ileum are unremarkable. 3. Moderate amount of feces throughout the colon.  IMPRESSION: 1. Stable cirrhotic changes involving the liver with associated portal venous hypertension, portal venous collaterals, esophageal varices and small volume  ascites. No splenomegaly. No worrisome hepatic lesions. 2. No pancreatic head mass is identified. 3. Large amount of stool throughout the colon and down into the rectum may suggest constipation.   Assessment and Plan:   57 year old woman with the following issues:  1.  Evaluation for plasma cell disorder: She had an elevated kappa and lambda light chains with normal ratio detected in January 2019.  She had a serum protein electrophoresis that did not pick up on a monoclonal gammopathy.  These findings were reviewed today in detail with the patient including the differential diagnosis.  These findings suggest a reactive abnormal elevation in her both kappa and lambda light chains.  These findings do not suggest a plasma cell disorder.  Multiple myeloma or MGUS usually will present with either kappa or lambda light chain with an abnormal ratio.  A normal ratio suggests reactive elevation likely related to her liver disease.  Multiple myeloma is considered unlikely given these findings.  She had multiple imaging studies including CT scan of the chest abdomen and pelvis as well as laboratory findings that goes against multiple myeloma.  I see no need for obtaining a bone marrow biopsy at this time to confirm that.  No hematology workup is needed moving forward.  2.  Hypercalcemia: Unclear etiology at this time.  Her most recent calcium on September 07, 2017 was 10.4.  Her total protein at the time was 8.1.  She has close to normal renal function with a creatinine clearance of almost 50 cc/min.  These findings do  not suggest plasma cell disorder.  Follow-up: I am happy to see her in the future as needed.  30 minutes was spent with the patient face-to-face today.  More than 50% of time was dedicated to patient counseling, education and answering her questions regarding her laboratory findings.

## 2017-09-22 NOTE — Telephone Encounter (Signed)
Pt aware and appt cancelled. °

## 2017-09-22 NOTE — Telephone Encounter (Signed)
Pt saw Dr. Alen Blew today at the cancer center. She is calling wanting to know if Dr. Hilarie Fredrickson wants/thinks she needs to keep the endocrinology appointment and if she needs any labs done. Please advise.

## 2017-09-28 ENCOUNTER — Ambulatory Visit (HOSPITAL_COMMUNITY): Payer: Self-pay | Admitting: Licensed Clinical Social Worker

## 2017-09-30 ENCOUNTER — Telehealth: Payer: Self-pay | Admitting: Family Medicine

## 2017-09-30 NOTE — Telephone Encounter (Signed)
Pt is waiting direction on whether to take 1 or 2 tablets w/ the new Rx, pt currently has 2 tablets left, contact to advise or send Rx to pharmacy

## 2017-09-30 NOTE — Telephone Encounter (Unsigned)
Copied from [place]. Topic: Quick Communication - See Telephone Encounter >> Sep 30, 2017  1:50 PM Hewitt Shorts wrote: CRM for notification. See Telephone encounter for: pt has questions regarding her quetiapine dosage -she has been taking it 2 times a day at bed time  as usual and realized once she was getting low on this medication that the directions say  One at bedtime -she needs clarification as to dosage and also she is going to need an early refill because of this best number [phone] [place]   09/30/17.

## 2017-09-30 NOTE — Telephone Encounter (Signed)
Spoke with pt and advised that all previous rx's for this medication state 1 tab or 2 prn. Advised pt and she voiced understanding. Nothing further needed at this time.

## 2017-09-30 NOTE — Telephone Encounter (Signed)
Spoke with patient, she stated the original directions from previous Dr was to take 2 tablets at bedtime of Seroquel. Directions per Dr. Martinique refill was 1 tab at bedtime. She has been taking 2 as previously prescribed and Rx will need to be sent to [facility]. Patient wants to know if she should be taking 1 tab or 2 tabs.

## 2017-10-02 ENCOUNTER — Other Ambulatory Visit: Payer: Self-pay | Admitting: General Surgery

## 2017-10-02 DIAGNOSIS — N6489 Other specified disorders of breast: Secondary | ICD-10-CM

## 2017-10-03 ENCOUNTER — Telehealth: Payer: Self-pay | Admitting: *Deleted

## 2017-10-03 NOTE — Telephone Encounter (Signed)
Patient requesting new Rx for Seroquel 25 mg tablets with a dose increase at bedtime to 2 tablets at bedtime. Currently takes 1 tablet at bedtime

## 2017-10-04 ENCOUNTER — Encounter: Payer: Self-pay | Admitting: Family Medicine

## 2017-10-04 ENCOUNTER — Ambulatory Visit (INDEPENDENT_AMBULATORY_CARE_PROVIDER_SITE_OTHER): Payer: Managed Care, Other (non HMO) | Admitting: Family Medicine

## 2017-10-04 ENCOUNTER — Other Ambulatory Visit (HOSPITAL_COMMUNITY)
Admission: RE | Admit: 2017-10-04 | Discharge: 2017-10-04 | Disposition: A | Discharge status: Home / Self Care | Payer: Managed Care, Other (non HMO) | Source: Ambulatory Visit | Attending: Family Medicine | Admitting: Family Medicine

## 2017-10-04 VITALS — BP 118/70 | HR 76 | Temp 97.9°F | Resp 12 | Ht 66.0 in | Wt 123.1 lb

## 2017-10-04 DIAGNOSIS — Z131 Encounter for screening for diabetes mellitus: Secondary | ICD-10-CM

## 2017-10-04 DIAGNOSIS — Z0000 Encounter for general adult medical examination without abnormal findings: Secondary | ICD-10-CM | POA: Diagnosis not present

## 2017-10-04 DIAGNOSIS — R200 Anesthesia of skin: Secondary | ICD-10-CM

## 2017-10-04 DIAGNOSIS — Z124 Encounter for screening for malignant neoplasm of cervix: Secondary | ICD-10-CM | POA: Diagnosis not present

## 2017-10-04 DIAGNOSIS — E785 Hyperlipidemia, unspecified: Secondary | ICD-10-CM | POA: Diagnosis not present

## 2017-10-04 DIAGNOSIS — Z780 Asymptomatic menopausal state: Secondary | ICD-10-CM | POA: Diagnosis not present

## 2017-10-04 DIAGNOSIS — G4700 Insomnia, unspecified: Secondary | ICD-10-CM

## 2017-10-04 LAB — LIPID PANEL
CHOL/HDL RATIO: 6
Cholesterol: 196 mg/dL (ref 0–200)
HDL: 33 mg/dL — ABNORMAL LOW (ref >39.00)
LDL CALC: 128 mg/dL — AB (ref 0–99)
NONHDL: 162.66
TRIGLYCERIDES: 174 mg/dL — AB (ref 0.0–149.0)
VLDL: 34.8 mg/dL (ref 0.0–40.0)

## 2017-10-04 LAB — HEMOGLOBIN A1C: Hgb A1c MFr Bld: 5.8 % (ref 4.6–6.5)

## 2017-10-04 MED ORDER — QUETIAPINE FUMARATE 25 MG PO TABS: 50.0000 mg | ORAL_TABLET | Freq: Every day | ORAL | 1 refills | Status: DC

## 2017-10-04 NOTE — Progress Notes (Signed)
HPI:   [name] is a 57 y.o. female, who is here today for her routine physical.  Last CPE: 4-5 years ago.  Regular exercise 3 or more time per week: Yes, she is going to the [facility] Following a healthy diet: Yes. She lives with her husband.  Chronic medical problems: OA, alcoholic cirrhosis,upper GI bleeding, DVT, HLD,and anxiety disorder among some.  She follows with GI, recently she had EGD, grade I varices in the lower 3rd of esophagus and moderate portal HT found. She was started on Nadolol.   Pap smear: 5 years ago "maybe." Hx of abnormal pap smears: Denies Hx of STD's Denies.  M: 15 G:2 P:2  LMP: 57 yo.    Immunization History  Administered Date(s) Administered  . Influenza,inj,Quad PF,6+ Mos 07/19/2017  . Pneumococcal Polysaccharide-23 07/19/2017  . Td 08/23/2004  . Tdap 01/19/2017    Mammogram: 03/2017, s/p breast Bx neg.Next mammogram in 10/2017. Colonoscopy: 09/07/17 DEXA: N/A  Hep C screening: 12/2016 NR  HLD: She is on non pharmacologic treatment. Following low fat diet.    Lab Results  Component Value Date   CHOL 237 (H) 03/25/2010   HDL 69.70 03/25/2010   LDLDIRECT 146.3 03/25/2010   TRIG 99.0 03/25/2010   CHOLHDL 3 03/25/2010   Insomnia:  She is on Seroquel 25-50 mg at bedtime. She has not taken it in 7 days ,she ran out, so has not slept well, 3 hours. When she takes Seroquel she sleeps 7 hours and feels rested next day. Hx of anxiety. She tried other medications,which names she does not recall,they did not help.  She was started on Seroquel while she was on alcohol use disorder rehab treatment,she completed 4 months therapy. She is tolerating medication well,no side effects reported.  She is also c/o persistent numbness on feet and distal LE,which she has had for about a year or so.It has been stable. She has not noted lower back pain or weakness.   Review of Systems  Constitutional: Positive for fatigue.  Negative for appetite change and fever.  HENT: Negative for dental problem, hearing loss, mouth sores, nosebleeds, trouble swallowing and voice change.   Eyes: Negative for redness and visual disturbance.  Respiratory: Negative for cough, shortness of breath and wheezing.   Cardiovascular: Negative for chest pain, palpitations and leg swelling.  Gastrointestinal: Negative for abdominal pain, blood in stool, nausea and vomiting.       No changes in bowel habits.  Endocrine: Negative for cold intolerance, heat intolerance, polydipsia, polyphagia and polyuria.  Genitourinary: Negative for decreased urine volume, dysuria, hematuria, vaginal bleeding and vaginal discharge.       No breast tenderness or nipple discharge.  Musculoskeletal: Positive for arthralgias and gait problem.  Skin: Negative for rash and wound.  Allergic/Immunologic: Positive for environmental allergies.  Neurological: Positive for tremors (Improved) and numbness. Negative for syncope, weakness and headaches.  Hematological: Negative for adenopathy. Bruises/bleeds easily.  Psychiatric/Behavioral: Positive for sleep disturbance. Negative for confusion and hallucinations. The patient is nervous/anxious.   All other systems reviewed and are negative.     Current Outpatient Medications on File Prior to Visit  Medication Sig Dispense Refill  . folic acid (FOLVITE) 1 MG tablet Take 1 tablet (1 mg total) by mouth daily. 100 tablet 5  . furosemide (LASIX) 20 MG tablet Take 4 tablets (80 mg total) by mouth daily. (Patient taking differently: Take 60 mg by mouth daily. )    . HYDROcodone-acetaminophen (NORCO/VICODIN) 5-325  MG tablet     . lactulose (CHRONULAC) 10 GM/15ML solution Take 30 mLs (20 g total) by mouth 2 (two) times daily. (Patient taking differently: Take 30 g by mouth 3 (three) times daily. ) 1800 mL 1  . Multiple Vitamin (MULTIVITAMIN WITH MINERALS) TABS tablet Take 1 tablet by mouth daily. 30 tablet 0  . nadolol  (CORGARD) 20 MG tablet Take 1 tablet (20 mg total) by mouth daily. 30 tablet 5  . pantoprazole (PROTONIX) 40 MG tablet TAKE 1 TABLET TWICE A DAY 180 tablet 0  . spironolactone (ALDACTONE) 100 MG tablet Take 100 mg by mouth 2 (two) times daily.     . feeding supplement, ENSURE ENLIVE, (ENSURE ENLIVE) LIQD Take 237 mLs by mouth 3 (three) times daily between meals. (Patient not taking: Reported on 10/04/2017) 90 Bottle 0   No current facility-administered medications on file prior to visit.      Past Medical History:  Diagnosis Date  . 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  paracentesis 2.6 liters 5/4: no SBP.    Marland Kitchen Alcoholism ([place]) 12/2016  . Candida esophagitis ([place])   . Cirrhosis ([place])   . DVT (deep venous thrombosis) ([place])   . Dysphagia 2011  . Esophagitis   . Hepatic steatosis   . Hiatal hernia   . Hiatal hernia   . Macrocytic anemia 12/2016  . Malnutrition ([place]) 12/2016  . Panic type anxiety neurosis 2011  . Portal hypertension ([place])   . Portal hypertensive gastropathy [facility])     Past Surgical History:  Procedure Laterality Date  . BREAST BIOPSY Left 04/2017   benign  . ESOPHAGOGASTRODUODENOSCOPY N/A 12/25/2016   Procedure: ESOPHAGOGASTRODUODENOSCOPY (EGD);  Surgeon: Jerene Bears, MD;  Location: [facility];  Service: Endoscopy;  Laterality: N/A;  . IR PARACENTESIS  02/02/2017  . IR TRANSCATHETER BX  02/02/2017  . IR US GUIDE VASC ACCESS RIGHT  02/02/2017  . IR VENOGRAM HEPATIC W HEMODYNAMIC EVALUATION  02/02/2017    Allergies  Allergen Reactions  . Adhesive [Tape]     Family History  Problem Relation Age of Onset  . Hyperlipidemia Father   . Heart disease Father   . Alcoholism Father   . Heart attack Father   . Alcoholism Mother   . Alcoholism Sister     Social History   Socioeconomic History  . Marital status: Married    Spouse name: None  . Number of children: None  . Years of education:  None  . Highest education level: None  Social Needs  . Financial resource strain: None  . Food insecurity - worry: None  . Food insecurity - inability: None  . Transportation needs - medical: None  . Transportation needs - non-medical: None  Occupational History  . Occupation: Unemployed  Tobacco Use  . Smoking status: Former Smoker    Types: Cigarettes  . Smokeless tobacco: Never Used  . Tobacco comment: Reports she quit "about 6 months ago"  Substance and Sexual Activity  . Alcohol use: Yes    Comment: Last drink: 6/18  . Drug use: No    Comment: Last use Cocaine 11/17, reports last use "over a year", [facility] 2/18, reports "a year" ago  . Sexual activity: None  Other Topics Concern  . None  Social History Narrative   Married, children, not working     Vitals:   10/04/17 0902  BP: 118/70  Pulse: 76  Resp: 12  Temp: 97.9 F (36.6 C)  SpO2: 100%   Body mass index is 19.87 kg/m.   Wt Readings from Last 3 Encounters:  10/04/17 123 lb 2 oz (55.8 kg)  09/22/17 120 lb 6.4 oz (54.6 kg)  09/07/17 118 lb (53.5 kg)      Physical Exam  Nursing note and vitals reviewed. Constitutional: She is oriented to person, place, and time. She appears well-developed and well-nourished. No distress.  HENT:  Head: Normocephalic and atraumatic.  Right Ear: Hearing, tympanic membrane, external ear and ear canal normal.  Left Ear: Hearing, tympanic membrane, external ear and ear canal normal.  Mouth/Throat: Uvula is midline, oropharynx is clear and moist and mucous membranes are normal.  Eyes: Conjunctivae and EOM are normal. Pupils are equal, round, and reactive to light.  Neck: No tracheal deviation present. No thyromegaly present.  Cardiovascular: Normal rate and regular rhythm.  No murmur heard. Pulses:      Dorsalis pedis pulses are 2+ on the right side, and 2+ on the left side.  Respiratory: Effort normal and breath sounds normal. No respiratory distress.  GI: Soft. She exhibits  no mass. There is no hepatomegaly. There is no tenderness.  Genitourinary: There is no rash, tenderness or lesion on the right labia. There is no rash, tenderness or lesion on the left labia. Uterus is not enlarged and not tender. Cervix exhibits no motion tenderness, no discharge and no friability. Right adnexum displays no mass, no tenderness and no fullness. Left adnexum displays no mass, no tenderness and no fullness. No erythema or bleeding in the vagina. No vaginal discharge found.  Genitourinary Comments: Breast: Fibrocystic changes outer upper quadrant,R>L.No nipple discharge,masse,or skin changes. Pap smear collected.  Musculoskeletal: She exhibits no edema or tenderness.  No major deformity or signs of synovitis appreciated.  Lymphadenopathy:    She has no cervical adenopathy.    She has no axillary adenopathy.       Right: No inguinal and no supraclavicular adenopathy present.       Left: No inguinal and no supraclavicular adenopathy present.  Neurological: She is alert and oriented to person, place, and time. She has normal strength. No cranial nerve deficit. Coordination and gait normal.  Reflex Scores:      Bicep reflexes are 2+ on the right side and 2+ on the left side.      Patellar reflexes are 2+ on the right side and 2+ on the left side. Skin: Skin is warm. No rash noted. No erythema.  Psychiatric: She has a normal mood and affect. Her speech is normal.  Well groomed, good eye contact.     ASSESSMENT AND PLAN:  Ms. Mercedes Mitchell was here today for her annual exam.   Orders Placed This Encounter  Procedures  . DEXAScan  . Hemoglobin A1c  . Lipid panel  . NCV with EMG(electromyography)   Lab Results  Component Value Date   HGBA1C 5.8 10/04/2017   Lab Results  Component Value Date   CHOL 196 10/04/2017   HDL 33.00 (L) 10/04/2017   LDLCALC 128 (H) 10/04/2017   LDLDIRECT 146.3 03/25/2010   TRIG 174.0 (H) 10/04/2017   CHOLHDL 6 10/04/2017    Routine  general medical examination at a health care facility  We discussed the importance of regular physical activity and healthy diet for prevention of chronic illness and/or complications. Preventive guidelines reviewed. Vaccination up to date. Mammogram due in 3-11/2017. Ca++ and vit D supplementation recommended. DEXA will be arranged. Next CPE  in a year.  The 10-year ASCVD risk score Mikey Bussing DC Brooke Bonito., et al., 2013) is: 2.7%   Values used to calculate the score:     Age: 74 years     Sex: Female     Is Non-Hispanic African American: No     Diabetic: No     Tobacco smoker: No     Systolic Blood Pressure: 283 mmHg     Is BP treated: No     HDL Cholesterol: 33 mg/dL     Total Cholesterol: 196 mg/dL  Asymptomatic postmenopausal estrogen deficiency -     DEXAScan; Future  Diabetes mellitus screening -     Hemoglobin A1c  Cervical cancer screening -     Cytology - PAP ([place])  Numbness of lower extremity  Possible etiologies discussed. ? Alcoholic neuropathy,radiculopathy among some. EMG will be arranged. Foot care discussed.  -     NCV with EMG(electromyography); Future   Insomnia disorder Well controlled with Seroquel 25-50 mg. No changes in current management. Good sleep hygiene. F/U in 3-4 months.   Hyperlipidemia Continue non pharmacologic treatment. Further recommendations will be given according to lab results. F/U in 6-12 months.      Return in 4 months (on 02/01/2018) for insomnia.      Brittinie Wherley G. [place], MD  [facility]. [place] office.

## 2017-10-04 NOTE — Assessment & Plan Note (Signed)
Well controlled with Seroquel 25-50 mg. No changes in current management. Good sleep hygiene. F/U in 3-4 months.

## 2017-10-04 NOTE — Telephone Encounter (Signed)
She is coming today,so I will address this problem with her.I have not prescribed this medication for her,I did sent a refill as requested. Thanks, BJ

## 2017-10-04 NOTE — Assessment & Plan Note (Signed)
Continue non pharmacologic treatment. Further recommendations will be given according to lab results. F/U in 6-12 months.

## 2017-10-04 NOTE — Patient Instructions (Signed)
A few things to remember from today's visit:   Routine general medical examination at a health care facility  Hyperlipidemia, unspecified hyperlipidemia type - Plan: Lipid panel  Asymptomatic postmenopausal estrogen deficiency - Plan: DEXAScan  Diabetes mellitus screening - Plan: Hemoglobin A1c  Cervical cancer screening - Plan: Cytology - PAP ([place])  Today you have you routine preventive visit.  At least 150 minutes of moderate exercise per week, daily brisk walking for 15-30 min is a good exercise option. Healthy diet low in saturated (animal) fats and sweets and consisting of fresh fruits and vegetables, lean meats such as fish and white chicken and whole grains.  These are some of recommendations for screening depending of age and risk factors:   - Vaccines:  Tdap vaccine every 10 years.  Shingles vaccine recommended at age 71, could be given after 56 years of age but not sure about insurance coverage.   Pneumonia vaccines:  Prevnar 13 at 65 and Pneumovax at 68. Sometimes Pneumovax is giving earlier if history of smoking, lung disease,diabetes,kidney disease among some.    Screening for diabetes at age 41 and every 3 years.  Cervical cancer prevention:  Pap smear starts at 56 years of age and continues periodically until 56 years old in low risk women. Pap smear every 3 years between 66 and 65 years old. Pap smear every 3-5 years between women 28 and older if pap smear negative and HPV screening negative.   -Breast cancer: Mammogram: There is disagreement between experts about when to start screening in low risk asymptomatic female but recent recommendations are to start screening at 57 and not later than 56 years old , every 1-2 years and after 56 yo q 2 years. Screening is recommended until 56 years old but some women can continue screening depending of healthy issues.   Colon cancer screening: starts at 56 years old until 56 years old.  Cholesterol disorder  screening at age 16 and every 3 years.  Also recommended:  1. Dental visit- Brush and floss your teeth twice daily; visit your dentist twice a year. 2. Eye doctor- Get an eye exam at least every 2 years. 3. Helmet use- Always wear a helmet when riding a bicycle, motorcycle, rollerblading or skateboarding. 4. Safe sex- If you may be exposed to sexually transmitted infections, use a condom. 5. Seat belts- Seat belts can save your live; always wear one. 6. Smoke/Carbon Monoxide detectors- These detectors need to be installed on the appropriate level of your home. Replace batteries at least once a year. 7. Skin cancer- When out in the sun please cover up and use sunscreen 15 SPF or higher. 8. Violence- If anyone is threatening or hurting you, please tell your healthcare provider.  9. Drink alcohol in moderation- Limit alcohol intake to one drink or less per day. Never drink and drive.  Please be sure medication list is accurate. If a new problem present, please set up appointment sooner than planned today.

## 2017-10-05 ENCOUNTER — Ambulatory Visit (HOSPITAL_COMMUNITY): Payer: Self-pay | Admitting: Licensed Clinical Social Worker

## 2017-10-05 LAB — CYTOLOGY - PAP
Diagnosis: NEGATIVE
HPV: DETECTED — AB

## 2017-10-12 ENCOUNTER — Ambulatory Visit (HOSPITAL_COMMUNITY): Payer: Self-pay | Admitting: Licensed Clinical Social Worker

## 2017-10-13 ENCOUNTER — Encounter: Payer: Self-pay | Admitting: Internal Medicine

## 2017-10-19 ENCOUNTER — Ambulatory Visit (HOSPITAL_COMMUNITY): Payer: Self-pay | Admitting: Licensed Clinical Social Worker

## 2017-10-20 ENCOUNTER — Ambulatory Visit: Payer: Self-pay | Admitting: Internal Medicine

## 2017-10-21 ENCOUNTER — Other Ambulatory Visit: Payer: Self-pay | Admitting: Family Medicine

## 2017-10-21 DIAGNOSIS — G4700 Insomnia, unspecified: Secondary | ICD-10-CM

## 2017-10-21 NOTE — Telephone Encounter (Signed)
Copied from [place]. Topic: Quick Communication - Rx Refill/Question >> Oct 21, 2017  3:06 PM Scherrie Gerlach wrote: Medication: QUEtiapine (SEROQUEL) 25 MG tablet   Has the patient contacted their pharmacy? yes Pt states this [facility]. But [facility]. [facility] told the pt they have reached out several times to the office with no success in getting Rx sent to them Pt hopes it can be sent today, because she is going to need and I takes 7-10 days for delivery. [place], [place] - [address] [phone] (Phone) [phone] (Fax)

## 2017-10-21 NOTE — Telephone Encounter (Signed)
Last OV: 10/04/17 PCP: [place] [facility]: [place], [place] [phone] (Phone) [phone] (Fax)   Patient said this was sent to the wrong pharmacy on 10/04/17.

## 2017-10-24 MED ORDER — QUETIAPINE FUMARATE 25 MG PO TABS: 50.0000 mg | ORAL_TABLET | Freq: Every day | ORAL | 1 refills | Status: DC

## 2017-10-26 ENCOUNTER — Ambulatory Visit (HOSPITAL_COMMUNITY): Payer: Self-pay | Admitting: Licensed Clinical Social Worker

## 2017-11-02 ENCOUNTER — Ambulatory Visit (HOSPITAL_COMMUNITY): Payer: Self-pay | Admitting: Licensed Clinical Social Worker

## 2017-11-08 ENCOUNTER — Encounter: Payer: Self-pay | Admitting: *Deleted

## 2017-11-09 ENCOUNTER — Ambulatory Visit (HOSPITAL_COMMUNITY): Payer: Self-pay | Admitting: Licensed Clinical Social Worker

## 2017-11-12 IMAGING — CR DG CHEST 2V
2 series · 2 of 2 positions shown · non-contrast
Comparison: X-ray dated 02/03/2017.

CLINICAL DATA: Fever and shortness of breath for 2-3 days. History
of cirrhosis. Ex-smoker.

EXAM:
CHEST  2 VIEW

[w chest pa]
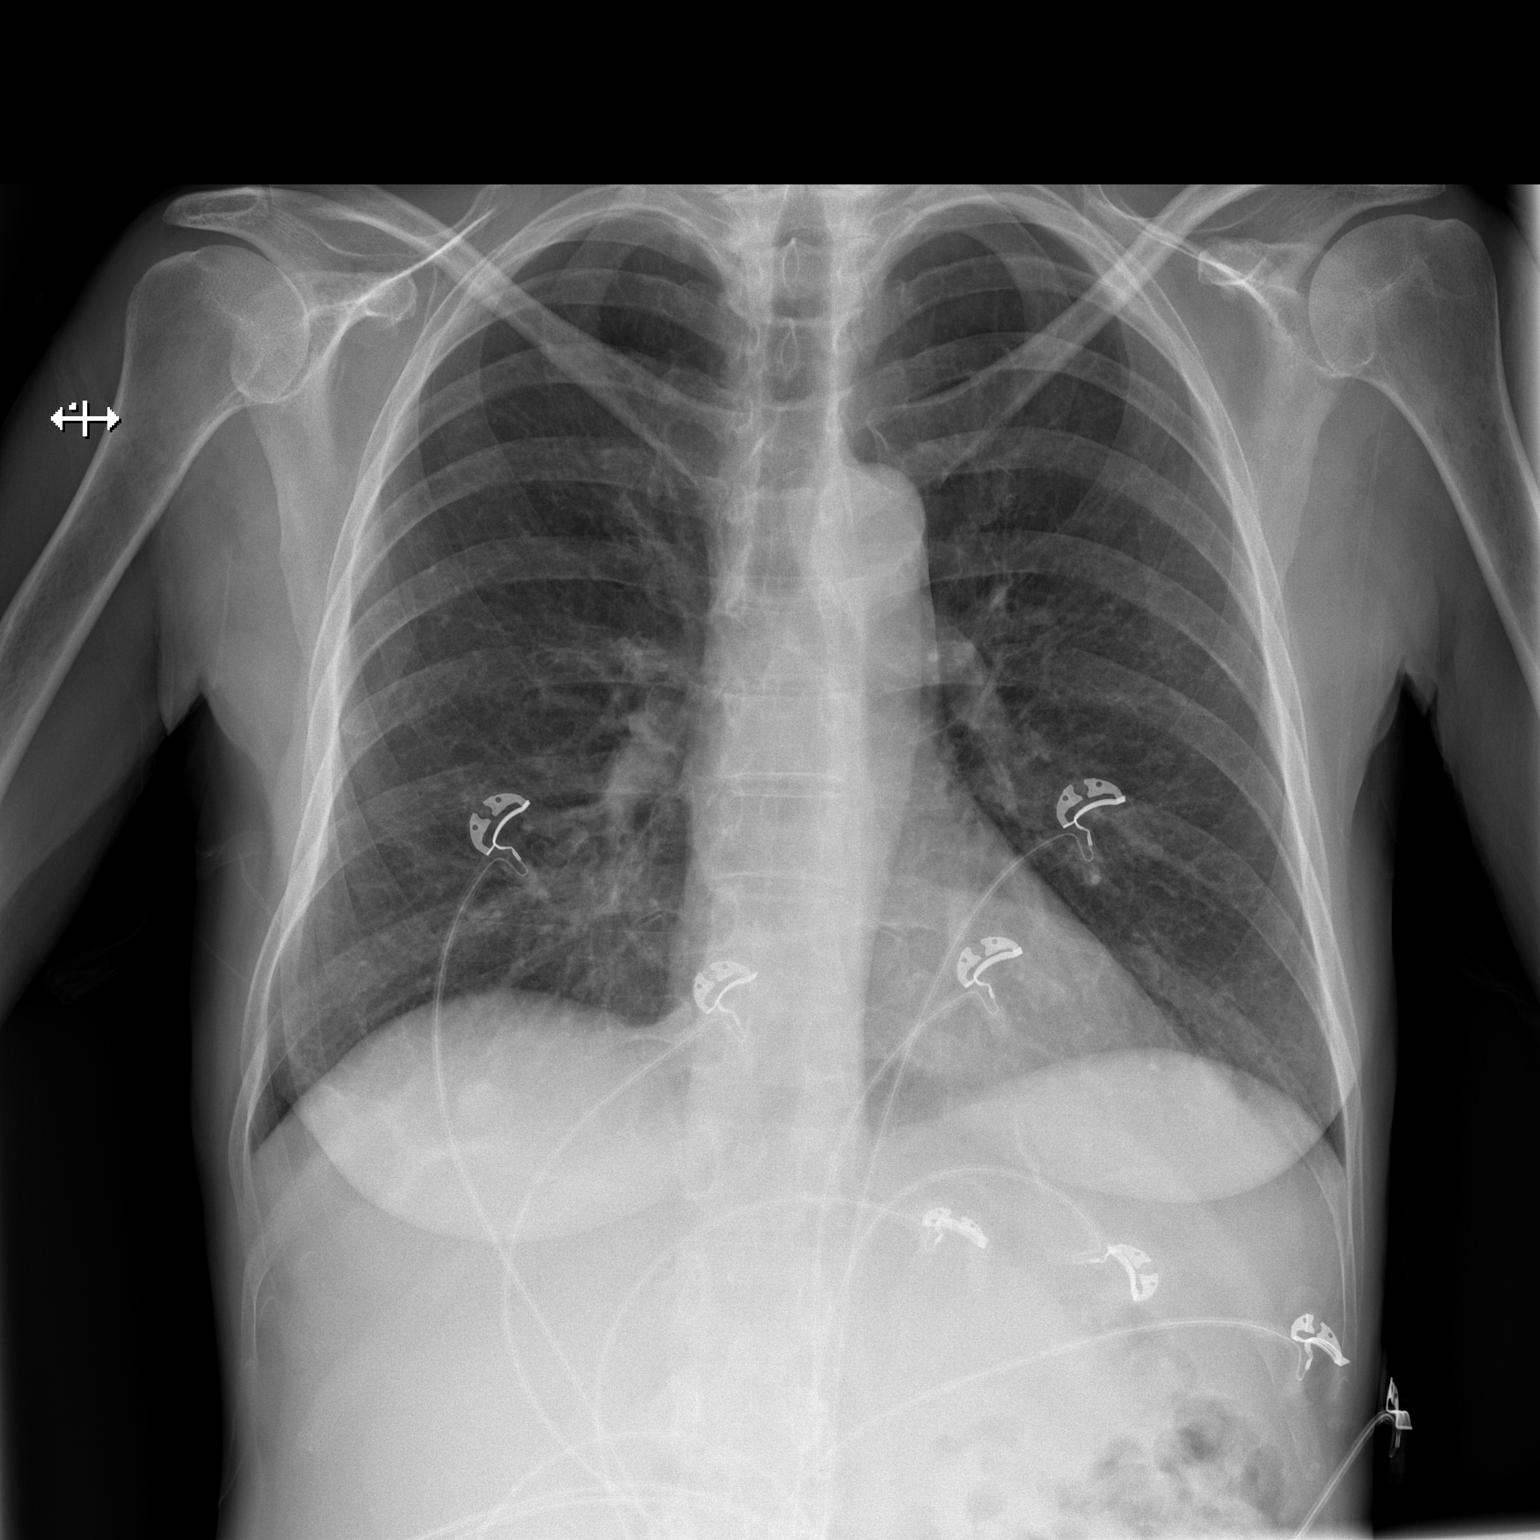

[w chest lat]
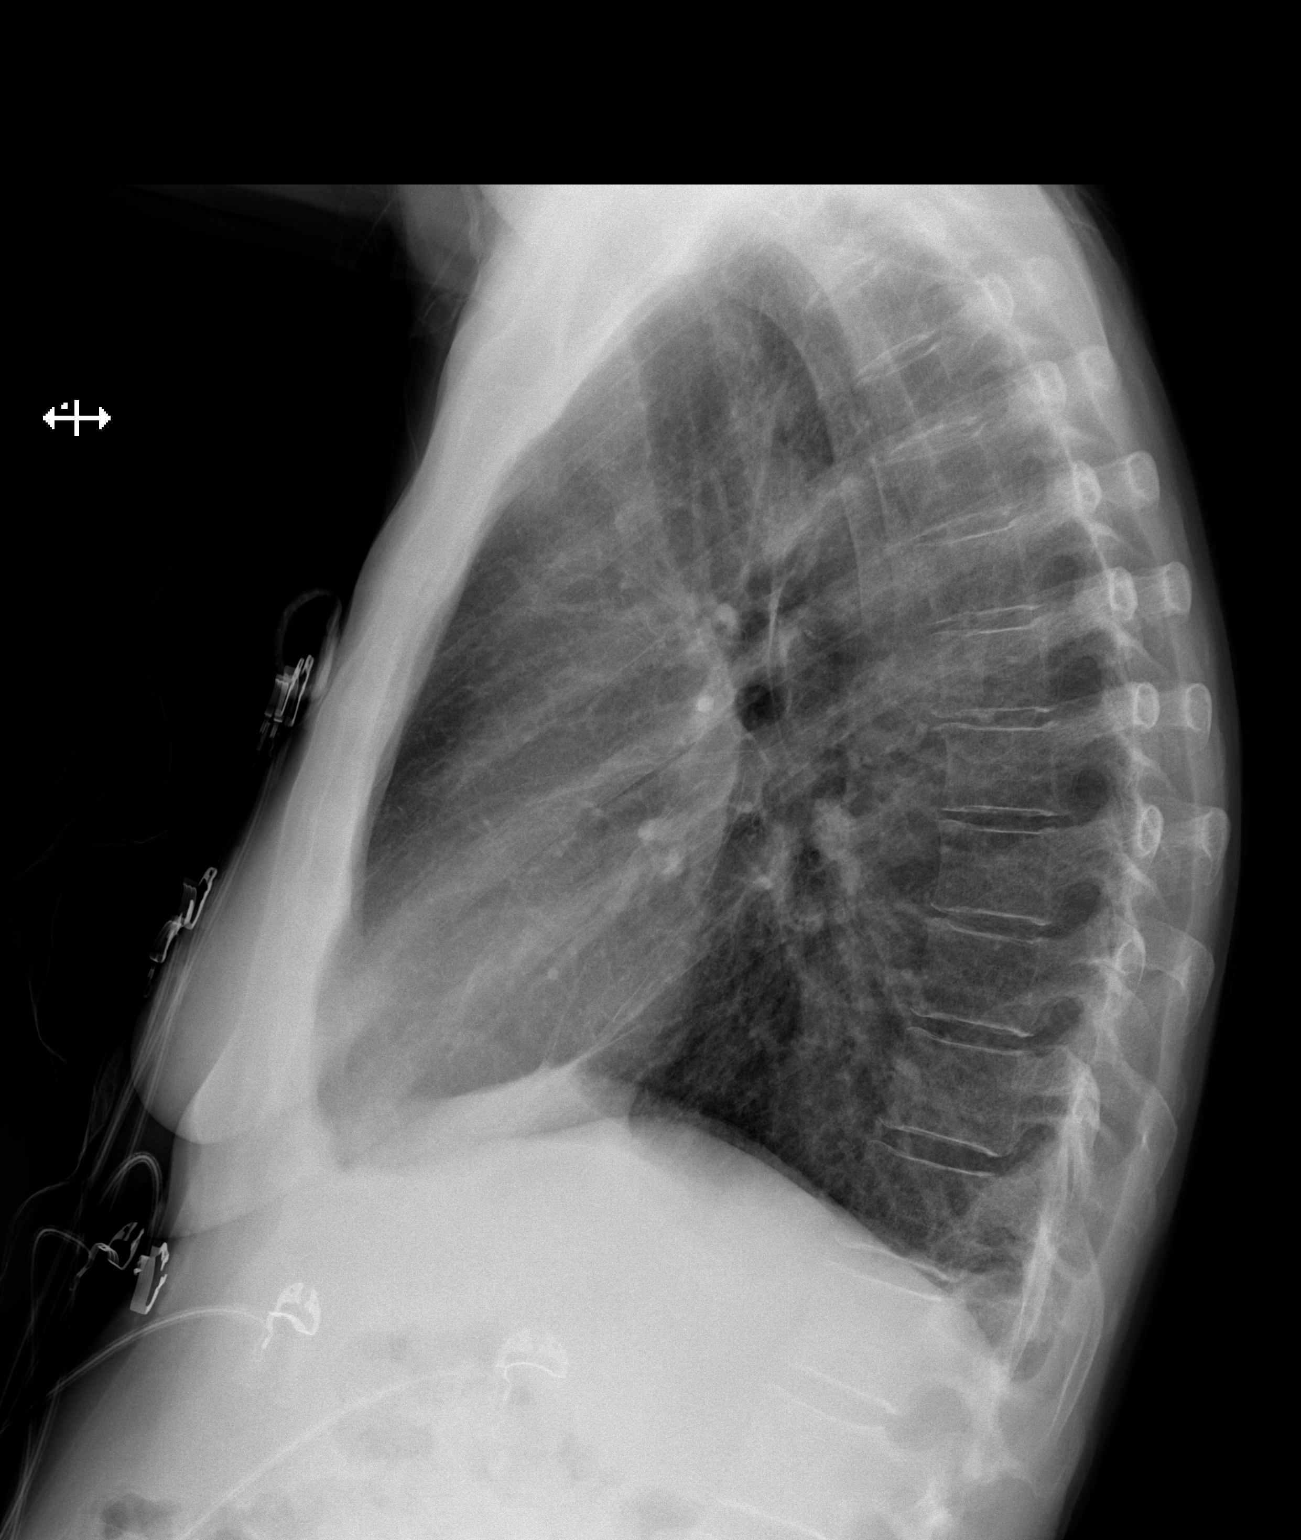

[2 of 2 positions shown; findings below may reference images not displayed]

FINDINGS: Heart size and mediastinal contours are normal.

Subtle hazy opacity at the medial aspects of the right lung base,
pneumonia versus superimposition of normal pulmonary vessels. Lungs
otherwise clear. No pleural effusion or pneumothorax seen. No acute
or suspicious osseous finding.
IMPRESSION: Subtle hazy opacity at the medial aspects of the right lung base,
suspicious for early developing pneumonia, alternatively
superimposition of normal pulmonary vessels. Recommend follow-up
chest x-ray to ensure clearing.

## 2017-11-16 ENCOUNTER — Ambulatory Visit (HOSPITAL_COMMUNITY): Payer: Self-pay | Admitting: Licensed Clinical Social Worker

## 2017-11-18 ENCOUNTER — Other Ambulatory Visit: Payer: Self-pay

## 2017-11-20 ENCOUNTER — Other Ambulatory Visit: Payer: Self-pay | Admitting: Internal Medicine

## 2017-11-23 ENCOUNTER — Encounter: Payer: Self-pay | Admitting: Internal Medicine

## 2017-11-23 ENCOUNTER — Encounter (INDEPENDENT_AMBULATORY_CARE_PROVIDER_SITE_OTHER): Payer: Self-pay

## 2017-11-23 ENCOUNTER — Ambulatory Visit (HOSPITAL_COMMUNITY): Payer: Self-pay | Admitting: Licensed Clinical Social Worker

## 2017-11-23 ENCOUNTER — Ambulatory Visit (INDEPENDENT_AMBULATORY_CARE_PROVIDER_SITE_OTHER): Payer: Managed Care, Other (non HMO) | Admitting: Internal Medicine

## 2017-11-23 ENCOUNTER — Other Ambulatory Visit (INDEPENDENT_AMBULATORY_CARE_PROVIDER_SITE_OTHER): Payer: Managed Care, Other (non HMO)

## 2017-11-23 VITALS — BP 110/62 | HR 92 | Ht 66.0 in | Wt 118.6 lb

## 2017-11-23 DIAGNOSIS — K7030 Alcoholic cirrhosis of liver without ascites: Secondary | ICD-10-CM | POA: Diagnosis not present

## 2017-11-23 DIAGNOSIS — I8500 Esophageal varices without bleeding: Secondary | ICD-10-CM

## 2017-11-23 DIAGNOSIS — K3189 Other diseases of stomach and duodenum: Secondary | ICD-10-CM

## 2017-11-23 DIAGNOSIS — K7290 Hepatic failure, unspecified without coma: Secondary | ICD-10-CM

## 2017-11-23 DIAGNOSIS — K7682 Hepatic encephalopathy: Secondary | ICD-10-CM

## 2017-11-23 DIAGNOSIS — K7460 Unspecified cirrhosis of liver: Secondary | ICD-10-CM | POA: Diagnosis not present

## 2017-11-23 DIAGNOSIS — K766 Portal hypertension: Secondary | ICD-10-CM | POA: Diagnosis not present

## 2017-11-23 LAB — COMPREHENSIVE METABOLIC PANEL
ALT: 26 U/L (ref 0–35)
AST: 38 U/L — ABNORMAL HIGH (ref 0–37)
Albumin: 4.3 g/dL (ref 3.5–5.2)
Alkaline Phosphatase: 122 U/L — ABNORMAL HIGH (ref 39–117)
BILIRUBIN TOTAL: 1.1 mg/dL (ref 0.2–1.2)
BUN: 12 mg/dL (ref 6–23)
CHLORIDE: 99 meq/L (ref 96–112)
CO2: 30 meq/L (ref 19–32)
Calcium: 9.9 mg/dL (ref 8.4–10.5)
Creatinine, Ser: 1 mg/dL (ref 0.40–1.20)
GFR: 61.06 mL/min (ref >60.00)
GLUCOSE: 77 mg/dL (ref 70–99)
POTASSIUM: 4.2 meq/L (ref 3.5–5.1)
Sodium: 138 mEq/L (ref 135–145)
Total Protein: 8.4 g/dL — ABNORMAL HIGH (ref 6.0–8.3)

## 2017-11-23 LAB — CBC WITH DIFFERENTIAL/PLATELET
BASOS ABS: 0.1 10*3/uL (ref 0.0–0.1)
BASOS PCT: 0.9 % (ref 0.0–3.0)
EOS PCT: 1.6 % (ref 0.0–5.0)
Eosinophils Absolute: 0.1 10*3/uL (ref 0.0–0.7)
HEMATOCRIT: 42.4 % (ref 36.0–46.0)
Hemoglobin: 14.7 g/dL (ref 12.0–15.0)
Lymphocytes Relative: 41.5 % (ref 12.0–46.0)
Lymphs Abs: 3 10*3/uL (ref 0.7–4.0)
MCHC: 34.7 g/dL (ref 30.0–36.0)
MCV: 90.2 fl (ref 78.0–100.0)
MONOS PCT: 11 % (ref 3.0–12.0)
Monocytes Absolute: 0.8 10*3/uL (ref 0.1–1.0)
NEUTROS ABS: 3.3 10*3/uL (ref 1.4–7.7)
Neutrophils Relative %: 45 % (ref 43.0–77.0)
PLATELETS: 237 10*3/uL (ref 150.0–400.0)
RBC: 4.7 Mil/uL (ref 3.87–5.11)
RDW: 12.9 % (ref 11.5–15.5)
WBC: 7.3 10*3/uL (ref 4.0–10.5)

## 2017-11-23 LAB — PROTIME-INR
INR: 1.2 ratio — AB (ref 0.8–1.0)
PROTHROMBIN TIME: 12.6 s (ref 9.6–13.1)

## 2017-11-23 MED ORDER — FUROSEMIDE 20 MG PO TABS: 40.0000 mg | ORAL_TABLET | Freq: Every day | ORAL | Status: DC

## 2017-11-23 MED ORDER — PANTOPRAZOLE SODIUM 40 MG PO TBEC: 40.0000 mg | DELAYED_RELEASE_TABLET | Freq: Every day | ORAL | 0 refills | Status: DC

## 2017-11-23 MED ORDER — NADOLOL 20 MG PO TABS: 40.0000 mg | ORAL_TABLET | Freq: Every day | ORAL | 5 refills | Status: DC

## 2017-11-23 MED ORDER — LACTULOSE 10 GM/15ML PO SOLN: 20.0000 g | Freq: Two times a day (BID) | ORAL | 1 refills | Status: DC

## 2017-11-23 NOTE — Progress Notes (Signed)
Subjective:    Patient ID: Mercedes Mitchell, female    DOB: 1960/08/24, 57 y.o.   MRN: [id number]  HPI Mercedes Mitchell is a 57 year old female with a history of decompensated alcoholic cirrhosis with portal hypertension, history of alcoholic hepatitis now off steroid therapy, history of hepatic encephalopathy, small esophageal varices, portal hypertensive gastropathy, history of erosive esophagitis, history of right lower extremity DVT now off anticoagulation who is here for follow-up.  She is here alone today and was last seen at the time of her screening upper endoscopy and screening colonoscopy on 09/07/2017.  She also was seen by hematology at my request after elevated calcium and abnormal SPEP was discovered.  Fortunately there was no evidence of multiple myeloma.  Her upper endoscopy revealed grade 1 varices in the lower esophagus.  Esophagitis had healed.  There was moderate portal hypertensive gastropathy in the proximal stomach.  No stomach varices.  Normal duodenum.  Colonoscopy revealed a 6 mm descending polyp which was removed and hyperplastic.  Multiple diverticula in the left and right colon and external hemorrhoids.  After small varices and gastropathy was found she was started on nadolol 20 mg a day.  She reports she is feeling great she is tolerating her current medications.  She has had no desire for drinking and has not drank in now 11 months.  Today is her 39-monthanniversary with no alcohol.  No issues with abdominal swelling or lower extremity edema.  No issues with confusion.  No issues with jaundice.  No abdominal pain.  She continues with nadolol 20 mg daily, pantoprazole 40 mg twice daily, Lasix 40 mg daily, spironolactone 100 mg daily and lactulose 30 mL's twice daily.  She reports backing off of her lactulose occasionally when she develops loose stools but now she is taking it twice daily and having 2-3 formed stools per day.  No blood in her stool or melena.  She and her  husband got a rescue puppy who is now 431 monthsold and this is bringing her great joy.  There are involved with their 2 adult sons, [place]  Her 2nd son lives locally.  Review of Systems  As per HPI, otherwise negative   Current Medications, Allergies, Past Medical History, Past Surgical History, Family History and Social History were reviewed in [employer].   Objective:   Physical Exam BP 110/62   Pulse 92   Ht '5\' 6"'  (1.676 m)   Wt 118 lb 9.6 oz (53.8 kg)   BMI 19.14 kg/m  Constitutional: Well-developed and well-nourished. No distress. HEENT: Normocephalic and atraumatic. Oropharynx is clear and moist. Conjunctivae are normal.  No scleral icterus. Neck: Neck supple. Trachea midline. Cardiovascular: Normal rate, regular rhythm and intact distal pulses. No M/R/G Pulmonary/chest: Effort normal and breath sounds normal. No wheezing, rales or rhonchi. Abdominal: Soft, nontender, nondistended. Bowel sounds active throughout.  Extremities: no clubbing, cyanosis, or edema Neurological: Alert and oriented to person place and time.  No asterixis Skin: Skin is warm and dry. Psychiatric: Normal mood and affect. Behavior is normal.     Assessment & Plan:  57year old female with a history of decompensated alcoholic cirrhosis with portal hypertension, history of alcoholic hepatitis now off steroid therapy, history of hepatic encephalopathy, small esophageal varices, portal hypertensive gastropathy, history of erosive esophagitis, history of right lower extremity DVT now off anticoagulation who is here for follow-up.  1. Cirrhosis with portal HTN (ETOH-induced) --  clinically she is doing very well and her disease is much more compensated at this point.  She has been alcohol free for 11 months and seems to have no intention to ever drink again.  Her spirits are brighter and she is feeling well. --For small varices and  portal gastropathy she has been started on beta-blocker.  I took her pulse and it was resting at 76 so I am increasing nadolol to 40 mg daily.  With beta-blocker should not need surveillance EGD, unless decompensation --Had [place] screening by CT scan in December 2018, ultrasound recommended December 2019 --Continue relatively low-dose diuretics along with low-sodium diet.  Continue furosemide 40 mg and spironolactone 100 mg daily --Check CBC, CMP and INR today --No evidence for hepatic encephalopathy today, continue lactulose 30 mL's twice daily  2.  CRC screening --hyperplastic polyp only recently, repeat colonoscopy January 2029  3.  Elevated calcium/abnormal SPEP --appreciate hematology involvement and opinion.  No evidence for multiple myeloma.  Calcium has normalized.  This does not need further evaluation.  28-monthfollow-up, sooner if needed 25 minutes spent with the patient today. Greater than 50% was spent in counseling and coordination of care with the patient

## 2017-11-23 NOTE — Patient Instructions (Signed)
If you are age 56 or older, your body mass index should be between 23-30. Your Body mass index is 19.14 kg/m. If this is out of the aforementioned range listed, please consider follow up with your Primary Care Provider.  If you are age 51 or younger, your body mass index should be between 19-25. Your Body mass index is 19.14 kg/m. If this is out of the aformentioned range listed, please consider follow up with your Primary Care Provider.   Increase your Nadolol to 40mg  daily. Decrease your Pantoprazole to 40mg  daily. Continue taking your Lactulose 60ml twice a day. Continue taking your Lasix 40mg  daily.  Continue Aldactone 100mg  daily.  Your physician has requested that you go to the basement for the following lab work before leaving today: CBC CMP PT/INR  Thank you for choosing [facility], Dr. Hilarie Fredrickson

## 2017-11-30 ENCOUNTER — Ambulatory Visit (HOSPITAL_COMMUNITY): Payer: Self-pay | Admitting: Licensed Clinical Social Worker

## 2017-12-05 ENCOUNTER — Telehealth: Payer: Self-pay | Admitting: Internal Medicine

## 2017-12-05 MED ORDER — NADOLOL 20 MG PO TABS: 40.0000 mg | ORAL_TABLET | Freq: Every day | ORAL | 5 refills | Status: DC

## 2017-12-05 NOTE — Telephone Encounter (Signed)
Rx sent 

## 2017-12-07 ENCOUNTER — Ambulatory Visit (HOSPITAL_COMMUNITY): Payer: Self-pay | Admitting: Licensed Clinical Social Worker

## 2017-12-14 ENCOUNTER — Ambulatory Visit (HOSPITAL_COMMUNITY): Payer: Self-pay | Admitting: Licensed Clinical Social Worker

## 2017-12-14 ENCOUNTER — Inpatient Hospital Stay: Admission: RE | Admit: 2017-12-14 | Payer: Self-pay | Source: Ambulatory Visit

## 2017-12-14 ENCOUNTER — Other Ambulatory Visit: Payer: Self-pay

## 2017-12-21 ENCOUNTER — Ambulatory Visit (HOSPITAL_COMMUNITY): Payer: Self-pay | Admitting: Licensed Clinical Social Worker

## 2017-12-22 ENCOUNTER — Ambulatory Visit: Payer: Self-pay

## 2017-12-22 ENCOUNTER — Ambulatory Visit: Payer: Managed Care, Other (non HMO) | Admitting: Family Medicine

## 2017-12-22 ENCOUNTER — Encounter: Payer: Self-pay | Admitting: Family Medicine

## 2017-12-22 VITALS — BP 102/78 | HR 66 | Temp 98.1°F | Wt 119.0 lb

## 2017-12-22 DIAGNOSIS — W57XXXA Bitten or stung by nonvenomous insect and other nonvenomous arthropods, initial encounter: Secondary | ICD-10-CM | POA: Diagnosis not present

## 2017-12-22 DIAGNOSIS — S30861A Insect bite (nonvenomous) of abdominal wall, initial encounter: Secondary | ICD-10-CM

## 2017-12-22 MED ORDER — DOXYCYCLINE HYCLATE 100 MG PO TABS: 100.0000 mg | ORAL_TABLET | Freq: Two times a day (BID) | ORAL | 0 refills | Status: AC

## 2017-12-22 NOTE — Progress Notes (Signed)
Subjective:    Patient ID: Mercedes Mitchell, female    DOB: 13-Jul-1961, 57 y.o.   MRN: [id number]  No chief complaint on file.   HPI Patient was seen today for acute concern.  Pt endorses finding a tick on her stomach last night.  Pt states she went to scratch her abdomen and felt the tick.  Her husband removed the tick by heating a paperclip with a lighter.  Pt states the entire tick was removed.  Pt notes she often walks her dogs in the woods.  She does not wear insect repellant.  Past Medical History:  Diagnosis Date  . 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  paracentesis 2.6 liters 5/4: no SBP.    Marland Kitchen Alcoholism ([place]) 12/2016  . Candida esophagitis ([place])   . Cirrhosis ([place])   . Diverticulosis   . DVT (deep venous thrombosis) ([place])   . Dysphagia 2011  . Esophageal varices ([place])   . Esophagitis   . External hemorrhoids   . Hepatic steatosis   . Hiatal hernia   . Hyperplastic colonic polyp   . Macrocytic anemia 12/2016  . Malnutrition ([place]) 12/2016  . Panic type anxiety neurosis 2011  . Portal hypertension ([place])   . Portal hypertensive gastropathy (HCC)     Allergies  Allergen Reactions  . Adhesive [Tape]     ROS General: Denies fever, chills, night sweats, changes in weight, changes in appetite HEENT: Denies headaches, ear pain, changes in vision, rhinorrhea, sore throat CV: Denies CP, palpitations, SOB, orthopnea Pulm: Denies SOB, cough, wheezing GI: Denies abdominal pain, nausea, vomiting, diarrhea, constipation GU: Denies dysuria, hematuria, frequency, vaginal discharge Msk: Denies muscle cramps, joint pains Neuro: Denies weakness, numbness, tingling Skin: Denies rashes, bruising  +tick bite Psych: Denies depression, anxiety, hallucinations     Objective:    Blood pressure 102/78, pulse 66, temperature 98.1 F (36.7 C), temperature source Oral, weight 119 lb (54 kg), SpO2 96 %.   Gen.  Pleasant, well-nourished, in no distress, normal affect   HEENT: Long/AT, face symmetric, no scleral icterus, PERRLA, nares patent without drainage Lungs: no accessory muscle use, CTAB, no wheezes or rales Cardiovascular: RRR, no m/r/g, no peripheral edema Neuro:  A&Ox3, CN II-XII intact, normal gait Skin:  Warm, dry, intact.  A small puncture on lower R abdomen with surrounding erythema, no edema, or induration.   Wt Readings from Last 3 Encounters:  12/22/17 119 lb (54 kg)  11/23/17 118 lb 9.6 oz (53.8 kg)  10/04/17 123 lb 2 oz (55.8 kg)    Lab Results  Component Value Date   WBC 7.3 11/23/2017   HGB 14.7 11/23/2017   HCT 42.4 11/23/2017   PLT 237.0 11/23/2017   GLUCOSE 77 11/23/2017   CHOL 196 10/04/2017   TRIG 174.0 (H) 10/04/2017   HDL 33.00 (L) 10/04/2017   LDLDIRECT 146.3 03/25/2010   LDLCALC 128 (H) 10/04/2017   ALT 26 11/23/2017   AST 38 (H) 11/23/2017   NA 138 11/23/2017   K 4.2 11/23/2017   CL 99 11/23/2017   CREATININE 1.00 11/23/2017   BUN 12 11/23/2017   CO2 30 11/23/2017   TSH 1.77 08/31/2017   INR 1.2 (H) 11/23/2017   HGBA1C 5.8 10/04/2017    Assessment/Plan:  Tick bite of abdomen, initial encounter  -pt given handout -advised to wear inscect repellant/long sleeves when walking her dogs in the woods. - Plan: doxycycline (VIBRA-TABS) 100 MG tablet -  f/u prn   Grier Mitts, MD

## 2017-12-22 NOTE — Patient Instructions (Signed)

## 2017-12-22 NOTE — Telephone Encounter (Signed)
Patient called in with c/o "tick bite." She says "I found it Wednesday morning when I woke up. Below my belly button about 3 inches to the right is where I pulled it off. It was attached pretty good and I used heat to release it. It itches, mildly, and it is red around the bite and raised. The tick was brown with white on the back. The area where the bite is located is raised and there is a red ring around the bite mark about 2 inches across and round. The swelling is about the size of a silver dollar." I asked about pain or any other symptoms, she says "no, just tender to the site and no other symptoms." According to protocol, see PCP within 24 hours, no availability with PCP, appointment scheduled for today at 1030 with Dr. Volanda Napoleon, care advice given, patient verbalized understanding.   Reason for Disposition . [1] Red or very tender (to touch) area AND [2] started over 24 hours after the bite  Answer Assessment - Initial Assessment Questions 1. TYPE of INSECT: "What type of insect was it?"      Tick, brown with white spot on back 2. ONSET: "When did you get bitten?"      Yesterday 3. LOCATION: "Where is the insect bite located?"       Below belly button 4. REDNESS: "Is the area red or pink?" If so, ask "What size is area of redness?" (inches or cm). "When did the redness start?"     Red ring around the bite, raised, thumbtack size, red area is 2 inches across and round 5. PAIN: "Is there any pain?" If so, ask: "How bad is it?"  (Scale 1-10; or mild, moderate, severe)     No, but tender to touch 6. ITCHING: "Does it itch?" If so, ask: "How bad is the itch?"    - MILD: doesn't interfere with normal activities   - MODERATE-SEVERE: interferes with work, school, sleep, or other activities      Mild 7. SWELLING: "How big is the swelling?" (inches, cm, or compare to coins)     Swelling about the size of a silver dollar 8. OTHER SYMPTOMS: "Do you have any other symptoms?"  (e.g., difficulty  breathing, hives)     No 9. PREGNANCY: "Is there any chance you are pregnant?" "When was your last menstrual period?"     No  Answer Assessment - Initial Assessment Questions 1. TYPE of TICK: "Is it a wood tick or a deer tick?" If unsure, ask: "What size was the tick?" "Did it look more like a watermelon seed or a poppy seed?"      It looks like the picture of a loan star tick-brown with white on back 2. LOCATION: "Where is the tick bite located?"      Below belly button on the right side 3. ONSET: "How long do you think the tick was attached before you removed it?" (Hours or days)      Must have been there during the night 2 nights, probably a few hours, found yesterday morning 4. TETANUS: "When was the last tetanus booster?"      Within last year 5. PREGNANCY: "Is there any chance you are pregnant?" "When was your last menstrual period?"    No  Protocols used: TICK BITE-A-AH, INSECT BITE-A-AH

## 2017-12-26 ENCOUNTER — Other Ambulatory Visit: Payer: Self-pay | Admitting: Family Medicine

## 2017-12-26 DIAGNOSIS — G4700 Insomnia, unspecified: Secondary | ICD-10-CM

## 2018-01-04 IMAGING — DX DG CHEST 2V
2 series · 2 of 2 positions shown · non-contrast
Comparison: 05/08/2017, 03/26/2017

CLINICAL DATA: Cough congestion shortness of breath and low-grade
fever

EXAM:
CHEST  2 VIEW

[chest pa]
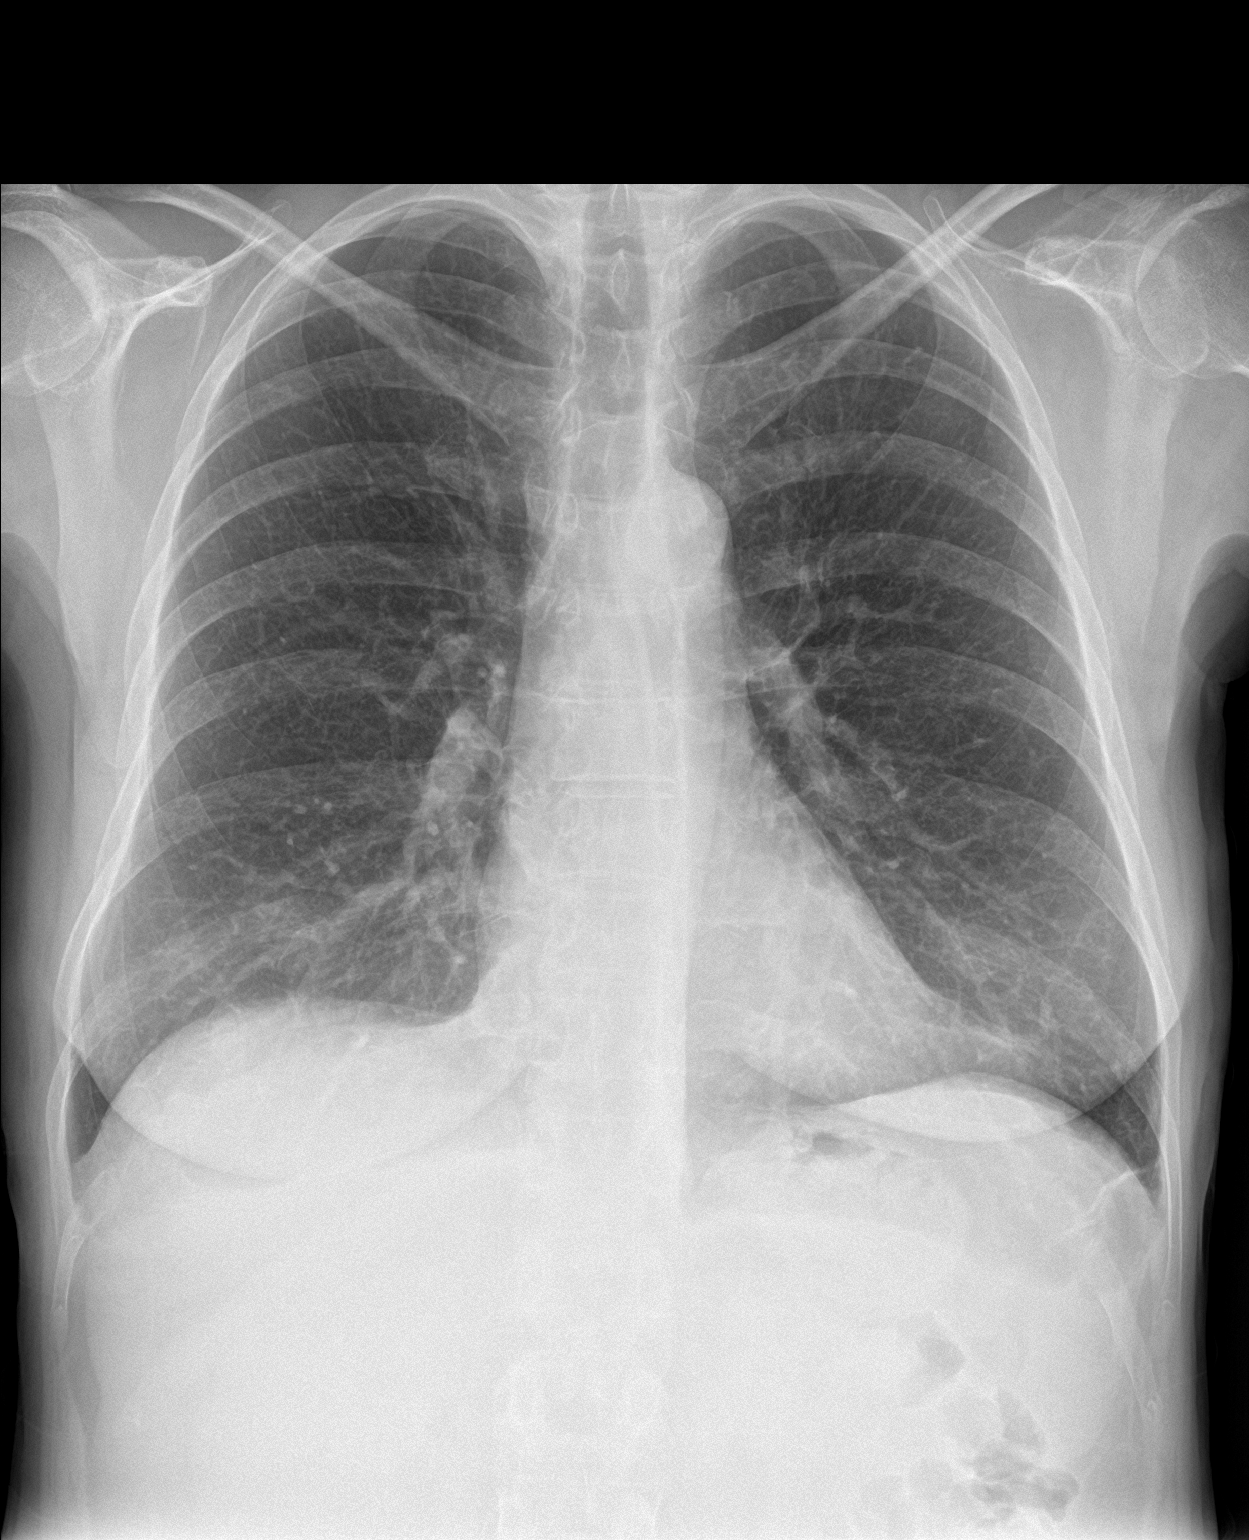

[chest lat]
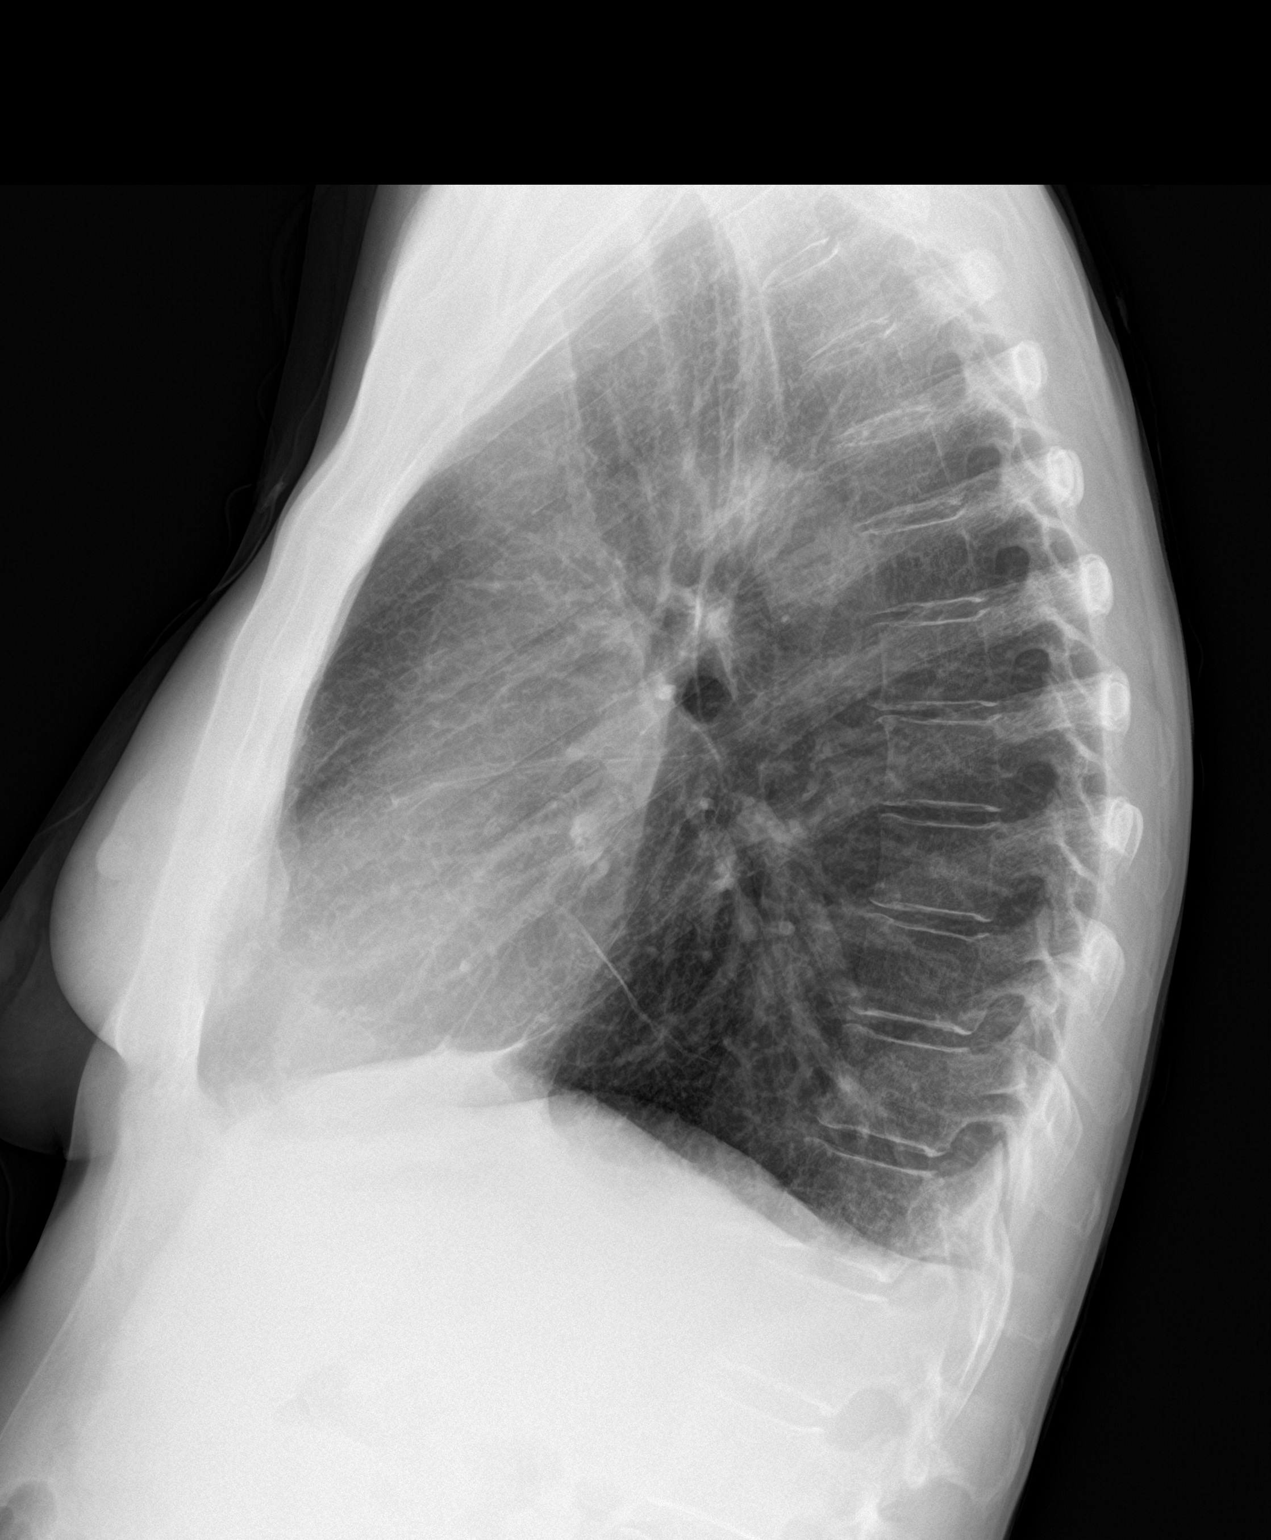

[2 of 2 positions shown; findings below may reference images not displayed]

FINDINGS: Linear scarring at the left base. Minimal left lung base and
lingular opacity. Trace pleural effusion on the lateral view
unchanged. Normal heart size. No pneumothorax.
IMPRESSION: 1. Trace pleural effusions unchanged. Small foci of atelectasis,
less likely infiltrate at the lingula and left base

## 2018-01-09 ENCOUNTER — Telehealth: Payer: Self-pay | Admitting: Internal Medicine

## 2018-01-09 MED ORDER — NADOLOL 20 MG PO TABS: 40.0000 mg | ORAL_TABLET | Freq: Every day | ORAL | 0 refills | Status: DC

## 2018-01-09 NOTE — Telephone Encounter (Signed)
Patient wanting to know if she could get medication [facility]

## 2018-01-09 NOTE — Telephone Encounter (Signed)
3 month rx sent to [facility] for nadolol.

## 2018-01-18 ENCOUNTER — Other Ambulatory Visit: Payer: Self-pay

## 2018-02-13 ENCOUNTER — Telehealth: Payer: Self-pay | Admitting: Family Medicine

## 2018-02-13 DIAGNOSIS — G4700 Insomnia, unspecified: Secondary | ICD-10-CM

## 2018-02-13 NOTE — Telephone Encounter (Signed)
Copied from [place] [id number]. Topic: Quick Communication - Rx Refill/Question >> Feb 13, 2018 11:32 AM Marval Regal L wrote: Medication: QUEtiapine (SEROQUEL) 25 MG tablet Pt called and stated that she would like for the 50mg  be called.  Betty Martinique had suggested 50mg  per pt. Please advise   Has the patient contacted their pharmacy? {no  Preferred Pharmacy (with phone number or street name): [facility] [address] [place] [phone]  Agent: Please be advised that RX refills may take up to 3 business days. We ask that you follow-up with your pharmacy.

## 2018-02-13 NOTE — Telephone Encounter (Signed)
Copied from [place] [id number]. Topic: Quick Communication - Rx Refill/Question >> Feb 13, 2018 11:32 AM Marval Regal L wrote: Medication: QUEtiapine (SEROQUEL) 25 MG tablet Pt called and stated that she would like for the 50mg  be called.  Betty Martinique had suggested 50mg  per pt. Please advise   Has the patient contacted their pharmacy? {no  Preferred Pharmacy (with phone number or street name): [facility] [address] [place] [phone] (Phone ***  Agent: Please be advised that RX refills may take up to 3 business days. We ask that you follow-up with your pharmacy.

## 2018-02-14 NOTE — Telephone Encounter (Signed)
Pt is currently taking two 25mg  tabs. Would like to take one 50 mg tab.  Please advise.

## 2018-02-16 NOTE — Telephone Encounter (Signed)
Pt is calling to f/up on this request.  °

## 2018-02-17 MED ORDER — QUETIAPINE FUMARATE 50 MG PO TABS: 50.0000 mg | ORAL_TABLET | Freq: Every day | ORAL | 0 refills | Status: DC

## 2018-02-17 NOTE — Telephone Encounter (Signed)
Please advise Dr Martinique. Thanks.  Pt going out of town and needs this refilled today.

## 2018-02-17 NOTE — Telephone Encounter (Signed)
Pt aware of recommendations per Dr Martinique.  Aware that a 30-day supply of Seroquel 50mg  tablets will be sent to [facility].  Pt states that she will schedule a follow up appt with Dr Martinique when she returns from out of town.   Nothing further needed.

## 2018-02-17 NOTE — Telephone Encounter (Signed)
We have not appropriately followed on this problem. [Medication was started while she was following with group therapy.] It is okay to continue Seroquel 50 mg at bedtime. 30-day supply can be sent to her pharmacy but she needs to arrange a follow-up appointment.  Thanks, BJ

## 2018-03-02 ENCOUNTER — Inpatient Hospital Stay: Admission: RE | Admit: 2018-03-02 | Payer: Self-pay | Source: Ambulatory Visit

## 2018-03-02 ENCOUNTER — Telehealth: Payer: Self-pay | Admitting: Internal Medicine

## 2018-03-02 ENCOUNTER — Other Ambulatory Visit: Payer: Self-pay

## 2018-03-02 NOTE — Telephone Encounter (Signed)
Spoke with pt and she is aware.

## 2018-03-02 NOTE — Telephone Encounter (Signed)
At this point I recommend that she maintain the relationship with [facility] (though she has been doing great, in the event of future decompensation when we may need their help again). She had a CT abd in 07/2017 -- cross-sectional imaging (like CT) is generally good for 1 year, thus I would recommend annual CT, or semi-annual [place].  Thus, would resume [place] for [facility] screening in Dec 2019 Thanks

## 2018-03-02 NOTE — Telephone Encounter (Signed)
Dr. Hilarie Fredrickson please see below and advise.

## 2018-03-06 ENCOUNTER — Other Ambulatory Visit: Payer: Self-pay | Admitting: Nurse Practitioner

## 2018-03-06 DIAGNOSIS — K7030 Alcoholic cirrhosis of liver without ascites: Secondary | ICD-10-CM

## 2018-03-20 ENCOUNTER — Other Ambulatory Visit: Payer: Self-pay | Admitting: Family Medicine

## 2018-03-20 DIAGNOSIS — G4700 Insomnia, unspecified: Secondary | ICD-10-CM

## 2018-03-21 ENCOUNTER — Encounter: Payer: Self-pay | Admitting: Family Medicine

## 2018-03-21 ENCOUNTER — Ambulatory Visit: Payer: Managed Care, Other (non HMO) | Admitting: Family Medicine

## 2018-03-21 VITALS — BP 110/66 | HR 78 | Temp 97.9°F | Resp 12 | Ht 66.0 in | Wt 119.5 lb

## 2018-03-21 DIAGNOSIS — F320 Major depressive disorder, single episode, mild: Secondary | ICD-10-CM | POA: Diagnosis not present

## 2018-03-21 DIAGNOSIS — G4700 Insomnia, unspecified: Secondary | ICD-10-CM

## 2018-03-21 DIAGNOSIS — L298 Other pruritus: Secondary | ICD-10-CM

## 2018-03-21 MED ORDER — QUETIAPINE FUMARATE 50 MG PO TABS: 50.0000 mg | ORAL_TABLET | Freq: Every day | ORAL | 2 refills | Status: DC

## 2018-03-21 MED ORDER — QUETIAPINE FUMARATE 50 MG PO TABS: 50.0000 mg | ORAL_TABLET | Freq: Every day | ORAL | 0 refills | Status: DC

## 2018-03-21 MED ORDER — TRIAMCINOLONE ACETONIDE 0.1 % EX CREA: 1.0000 "application " | TOPICAL_CREAM | Freq: Two times a day (BID) | CUTANEOUS | 0 refills | Status: AC

## 2018-03-21 NOTE — Assessment & Plan Note (Signed)
Well-controlled with current management, Seroquel 50 mg daily. She is reporting no depressed symptoms since Seroquel was started, so I think is appropriate to follow annually.  She knows to arrange appointment sooner if needed.

## 2018-03-21 NOTE — Assessment & Plan Note (Signed)
It seems to be well controlled with Seroquel 50 mg at bedtime. Good sleep hygiene also recommended. I think at this time she can continue following annually, before if needed.

## 2018-03-21 NOTE — Patient Instructions (Signed)
A few things to remember from today's visit:   Pruritic erythematous rash  Insomnia, unspecified type  Topical steroid twice daily for up to 14 days. Over-the-counter antihistaminic as Zyrtec 10 mg daily may also help. Keep fingernails clean and avoid scratching lesions to decrease the risk of infection.  Please be sure medication list is accurate. If a new problem present, please set up appointment sooner than planned today.

## 2018-03-21 NOTE — Progress Notes (Signed)
ACUTE VISIT   HPI:  Chief Complaint  Patient presents with  . Rash    started 1 week ago, red and itching    [name] is a 57 y.o. female, who is here today complaining of a week of pruritic skin rash that she noted initially on buttocks, then arms, thighs, and back. Lesions resolve spontaneously but new lesions develop. She has tried OTC Neosporin.  She has not identified exacerbating or alleviating factors.  Negative for new medication, detergent, soap, or body product. No known outdoor exposures to plants. Reporting tick bite in 12/2017,she was treated with Doxycycline.  No sick contact. No Hx of eczema or similar rash in the past.   Negative for oral lesions/edema,cough, wheezing, dyspnea, abdominal pain, nausea, or vomiting.   Depression and insomnia: I have been refilling Seroquel, initially 25 mg which she was taking 1-2 at bedtime and recently she requested Seroquel 50 mg tablet. She completed alcoholism treatment, currently she is not following with counselor.Last visit was  According to patient, medication was started to treat depression and insomnia. She has not noted side effects. Denies suicidal thoughts. She reports the medication is helping with both, depression and insomnia. She feels rested next day.    Review of Systems  Constitutional: Negative for appetite change, chills and fever.  HENT: Negative for mouth sores and sore throat.   Respiratory: Negative for cough, shortness of breath and wheezing.   Cardiovascular: Negative for chest pain, palpitations and leg swelling.  Gastrointestinal: Negative for abdominal pain, diarrhea, nausea and vomiting.  Musculoskeletal: Negative for joint swelling and myalgias.  Skin: Positive for rash. Negative for wound.  Allergic/Immunologic: Negative for environmental allergies.  Neurological: Negative for weakness and numbness.  Psychiatric/Behavioral: Positive for sleep disturbance. Negative  for confusion. The patient is nervous/anxious.       Current Outpatient Medications on File Prior to Visit  Medication Sig Dispense Refill  . folic acid (FOLVITE) 1 MG tablet Take 1 tablet (1 mg total) by mouth daily. 100 tablet 5  . furosemide (LASIX) 20 MG tablet Take 2 tablets (40 mg total) by mouth daily. 30 tablet   . lactulose (CHRONULAC) 10 GM/15ML solution Take 30 mLs (20 g total) by mouth 2 (two) times daily. 1800 mL 1  . Multiple Vitamin (MULTIVITAMIN WITH MINERALS) TABS tablet Take 1 tablet by mouth daily. 30 tablet 0  . nadolol (CORGARD) 20 MG tablet Take 2 tablets (40 mg total) by mouth daily. 180 tablet 0  . pantoprazole (PROTONIX) 40 MG tablet Take 1 tablet (40 mg total) by mouth daily. 180 tablet 0  . spironolactone (ALDACTONE) 100 MG tablet Take 100 mg by mouth daily.     No current facility-administered medications on file prior to visit.      Past Medical History:  Diagnosis Date  . 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  paracentesis 2.6 liters 5/4: no SBP.    Marland Kitchen Alcoholism ([place]) 12/2016  . Candida esophagitis ([place])   . Cirrhosis ([place])   . Diverticulosis   . DVT (deep venous thrombosis) ([place])   . Dysphagia 2011  . Esophageal varices ([place])   . Esophagitis   . External hemorrhoids   . Hepatic steatosis   . Hiatal hernia   . Hyperplastic colonic polyp   . Macrocytic anemia 12/2016  . Malnutrition ([place]) 12/2016  . Panic type anxiety neurosis 2011  . Portal hypertension ([place])   .  Portal hypertensive gastropathy (HCC)    Allergies  Allergen Reactions  . Adhesive [Tape]     Social History   Socioeconomic History  . Marital status: Married    Spouse name: Not on file  . Number of children: Not on file  . Years of education: Not on file  . Highest education level: Not on file  Occupational History  . Occupation: Unemployed  Social Needs  . Financial resource strain: Not on file  .  Food insecurity:    Worry: Not on file    Inability: Not on file  . Transportation needs:    Medical: Not on file    Non-medical: Not on file  Tobacco Use  . Smoking status: Former Smoker    Types: Cigarettes  . Smokeless tobacco: Never Used  . Tobacco comment: Reports she quit "about 6 months ago"  Substance and Sexual Activity  . Alcohol use: Yes    Comment: Last drink: 6/18  . Drug use: No    Types: Cocaine, Marijuana    Comment: Last use Cocaine 11/17, reports last use "over a year", [facility] 2/18, reports "a year" ago  . Sexual activity: Not on file  Lifestyle  . Physical activity:    Days per week: Not on file    Minutes per session: Not on file  . Stress: Not on file  Relationships  . Social connections:    Talks on phone: Not on file    Gets together: Not on file    Attends religious service: Not on file    Active member of club or organization: Not on file    Attends meetings of clubs or organizations: Not on file    Relationship status: Not on file  Other Topics Concern  . Not on file  Social History Narrative   Married, children, not working    Vitals:   03/21/18 1433  BP: 110/66  Pulse: 78  Resp: 12  Temp: 97.9 F (36.6 C)  SpO2: 96%   Body mass index is 19.29 kg/m.   Physical Exam  Nursing note and vitals reviewed. Constitutional: She is oriented to person, place, and time. She appears well-developed. No distress.  HENT:  Head: Normocephalic and atraumatic.  Mouth/Throat: Oropharynx is clear and moist and mucous membranes are normal.  Eyes: Conjunctivae are normal.  Cardiovascular: Normal rate and regular rhythm.  No murmur heard. Respiratory: Effort normal and breath sounds normal. No respiratory distress.  Musculoskeletal: She exhibits no edema or tenderness.  Lymphadenopathy:    She has no cervical adenopathy.  Neurological: She is alert and oriented to person, place, and time. She has normal strength.  Stable gait without assistance.     Skin: Skin is warm. Rash noted. Rash is papular. Rash is not vesicular.  Papular erythematosus rash on arms,one on right arm and 2 on left. Mid back x 1. Healed lesions on thighs.  Psychiatric: She has a normal mood and affect. Her speech is normal.  Well groomed, good eye contact.    ASSESSMENT AND PLAN:   Ms. Tamieka was seen today for rash.  Diagnoses and all orders for this visit:  Pruritic erythematous rash  We discussed possible differential diagnosis. For now we will treat with topical steroid cream, recommend using small amount at the time twice daily for up to 14 days. OTC Zyrtec 10 mg may also help. Monitor for new associated symptoms. If problem is persistent we may need to arrange appointment with dermatologist.  -  triamcinolone cream (KENALOG) 0.1 %; Apply 1 application topically 2 (two) times daily for 14 days.   Insomnia disorder It seems to be well controlled with Seroquel 50 mg at bedtime. Good sleep hygiene also recommended. I think at this time she can continue following annually, before if needed.  Depression, major, single episode, mild (HCC) Well-controlled with current management, Seroquel 50 mg daily. She is reporting no depressed symptoms since Seroquel was started, so I think is appropriate to follow annually.  She knows to arrange appointment sooner if needed.   25 min face to face OV. > 50% was dedicated to discussion of differential Dx's, treatment options, and side effects of medications.  She also has some questions about folic acid, which was recommended over a year ago when she was first hospitalized due to alcohol abuse and alcoholic acute hepatitis.  She has discontinued multivitamins and folic acid.  She is following a healthy diet with plenty of vegetables and fruits and last CBC was in normal range.  Lab Results  Component Value Date   WBC 7.3 11/23/2017   HGB 14.7 11/23/2017   HCT 42.4 11/23/2017   MCV 90.2 11/23/2017   PLT 237.0  11/23/2017    For now she would not resume folic acid and will keep appointment with GI in 05/2018.   Return in about 7 months (around 10/23/2018), or if symptoms worsen or fail to improve, for cpe.     Emylie Amster G. Martinique, MD  [facility]. [place] office.

## 2018-03-22 ENCOUNTER — Other Ambulatory Visit: Payer: Self-pay | Admitting: Internal Medicine

## 2018-03-27 ENCOUNTER — Telehealth: Payer: Self-pay | Admitting: Family Medicine

## 2018-03-27 NOTE — Telephone Encounter (Signed)
Call to pharmacy- they are going to release her medication. Left message for patient- call if she has any problems with medication.

## 2018-03-27 NOTE — Telephone Encounter (Signed)
Copied from [place] [id number]. Topic: Quick Communication - Rx Refill/Question >> Mar 27, 2018 11:25 AM Keene Breath wrote: Medication: QUEtiapine (SEROQUEL) 50 MG tablet  Patient called to request a refill for the above medication.  Patient has already seen doctor.  CB# [phone]  Preferred Pharmacy (with phone number or street name): [place], [place] [phone] (Phone) [phone] (Fax)

## 2018-04-06 ENCOUNTER — Telehealth: Payer: Self-pay | Admitting: Family Medicine

## 2018-04-06 NOTE — Telephone Encounter (Signed)
Copied from [place] [id number]. Topic: Quick Communication - Rx Refill/Question >> Apr 06, 2018 12:12 PM Waldemar Dickens, Sade R wrote: Medication: triamcinolone cream (KENALOG) 0.1 %  Has the patient contacted their pharmacy? No, Pt states she broke out again and left the med in a hotel on vacation and was wanting another tube  Preferred Pharmacy (with phone number or street name): [facility] [address] [place] - [address] [phone] (Phone) [phone] (Fax)

## 2018-04-06 NOTE — Telephone Encounter (Signed)
Left detailed message informing pt of update.   According to Dr.Jordan's notes 03/21/2018, the pt is to use the cream for up to 14 days. Pt has surpass those days. Pt was advise via vm that she can also take OTC antihistaminic as Zyrtec to help. Message will be forwarded to Dr. Martinique for further assessment.

## 2018-04-06 NOTE — Telephone Encounter (Signed)
Pt saw Dr. Martinique 03/21/18 for rash, RXed  triamcinolone cream (KENALOG) 0.1 %   Pt states she has broken out again but has left the med in a hotel. Requesting refill:  [facility] [address], [place]   [phone] (Phone) [phone] (Fax)

## 2018-04-07 ENCOUNTER — Other Ambulatory Visit: Payer: Self-pay | Admitting: Family Medicine

## 2018-04-07 MED ORDER — TRIAMCINOLONE ACETONIDE 0.1 % EX CREA: 1.0000 "application " | TOPICAL_CREAM | Freq: Two times a day (BID) | CUTANEOUS | 0 refills | Status: AC

## 2018-04-07 NOTE — Telephone Encounter (Signed)
Left detailed message informing pt of update. 

## 2018-04-07 NOTE — Telephone Encounter (Signed)
Prescription for triamcinolone cream 0 0.1% was sent to her pharmacy. We discussed during visit that she may need to see dermatologist if rash does not resolve.  Thanks, BJ

## 2018-04-13 ENCOUNTER — Other Ambulatory Visit: Payer: Self-pay

## 2018-05-03 ENCOUNTER — Ambulatory Visit: Payer: Managed Care, Other (non HMO) | Admitting: Family Medicine

## 2018-05-05 ENCOUNTER — Ambulatory Visit: Payer: Managed Care, Other (non HMO) | Admitting: Family Medicine

## 2018-05-19 ENCOUNTER — Encounter: Payer: Self-pay | Admitting: Adult Health

## 2018-05-19 ENCOUNTER — Ambulatory Visit: Payer: Managed Care, Other (non HMO) | Admitting: Adult Health

## 2018-05-19 VITALS — BP 70/50 | HR 67 | Temp 98.1°F | Ht 66.0 in | Wt 125.6 lb

## 2018-05-19 DIAGNOSIS — B001 Herpesviral vesicular dermatitis: Secondary | ICD-10-CM

## 2018-05-19 DIAGNOSIS — K047 Periapical abscess without sinus: Secondary | ICD-10-CM | POA: Diagnosis not present

## 2018-05-19 DIAGNOSIS — J0140 Acute pansinusitis, unspecified: Secondary | ICD-10-CM

## 2018-05-19 MED ORDER — AMOXICILLIN-POT CLAVULANATE 875-125 MG PO TABS: 1.0000 | ORAL_TABLET | Freq: Two times a day (BID) | ORAL | 0 refills | Status: DC

## 2018-05-19 MED ORDER — VALACYCLOVIR HCL 1 G PO TABS: 1000.0000 mg | ORAL_TABLET | Freq: Two times a day (BID) | ORAL | 0 refills | Status: DC

## 2018-05-19 NOTE — Progress Notes (Signed)
Subjective:    Patient ID: Mercedes Mitchell, female    DOB: 07/03/1961, 57 y.o.   MRN: [id number]  URI   This is a new problem. The current episode started 1 to 4 weeks ago (2 weeks ). The problem has been unchanged. There has been no fever. Associated symptoms include congestion, headaches, sinus pain, a sore throat and swollen glands. Pertinent negatives include no coughing, dysuria, ear pain, joint swelling, plugged ear sensation or rash. She has tried decongestant for the symptoms. The treatment provided no relief.   Also reports cold sore on left upper lip. Has been using Abreva without improvement. Cold sore has been present for a few days. Has used Valtrex in the past but is out of medication    Review of Systems  Constitutional: Positive for fatigue.  HENT: Positive for congestion, sinus pressure, sinus pain and sore throat. Negative for ear pain.   Respiratory: Negative for cough.   Genitourinary: Negative for dysuria.  Skin: Negative for rash.  Neurological: Positive for headaches.   Past Medical History:  Diagnosis Date  . 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  paracentesis 2.6 liters 5/4: no SBP.    Marland Kitchen Alcoholism ([place]) 12/2016  . Candida esophagitis ([place])   . Cirrhosis ([place])   . Diverticulosis   . DVT (deep venous thrombosis) ([place])   . Dysphagia 2011  . Esophageal varices ([place])   . Esophagitis   . External hemorrhoids   . Hepatic steatosis   . Hiatal hernia   . Hyperplastic colonic polyp   . Macrocytic anemia 12/2016  . Malnutrition ([place]) 12/2016  . Panic type anxiety neurosis 2011  . Portal hypertension ([place])   . Portal hypertensive gastropathy (H[facility])     Social History   Socioeconomic History  . Marital status: Married    Spouse name: Not on file  . Number of children: Not on file  . Years of education: Not on file  . Highest education level: Not on file  Occupational History  .  Occupation: Unemployed  Social Needs  . Financial resource strain: Not on file  . Food insecurity:    Worry: Not on file    Inability: Not on file  . Transportation needs:    Medical: Not on file    Non-medical: Not on file  Tobacco Use  . Smoking status: Former Smoker    Types: Cigarettes  . Smokeless tobacco: Never Used  . Tobacco comment: Reports she quit "about 6 months ago"  Substance and Sexual Activity  . Alcohol use: Yes    Comment: Last drink: 6/18  . Drug use: No    Types: Cocaine, Marijuana    Comment: Last use Cocaine 11/17, reports last use "over a year", [facility] 2/18, reports "a year" ago  . Sexual activity: Not on file  Lifestyle  . Physical activity:    Days per week: Not on file    Minutes per session: Not on file  . Stress: Not on file  Relationships  . Social connections:    Talks on phone: Not on file    Gets together: Not on file    Attends religious service: Not on file    Active member of club or organization: Not on file    Attends meetings of clubs or organizations: Not on file    Relationship status: Not on file  . Intimate partner violence:    Fear of current  or ex partner: Not on file    Emotionally abused: Not on file    Physically abused: Not on file    Forced sexual activity: Not on file  Other Topics Concern  . Not on file  Social History Narrative   Married, children, not working    Past Surgical History:  Procedure Laterality Date  . BREAST BIOPSY Left 04/2017   benign  . ESOPHAGOGASTRODUODENOSCOPY N/A 12/25/2016   Procedure: ESOPHAGOGASTRODUODENOSCOPY (EGD);  Surgeon: Jerene Bears, MD;  Location: [facility];  Service: Endoscopy;  Laterality: N/A;  . IR PARACENTESIS  02/02/2017  . IR TRANSCATHETER BX  02/02/2017  . IR US GUIDE VASC ACCESS RIGHT  02/02/2017  . IR VENOGRAM HEPATIC W HEMODYNAMIC EVALUATION  02/02/2017    Family History  Problem Relation Age of Onset  . Hyperlipidemia Father   . Heart disease Father   . Alcoholism  Father   . Heart attack Father   . Alcoholism Mother   . Alcoholism Sister     Allergies  Allergen Reactions  . Adhesive [Tape]     Current Outpatient Medications on File Prior to Visit  Medication Sig Dispense Refill  . folic acid (FOLVITE) 1 MG tablet Take 1 tablet (1 mg total) by mouth daily. 100 tablet 5  . furosemide (LASIX) 20 MG tablet Take 2 tablets (40 mg total) by mouth daily. 30 tablet   . lactulose (CHRONULAC) 10 GM/15ML solution Take 30 mLs (20 g total) by mouth 2 (two) times daily. 1800 mL 1  . Multiple Vitamin (MULTIVITAMIN WITH MINERALS) TABS tablet Take 1 tablet by mouth daily. 30 tablet 0  . nadolol (CORGARD) 20 MG tablet TAKE 2 TABLETS DAILY 180 tablet 0  . pantoprazole (PROTONIX) 40 MG tablet Take 1 tablet (40 mg total) by mouth daily. 180 tablet 0  . QUEtiapine (SEROQUEL) 50 MG tablet Take 1 tablet (50 mg total) by mouth at bedtime. 90 tablet 2  . spironolactone (ALDACTONE) 100 MG tablet Take 100 mg by mouth daily.     No current facility-administered medications on file prior to visit.     BP (!) 70/50 (BP Location: Left Arm, Patient Position: Sitting, Cuff Size: Normal)   Pulse 67   Temp 98.1 F (36.7 C) (Oral)   Ht 5\' 6"  (1.676 m)   Wt 125 lb 9.6 oz (57 kg)   SpO2 98%   BMI 20.27 kg/m       Objective:   Physical Exam  Constitutional: She appears well-developed and well-nourished. No distress.  HENT:  Right Ear: Hearing, tympanic membrane, external ear and ear canal normal.  Left Ear: Hearing, tympanic membrane, external ear and ear canal normal.  Nose: Mucosal edema and rhinorrhea present. Right sinus exhibits maxillary sinus tenderness and frontal sinus tenderness. Left sinus exhibits maxillary sinus tenderness and frontal sinus tenderness.  Mouth/Throat: Oropharynx is clear and moist and mucous membranes are normal. Abnormal dentition. Dental abscesses present.    Eyes: Pupils are equal, round, and reactive to light. Conjunctivae and EOM are  normal. Right eye exhibits no discharge. Left eye exhibits no discharge. No scleral icterus.  Neck: Normal range of motion. Neck supple. No JVD present. No tracheal deviation present. No thyromegaly present.  Cardiovascular: Normal rate, regular rhythm, normal heart sounds and intact distal pulses.  Pulmonary/Chest: Effort normal and breath sounds normal.  Abdominal: Soft. Bowel sounds are normal.  Lymphadenopathy:    She has no cervical adenopathy.  Skin: Skin is warm and dry. She is not diaphoretic.  Psychiatric: She has a normal mood and affect. Her behavior is normal. Judgment and thought content normal.  Vitals reviewed.     Assessment & Plan:  1. Acute non-recurrent pansinusitis  - amoxicillin-clavulanate (AUGMENTIN) 875-125 MG tablet; Take 1 tablet by mouth 2 (two) times daily.  Dispense: 20 tablet; Refill: 0 - Follow up if not improved in the next 2-3 days  2. Cold sore  - valACYclovir (VALTREX) 1000 MG tablet; Take 1 tablet (1,000 mg total) by mouth 2 (two) times daily.  Dispense: 20 tablet; Refill: 0  3. Dental abscess - follow up with dentist  - amoxicillin-clavulanate (AUGMENTIN) 875-125 MG tablet; Take 1 tablet by mouth 2 (two) times daily.  Dispense: 20 tablet; Refill: 0  Dorothyann Peng, NP

## 2018-05-30 ENCOUNTER — Other Ambulatory Visit: Payer: Self-pay | Admitting: Nurse Practitioner

## 2018-05-30 DIAGNOSIS — K7030 Alcoholic cirrhosis of liver without ascites: Secondary | ICD-10-CM

## 2018-06-01 ENCOUNTER — Other Ambulatory Visit: Payer: Self-pay

## 2018-06-01 ENCOUNTER — Other Ambulatory Visit: Payer: Self-pay | Admitting: General Surgery

## 2018-06-01 DIAGNOSIS — N6489 Other specified disorders of breast: Secondary | ICD-10-CM

## 2018-06-05 ENCOUNTER — Other Ambulatory Visit: Payer: Self-pay

## 2018-06-05 ENCOUNTER — Inpatient Hospital Stay: Admission: RE | Admit: 2018-06-05 | Payer: Self-pay | Source: Ambulatory Visit

## 2018-06-06 ENCOUNTER — Other Ambulatory Visit: Payer: Self-pay | Admitting: General Surgery

## 2018-06-06 ENCOUNTER — Other Ambulatory Visit: Payer: Self-pay

## 2018-06-06 DIAGNOSIS — N6489 Other specified disorders of breast: Secondary | ICD-10-CM

## 2018-06-08 ENCOUNTER — Ambulatory Visit: Payer: Self-pay

## 2018-06-08 ENCOUNTER — Ambulatory Visit
Admission: RE | Admit: 2018-06-08 | Discharge: 2018-06-08 | Disposition: A | Discharge status: Home / Self Care | Payer: Managed Care, Other (non HMO) | Source: Ambulatory Visit | Attending: General Surgery | Admitting: General Surgery

## 2018-06-08 DIAGNOSIS — N6489 Other specified disorders of breast: Secondary | ICD-10-CM

## 2018-06-09 ENCOUNTER — Ambulatory Visit
Admission: RE | Admit: 2018-06-09 | Discharge: 2018-06-09 | Disposition: A | Discharge status: Home / Self Care | Payer: Managed Care, Other (non HMO) | Source: Ambulatory Visit | Attending: Nurse Practitioner | Admitting: Nurse Practitioner

## 2018-06-09 DIAGNOSIS — K7030 Alcoholic cirrhosis of liver without ascites: Secondary | ICD-10-CM

## 2018-06-20 ENCOUNTER — Other Ambulatory Visit: Payer: Self-pay | Admitting: Internal Medicine

## 2018-06-29 ENCOUNTER — Encounter: Payer: Self-pay | Admitting: Internal Medicine

## 2018-06-29 ENCOUNTER — Ambulatory Visit: Payer: Managed Care, Other (non HMO) | Admitting: Internal Medicine

## 2018-06-29 VITALS — BP 116/70 | HR 74 | Ht 65.5 in | Wt 126.0 lb

## 2018-06-29 DIAGNOSIS — K7030 Alcoholic cirrhosis of liver without ascites: Secondary | ICD-10-CM

## 2018-06-29 MED ORDER — FUROSEMIDE 20 MG PO TABS: 40.0000 mg | ORAL_TABLET | Freq: Every day | ORAL | 1 refills | Status: DC

## 2018-06-29 MED ORDER — NADOLOL 20 MG PO TABS: 40.0000 mg | ORAL_TABLET | Freq: Every day | ORAL | 1 refills | Status: DC

## 2018-06-29 MED ORDER — SPIRONOLACTONE 50 MG PO TABS: 50.0000 mg | ORAL_TABLET | Freq: Every day | ORAL | 1 refills | Status: DC

## 2018-06-29 MED ORDER — PANTOPRAZOLE SODIUM 40 MG PO TBEC: 40.0000 mg | DELAYED_RELEASE_TABLET | Freq: Every day | ORAL | 1 refills | Status: DC

## 2018-06-29 NOTE — Patient Instructions (Addendum)
Decrease your lasix to 20 mg daily.  Decrease your aldactone to 50 mg daily.  Continue nadolol 40 mg daily.  Continue pantoprazole 40 mg daily.  Continue lactulose daily.  Please call our office if you notice increased swelling.  Make sure to stay on a low sodium diet, less than 2 grams daily.  You will be due for ultrasuond of abdomen in April 2020. We will contact you with an appointment when it gets closer to that time.  You will be due for follow up office visit with Dr Hilarie Fredrickson in April 2020.  If you are age 61 or older, your body mass index should be between 23-30. Your Body mass index is 20.65 kg/m. If this is out of the aforementioned range listed, please consider follow up with your Primary Care Provider.  If you are age 7 or younger, your body mass index should be between 19-25. Your Body mass index is 20.65 kg/m. If this is out of the aformentioned range listed, please consider follow up with your Primary Care Provider.

## 2018-06-29 NOTE — Progress Notes (Signed)
Subjective:    Patient ID: Mercedes Mitchell, female    DOB: 16-Jan-1961, 57 y.o.   MRN: [id number]  HPI Mercedes Mitchell is a 57 year old female with alcohol cirrhosis with portal hypertension, history of alcoholic hepatitis, history of hepatic encephalopathy, small esophageal varices, portal hypertensive gastropathy, history of GERD, history of DVT now off anticoagulation who is here for follow-up.  She is here alone today.  I last saw her on 11/23/2017.  She is doing very well.  She reports that her energy levels are good.  She has no abdominal pain.  No jaundice.  No itching.  No ascites.  She is using lactulose once daily and has had no confusion or encephalopathy.  Bowel movements have been regular.  She did stop lactulose for a few days because she was treated with Augmentin for a respiratory infection and developed diarrhea related to this antibiotic.  Once the diarrhea improved she restarted lactulose.  She has continued nadolol 40 mg daily.  She checks her pulse at home and it has been between 60 and 70 at rest.  She is taking spironolactone 100 mg daily and furosemide 40 mg daily.  She has not been as compliant with watching her sodium intake.  She has continue pantoprazole 40 mg daily.  She admits to drinking a glass of wine when she was on vacation in August and September.  She saw [facility] liver clinic in October.  They check lab work and repeated ultrasound for [facility] screening.  This ultrasound was performed on 06/09/2018.  I reviewed it; it was stable.  No evidence of HCC.  No ascites   Review of Systems As per HPI, otherwise negative  Current Medications, Allergies, Past Medical History, Past Surgical History, Family History and Social History were reviewed in [employer] record.     Objective:   Physical Exam BP 116/70   Pulse 74   Ht 5' 5.5" (1.664 m)   Wt 126 lb (57.2 kg)   BMI 20.65 kg/m  Constitutional: Well-developed and well-nourished. No distress. HEENT:  Normocephalic and atraumatic.  Conjunctivae are normal.  No scleral icterus. Neck: Neck supple. Trachea midline. Cardiovascular: Normal rate, regular rhythm and intact distal pulses. No M/R/G Pulmonary/chest: Effort normal and breath sounds normal. No wheezing, rales or rhonchi. Abdominal: Soft, nontender, nondistended. Bowel sounds active throughout. There are no masses palpable. No hepatosplenomegaly. Extremities: no clubbing, cyanosis, or edema Neurological: Alert and oriented to person place and time.  No asterixis Skin: Skin is warm and dry. Psychiatric: Normal mood and affect. Behavior is normal.        Assessment & Plan:  57 year old female with alcohol cirrhosis with portal hypertension, history of alcoholic hepatitis, history of hepatic encephalopathy, small esophageal varices, portal hypertensive gastropathy, history of GERD, history of DVT now off anticoagulation who is here for follow-up.  1.  Alcohol induced cirrhosis with portal hypertension --clinically she is doing wonderfully.  I am concerned that she has had several glasses of wine the last few months.  We discussed this today and I recommended she stop doing this immediately.  I am concerned that she could slowly begin drinking more more and she certainly does not have the liver function to allow for this.  She voiced understanding.  She does have a trip coming up over the holidays and I strongly recommended she not drink alcohol. --Continue nadolol at 40 mg daily for history of small varices, would not need repeat endoscopy on beta-blocker as long as she  remains compensated --Continue lactulose, once daily sufficient.  No evidence for encephalopathy --HCC screening ultrasound up-to-date, repeat around April 2020 --Lab work performed recently in hepatology clinic --Flu vaccine recommended --I am going to reduce her diuretic dose given the lack of ascites and lower extremity edema.  She should continue to limit sodium intake.   Decrease furosemide to 20 mg and spironolactone 50 mg daily.  I asked that she watch her weight but also for lower extremity edema or increasing abdominal girth.  If this occurs she is asked to notify me immediately and we would need to increase diuretics back to previous doses.  2.  CRC screening --up-to-date, repeat colonoscopy January 2029  71-month follow-up with me, sooner if needed  25 minutes spent with the patient today. Greater than 50% was spent in counseling and coordination of care with the patient

## 2018-07-27 ENCOUNTER — Other Ambulatory Visit: Payer: Self-pay | Admitting: Family Medicine

## 2018-07-27 DIAGNOSIS — G4700 Insomnia, unspecified: Secondary | ICD-10-CM

## 2018-07-31 ENCOUNTER — Telehealth: Payer: Self-pay | Admitting: Family Medicine

## 2018-07-31 NOTE — Telephone Encounter (Signed)
Copied from [place] [id number]. Topic: Quick Communication - Rx Refill/Question >> Jul 31, 2018  1:15 PM Blase Mess A wrote: Medication: QUEtiapine (SEROQUEL) 50 MG tablet [[id number]] Patient was traveling and her lacatose spilled on her medication she is requesting medication to be sent to [facility] and mail order.   Has the patient contacted their pharmacy? Yes  (Agent: If no, request that the patient contact the pharmacy for the refill.) (Agent: If yes, when and what did the pharmacy advise?)  Preferred Pharmacy (with phone number or street name): [facility] [address], [place] [address] Phone: [phone] Fax: [phone]  [facility] [place], [place] [address] [place] [address] Phone: [phone] Fax: [phone]    Agent: Please be advised that RX refills may take up to 3 business days. We ask that you follow-up with your pharmacy.

## 2018-08-01 NOTE — Telephone Encounter (Signed)
Message sent to Dr. Jordan for review and approval. 

## 2018-08-02 ENCOUNTER — Other Ambulatory Visit: Payer: Self-pay | Admitting: Family Medicine

## 2018-08-02 DIAGNOSIS — G4700 Insomnia, unspecified: Secondary | ICD-10-CM

## 2018-08-03 NOTE — Telephone Encounter (Signed)
Pt would like to see if a partial refill can be sent in to  [facility] [address], [place] [phone] (Phone) [phone] (Fax)   As she is running low on her rx and [facility] will take 10 days to get her the medicine.

## 2018-08-04 ENCOUNTER — Other Ambulatory Visit: Payer: Self-pay | Admitting: Family Medicine

## 2018-08-04 DIAGNOSIS — G4700 Insomnia, unspecified: Secondary | ICD-10-CM

## 2018-08-04 MED ORDER — QUETIAPINE FUMARATE 50 MG PO TABS: 50.0000 mg | ORAL_TABLET | Freq: Every day | ORAL | 0 refills | Status: DC

## 2018-08-04 NOTE — Telephone Encounter (Signed)
Patient Rx was sent to [facility]- she wanted it sent to local pharmacy.

## 2018-08-04 NOTE — Telephone Encounter (Signed)
On 08/02/18 Seroquel 68-month supply was sent to her mail order. Please call in Seroquel 50 mg, 30-day supply, to her local pharmacy. Thanks, BJ

## 2018-08-04 NOTE — Telephone Encounter (Signed)
#  30/0 sent to local pharmacy per Dr. [place] in previous phone note request.

## 2018-08-04 NOTE — Telephone Encounter (Signed)
Copied from [place] [id number]. Topic: Quick Communication - Rx Refill/Question >> Aug 04, 2018  3:18 PM Blase Mess A wrote: Medication: QUEtiapine (SEROQUEL) 50 MG tablet [[id number]]  E script was down this week. Requesting if can resend again?   Has the patient contacted their pharmacy? Yes  (Agent: If no, request that the patient contact the pharmacy for the refill.) (Agent: If yes, when and what did the pharmacy advise?)  Preferred Pharmacy (with phone number or street name): [facility] [address] [place] - [address] [phone] (Phone) [phone] (Fax)    Agent: Please be advised that RX refills may take up to 3 business days. We ask that you follow-up with your pharmacy.

## 2018-08-04 NOTE — Telephone Encounter (Signed)
30 day supply sent to local pharmacy per Dr. Martinique.

## 2018-08-08 ENCOUNTER — Other Ambulatory Visit: Payer: Self-pay

## 2018-10-31 ENCOUNTER — Telehealth: Payer: Self-pay | Admitting: *Deleted

## 2018-10-31 DIAGNOSIS — K7030 Alcoholic cirrhosis of liver without ascites: Secondary | ICD-10-CM

## 2018-10-31 NOTE — Telephone Encounter (Signed)
Pt return call and would like to speak with you.

## 2018-10-31 NOTE — Telephone Encounter (Signed)
Patient has been scheduled for ultrasound at [facility] on 11/27/2018 at 9:00 am with 8:45 am arrival. NPO midnight. She is also scheduled for follow up with Dr Hilarie Fredrickson on 12/19/2018 at 3:00 pm. I have left a voicemail for patient to call back.

## 2018-10-31 NOTE — Telephone Encounter (Signed)
I have spoken to patient to advise of follow up date/time with Dr Hilarie Fredrickson as well as date/time/location/prep for ultrasound. She verbalizes understanding of both.

## 2018-10-31 NOTE — Telephone Encounter (Signed)
-----   Message from Larina Bras, [place] sent at 06/29/2018 12:08 PM EST ----- Needs u/s hcc screening for her alcoholic cirrhosis in April 2020. Also needs office follow up in April 2020. See 06/29/18 office note.

## 2018-11-08 ENCOUNTER — Telehealth: Payer: Self-pay | Admitting: Internal Medicine

## 2018-11-08 MED ORDER — PANTOPRAZOLE SODIUM 40 MG PO TBEC: 40.0000 mg | DELAYED_RELEASE_TABLET | Freq: Every day | ORAL | 0 refills | Status: DC

## 2018-11-08 NOTE — Telephone Encounter (Signed)
Pt called and advised that she is out of her mediation she is sched to see doctor on 12/19/2018. PT wants to know if some medication can be called into pharm for her.  [facility]  [place], [place], [place] [address]  Phone: [phone]

## 2018-11-08 NOTE — Telephone Encounter (Signed)
Rx sent 

## 2018-11-16 ENCOUNTER — Telehealth: Payer: Self-pay | Admitting: Internal Medicine

## 2018-11-16 NOTE — Telephone Encounter (Signed)
Pls call Micah at [facility] imaging, she needs to know if [place] that is scheduled for 4/6 can be cancelled.

## 2018-11-16 NOTE — Telephone Encounter (Signed)
Please see note below and advise  

## 2018-11-16 NOTE — Telephone Encounter (Signed)
Okay to delay the ultrasound for [place] screening by 2 months

## 2018-11-16 NOTE — Telephone Encounter (Signed)
Spoke with Mercedes Mitchell and she is aware.

## 2018-11-27 ENCOUNTER — Ambulatory Visit (HOSPITAL_COMMUNITY): Payer: Managed Care, Other (non HMO)

## 2018-12-06 ENCOUNTER — Other Ambulatory Visit: Payer: Self-pay | Admitting: Family Medicine

## 2018-12-06 DIAGNOSIS — G4700 Insomnia, unspecified: Secondary | ICD-10-CM

## 2018-12-06 NOTE — Telephone Encounter (Signed)
Requested medication (s) are due for refill today: Yes  Requested medication (s) are on the active medication list: Yes  Last refill:  08/04/18  Future visit scheduled: No  Notes to clinic:  See request    Requested Prescriptions  Pending Prescriptions Disp Refills   QUEtiapine (SEROQUEL) 50 MG tablet 30 tablet 0    Sig: Take 1 tablet (50 mg total) by mouth at bedtime.     Not Delegated - Psychiatry:  Antipsychotics - Second Generation (Atypical) - quetiapine Failed - 12/06/2018  1:01 PM      Failed - This refill cannot be delegated      Failed - ALT in normal range and within 180 days    ALT  Date Value Ref Range Status  11/23/2017 26 0 - 35 U/L Final         Failed - AST in normal range and within 180 days    AST  Date Value Ref Range Status  11/23/2017 38 (H) 0 - 37 U/L Final         Failed - Completed PHQ-2 or PHQ-9 in the last 360 days.      Failed - Valid encounter within last 6 months    Recent Outpatient Visits          6 months ago Acute non-recurrent pansinusitis   Therapist, [facility] at [facility], NP   8 months ago Pruritic erythematous rash   [facility]rap[facility] [place], Malka So, MD   11 months ago Tick bite of abdomen, initial Education administrator at [facility], Langley Adie, MD   1 year ago Routine general medical examination at a health care facility   [employer] [facility] [place], Malka So, MD   1 year ago Plantar fasciitis   [facility] [place], Malka So, MD             Passed - Last BP in normal range    BP Readings from Last 1 Encounters:  06/29/18 116/70

## 2018-12-07 NOTE — Telephone Encounter (Signed)
Left message to return call to clinic to schedule virtual visit for f/u for med refills.

## 2018-12-08 ENCOUNTER — Other Ambulatory Visit: Payer: Self-pay | Admitting: Internal Medicine

## 2018-12-11 ENCOUNTER — Other Ambulatory Visit: Payer: Self-pay

## 2018-12-11 ENCOUNTER — Encounter: Payer: Self-pay | Admitting: Family Medicine

## 2018-12-11 ENCOUNTER — Ambulatory Visit (INDEPENDENT_AMBULATORY_CARE_PROVIDER_SITE_OTHER): Payer: Self-pay | Admitting: Family Medicine

## 2018-12-11 VITALS — Resp 16

## 2018-12-11 DIAGNOSIS — G4700 Insomnia, unspecified: Secondary | ICD-10-CM

## 2018-12-11 DIAGNOSIS — K7030 Alcoholic cirrhosis of liver without ascites: Secondary | ICD-10-CM

## 2018-12-11 DIAGNOSIS — F320 Major depressive disorder, single episode, mild: Secondary | ICD-10-CM

## 2018-12-11 MED ORDER — QUETIAPINE FUMARATE 50 MG PO TABS: 75.0000 mg | ORAL_TABLET | Freq: Every day | ORAL | 1 refills | Status: DC

## 2018-12-11 NOTE — Assessment & Plan Note (Signed)
Problem has not been well controlled for the past few weeks. We discussed possible side effects of Seroquel. Because LFTs seem to be "fine", Seroquel increased from 50 mg to 75 mg at bedtime. Good sleep hygiene. Follow-up in 3 to 4 months.

## 2018-12-11 NOTE — Progress Notes (Signed)
Virtual Visit via Video Note   I connected with Mercedes Mitchell on 12/11/18 at  9:45 AM EDT by a video enabled telemedicine application and verified that I am speaking with the correct person using two identifiers.  Location patient: home Location provider:home office Persons participating in the virtual visit: patient, provider  I discussed the limitations of evaluation and management by telemedicine and the availability of in person appointments. She expressed understanding and agreed to proceed.   HPI: Mercedes Mitchell is a 58 yo female last seen on 03/21/18. Since her last visit she has follow-up with GI and hepatologist. Today we are following up on chronic medical problems. Insomnia, depression, and anxiety: Currently she is on Seroquel, she has been taking higher doses than prescribed because waking up a few times in the middle of the night.  She wonders if there is a dose between 50 and 100 mg that she can take. She denies depressed mood or suicidal thoughts. Mild anxiety, exacerbated by current COVID-19 pandemic.  She denies unusual fatigue, change in appetite, or weight loss.  Alcoholic cirrhosis, she follows with Dr. Hilarie Fredrickson 06/29/18. She denies alcohol intake. She is on Nadolol 20 mg daily, furosemide 20 mg twice daily, and spironolactone 50 mg daily.  Denies abdominal pain, nausea, vomiting, changes in bowel habits, blood in stool or melena. No more bruising than usual or abdominal distension.  According to pt,she had LFT in 05/2018 and it was "fine."  Negative for headache, visual changes, chest pain, dyspnea, palpitations, or edema.  ROS: See pertinent positives and negatives per HPI. COVID-19 screening questions: Denies new fever,cough,sore throat,or possible exposure to COVID-19. Negative for loss in the sense of smell or taste.   Past Medical History:  Diagnosis Date  . Alcoholic hepatitis with ascites 12/2016   discriminant fx score ~ 38, started 28 days of prednisolone  5/4.  paracentesis 2.6 liters 5/4: no SBP.    Marland Kitchen Alcoholism ([place]) 12/2016  . Candida esophagitis ([place])   . Cirrhosis ([place])   . Diverticulosis   . DVT (deep venous thrombosis) ([place])   . Dysphagia 2011  . Esophageal varices ([place])   . Esophagitis   . External hemorrhoids   . Hepatic steatosis   . Hiatal hernia   . Hyperplastic colonic polyp   . Macrocytic anemia 12/2016  . Malnutrition ([place]) 12/2016  . Panic type anxiety neurosis 2011  . Portal hypertension ([place])   . Portal hypertensive gastropathy [facility])     Past Surgical History:  Procedure Laterality Date  . BREAST BIOPSY Left 04/2017   benign  . ESOPHAGOGASTRODUODENOSCOPY N/A 12/25/2016   Procedure: ESOPHAGOGASTRODUODENOSCOPY (EGD);  Surgeon: Jerene Bears, MD;  Location: [facility];  Service: Endoscopy;  Laterality: N/A;  . IR PARACENTESIS  02/02/2017  . IR TRANSCATHETER BX  02/02/2017  . IR US GUIDE VASC ACCESS RIGHT  02/02/2017  . IR VENOGRAM HEPATIC W HEMODYNAMIC EVALUATION  02/02/2017    Family History  Problem Relation Age of Onset  . Hyperlipidemia Father   . Heart disease Father   . Alcoholism Father   . Heart attack Father   . Alcoholism Mother   . Alcoholism Sister     Social History   Socioeconomic History  . Marital status: Married    Spouse name: Not on file  . Number of children: Not on file  . Years of education: Not on file  . Highest education level: Not on file  Occupational History  . Occupation: Unemployed  Social Needs  .  Financial resource strain: Not on file  . Food insecurity:    Worry: Not on file    Inability: Not on file  . Transportation needs:    Medical: Not on file    Non-medical: Not on file  Tobacco Use  . Smoking status: Former Smoker    Types: Cigarettes  . Smokeless tobacco: Never Used  . Tobacco comment: Reports she quit "about 6 months ago"  Substance and Sexual Activity  . Alcohol use: Yes    Comment: Last drink: 6/18  . Drug use: No    Types: Cocaine, Marijuana     Comment: Last use Cocaine 11/17, reports last use "over a year", [employer] 2/18, reports "a year" ago  . Sexual activity: Not on file  Lifestyle  . Physical activity:    Days per week: Not on file    Minutes per session: Not on file  . Stress: Not on file  Relationships  . Social connections:    Talks on phone: Not on file    Gets together: Not on file    Attends religious service: Not on file    Active member of club or organization: Not on file    Attends meetings of clubs or organizations: Not on file    Relationship status: Not on file  . Intimate partner violence:    Fear of current or ex partner: Not on file    Emotionally abused: Not on file    Physically abused: Not on file    Forced sexual activity: Not on file  Other Topics Concern  . Not on file  Social History Narrative   Married, children, not working      Current Outpatient Medications:  .  furosemide (LASIX) 20 MG tablet, Take 2 tablets (40 mg total) by mouth daily., Disp: 180 tablet, Rfl: 1 .  lactulose (CHRONULAC) 10 GM/15ML solution, Take 30 mLs (20 g total) by mouth 2 (two) times daily. (Patient taking differently: Take 30 g by mouth daily. ), Disp: 1800 mL, Rfl: 1 .  Multiple Vitamin (MULTIVITAMIN WITH MINERALS) TABS tablet, Take 1 tablet by mouth daily., Disp: 30 tablet, Rfl: 0 .  nadolol (CORGARD) 20 MG tablet, Take 2 tablets (40 mg total) by mouth daily., Disp: 180 tablet, Rfl: 1 .  pantoprazole (PROTONIX) 40 MG tablet, Take 1 tablet (40 mg total) by mouth daily., Disp: 90 tablet, Rfl: 0 .  QUEtiapine (SEROQUEL) 50 MG tablet, Take 1.5 tablets (75 mg total) by mouth at bedtime., Disp: 135 tablet, Rfl: 1 .  spironolactone (ALDACTONE) 50 MG tablet, TAKE 1 TABLET DAILY, Disp: 90 tablet, Rfl: 0 .  valACYclovir (VALTREX) 1000 MG tablet, Take 1 tablet (1,000 mg total) by mouth 2 (two) times daily. (Patient taking differently: Take 1,000 mg by mouth 2 (two) times daily as needed. ), Disp: 20 tablet, Rfl:  0  EXAM:  VITALS per patient if applicable:Resp 16   GENERAL: alert, oriented, appears well and in no acute distress  HEENT: atraumatic, conjunctiva clear, no obvious facial abnormalities on inspection.  NECK: normal movements of the head and neck  LUNGS: on inspection no signs of respiratory distress, breathing rate appears normal, no obvious gross SOB, gasping or wheezing  CV: no obvious cyanosis  Mercedes: moves all visible extremities without noticeable abnormality  PSYCH/NEURO: pleasant and cooperative, no obvious depression,+ anxious. Speech and thought processing grossly intact  ASSESSMENT AND PLAN:  Discussed the following assessment and plan:  Insomnia disorder Problem has not been well controlled for the  past few weeks. We discussed possible side effects of Seroquel. Because LFTs seem to be "fine", Seroquel increased from 50 mg to 75 mg at bedtime. Good sleep hygiene. Follow-up in 3 to 4 months.  Depression, major, single episode, mild ([place]) Problem is well controlled. Continue Seroquel, which was increased from 50 to 75 mg at bedtime. Instructed about warning signs.  Alcoholic cirrhosis of liver without ascites ([place]) Last LFT's I can see done in 11/2017.  We discussed some side effects of Seroquel and max recommended dose when liver disease.    I discussed the assessment and treatment plan with the patient. She was provided an opportunity to ask questions and all were answered. The patient agreed with the plan and demonstrated an understanding of the instructions.     Return in about 3 months (around 03/12/2019) for 3-4 CPE and f/u.    Irie Dowson Martinique, MD

## 2018-12-11 NOTE — Assessment & Plan Note (Signed)
Problem is well controlled. Continue Seroquel, which was increased from 50 to 75 mg at bedtime. Instructed about warning signs.

## 2018-12-15 ENCOUNTER — Telehealth: Payer: Self-pay | Admitting: Internal Medicine

## 2018-12-18 ENCOUNTER — Encounter: Payer: Self-pay | Admitting: *Deleted

## 2018-12-18 NOTE — Telephone Encounter (Signed)
I have spoken to patient to advise that Dr Hilarie Fredrickson would like her to go ahead and folllow up with [place] tomorrow. We can hold off on ultrasound until covid restrictions are lifted. Patient okay with this and will zoom with [place] tomorrow.

## 2018-12-19 ENCOUNTER — Encounter: Payer: Self-pay | Admitting: Internal Medicine

## 2018-12-19 ENCOUNTER — Other Ambulatory Visit: Payer: Self-pay

## 2018-12-19 ENCOUNTER — Ambulatory Visit (INDEPENDENT_AMBULATORY_CARE_PROVIDER_SITE_OTHER): Payer: Self-pay | Admitting: Internal Medicine

## 2018-12-19 VITALS — Ht 65.0 in | Wt 125.0 lb

## 2018-12-19 DIAGNOSIS — K766 Portal hypertension: Secondary | ICD-10-CM

## 2018-12-19 DIAGNOSIS — K7030 Alcoholic cirrhosis of liver without ascites: Secondary | ICD-10-CM

## 2018-12-19 DIAGNOSIS — K219 Gastro-esophageal reflux disease without esophagitis: Secondary | ICD-10-CM

## 2018-12-19 MED ORDER — PANTOPRAZOLE SODIUM 40 MG PO TBEC: 40.0000 mg | DELAYED_RELEASE_TABLET | Freq: Every day | ORAL | 3 refills | Status: DC

## 2018-12-19 MED ORDER — NADOLOL 40 MG PO TABS: 40.0000 mg | ORAL_TABLET | Freq: Every day | ORAL | 1 refills | Status: DC

## 2018-12-19 NOTE — Addendum Note (Signed)
Addended by: Larina Bras on: 12/19/2018 05:11 PM   Modules accepted: Orders

## 2018-12-19 NOTE — Progress Notes (Signed)
Subjective:    Patient ID: Mercedes Mitchell, female    DOB: 22-Nov-1960, 58 y.o.   MRN: [id number] This service was provided via telemedicine.  Zoom platform with A/V communication tried but unsuccessful, converted to telephone visit The patient was located outside her home. The provider was located in provider's GI office. The patient did consent to this telephone visit and is aware of possible charges through their insurance for this visit.   The persons participating in this telemedicine service were the patient and I. Time spent on call: 17 minutes   HPI [name] is a 58 year old female with a history of alcoholic cirrhosis with portal hypertension, history of alcoholic hepatitis, alcohol abuse in remission, history of hepatic encephalopathy, small esophageal varices, portal hypertensive gastropathy, GERD and history of DVT now off anticoagulation who seen virtually for follow-up in the setting of COVID-19 pandemic.  She was last seen on 06/29/2018.  She reports she has been doing well.  She has had no issues with her liver.  She is remained alcohol free.  She did remind me of having a glass of champagne at [place] time but none since.  She is continued nadolol 40 mg a day.  Lactulose 30 mL's once a day with normal bowel movements.  No confusion.  No jaundice, abdominal swelling or lower extremity edema.  She is using furosemide 20 mg daily and spironolactone 50 mg daily.  No itching.  No bleeding.  No blood in stool or melena.  She ran out of Protonix about 4 days ago and has had some indigestion but no dysphagia or odynophagia.  She also continues Seroquel under the direction of Dr. Martinique, her primary care doctor.  She has had some anxiousness with the current pandemic.  She did see Dawn Drezek with [facility] liver clinic on 06/22/2018.  Labs were done after this visit but I have not reviewed them.  The patient was told they were normal and her parameters were "good".  Her last ultrasound  was October 2019, reviewed, no HCC.  Review of Systems As per HPI, otherwise negative  Current Medications, Allergies, Past Medical History, Past Surgical History, Family History and Social History were reviewed in [employer] record.     Objective:   Physical Exam No exam, virtual visit  ULTRASOUND ABDOMEN LIMITED RIGHT UPPER QUADRANT   COMPARISON:  CT scan of July 26, 2017.   FINDINGS: Gallbladder:   No gallstones or wall thickening visualized. No sonographic Murphy sign noted by sonographer.   Common bile duct:   Diameter: 2.3 mm which is within normal limits.   Liver:   No focal lesion identified. Heterogeneous echotexture is noted with lobular hepatic contours concerning for cirrhosis. Portal vein is patent on color Doppler imaging with normal direction of blood flow towards the liver.   IMPRESSION: Findings consistent with hepatic cirrhosis. No other abnormality seen in the right upper quadrant of the abdomen.     Electronically Signed   By: Marijo Conception, M.D.   On: 06/09/2018 14:08   CBC    Component Value Date/Time   WBC 7.3 11/23/2017 1558   RBC 4.70 11/23/2017 1558   HGB 14.7 11/23/2017 1558   HCT 42.4 11/23/2017 1558   HCT 23.3 (L) 12/25/2016 0654   PLT 237.0 11/23/2017 1558   MCV 90.2 11/23/2017 1558   MCH 31.2 07/26/2017 0945   MCHC 34.7 11/23/2017 1558   RDW 12.9 11/23/2017 1558   LYMPHSABS 3.0 11/23/2017 1558  MONOABS 0.8 11/23/2017 1558   EOSABS 0.1 11/23/2017 1558   BASOSABS 0.1 11/23/2017 1558   CMP     Component Value Date/Time   NA 138 11/23/2017 1558   NA 130 (A) 05/12/2017   K 4.2 11/23/2017 1558   CL 99 11/23/2017 1558   CO2 30 11/23/2017 1558   GLUCOSE 77 11/23/2017 1558   BUN 12 11/23/2017 1558   BUN 10 05/12/2017   CREATININE 1.00 11/23/2017 1558   CREATININE 0.91 05/12/2017 1637   CALCIUM 9.9 11/23/2017 1558   PROT 8.4 (H) 11/23/2017 1558   ALBUMIN 4.3 11/23/2017 1558   AST 38 (H)  11/23/2017 1558   ALT 26 11/23/2017 1558   ALKPHOS 122 (H) 11/23/2017 1558   BILITOT 1.1 11/23/2017 1558   GFRNONAA >60 07/26/2017 0945   GFRAA >60 07/26/2017 0945   Lab Results  Component Value Date   INR 1.2 (H) 11/23/2017   INR 1.3 (H) 09/07/2017   INR 1.2 (H) 08/29/2017       Assessment & Plan:  58 year old female with a history of alcoholic cirrhosis with portal hypertension, history of alcoholic hepatitis, alcohol abuse in remission, history of hepatic encephalopathy, small esophageal varices, portal hypertensive gastropathy, GERD and history of DVT now off anticoagulation who seen virtually for follow-up in the setting of COVID-19 pandemic.  1.  Alcohol induced cirrhosis with portal hypertension --clinically she continues to do very well.  She has remained alcohol free most recently.  I reminded her the importance of complete alcohol abstinence.  No evidence of decompensated disease today. --History of small varices: Continue nadolol 40 mg daily.  Please consolidate nadolol and provide the 40 mg dose, which she takes at bedtime --History of ascites and lower extremity edema --continue furosemide 20 mg and spironolactone 50 mg daily.  Low-sodium diet --Will need HCC screening ultrasound, delayed slightly due to COVID-19.  Please repeat abdominal ultrasound for [place] screening in June 2020 --She will need CBC, CMP and INR in June 2020 --Office visit with me in June or July 2020  2.  GERD/history of portal gastropathy --we will renew pantoprazole 40 mg a day which works well for her --Refill pantoprazole 40 mg daily, 30 minutes before breakfast.  #90 with refills.  Patient prefers to pick these up locally because she is out.

## 2018-12-19 NOTE — Patient Instructions (Addendum)
You will need [place] screening ultrasound (delayed slightly due to COVID-19) in June 2020. You will also need CBC, CMP and INR in June 2020. We will contact you with appointments as soon as these become available.  We have sent the following medications to your pharmacy for you to pick up at your convenience: Pantoprazole 40 mg a day  We have sent the following prescriptions to your mail in pharmacy: Nadolol 40 mg 1 tablet at bedtime (please note this is a change from you 20 mg 2 tablet script)  If you have not heard from your mail in pharmacy within 1 week or if you have not received your medication in the mail, please contact us at [phone] so we may find out why.  Continue furosemide 20 mg daily.  Continue spironolactone 50 mg daily.  Continue to follow a low sodium diet (2 grams or less daily).  Please follow up with Dr Hilarie Fredrickson in office in June or July 2020.

## 2019-01-05 ENCOUNTER — Ambulatory Visit: Payer: Self-pay

## 2019-01-05 NOTE — Telephone Encounter (Signed)
Outgoing call to Patient who complains of  Eye getting real  bluury while outside with dogs.  Patient states that the base of her neck got tight.  Right side of her neck got tight out of the blue.  Eyes got blurry.  Then went away.  This  Occurred at 11:300. PM   1  Remo Lipps B. Gibbon Female, 58 y.o., 20-Dec-1960 MRN:  [id number] Phone:  [phone] Jerilynn Mages) PCP:  Martinique, Betty G, MD Coverage:  None Message from Luciana Axe sent at 01/05/2019 2:08 PM EDT   Patient was outside with the dogs and her eyes got real blurry. It happened for a split second. Just out of the blue. The base of neck got tight and her eyes got blurry. And right side of head at the base went numb. And then her right got blurry. And went then away. Please advise. The patient is laying down now.   Call History    Type Contact  01/05/2019 02:05 PM Phone (Incoming) Luana Shu, Joen Laura (Self)  Phone: [phone] Jerilynn Mages)  User: Luciana Axe  Encounter Report   Patient Encounter Report    Reason for Disposition . [1] Blurred vision AND [2] new or worsening  Answer Assessment - Initial Assessment Questions 1. LOCATION: "Which eye is involved? Where does it hurt?"  (e.g., eyelid, eyeball or area around the eye)     Both eyes 2. ONSET: "When did the pain start?" (e.g., minutes, hours, days)     1:30pm today. 3. TIMING: "Does the pain come and go, or has it been constant since it started?" (e.g., constant, intermittent, fleeting)     Came and went 4. SEVERITY: "How bad is the pain?"    - MILD: doesn't interfere with normal activities    - MODERATE: interferes with normal activities or awakens from sleep    - SEVERE: excruciating pain and patient unable to do normal activities     Not painful 5. VISION: "Is there any trouble seeing clearly?" (Caution: this question is not useful for most children under age 88.)      Seeing clearly now 6. EYE DISCHARGE: "Is there any discharge from the eye(s)?"  If yes, ask: "What color  is it?" (yellow, green, clear tears, etc)    Denies 7. FEVER: "Does your child have a fever?" If so, ask: "What is it?", "How was it measured?" and "When did it start?" denies     8. CAUSE: "What do you think is causing the pain?" "Any chance your child got something in the eye?" (such as food, soap, sunscreen, etc)     9. CONTACT LENSES: "Does your child wear contacts?" (Reason: will need to wear glasses  yes temporarily).    10. CHILD'S APPEARANCE: "How sick is your child acting?" " What is he doing right now?" If asleep, ask: "How was he acting before he went to sleep?"       *  Protocols used: EYE PAIN AND OTHER [facility]

## 2019-01-05 NOTE — Telephone Encounter (Signed)
Message sent to Dr. Jordan for review. 

## 2019-01-08 ENCOUNTER — Telehealth: Payer: Self-pay | Admitting: *Deleted

## 2019-01-08 DIAGNOSIS — K7030 Alcoholic cirrhosis of liver without ascites: Secondary | ICD-10-CM

## 2019-01-08 NOTE — Telephone Encounter (Signed)
Patient has been scheduled for a telephone visit with Dr Hilarie Fredrickson on 02/20/19 at 10:30 am. She will have labs on 6/24 or 6/25 (labs already in [place]). Patient has also been scheduled for RUQ limited abdominal ultrasound for [place] screening on 01/18/19 at 8 am with a 7:40 am arrival at the [place]. She should be NPO after midnight night before the test.

## 2019-01-08 NOTE — Telephone Encounter (Signed)
-----   Message from Larina Bras, [place] sent at 12/19/2018  5:09 PM EDT ----- See 12/19/18 appt instructions... Pt needs labs, hcc u/s screen and appt with pyrtle.

## 2019-01-08 NOTE — Telephone Encounter (Signed)
Patient indicates that she will actually be out of town at [employer] on 01/18/19. I have rescheduled her for 02/05/19 at 9 am with an 8:40 am arrival. She verbalizes understanding.  In addition, she indicates that she stopped taking her Nadolol 40 mg some time ago due to "bad dreams." States that she did start taking it again recently though because she had an episode where her pulse rate jumped to 111 and she got very dizzy. I advised that she should never stop this medication without consulting a physician and that I would speak with Dr Hilarie Fredrickson, however I felt it more likely that her seroquel would be causing her dreams than the nadolol.   Per Dr Hilarie Fredrickson, we can try to decrease nadolol to 20 mg for now, keeping a close eye on pulse rate/bp and he would like patient to call prescribing physician for seroquel to make them aware of her "bad dreams" and see if they have any suggestions regarding seroquel. She verbalizes understanding and is in agreement with the plan.

## 2019-01-09 ENCOUNTER — Encounter: Payer: Self-pay | Admitting: *Deleted

## 2019-01-09 NOTE — Telephone Encounter (Signed)
Patient informed. 

## 2019-01-09 NOTE — Telephone Encounter (Signed)
If all symptoms resolved and no residual problem I do not think further work-up is needed. She needs to let us know if this happen again. Recommend arranging appt with ey care provider.  If symptoms re-occur and do not resolved in seconds as it did this time she needs to go to the ER.  Thanks, BJ

## 2019-01-18 ENCOUNTER — Other Ambulatory Visit: Payer: Self-pay | Admitting: Internal Medicine

## 2019-01-18 ENCOUNTER — Other Ambulatory Visit: Payer: Self-pay

## 2019-02-05 ENCOUNTER — Other Ambulatory Visit: Payer: Self-pay

## 2019-02-14 ENCOUNTER — Other Ambulatory Visit: Payer: Self-pay

## 2019-02-20 ENCOUNTER — Ambulatory Visit: Payer: Managed Care, Other (non HMO) | Admitting: Internal Medicine

## 2019-02-21 ENCOUNTER — Other Ambulatory Visit: Payer: Self-pay

## 2019-03-08 ENCOUNTER — Other Ambulatory Visit: Payer: Self-pay | Admitting: Internal Medicine

## 2019-03-28 ENCOUNTER — Other Ambulatory Visit: Payer: Self-pay

## 2019-04-05 ENCOUNTER — Telehealth: Payer: Self-pay | Admitting: Internal Medicine

## 2019-04-05 MED ORDER — SPIRONOLACTONE 50 MG PO TABS: 50.0000 mg | ORAL_TABLET | Freq: Every day | ORAL | 0 refills | Status: DC

## 2019-04-05 NOTE — Telephone Encounter (Signed)
Patient indicates that her husband passed away unexpectedly on 11/28/2022 and she is unsure when her insurance will lapse. I have given her our condolences. She is in need of office appointment as well as labs, u/s. She states that she will go for her labs and will reschedule her ultrasound. I have scheduled office visit for the first place I can work her in which is 04/25/19 at 4:00 pm. I advised I will send a 30 day supply only of spironolactone until we see her in office.  She verbalizes understanding.

## 2019-04-05 NOTE — Telephone Encounter (Signed)
Pt is requesting rf for spironolactone sent to [facility] on [address], She is also requesting an appt with Dr. Hilarie Fredrickson. I told her that he is fully booked until October, she stated that she recently lost her husband unexpectedly this past Friday and does not know until when she will have insurance coverage so she is asking if Dr. Hilarie Fredrickson could squeeze her in somewhere in his schedule. Pls call her.

## 2019-04-11 ENCOUNTER — Telehealth: Payer: Self-pay | Admitting: Internal Medicine

## 2019-04-11 MED ORDER — FUROSEMIDE 20 MG PO TABS: 40.0000 mg | ORAL_TABLET | Freq: Every day | ORAL | 0 refills | Status: DC

## 2019-04-11 NOTE — Telephone Encounter (Signed)
30 day rx sent to local pharmacy until her appointment on 05/13/19

## 2019-04-11 NOTE — Telephone Encounter (Signed)
Pt requested a refill on furosemide sent to [facility] on [address].

## 2019-04-18 ENCOUNTER — Other Ambulatory Visit: Payer: Self-pay | Admitting: Internal Medicine

## 2019-04-19 ENCOUNTER — Other Ambulatory Visit (INDEPENDENT_AMBULATORY_CARE_PROVIDER_SITE_OTHER): Payer: Managed Care, Other (non HMO)

## 2019-04-19 ENCOUNTER — Ambulatory Visit
Admission: RE | Admit: 2019-04-19 | Discharge: 2019-04-19 | Disposition: A | Discharge status: Home / Self Care | Payer: Managed Care, Other (non HMO) | Source: Ambulatory Visit | Attending: Internal Medicine | Admitting: Internal Medicine

## 2019-04-19 DIAGNOSIS — K7030 Alcoholic cirrhosis of liver without ascites: Secondary | ICD-10-CM

## 2019-04-19 LAB — COMPREHENSIVE METABOLIC PANEL
ALT: 20 U/L (ref 0–35)
AST: 32 U/L (ref 0–37)
Albumin: 4.5 g/dL (ref 3.5–5.2)
Alkaline Phosphatase: 164 U/L — ABNORMAL HIGH (ref 39–117)
BUN: 6 mg/dL (ref 6–23)
CO2: 30 mEq/L (ref 19–32)
Calcium: 9.6 mg/dL (ref 8.4–10.5)
Chloride: 99 mEq/L (ref 96–112)
Creatinine, Ser: 0.82 mg/dL (ref 0.40–1.20)
GFR: 71.87 mL/min (ref >60.00)
Glucose, Bld: 99 mg/dL (ref 70–99)
Potassium: 4.1 mEq/L (ref 3.5–5.1)
Sodium: 138 mEq/L (ref 135–145)
Total Bilirubin: 1.1 mg/dL (ref 0.2–1.2)
Total Protein: 8.4 g/dL — ABNORMAL HIGH (ref 6.0–8.3)

## 2019-04-19 LAB — PROTIME-INR
INR: 1.1 ratio — ABNORMAL HIGH (ref 0.8–1.0)
Prothrombin Time: 12.9 s (ref 9.6–13.1)

## 2019-04-25 ENCOUNTER — Ambulatory Visit: Payer: Self-pay | Admitting: Internal Medicine

## 2019-05-11 ENCOUNTER — Telehealth: Payer: Self-pay | Admitting: Internal Medicine

## 2019-05-11 ENCOUNTER — Other Ambulatory Visit: Payer: Self-pay | Admitting: Internal Medicine

## 2019-05-11 NOTE — Telephone Encounter (Signed)
Rx was already approved earlier today. Patient advised.

## 2019-05-15 ENCOUNTER — Other Ambulatory Visit: Payer: Self-pay

## 2019-05-15 ENCOUNTER — Ambulatory Visit (INDEPENDENT_AMBULATORY_CARE_PROVIDER_SITE_OTHER): Payer: Managed Care, Other (non HMO) | Admitting: Internal Medicine

## 2019-05-15 ENCOUNTER — Encounter: Payer: Self-pay | Admitting: Internal Medicine

## 2019-05-15 VITALS — BP 94/62 | HR 87 | Temp 98.6°F | Ht 65.5 in | Wt 130.5 lb

## 2019-05-15 DIAGNOSIS — Z23 Encounter for immunization: Secondary | ICD-10-CM

## 2019-05-15 DIAGNOSIS — K766 Portal hypertension: Secondary | ICD-10-CM

## 2019-05-15 DIAGNOSIS — I8500 Esophageal varices without bleeding: Secondary | ICD-10-CM

## 2019-05-15 DIAGNOSIS — K3189 Other diseases of stomach and duodenum: Secondary | ICD-10-CM

## 2019-05-15 DIAGNOSIS — K219 Gastro-esophageal reflux disease without esophagitis: Secondary | ICD-10-CM

## 2019-05-15 DIAGNOSIS — K7030 Alcoholic cirrhosis of liver without ascites: Secondary | ICD-10-CM | POA: Diagnosis not present

## 2019-05-15 MED ORDER — FUROSEMIDE 20 MG PO TABS: 40.0000 mg | ORAL_TABLET | Freq: Every day | ORAL | 3 refills | Status: DC

## 2019-05-15 MED ORDER — LACTULOSE 10 GM/15ML PO SOLN: 20.0000 g | Freq: Two times a day (BID) | ORAL | 0 refills | Status: AC

## 2019-05-15 MED ORDER — PANTOPRAZOLE SODIUM 40 MG PO TBEC: 40.0000 mg | DELAYED_RELEASE_TABLET | Freq: Every day | ORAL | 1 refills | Status: DC

## 2019-05-15 MED ORDER — SPIRONOLACTONE 50 MG PO TABS: ORAL_TABLET | ORAL | 3 refills | Status: DC

## 2019-05-15 MED ORDER — NADOLOL 40 MG PO TABS: 20.0000 mg | ORAL_TABLET | Freq: Every day | ORAL | 0 refills | Status: DC

## 2019-05-15 NOTE — Progress Notes (Signed)
Subjective:    Patient ID: Mercedes Mitchell, female    DOB: 1961/07/17, 58 y.o.   MRN: [id number]  HPI Mercedes Mitchell is a 58 year old female with a history of alcoholic cirrhosis with portal hypertension, history of alcoholic hepatitis, history of hepatic encephalopathy, small esophageal varices, portal hypertensive gastropathy, GERD and history of DVT now off of anticoagulation therapy who seen for follow-up.  She was last seen on 12/19/2018 by virtual visit.  She is here in person today and alone.  Unfortunately her husband [name] died on 04/22/2019.  This was after a very short illness which included fever followed by multi organ failure.  He was treated locally but eventually transferred to [facility] where he died.  There was concerned that he may have had a tickborne illness such as [facility] spotted fever.  His COVID test was negative.  His death has been difficult but she feels that she is coping well.  Her 2 adult sons are involved with her life and have helped with the legal and financial implications of [name]'s death.  He did have insurance and so financially she feels that she will be okay.  She reports physically she is feeling well.  Unfortunately she is drinking wine on occasion.  She reports no more than 2 glasses daily and she is not drinking every day.  She is remaining active and enjoying the company of her dog.  They go on walks every day.  She has continued with her current medicines.  She denies confusion, jaundice, itching, lower extremity and abdominal swelling.  No bleeding.  She is taking Lasix 40 mg daily and spironolactone 50 mg daily.  Pantoprazole 40 mg a day.  Nadolol 20 mg a day and lactulose twice daily.  She is using 25 mg of Seroquel at night to help her sleep.  She recently had labs and an ultrasound for [facility] screening, see below   Review of Systems As per HPI, otherwise negative  Current Medications, Allergies, Past Medical History, Past Surgical History, Family History  and Social History were reviewed in [employer] record.     Objective:   Physical Exam BP 94/62   Pulse 87   Temp 98.6 F (37 C)   Ht 5' 5.5" (1.664 m)   Wt 130 lb 8 oz (59.2 kg)   BMI 21.39 kg/m  Gen: awake, alert, NAD HEENT: anicteric, op clear CV: RRR, no mrg Pulm: CTA b/l Abd: soft, NT/ND, +BS throughout Ext: no c/c/e Neuro: nonfocal, no asterixis  CBC    Component Value Date/Time   WBC 7.3 11/23/2017 1558   RBC 4.70 11/23/2017 1558   HGB 14.7 11/23/2017 1558   HCT 42.4 11/23/2017 1558   HCT 23.3 (L) 12/25/2016 0654   PLT 237.0 11/23/2017 1558   MCV 90.2 11/23/2017 1558   MCH 31.2 07/26/2017 0945   MCHC 34.7 11/23/2017 1558   RDW 12.9 11/23/2017 1558   LYMPHSABS 3.0 11/23/2017 1558   MONOABS 0.8 11/23/2017 1558   EOSABS 0.1 11/23/2017 1558   BASOSABS 0.1 11/23/2017 1558   CMP     Component Value Date/Time   NA 138 04/19/2019 0958   NA 130 (A) 05/12/2017   K 4.1 04/19/2019 0958   CL 99 04/19/2019 0958   CO2 30 04/19/2019 0958   GLUCOSE 99 04/19/2019 0958   BUN 6 04/19/2019 0958   BUN 10 05/12/2017   CREATININE 0.82 04/19/2019 0958   CREATININE 0.91 05/12/2017 1637   CALCIUM 9.6 04/19/2019 0958  PROT 8.4 (H) 04/19/2019 0958   ALBUMIN 4.5 04/19/2019 0958   AST 32 04/19/2019 0958   ALT 20 04/19/2019 0958   ALKPHOS 164 (H) 04/19/2019 0958   BILITOT 1.1 04/19/2019 0958   GFRNONAA >60 07/26/2017 0945   GFRAA >60 07/26/2017 0945   Lab Results  Component Value Date   INR 1.1 (H) 04/19/2019   INR 1.2 (H) 11/23/2017   INR 1.3 (H) 09/07/2017   ULTRASOUND ABDOMEN LIMITED RIGHT UPPER QUADRANT   COMPARISON:  Abdominal ultrasound 06/09/2018   FINDINGS: Gallbladder:   No gallstones or wall thickening visualized. No sonographic Murphy sign noted by sonographer.   Common bile duct:   Diameter: 2.8 mm   Liver:   No focal lesion identified. The hepatic parenchyma is heterogeneous with nodular contour consistent with history  of cirrhosis. Portal vein is patent on color Doppler imaging with normal direction of blood flow towards the liver.   Other: None.   IMPRESSION: Hepatic cirrhosis. No focal liver lesion identified sonographically.     Electronically Signed   By: Audie Pinto M.D.   On: 04/19/2019 13:55       Assessment & Plan:  58 year old female with a history of alcoholic cirrhosis with portal hypertension, history of alcoholic hepatitis, history of hepatic encephalopathy, small esophageal varices, portal hypertensive gastropathy, GERD and history of DVT now off of anticoagulation therapy who seen for follow-up.    1.  Alcohol induced cirrhosis with portal hypertension --clinically she is doing very well.  I am concerned that she started drinking wine even in a very moderate level.  We discussed this today and I warned her that any amount of alcohol is unsafe for her.  I encouraged her to avoid alcohol but also have concerned that she is using alcohol in the grieving process with her husband's recent death. --Alcohol use --I encouraged her to avoid all alcohol.  She certainly seems understand and I think she will try to avoid alcohol entirely. --She will continue nadolol 20 mg daily --We will keep her on low-dose diuretics with Lasix 40 mg and spironolactone 50 mg daily.  Her electrolytes and creatinine are good at these doses --HCC screening is up-to-date no lesions by ultrasound, repeat in 6 months --Recent lab work reviewed, INR stable --History of small varices but on beta-blocker thus repeat endoscopy not needed at this time --History of encephalopathy --certainly none now.  We will continue lactulose --Pneumovax 13 today, will eventually need Pneumovax 23 --Influenza vaccine today --Prescription for shingles vaccine series  2.  GERD with history of portal hypertensive gastropathy --continue daily PPI  I like to see her in 3 months rather than 6 given her alcohol use and also the fact  that she is still coping with her husband's recent death  25 minutes spent with the patient today. Greater than 50% was spent in counseling and coordination of care with the patient

## 2019-05-15 NOTE — Addendum Note (Signed)
Addended by: Larina Bras on: 05/15/2019 05:57 PM   Modules accepted: Orders

## 2019-05-15 NOTE — Patient Instructions (Addendum)
We have sent the following prescriptions to your mail in pharmacy: Lactulose Nadolol  If you have not heard from your mail in pharmacy within 1 week or if you have not received your medication in the mail, please contact us at [phone] so we may find out why.  We have sent the following medications to your pharmacy for you to pick up at your convenience: Lasix Aldactone Pantoprazole  You have been given your flu vaccine today.  We have given you a pneumonia (pneumovax) vaccine today. You may experience a small amount of swelling and redness at the injection site. This is normal. Should you experience these symptoms, please apply ice to the injection area for 10-15 minutes every 2-3 hours. However, should these symptoms or any other symptoms related to the injection concern you, please call our office at [phone].  Please contact [facility] at [phone] to schedule a Shingrix vaccine. They will make an appointment for you and then set you up for your 2nd injection as well.  Discontinue ALL alcohol usage.  Please follow up with Dr Hilarie Fredrickson in 3 months.  If you are age 21 or older, your body mass index should be between 23-30. Your Body mass index is 21.39 kg/m. If this is out of the aforementioned range listed, please consider follow up with your Primary Care Provider.  If you are age 62 or younger, your body mass index should be between 19-25. Your Body mass index is 21.39 kg/m. If this is out of the aformentioned range listed, please consider follow up with your Primary Care Provider.

## 2019-05-31 ENCOUNTER — Other Ambulatory Visit: Payer: Self-pay | Admitting: Family Medicine

## 2019-05-31 DIAGNOSIS — G4700 Insomnia, unspecified: Secondary | ICD-10-CM

## 2019-06-04 NOTE — Telephone Encounter (Signed)
Patient requesting a short supply of QUEtiapine (SEROQUEL) 50 MG tablet   please send to retail and a fully supply to mail order. Patient has a follow up appointment with PCP for 06/08/2019

## 2019-06-06 ENCOUNTER — Telehealth: Payer: Self-pay | Admitting: Internal Medicine

## 2019-06-06 NOTE — Telephone Encounter (Signed)
Pt requested to resend all five medications previously prescribed 05/15/19 to [facility].

## 2019-06-08 ENCOUNTER — Encounter: Payer: Self-pay | Admitting: Family Medicine

## 2019-06-08 ENCOUNTER — Other Ambulatory Visit: Payer: Self-pay

## 2019-06-08 ENCOUNTER — Ambulatory Visit (INDEPENDENT_AMBULATORY_CARE_PROVIDER_SITE_OTHER): Payer: Managed Care, Other (non HMO) | Admitting: Family Medicine

## 2019-06-08 VITALS — BP 100/60 | HR 66 | Temp 97.5°F | Resp 16 | Ht 65.5 in | Wt 129.0 lb

## 2019-06-08 DIAGNOSIS — F320 Major depressive disorder, single episode, mild: Secondary | ICD-10-CM | POA: Diagnosis not present

## 2019-06-08 DIAGNOSIS — G4700 Insomnia, unspecified: Secondary | ICD-10-CM

## 2019-06-08 MED ORDER — QUETIAPINE FUMARATE 50 MG PO TABS: ORAL_TABLET | ORAL | 2 refills | Status: DC

## 2019-06-08 NOTE — Patient Instructions (Signed)
A few things to remember from today's visit:   Insomnia, unspecified type - Plan: QUEtiapine (SEROQUEL) 50 MG tablet  Depression, major, single episode, mild (HCC)  No changes today. When possible schedule you pap smear.   Please be sure medication list is accurate. If a new problem present, please set up appointment sooner than planned today.

## 2019-06-08 NOTE — Progress Notes (Signed)
HPI:   [name] is a 58 y.o. female, who is here today for chronic disease management.  Insomnia and depression well controlled with Seroquel 50 mg daily at bedtime. She takes another dose of seroquel 1/2 tab if she wakes up ,so she can go back to sleep.  Her husband died on 2019-04-17, not sure about caused. She is dealing with this well. Started counseling and it has helped.  She has good support from her 2 sons. Negative for suicidal thoughts.  She has not craved alcohol or tobacco.  Denies alcohol consumption.  In general she is doing fine. Sleeping well, about 7 hours. She is tolerating medication well.   Alcoholic cirrhosis with portal HT and small esophageal varices. Since her last visit,she has seen Dr Hilarie Fredrickson. She is on Nandolol 20 mg,Furosemide 20 mg bid,and Spironolactone 50 mg daily.   Review of Systems  Constitutional: Negative for activity change, appetite change, fatigue and fever.  HENT: Negative for mouth sores, nosebleeds and trouble swallowing.   Eyes: Negative for redness and visual disturbance.  Respiratory: Negative for cough, shortness of breath and wheezing.   Cardiovascular: Negative for chest pain, palpitations and leg swelling.  Gastrointestinal: Negative for abdominal pain, nausea and vomiting.       Negative for changes in bowel habits.  Genitourinary: Negative for decreased urine volume and hematuria.  Neurological: Positive for numbness (LE,intermittent,and stable.). Negative for syncope, weakness and headaches.  Psychiatric/Behavioral: Negative for confusion. The patient is nervous/anxious.   Rest see pertinent positives and negatives per HPI.   Current Outpatient Medications on File Prior to Visit  Medication Sig Dispense Refill  . furosemide (LASIX) 20 MG tablet Take 2 tablets (40 mg total) by mouth daily. 60 tablet 3  . lactulose (CHRONULAC) 10 GM/15ML solution Take 30 mLs (20 g total) by mouth 2 (two) times daily. 5400  mL 0  . nadolol (CORGARD) 40 MG tablet Take 0.5 tablets (20 mg total) by mouth at bedtime. 90 tablet 0  . pantoprazole (PROTONIX) 40 MG tablet Take 1 tablet (40 mg total) by mouth daily. 90 tablet 1  . spironolactone (ALDACTONE) 50 MG tablet TAKE ONE TABLET BY MOUTH DAILY 30 tablet 3   No current facility-administered medications on file prior to visit.      Past Medical History:  Diagnosis Date  . 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  paracentesis 2.6 liters 5/4: no SBP.    Marland Kitchen Alcoholism ([place]) 12/2016  . Candida esophagitis ([place])   . Cirrhosis ([place])   . Diverticulosis   . DVT (deep venous thrombosis) ([place])   . Dysphagia 2011  . Esophageal varices ([place])   . Esophagitis   . External hemorrhoids   . Hepatic steatosis   . Hiatal hernia   . Hyperplastic colonic polyp   . Macrocytic anemia 12/2016  . Malnutrition ([place]) 12/2016  . Panic type anxiety neurosis 2011  . Portal hypertension ([place])   . Portal hypertensive gastropathy (HCC)    Allergies  Allergen Reactions  . Adhesive [Tape]     Social History   Socioeconomic History  . Marital status: Married    Spouse name: Not on file  . Number of children: Not on file  . Years of education: Not on file  . Highest education level: Not on file  Occupational History  . Occupation: Unemployed  Social Needs  . Financial resource strain: Not on file  .  Food insecurity    Worry: Not on file    Inability: Not on file  . Transportation needs    Medical: Not on file    Non-medical: Not on file  Tobacco Use  . Smoking status: Former Smoker    Types: Cigarettes  . Smokeless tobacco: Never Used  . Tobacco comment: Reports she quit "about 6 months ago"  Substance and Sexual Activity  . Alcohol use: Yes    Comment: Last drink: 6/18   . Drug use: No    Types: Cocaine, Marijuana    Comment: Last use Cocaine 11/17, reports last use "over a year", [facility] 2/18,  reports "a year" ago  . Sexual activity: Not on file  Lifestyle  . Physical activity    Days per week: Not on file    Minutes per session: Not on file  . Stress: Not on file  Relationships  . Social Herbalist on phone: Not on file    Gets together: Not on file    Attends religious service: Not on file    Active member of club or organization: Not on file    Attends meetings of clubs or organizations: Not on file    Relationship status: Not on file  Other Topics Concern  . Not on file  Social History Narrative   Married, children, not working    Vitals:   06/08/19 1032  BP: 100/60  Pulse: 66  Resp: 16  Temp: (!) 97.5 F (36.4 C)  SpO2: 98%   Body mass index is 21.14 kg/m.    Physical Exam  Nursing note and vitals reviewed. Constitutional: She is oriented to person, place, and time. She appears well-developed and well-nourished. No distress.  HENT:  Head: Normocephalic and atraumatic.  Mouth/Throat: Oropharynx is clear and moist and mucous membranes are normal.  Eyes: Pupils are equal, round, and reactive to light. Conjunctivae are normal.  Cardiovascular: Normal rate and regular rhythm.  No murmur heard. Pulses:      Dorsalis pedis pulses are 2+ on the right side and 2+ on the left side.  Respiratory: Effort normal and breath sounds normal. No respiratory distress.  GI: Soft. She exhibits no mass. There is no hepatomegaly. There is no abdominal tenderness.  Musculoskeletal:        General: No edema.  Lymphadenopathy:    She has no cervical adenopathy.  Neurological: She is alert and oriented to person, place, and time. She has normal strength. No cranial nerve deficit. Gait normal.  Skin: Skin is warm. No rash noted. No erythema.  Psychiatric: She has a normal mood and affect.  Well groomed, good eye contact.    ASSESSMENT AND PLAN:  Ms. Mercedes Mitchell was seen today for medication follow-up.  Diagnoses and all orders for this visit:  Insomnia,  unspecified type Problem is stable and otherwise well controlled. Good sleep hygiene recommended. No changes in Seroquel dose.  -     QUEtiapine (SEROQUEL) 50 MG tablet; TAKE ONE AND ONE-HALF TABLETS AT BEDTIME  Depression, major, single episode, mild ([place]) She is dealing well with her husband's death. She denies feeling the need to drink alcohol. Strongly recommend avoiding any time of alcohol even low amount.  No changes in Seroquel dose, continue counseling. Instructed about warning signs.   Return for She needs to arrange CPE.   -Ms. Dajsha Turk was advised to return sooner than planned today if new concerns arise     Cheray Pardi G. [place], MD  [place]. [place] office.

## 2019-06-10 ENCOUNTER — Encounter: Payer: Self-pay | Admitting: Family Medicine

## 2019-06-13 MED ORDER — FUROSEMIDE 20 MG PO TABS: 40.0000 mg | ORAL_TABLET | Freq: Every day | ORAL | 0 refills | Status: DC

## 2019-06-13 MED ORDER — SPIRONOLACTONE 50 MG PO TABS: ORAL_TABLET | ORAL | 0 refills | Status: DC

## 2019-06-13 NOTE — Telephone Encounter (Signed)
I have spoken to patient who tells me that she got lactulose from [facility] but did not get her nadolol from [facility]. I have asked her to contact [facility] prior to me sending a new script as to not mess up any pending refills that they may be in the process of working on. Patient tells me she has been having some issues with people telling her that it appears she has 2 insurances so this could be a problem with [facility] and she will call them to find out. I advised it does appear [facility] received both of our scripts on 05/15/19. She does assure me that she does still have plenty of nadolol though for now.  In addition, patient indicates that she has pantoprazole but needs spironolactone as she is out. She also needs furosemide. She did not pick up her scripts at [facility] that were sent with refills on 05/15/19. We have agreed that I will call [facility] and have them fill a 30 day supply, I will cancel all additional refills there and instead send 90 day supply to [facility] for furosemide and spironolactone. She verbalizes understanding.  I have contacted [facility] and asked that they fill spironolactone and furosemide x 1 month and have d/c remaining refills. I have also sent new 90 day rx for both spironolactone and furosemide to [facility].

## 2019-07-26 ENCOUNTER — Other Ambulatory Visit: Payer: Self-pay | Admitting: Internal Medicine

## 2019-08-24 ENCOUNTER — Other Ambulatory Visit: Payer: Self-pay | Admitting: Internal Medicine

## 2019-08-29 ENCOUNTER — Other Ambulatory Visit: Payer: Self-pay | Admitting: Family Medicine

## 2019-08-29 DIAGNOSIS — G4700 Insomnia, unspecified: Secondary | ICD-10-CM

## 2019-09-11 ENCOUNTER — Other Ambulatory Visit: Payer: Self-pay | Admitting: Internal Medicine

## 2019-09-25 ENCOUNTER — Telehealth: Payer: Self-pay | Admitting: Internal Medicine

## 2019-09-25 NOTE — Telephone Encounter (Signed)
Patient is calling for a follow up with Dr. Hilarie Fredrickson. She was supposed to follow up in December but states she can never catch the calendar before Dr. Hilarie Fredrickson is completely booked. She is asking what she should do because her medication is going to need refills and she has to see Dr. Hilarie Fredrickson before they get refilled and also Patient states that Dr. Hilarie Fredrickson wanted her to keep paying cobra until she seen him again but she said she is just having to keep paying it and never able to get an appointment.

## 2019-09-25 NOTE — Telephone Encounter (Signed)
Pt scheduled to see Dr. Hilarie Fredrickson 10/16/19@2 :30pm. Pt aware of appt.

## 2019-10-16 ENCOUNTER — Other Ambulatory Visit: Payer: Self-pay

## 2019-10-16 ENCOUNTER — Ambulatory Visit: Payer: Managed Care, Other (non HMO) | Admitting: Internal Medicine

## 2019-10-16 ENCOUNTER — Other Ambulatory Visit (INDEPENDENT_AMBULATORY_CARE_PROVIDER_SITE_OTHER): Payer: Managed Care, Other (non HMO)

## 2019-10-16 ENCOUNTER — Encounter: Payer: Self-pay | Admitting: Internal Medicine

## 2019-10-16 VITALS — BP 110/70 | HR 71 | Temp 98.1°F | Ht 65.5 in | Wt 123.0 lb

## 2019-10-16 DIAGNOSIS — K219 Gastro-esophageal reflux disease without esophagitis: Secondary | ICD-10-CM

## 2019-10-16 DIAGNOSIS — K7030 Alcoholic cirrhosis of liver without ascites: Secondary | ICD-10-CM

## 2019-10-16 DIAGNOSIS — I8500 Esophageal varices without bleeding: Secondary | ICD-10-CM

## 2019-10-16 DIAGNOSIS — K766 Portal hypertension: Secondary | ICD-10-CM

## 2019-10-16 DIAGNOSIS — K3189 Other diseases of stomach and duodenum: Secondary | ICD-10-CM

## 2019-10-16 LAB — COMPREHENSIVE METABOLIC PANEL WITH GFR
ALT: 17 U/L (ref 0–35)
AST: 30 U/L (ref 0–37)
Albumin: 4.8 g/dL (ref 3.5–5.2)
Alkaline Phosphatase: 81 U/L (ref 39–117)
BUN: 6 mg/dL (ref 6–23)
CO2: 29 meq/L (ref 19–32)
Calcium: 10.1 mg/dL (ref 8.4–10.5)
Chloride: 95 meq/L — ABNORMAL LOW (ref 96–112)
Creatinine, Ser: 0.77 mg/dL (ref 0.40–1.20)
GFR: 77.15 mL/min
Glucose, Bld: 102 mg/dL — ABNORMAL HIGH (ref 70–99)
Potassium: 4.4 meq/L (ref 3.5–5.1)
Sodium: 134 meq/L — ABNORMAL LOW (ref 135–145)
Total Bilirubin: 0.9 mg/dL (ref 0.2–1.2)
Total Protein: 8.4 g/dL — ABNORMAL HIGH (ref 6.0–8.3)

## 2019-10-16 LAB — CBC WITH DIFFERENTIAL/PLATELET
Basophils Absolute: 0.1 10*3/uL (ref 0.0–0.1)
Basophils Relative: 0.6 % (ref 0.0–3.0)
Eosinophils Absolute: 0.1 10*3/uL (ref 0.0–0.7)
Eosinophils Relative: 0.9 % (ref 0.0–5.0)
HCT: 41.8 % (ref 36.0–46.0)
Hemoglobin: 14.7 g/dL (ref 12.0–15.0)
Lymphocytes Relative: 36.7 % (ref 12.0–46.0)
Lymphs Abs: 3.7 10*3/uL (ref 0.7–4.0)
MCHC: 35.2 g/dL (ref 30.0–36.0)
MCV: 96.8 fl (ref 78.0–100.0)
Monocytes Absolute: 0.7 10*3/uL (ref 0.1–1.0)
Monocytes Relative: 7.3 % (ref 3.0–12.0)
Neutro Abs: 5.6 10*3/uL (ref 1.4–7.7)
Neutrophils Relative %: 54.5 % (ref 43.0–77.0)
Platelets: 284 10*3/uL (ref 150.0–400.0)
RBC: 4.32 Mil/uL (ref 3.87–5.11)
RDW: 11.8 % (ref 11.5–15.5)
WBC: 10.2 10*3/uL (ref 4.0–10.5)

## 2019-10-16 LAB — PROTIME-INR
INR: 1.1 ratio — ABNORMAL HIGH (ref 0.8–1.0)
Prothrombin Time: 11.9 s (ref 9.6–13.1)

## 2019-10-16 MED ORDER — NADOLOL 40 MG PO TABS: 20.0000 mg | ORAL_TABLET | Freq: Every day | ORAL | 1 refills | Status: DC

## 2019-10-16 MED ORDER — FUROSEMIDE 20 MG PO TABS: 40.0000 mg | ORAL_TABLET | Freq: Every day | ORAL | 1 refills | Status: DC

## 2019-10-16 MED ORDER — LACTULOSE 10 GM/15ML PO SOLN: 20.0000 g | Freq: Every day | ORAL | 2 refills | Status: DC

## 2019-10-16 MED ORDER — SPIRONOLACTONE 50 MG PO TABS: ORAL_TABLET | ORAL | 1 refills | Status: DC

## 2019-10-16 MED ORDER — PANTOPRAZOLE SODIUM 40 MG PO TBEC: 40.0000 mg | DELAYED_RELEASE_TABLET | Freq: Every day | ORAL | 1 refills | Status: DC

## 2019-10-16 NOTE — Patient Instructions (Addendum)
Your provider has requested that you go to the basement level for lab work before leaving today. Press "B" on the elevator. The lab is located at the first door on the left as you exit the elevator.  You have been scheduled for an abdominal ultrasound at [facility] (1st floor of hospital) on 10/22/19 at 8:30 am. Please arrive 15 minutes prior to your appointment for registration. Make certain not to have anything to eat or drink 6 hours prior to your appointment. Should you need to reschedule your appointment, please contact radiology at [phone]. This test typically takes about 30 minutes to perform.  Continue current medications.  If you are age 58 or older, your body mass index should be between 23-30. Your Body mass index is 20.16 kg/m. If this is out of the aforementioned range listed, please consider follow up with your Primary Care Provider.  If you are age 57 or younger, your body mass index should be between 19-25. Your Body mass index is 20.16 kg/m. If this is out of the aformentioned range listed, please consider follow up with your Primary Care Provider.   Due to recent changes in healthcare laws, you may see the results of your imaging and laboratory studies on MyChart before your provider has had a chance to review them.  We understand that in some cases there may be results that are confusing or concerning to you. Not all laboratory results come back in the same time frame and the provider may be waiting for multiple results in order to interpret others.  Please give [place] 48 hours in order for your provider to thoroughly review all the results before contacting the office for clarification of your results.

## 2019-10-16 NOTE — Progress Notes (Signed)
Subjective:    Patient ID: Mercedes Mitchell, female    DOB: Aug 06, 1961, 59 y.o.   MRN: [id number]  HPI Mercedes Mitchell is a 59 year old female with a history of alcoholic cirrhosis complicated by portal hypertension, history of alcoholic hepatitis, history of hepatic encephalopathy, small esophageal varices, portal gastropathy, GERD, history of DVT now off anticoagulation who is here for follow-up.  She was last seen on 05/15/2019.  She is here alone today.  She reports that she is doing fairly well.  She does have her bad days when she misses her husband.  Her 2 sons remain actively involved in her life and communicate with her frequently.  She is being active and walking nearly 4 miles a day.  She enjoys time with her dog Shelton Silvas.  She continues her medications without interruption.  She is using lactulose 10 to 15 mL daily.  Pantoprazole 40 mg daily, Lasix 40 mg daily and Aldactone 50 mg daily.  She does continue nadolol 10 mg daily.  No increasing abdominal girth or lower extremity edema.  No confusion.  No bleeding.  She is drinking alcohol though very rarely on a social occasion out at [employer] with friends.  No more than 1 or 2 drinks at a time and less than 1 day/week.  She does not drink at home.   Review of Systems As per HPI, otherwise negative  Current Medications, Allergies, Past Medical History, Past Surgical History, Family History and Social History were reviewed in [employer] record.     Objective:   Physical Exam BP 110/70 (BP Location: Right Arm, Patient Position: Sitting, Cuff Size: Normal)   Pulse 71   Temp 98.1 F (36.7 C)   Ht 5' 5.5" (1.664 m)   Wt 123 lb (55.8 kg)   SpO2 98%   BMI 20.16 kg/m  Gen: awake, alert, NAD HEENT: anicteric, op clear CV: RRR, no mrg Pulm: CTA b/l Abd: soft, NT/ND, +BS throughout Ext: no c/c/e Neuro: nonfocal, no asterixis      Assessment & Plan:  59 year old female with a history of alcoholic cirrhosis  complicated by portal hypertension, history of alcoholic hepatitis, history of hepatic encephalopathy, small esophageal varices, portal gastropathy, GERD, history of DVT now off anticoagulation who is here for follow-up.  1.  Alcohol induced cirrhosis with history of portal hypertension --clinically she is doing very well.  She is drinking alcohol rarely and she and I discussed how she should not drink alcohol at all.  I encouraged her to avoid buying alcohol to drink in her home as this could certainly be a slippery slope.  She seems understand this well.  She has had a very difficult year with the loss of her husband with whom she was married for 33 years as well as the stress of the pandemic. --Continue nadolol 10 mg daily for variceal prophylaxis; on beta-blocker she will not require surveillance endoscopy --Continue Lasix 40 mg and spironolactone 50 mg daily --PPI given history of portal gastropathy --She is up-to-date with vaccination, COVID-19 vaccination recommended when she is eligible --CBC, CMP and INR today --Ultrasound for [place] screening ordered --109-month follow-up, sooner if needed  2.  GERD/history of portal gastropathy --continue daily PPI  3.  CRC screening --hyperplastic polyp only removed from the left colon, diverticulosis.  Hemorrhoids.  Repeat in 10 years which would be January 2029  30 minutes total spent today including patient facing time, coordination of care, reviewing medical history/procedures/pertinent radiology studies, and documentation of  the encounter.

## 2019-10-19 ENCOUNTER — Other Ambulatory Visit: Payer: Self-pay

## 2019-10-19 DIAGNOSIS — K7030 Alcoholic cirrhosis of liver without ascites: Secondary | ICD-10-CM

## 2019-10-22 ENCOUNTER — Ambulatory Visit (HOSPITAL_COMMUNITY): Admission: RE | Admit: 2019-10-22 | Payer: Managed Care, Other (non HMO) | Source: Ambulatory Visit

## 2019-10-23 ENCOUNTER — Other Ambulatory Visit: Payer: Self-pay

## 2019-10-23 ENCOUNTER — Telehealth: Payer: Self-pay | Admitting: Internal Medicine

## 2019-10-23 DIAGNOSIS — K7030 Alcoholic cirrhosis of liver without ascites: Secondary | ICD-10-CM

## 2019-10-23 NOTE — Telephone Encounter (Signed)
Patient returned your call from this morning, please call patient one more time.

## 2019-10-23 NOTE — Telephone Encounter (Signed)
Pts [place] rescheduled to 11/06/19@9am , pt to arrive in radiology dept at [facility] at 8:45am. Pt to be NPO after midnight. Pt aware of appt. States she missed the appt yesterday due to a surprise visit from her son.

## 2019-11-06 ENCOUNTER — Ambulatory Visit (HOSPITAL_COMMUNITY): Payer: Managed Care, Other (non HMO)

## 2019-11-11 ENCOUNTER — Other Ambulatory Visit: Payer: Self-pay | Admitting: Internal Medicine

## 2019-11-15 ENCOUNTER — Ambulatory Visit: Payer: Managed Care, Other (non HMO) | Attending: Internal Medicine

## 2019-11-15 DIAGNOSIS — Z23 Encounter for immunization: Secondary | ICD-10-CM

## 2019-11-15 NOTE — Progress Notes (Signed)
   Covid-19 Vaccination Clinic  Name:  Mercedes Mitchell    MRN: [id number] DOB: [birth year]  11/15/2019  [name] was observed post Covid-19 immunization for 15 minutes without incident. She was provided with Vaccine Information Sheet and instruction to access the V-Safe system.   [name] was instructed to call 911 with any severe reactions post vaccine: Marland Kitchen Difficulty breathing  . Swelling of face and throat  . A fast heartbeat  . A bad rash all over body  . Dizziness and weakness   Immunizations Administered    Name Date Dose VIS Date Route   Pfizer COVID-19 Vaccine 11/15/2019  1:56 PM 0.3 mL 08/03/2019 Intramuscular   Manufacturer: [place]   Lot: [id number]   [place]: [id number]

## 2019-12-10 ENCOUNTER — Ambulatory Visit: Payer: Managed Care, Other (non HMO) | Attending: Internal Medicine

## 2019-12-10 DIAGNOSIS — Z23 Encounter for immunization: Secondary | ICD-10-CM

## 2019-12-10 NOTE — Progress Notes (Signed)
   Covid-19 Vaccination Clinic  Name:  Mercedes Mitchell    MRN: [id number] DOB: 04/26/1961  12/10/2019  Ms. Mercedes Mitchell was observed post Covid-19 immunization for 15 minutes without incident. She was provided with Vaccine Information Sheet and instruction to access the V-Safe system.   Ms. Mercedes Mitchell was instructed to call 911 with any severe reactions post vaccine: Marland Kitchen Difficulty breathing  . Swelling of face and throat  . A fast heartbeat  . A bad rash all over body  . Dizziness and weakness   Immunizations Administered    Name Date Dose VIS Date Route   Pfizer COVID-19 Vaccine 12/10/2019  1:54 PM 0.3 mL 10/17/2018 Intramuscular   Manufacturer: [place]   Lot: [id number]   [place]: [id number]

## 2019-12-24 ENCOUNTER — Other Ambulatory Visit: Payer: Self-pay | Admitting: *Deleted

## 2019-12-24 MED ORDER — LACTULOSE 10 GM/15ML PO SOLN: 20.0000 g | Freq: Every day | ORAL | 0 refills | Status: DC

## 2020-01-16 ENCOUNTER — Telehealth: Payer: Self-pay

## 2020-01-16 NOTE — Telephone Encounter (Signed)
-----   Message from Algernon Huxley, RN sent at 10/19/2019 10:37 AM EST ----- Regarding: labs Pt needs labs, orders in epic

## 2020-01-16 NOTE — Telephone Encounter (Signed)
Pt will come for labs.

## 2020-02-25 ENCOUNTER — Other Ambulatory Visit: Payer: Self-pay | Admitting: Family Medicine

## 2020-02-25 DIAGNOSIS — G4700 Insomnia, unspecified: Secondary | ICD-10-CM

## 2020-03-23 ENCOUNTER — Other Ambulatory Visit: Payer: Self-pay | Admitting: Internal Medicine

## 2020-07-01 ENCOUNTER — Other Ambulatory Visit: Payer: Self-pay | Admitting: Internal Medicine

## 2020-07-04 ENCOUNTER — Telehealth: Payer: Self-pay | Admitting: Internal Medicine

## 2020-07-04 MED ORDER — PANTOPRAZOLE SODIUM 40 MG PO TBEC: 40.0000 mg | DELAYED_RELEASE_TABLET | Freq: Every day | ORAL | 0 refills | Status: DC

## 2020-07-04 NOTE — Telephone Encounter (Signed)
Patient has an appointment on 09/05/20 at 910 am. Maybe she misunderstood. I have sent a refill of pantoprazole to her pharmacy.

## 2020-07-28 ENCOUNTER — Other Ambulatory Visit: Payer: Self-pay | Admitting: Internal Medicine

## 2020-08-12 ENCOUNTER — Other Ambulatory Visit: Payer: Self-pay | Admitting: Internal Medicine

## 2020-08-21 ENCOUNTER — Other Ambulatory Visit: Payer: Self-pay | Admitting: Internal Medicine

## 2020-08-29 ENCOUNTER — Other Ambulatory Visit: Payer: Self-pay | Admitting: Internal Medicine

## 2020-09-05 ENCOUNTER — Ambulatory Visit: Payer: Managed Care, Other (non HMO) | Admitting: Internal Medicine

## 2020-09-05 ENCOUNTER — Encounter: Payer: Self-pay | Admitting: Internal Medicine

## 2020-09-05 ENCOUNTER — Other Ambulatory Visit (INDEPENDENT_AMBULATORY_CARE_PROVIDER_SITE_OTHER): Payer: Managed Care, Other (non HMO)

## 2020-09-05 VITALS — BP 128/68 | HR 67 | Ht 65.5 in | Wt 116.0 lb

## 2020-09-05 DIAGNOSIS — I8500 Esophageal varices without bleeding: Secondary | ICD-10-CM | POA: Diagnosis not present

## 2020-09-05 DIAGNOSIS — K7030 Alcoholic cirrhosis of liver without ascites: Secondary | ICD-10-CM

## 2020-09-05 DIAGNOSIS — K766 Portal hypertension: Secondary | ICD-10-CM

## 2020-09-05 DIAGNOSIS — K219 Gastro-esophageal reflux disease without esophagitis: Secondary | ICD-10-CM

## 2020-09-05 LAB — CBC WITH DIFFERENTIAL/PLATELET
Basophils Absolute: 0.1 10*3/uL (ref 0.0–0.1)
Basophils Relative: 1.1 % (ref 0.0–3.0)
Eosinophils Absolute: 0.1 10*3/uL (ref 0.0–0.7)
Eosinophils Relative: 1.7 % (ref 0.0–5.0)
HCT: 41.6 % (ref 36.0–46.0)
Hemoglobin: 14.6 g/dL (ref 12.0–15.0)
Lymphocytes Relative: 42.6 % (ref 12.0–46.0)
Lymphs Abs: 2.6 10*3/uL (ref 0.7–4.0)
MCHC: 35.1 g/dL (ref 30.0–36.0)
MCV: 96.8 fl (ref 78.0–100.0)
Monocytes Absolute: 0.7 10*3/uL (ref 0.1–1.0)
Monocytes Relative: 11.1 % (ref 3.0–12.0)
Neutro Abs: 2.6 10*3/uL (ref 1.4–7.7)
Neutrophils Relative %: 43.5 % (ref 43.0–77.0)
Platelets: 312 10*3/uL (ref 150.0–400.0)
RBC: 4.29 Mil/uL (ref 3.87–5.11)
RDW: 11.5 % (ref 11.5–15.5)
WBC: 6 10*3/uL (ref 4.0–10.5)

## 2020-09-05 LAB — COMPREHENSIVE METABOLIC PANEL
ALT: 12 U/L (ref 0–35)
AST: 24 U/L (ref 0–37)
Albumin: 4.8 g/dL (ref 3.5–5.2)
Alkaline Phosphatase: 51 U/L (ref 39–117)
BUN: 6 mg/dL (ref 6–23)
CO2: 30 mEq/L (ref 19–32)
Calcium: 9.9 mg/dL (ref 8.4–10.5)
Chloride: 97 mEq/L (ref 96–112)
Creatinine, Ser: 0.85 mg/dL (ref 0.40–1.20)
GFR: 75.57 mL/min (ref >60.00)
Glucose, Bld: 87 mg/dL (ref 70–99)
Potassium: 4.7 mEq/L (ref 3.5–5.1)
Sodium: 136 mEq/L (ref 135–145)
Total Bilirubin: 0.7 mg/dL (ref 0.2–1.2)
Total Protein: 8 g/dL (ref 6.0–8.3)

## 2020-09-05 LAB — PROTIME-INR
INR: 1.1 ratio — ABNORMAL HIGH (ref 0.8–1.0)
Prothrombin Time: 11.8 s (ref 9.6–13.1)

## 2020-09-05 NOTE — Progress Notes (Signed)
   Subjective:    Patient ID: Mercedes Mitchell, female    DOB: 06-16-1961, 60 y.o.   MRN: [id number]  HPI Mercedes Mitchell is a 60 year old female with a history of alcohol induced cirrhosis complicated by portal hypertension, prior alcoholic hepatitis, history of hepatic encephalopathy, small esophageal varices, portal gastropathy, GERD, history of DVT now off anticoagulation is here for follow-up.  She is here alone today and was last seen on 10/16/2019.  She reports she has been doing quite well.  She continues to work on closing her husband's estate.  Her husband Mercedes Mitchell died in March 29, 2019.  She has talked to her 2 sons about living alone and recently she was able to take off her wedding rings.  She has cleaned out some of Mercedes Mitchell's things from her home.  [place]  Medically she reports she feels well.  She has no abdominal pain.  No swelling in the abdomen or legs.  No change in bowel habit, blood in stool or melena.  No jaundice.  She drinks alcohol socially but less than 2 days/week.  When she drinks it is only 1 drink per day.  She has been consistent with her furosemide 40 mg daily, spironolactone 50 mg daily, pantoprazole 40 mg daily and nadolol 20 mg daily.  She does use lactulose on occasion.  She is very active with her daughter Mercedes Mitchell and walks 5 miles per day.  Review of Systems As per HPI, otherwise negative  Current Medications, Allergies, Past Medical History, Past Surgical History, Family History and Social History were reviewed in [employer] record.     Objective:   Physical Exam Blood pressure 128/68, pulse 67, height 5' 5.5" (1.664 m), weight 116 lb (52.6 kg). Gen: awake, alert, NAD HEENT: anicteric, op clear CV: RRR, no mrg Pulm: CTA b/l Abd: soft, NT/ND, +BS throughout Ext: no c/c/e Neuro: nonfocal  Labs today      Assessment & Plan:  60 year old female with a history of  alcohol induced cirrhosis complicated by portal hypertension, prior alcoholic hepatitis, history of hepatic encephalopathy, small esophageal varices, portal gastropathy, GERD, history of DVT now off anticoagulation is here for follow-up.    1.  Alcohol induced cirrhosis with portal hypertension --clinically she is doing very well.  She is drinking alcohol 1 to 2 days/week which I cautioned her on.  Fortunately there is no evidence of decompensated disease at this time.  She seems to be doing clinically well -- Continue nadolol 20 mg daily; this is for prophylaxis of variceal hemorrhage.  Repeat EGD would not be necessary for screening or surveillance unless there is decompensation -- Continue Lasix 40 mg and spironolactone 50 mg daily -- No evidence for hepatic encephalopathy, can use lactulose if needed -- Ultrasound recommended at this time for [facility] screening -- She is up-to-date with vaccination -- Annual follow-up, sooner if needed -- CBC, CMP, INR and AFP today  2.  GERD with history of portal gastropathy --continue once daily pantoprazole 40 mg  3.  Colon cancer screening --due in January 2029

## 2020-09-05 NOTE — Patient Instructions (Addendum)
Your provider has requested that you go to the basement level for lab work before leaving today. Press "B" on the elevator. The lab is located at the first door on the left as you exit the elevator.  You have been scheduled for an abdominal ultrasound at [facility] (1st floor of hospital) on Friday 09/12/20 at 9:00 am. Please arrive 15 minutes prior to your appointment for registration. Make certain not to have anything to eat or drink 6 hours prior to your appointment. Should you need to reschedule your appointment, please contact radiology at [phone]. This test typically takes about 30 minutes to perform.  Continue current medications.  Follow up with Dr Hilarie Fredrickson in 1 year.  If you are age 21 or older, your body mass index should be between 23-30. Your Body mass index is 19.01 kg/m. If this is out of the aforementioned range listed, please consider follow up with your Primary Care Provider.  If you are age 54 or younger, your body mass index should be between 19-25. Your Body mass index is 19.01 kg/m. If this is out of the aformentioned range listed, please consider follow up with your Primary Care Provider.   Due to recent changes in healthcare laws, you may see the results of your imaging and laboratory studies on MyChart before your provider has had a chance to review them.  We understand that in some cases there may be results that are confusing or concerning to you. Not all laboratory results come back in the same time frame and the provider may be waiting for multiple results in order to interpret others.  Please give [place] 48 hours in order for your provider to thoroughly review all the results before contacting the office for clarification of your results.

## 2020-09-08 LAB — AFP TUMOR MARKER: AFP-Tumor Marker: 2.1 ng/mL

## 2020-09-12 ENCOUNTER — Ambulatory Visit (HOSPITAL_COMMUNITY): Payer: Managed Care, Other (non HMO)

## 2020-09-18 ENCOUNTER — Ambulatory Visit (HOSPITAL_COMMUNITY): Payer: Managed Care, Other (non HMO) | Attending: Internal Medicine

## 2020-10-06 ENCOUNTER — Other Ambulatory Visit: Payer: Self-pay | Admitting: Internal Medicine

## 2020-10-07 ENCOUNTER — Telehealth: Payer: Self-pay | Admitting: Internal Medicine

## 2020-10-07 NOTE — Telephone Encounter (Signed)
Patient calling to schedule [place]

## 2020-10-07 NOTE — Telephone Encounter (Signed)
Patient requesting refill on Furosemide.

## 2020-10-07 NOTE — Telephone Encounter (Signed)
Patient advised she needs to reschedule abdominal ultrasound prior to refills. She did not got for ultrasound and is over 1 year overdue. She verbalizes understanding.

## 2020-10-08 NOTE — Telephone Encounter (Signed)
Patient wants ultrasound at [place]. She has been given the number for [facility] and states she will cancel the appointment at [facility].

## 2020-10-08 NOTE — Telephone Encounter (Signed)
Inbound call from patient requesting a call back please.  Wants to know if ultrasound call be scheduled at a different facility.

## 2020-10-08 NOTE — Telephone Encounter (Signed)
Patient has been rescheduled for ultrasound on Wednesday, 10/15/20 at 10am. I have left a message for patient to call back.

## 2020-10-10 MED ORDER — LACTULOSE 10 GM/15ML PO SOLN: ORAL | 0 refills | Status: DC

## 2020-10-10 MED ORDER — FUROSEMIDE 20 MG PO TABS: 40.0000 mg | ORAL_TABLET | Freq: Every day | ORAL | 0 refills | Status: DC

## 2020-10-10 MED ORDER — PANTOPRAZOLE SODIUM 40 MG PO TBEC: 40.0000 mg | DELAYED_RELEASE_TABLET | Freq: Every day | ORAL | 0 refills | Status: DC

## 2020-10-10 NOTE — Addendum Note (Signed)
Addended by: Larina Bras on: 10/10/2020 04:05 PM   Modules accepted: Orders

## 2020-10-10 NOTE — Telephone Encounter (Signed)
I have sent a 30 day supply of patient's medications (pantoprazole, lactulose, furosemide) until her ultrasound 10/24/20. She is advised she must keep this appointment before any additional refills can be given and she verbalizes clear understanding of this.

## 2020-10-10 NOTE — Telephone Encounter (Signed)
Patient calling to follow up on medication request. Call patient to advise.

## 2020-10-15 ENCOUNTER — Ambulatory Visit (HOSPITAL_COMMUNITY): Payer: Managed Care, Other (non HMO)

## 2020-10-24 ENCOUNTER — Ambulatory Visit
Admission: RE | Admit: 2020-10-24 | Discharge: 2020-10-24 | Disposition: A | Discharge status: Home / Self Care | Payer: Managed Care, Other (non HMO) | Source: Ambulatory Visit | Attending: Internal Medicine | Admitting: Internal Medicine

## 2020-10-24 DIAGNOSIS — I8500 Esophageal varices without bleeding: Secondary | ICD-10-CM

## 2020-10-24 DIAGNOSIS — K766 Portal hypertension: Secondary | ICD-10-CM

## 2020-10-24 DIAGNOSIS — K7030 Alcoholic cirrhosis of liver without ascites: Secondary | ICD-10-CM

## 2020-11-12 ENCOUNTER — Telehealth: Payer: Self-pay | Admitting: Internal Medicine

## 2020-11-12 MED ORDER — SPIRONOLACTONE 50 MG PO TABS: ORAL_TABLET | ORAL | 0 refills | Status: DC

## 2020-11-12 MED ORDER — FUROSEMIDE 20 MG PO TABS: 40.0000 mg | ORAL_TABLET | Freq: Every day | ORAL | 0 refills | Status: DC

## 2020-11-12 MED ORDER — LACTULOSE 10 GM/15ML PO SOLN: ORAL | 0 refills | Status: DC

## 2020-11-12 MED ORDER — PANTOPRAZOLE SODIUM 40 MG PO TBEC: 40.0000 mg | DELAYED_RELEASE_TABLET | Freq: Every day | ORAL | 0 refills | Status: DC

## 2020-11-12 NOTE — Telephone Encounter (Signed)
Patient requesting a 90 day supply on Pantoprazole, Lactulose, Furosemide and Spironolactone to [facility].

## 2020-11-12 NOTE — Telephone Encounter (Signed)
Rxs sent

## 2021-02-18 ENCOUNTER — Other Ambulatory Visit: Payer: Self-pay | Admitting: Internal Medicine

## 2021-04-30 ENCOUNTER — Other Ambulatory Visit: Payer: Self-pay

## 2021-04-30 DIAGNOSIS — K7030 Alcoholic cirrhosis of liver without ascites: Secondary | ICD-10-CM

## 2021-05-15 ENCOUNTER — Telehealth: Payer: Self-pay | Admitting: Internal Medicine

## 2021-05-15 MED ORDER — NADOLOL 40 MG PO TABS: 20.0000 mg | ORAL_TABLET | Freq: Every day | ORAL | 0 refills | Status: DC

## 2021-05-15 NOTE — Telephone Encounter (Signed)
Rx sent 

## 2021-05-15 NOTE — Telephone Encounter (Signed)
Patient requesting we send a new script to fill the Nadolol medication to [facility]

## 2021-05-25 ENCOUNTER — Other Ambulatory Visit: Payer: Self-pay | Admitting: Internal Medicine

## 2021-08-10 ENCOUNTER — Telehealth: Payer: Self-pay | Admitting: Internal Medicine

## 2021-08-10 NOTE — Telephone Encounter (Signed)
Inbound call from patient requesting to speak with someone in regards to refilling her lactulose medication please.

## 2021-08-10 NOTE — Telephone Encounter (Signed)
Patient needs office visit for refills as indicated in last request. Thanks

## 2021-08-11 MED ORDER — PANTOPRAZOLE SODIUM 40 MG PO TBEC: 40.0000 mg | DELAYED_RELEASE_TABLET | Freq: Every day | ORAL | 0 refills | Status: DC

## 2021-08-11 MED ORDER — FUROSEMIDE 20 MG PO TABS: 40.0000 mg | ORAL_TABLET | Freq: Every day | ORAL | 0 refills | Status: DC

## 2021-08-11 MED ORDER — SPIRONOLACTONE 50 MG PO TABS: ORAL_TABLET | ORAL | 0 refills | Status: DC

## 2021-08-11 NOTE — Telephone Encounter (Signed)
Patient has scheduled an appointment with Dr Hilarie Fredrickson for 10/06/21 at 3:00 pm. She indicates that she has recently moved to [facility] but will be traveling back to see Dr Hilarie Fredrickson in follow up for her cirrhosis. Patient requests one additional rx for pantoprazole, lasix and spironolactone until she can come for her visit on 10/06/21. Rx sent to [facility] per patient request.

## 2021-08-11 NOTE — Telephone Encounter (Signed)
Spoke with patient and informed her she needs an office visit.  Patient stated she is 3 hours away and that pharmacy gave her a partial refill for her medication and insisted on speaking with CMA.  Please advise.

## 2021-10-06 ENCOUNTER — Encounter: Payer: Self-pay | Admitting: Internal Medicine

## 2021-10-06 ENCOUNTER — Other Ambulatory Visit (INDEPENDENT_AMBULATORY_CARE_PROVIDER_SITE_OTHER): Payer: BLUE CROSS/BLUE SHIELD

## 2021-10-06 ENCOUNTER — Ambulatory Visit: Payer: BLUE CROSS/BLUE SHIELD | Admitting: Internal Medicine

## 2021-10-06 VITALS — BP 100/66 | HR 76 | Ht 65.5 in | Wt 117.0 lb

## 2021-10-06 DIAGNOSIS — K7030 Alcoholic cirrhosis of liver without ascites: Secondary | ICD-10-CM

## 2021-10-06 DIAGNOSIS — K766 Portal hypertension: Secondary | ICD-10-CM | POA: Diagnosis not present

## 2021-10-06 DIAGNOSIS — K219 Gastro-esophageal reflux disease without esophagitis: Secondary | ICD-10-CM

## 2021-10-06 LAB — CBC WITH DIFFERENTIAL/PLATELET
Basophils Absolute: 0.1 10*3/uL (ref 0.0–0.1)
Basophils Relative: 0.7 % (ref 0.0–3.0)
Eosinophils Absolute: 0.1 10*3/uL (ref 0.0–0.7)
Eosinophils Relative: 1.1 % (ref 0.0–5.0)
HCT: 40.6 % (ref 36.0–46.0)
Hemoglobin: 14.2 g/dL (ref 12.0–15.0)
Lymphocytes Relative: 35.4 % (ref 12.0–46.0)
Lymphs Abs: 2.9 10*3/uL (ref 0.7–4.0)
MCHC: 34.9 g/dL (ref 30.0–36.0)
MCV: 97.1 fl (ref 78.0–100.0)
Monocytes Absolute: 0.8 10*3/uL (ref 0.1–1.0)
Monocytes Relative: 9.1 % (ref 3.0–12.0)
Neutro Abs: 4.4 10*3/uL (ref 1.4–7.7)
Neutrophils Relative %: 53.7 % (ref 43.0–77.0)
Platelets: 274 10*3/uL (ref 150.0–400.0)
RBC: 4.18 Mil/uL (ref 3.87–5.11)
RDW: 11.5 % (ref 11.5–15.5)
WBC: 8.3 10*3/uL (ref 4.0–10.5)

## 2021-10-06 LAB — PROTIME-INR
INR: 1.1 ratio — ABNORMAL HIGH (ref 0.8–1.0)
Prothrombin Time: 11.8 s (ref 9.6–13.1)

## 2021-10-06 MED ORDER — OMEPRAZOLE 40 MG PO CPDR: 40.0000 mg | DELAYED_RELEASE_CAPSULE | Freq: Every day | ORAL | 1 refills | Status: DC

## 2021-10-06 NOTE — Patient Instructions (Signed)
Your provider has requested that you go to the basement level for lab work before leaving today. Press "B" on the elevator. The lab is located at the first door on the left as you exit the elevator.  We have sent the following medications to your pharmacy for you to pick up at your convenience: Omeprazole 40 mg once daily (in place of pantoprazole)  If you are age 60 or older, your body mass index should be between 23-30. Your Body mass index is 19.17 kg/m. If this is out of the aforementioned range listed, please consider follow up with your Primary Care Provider.  If you are age 60 or younger, your body mass index should be between 19-25. Your Body mass index is 19.17 kg/m. If this is out of the aformentioned range listed, please consider follow up with your Primary Care Provider.   ________________________________________________________  The [place] [facility] providers would like to encourage you to use [facility] to communicate with providers for non-urgent requests or questions.  Due to long hold times on the telephone, sending your provider a message by [facility] may be a faster and more efficient way to get a response.  Please allow 48 business hours for a response.  Please remember that this is for non-urgent requests.  _______________________________________________________  Due to recent changes in healthcare laws, you may see the results of your imaging and laboratory studies on MyChart before your provider has had a chance to review them.  We understand that in some cases there may be results that are confusing or concerning to you. Not all laboratory results come back in the same time frame and the provider may be waiting for multiple results in order to interpret others.  Please give [place] 48 hours in order for your provider to thoroughly review all the results before contacting the office for clarification of your results.

## 2021-10-06 NOTE — Progress Notes (Signed)
Subjective:    Patient ID: Mercedes Mitchell, female    DOB: 04-Feb-1961, 61 y.o.   MRN: [id number]  HPI Mercedes Mitchell is a 61 year old female with a history of alcohol induced cirrhosis complicated previously by portal hypertension, prior alcoholic hepatitis, history of hepatic encephalopathy, small esophageal varices, portal gastropathy, GERD, remote DVT off anticoagulation who is here for follow-up.  She was last seen in January 2022.  She is here alone today.  She reports she is doing very well.  She has recently moved to [facility].  She is getting ready to close on her house.  She is very happy.  From a health perspective she is feeling well.  She is not having issues with ascites or lower extremity edema.  No confusion.  Bowel movements regular with once daily lactulose.  She is continuing low-dose diuretics and nadolol.  No bleeding, blood in stool or melena.  No jaundice.  No abdominal pain.  No heartburn, nausea or vomiting.  Good appetite.  She is drinking some alcohol typically only 1 maybe 2 drinks on days that she drinks.  She is not drinking daily.  She is not getting drunk.  She is renting a place in [facility] and will hopefully eventually buy a place there assuming she likes it.  She recently adopted a cat and her dog is doing well.  She has 1 son who lives in [place] and her other son is doing well also.  He is getting ready to hike the [place] for 3 months.  Her husband Mercedes Mitchell died in 12-Apr-2019.  Review of Systems As per HPI, otherwise negative  Current Medications, Allergies, Past Medical History, Past Surgical History, Family History and Social History were reviewed in [employer] record.    Objective:   Physical Exam BP 100/66    Pulse 76    Ht 5' 5.5" (1.664 m)    Wt 117 lb (53.1 kg)    BMI 19.17 kg/m  Gen: awake, alert, NAD HEENT: anicteric, op clear CV: RRR, no mrg Pulm: CTA b/l Abd: soft, NT/ND, +BS throughout Ext: no  c/c/e Neuro: nonfocal, no asterixis     Assessment & Plan:  61 year old female with a history of alcohol induced cirrhosis complicated previously by portal hypertension, prior alcoholic hepatitis, history of hepatic encephalopathy, small esophageal varices, portal gastropathy, GERD, remote DVT off anticoagulation who is here for follow-up.  Alcohol-related cirrhosis with portal hypertension --clinically she is doing very well.  Her disease has become compensated and she looks well.  She is having some alcohol but seems to be controlling this, not drinking to excess.  I cautioned her on this and she knows that really there is no safe level of alcohol intake for her. --Continue nadolol 20 mg daily; prophylaxis of variceal hemorrhage.  We will defer EGD given beta-blocker unless disease becomes decompensated --Continue furosemide 40 mg daily and spironolactone 50 mg daily --No evidence for hepatic encephalopathy, lactulose can be used daily as needed --Due for ultrasound soon for [facility] screening; we will try to have this done closer to her home --CBC, CMP, INR and AFP today  2.  GERD with portal gastropathy --pantoprazole not covered by her insurance.  She now has [employer] on the exchange.  We can change to omeprazole 40 mg daily or lansoprazole 30 mg daily  3.  CRC screening --due in January 2029   4. PCP --I encouraged her to establish with a primary care  provider near [facility].  Annual follow-up, sooner if needed

## 2021-10-07 LAB — COMPREHENSIVE METABOLIC PANEL
ALT: 14 U/L (ref 0–35)
AST: 27 U/L (ref 0–37)
Albumin: 4.9 g/dL (ref 3.5–5.2)
Alkaline Phosphatase: 49 U/L (ref 39–117)
BUN: 8 mg/dL (ref 6–23)
CO2: 32 mEq/L (ref 19–32)
Calcium: 9.8 mg/dL (ref 8.4–10.5)
Chloride: 94 mEq/L — ABNORMAL LOW (ref 96–112)
Creatinine, Ser: 0.84 mg/dL (ref 0.40–1.20)
GFR: 76.07 mL/min (ref >60.00)
Glucose, Bld: 98 mg/dL (ref 70–99)
Potassium: 4 mEq/L (ref 3.5–5.1)
Sodium: 136 mEq/L (ref 135–145)
Total Bilirubin: 0.8 mg/dL (ref 0.2–1.2)
Total Protein: 8.9 g/dL — ABNORMAL HIGH (ref 6.0–8.3)

## 2021-10-07 LAB — AFP TUMOR MARKER: AFP-Tumor Marker: 2.4 ng/mL

## 2021-10-09 ENCOUNTER — Telehealth: Payer: Self-pay | Admitting: *Deleted

## 2021-10-09 DIAGNOSIS — K7030 Alcoholic cirrhosis of liver without ascites: Secondary | ICD-10-CM

## 2021-10-09 NOTE — Telephone Encounter (Signed)
Patient has been scheduled at [facility] [place] for ultrasound on Tuesday, 10/13/21 at 10:00 am with 945 am arrival.  Orders have been faxed to fax number [phone]. Scheduled with Skip.  I have left a message for patient to call back.

## 2021-10-09 NOTE — Telephone Encounter (Signed)
-----   Message from Jerene Bears, MD sent at 10/06/2021  5:38 PM EST ----- Mercedes Mitchell Patient needs [facility] screening abdominal ultrasound She no longer lives here but she can do this at: [facility] [place], [place], [place] [address] [phone]  Results will need to be faxed back to me.   Thanks [employer]

## 2021-10-09 NOTE — Telephone Encounter (Signed)
Patient has been advised of upcoming ultrasound scheduled for 10/13/21 at [facility] in [place], [place]. She verbalizes understanding of this information and is in agreement with this plan.

## 2021-10-09 NOTE — Telephone Encounter (Signed)
Patient is returning your call.  

## 2021-11-05 ENCOUNTER — Other Ambulatory Visit: Payer: Self-pay

## 2021-11-05 ENCOUNTER — Other Ambulatory Visit: Payer: Self-pay | Admitting: Internal Medicine

## 2021-11-05 MED ORDER — NADOLOL 40 MG PO TABS: 20.0000 mg | ORAL_TABLET | Freq: Every day | ORAL | 0 refills | Status: DC

## 2021-11-05 NOTE — Progress Notes (Signed)
Refill of [facility]ol sent to [facility] ?

## 2021-11-30 ENCOUNTER — Telehealth: Payer: Self-pay | Admitting: Internal Medicine

## 2021-11-30 ENCOUNTER — Other Ambulatory Visit: Payer: Self-pay | Admitting: Internal Medicine

## 2021-11-30 MED ORDER — PANTOPRAZOLE SODIUM 40 MG PO TBEC: 40.0000 mg | DELAYED_RELEASE_TABLET | Freq: Every day | ORAL | 1 refills | Status: DC

## 2021-11-30 NOTE — Telephone Encounter (Signed)
Inbound call from patient stating that she would like a refill for protonix. Please advise. ?

## 2021-11-30 NOTE — Telephone Encounter (Signed)
Called and spoke to patient. Omeprazole is not covered by her insurance. She has been using [facility] to get pantoprazole and it has been very affordable.  Sent 90 day supply with refill to [facility] in [facility] per patient request. ?

## 2021-12-09 ENCOUNTER — Other Ambulatory Visit: Payer: Self-pay | Admitting: Internal Medicine

## 2022-05-31 ENCOUNTER — Other Ambulatory Visit: Payer: Self-pay | Admitting: Internal Medicine

## 2022-07-12 ENCOUNTER — Other Ambulatory Visit: Payer: Self-pay | Admitting: Internal Medicine

## 2022-08-26 ENCOUNTER — Other Ambulatory Visit: Payer: Self-pay | Admitting: Internal Medicine

## 2022-09-07 ENCOUNTER — Other Ambulatory Visit: Payer: Self-pay | Admitting: Internal Medicine

## 2022-09-13 ENCOUNTER — Other Ambulatory Visit: Payer: Self-pay | Admitting: Internal Medicine

## 2022-10-25 ENCOUNTER — Other Ambulatory Visit: Payer: Self-pay | Admitting: Internal Medicine

## 2022-10-29 ENCOUNTER — Ambulatory Visit (INDEPENDENT_AMBULATORY_CARE_PROVIDER_SITE_OTHER): Payer: Medicaid Other | Admitting: Internal Medicine

## 2022-10-29 ENCOUNTER — Encounter: Payer: Self-pay | Admitting: Internal Medicine

## 2022-10-29 ENCOUNTER — Other Ambulatory Visit (INDEPENDENT_AMBULATORY_CARE_PROVIDER_SITE_OTHER): Payer: Medicaid Other

## 2022-10-29 VITALS — BP 110/70 | HR 72 | Ht 65.0 in | Wt 117.1 lb

## 2022-10-29 DIAGNOSIS — K7030 Alcoholic cirrhosis of liver without ascites: Secondary | ICD-10-CM

## 2022-10-29 DIAGNOSIS — K766 Portal hypertension: Secondary | ICD-10-CM | POA: Diagnosis not present

## 2022-10-29 DIAGNOSIS — K219 Gastro-esophageal reflux disease without esophagitis: Secondary | ICD-10-CM | POA: Diagnosis not present

## 2022-10-29 LAB — CBC WITH DIFFERENTIAL/PLATELET
Basophils Absolute: 0.1 10*3/uL (ref 0.0–0.1)
Basophils Relative: 1.1 % (ref 0.0–3.0)
Eosinophils Absolute: 0.1 10*3/uL (ref 0.0–0.7)
Eosinophils Relative: 1.1 % (ref 0.0–5.0)
HCT: 40.4 % (ref 36.0–46.0)
Hemoglobin: 14.2 g/dL (ref 12.0–15.0)
Lymphocytes Relative: 29.9 % (ref 12.0–46.0)
Lymphs Abs: 2.2 10*3/uL (ref 0.7–4.0)
MCHC: 35.1 g/dL (ref 30.0–36.0)
MCV: 95.9 fl (ref 78.0–100.0)
Monocytes Absolute: 0.6 10*3/uL (ref 0.1–1.0)
Monocytes Relative: 8.4 % (ref 3.0–12.0)
Neutro Abs: 4.5 10*3/uL (ref 1.4–7.7)
Neutrophils Relative %: 59.5 % (ref 43.0–77.0)
Platelets: 294 10*3/uL (ref 150.0–400.0)
RBC: 4.22 Mil/uL (ref 3.87–5.11)
RDW: 11.6 % (ref 11.5–15.5)
WBC: 7.5 10*3/uL (ref 4.0–10.5)

## 2022-10-29 LAB — COMPREHENSIVE METABOLIC PANEL
ALT: 12 U/L (ref 0–35)
AST: 28 U/L (ref 0–37)
Albumin: 4.4 g/dL (ref 3.5–5.2)
Alkaline Phosphatase: 42 U/L (ref 39–117)
BUN: 12 mg/dL (ref 6–23)
CO2: 30 mEq/L (ref 19–32)
Calcium: 9.9 mg/dL (ref 8.4–10.5)
Chloride: 95 mEq/L — ABNORMAL LOW (ref 96–112)
Creatinine, Ser: 0.84 mg/dL (ref 0.40–1.20)
GFR: 75.5 mL/min (ref >60.00)
Glucose, Bld: 96 mg/dL (ref 70–99)
Potassium: 3.7 mEq/L (ref 3.5–5.1)
Sodium: 137 mEq/L (ref 135–145)
Total Bilirubin: 0.7 mg/dL (ref 0.2–1.2)
Total Protein: 7.7 g/dL (ref 6.0–8.3)

## 2022-10-29 LAB — PROTIME-INR
INR: 1 ratio (ref 0.8–1.0)
Prothrombin Time: 11.1 s (ref 9.6–13.1)

## 2022-10-29 NOTE — Patient Instructions (Signed)
_______________________________________________________  If your blood pressure at your visit was 140/90 or greater, please contact your primary care physician to follow up on this.  _______________________________________________________  If you are age 61 or older, your body mass index should be between 23-30. Your Body mass index is 19.49 kg/m. If this is out of the aforementioned range listed, please consider follow up with your Primary Care Provider.  If you are age 36 or younger, your body mass index should be between 19-25. Your Body mass index is 19.49 kg/m. If this is out of the aformentioned range listed, please consider follow up with your Primary Care Provider.   ________________________________________________________  The [place] [facility] providers would like to encourage you to use [facility] to communicate with providers for non-urgent requests or questions.  Due to long hold times on the telephone, sending your provider a message by [facility] may be a faster and more efficient way to get a response.  Please allow 48 business hours for a response.  Please remember that this is for non-urgent requests.  _______________________________________________________  Your provider has requested that you go to the basement level for lab work before leaving today. Press "B" on the elevator. The lab is located at the first door on the left as you exit the elevator.  Continue current medications.

## 2022-10-29 NOTE — Progress Notes (Signed)
   Subjective:    Patient ID: Mercedes Mitchell, female    DOB: Apr 04, 1961, 62 y.o.   MRN: [id number]  HPI Mercedes Mitchell is a 62 year old female with a history of alcohol induced cirrhosis complicated previously by portal hypertension, prior alcoholic hepatitis, history of hepatic encephalopathy, small esophageal varices, portal gastropathy, GERD, remote DVT off anticoagulation who is here for follow-up. She was last seen in Feb 2023. She is here alone today.   [place]  She has no specific complaints today.  She has had some constipation this week while traveling but no confusion.  No jaundice or dark urine.  No itching.  No lower extremity edema.  No trouble with heartburn.  No blood in stool or melena.  She has continued furosemide 40 mg, spironolactone 50 mg daily.  She is taking nadolol 20 mg daily and pantoprazole 40 mg daily.  She takes small doses of lactulose on most days.  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  She is working about 6 hours a week and really likes it and is able to set her own schedule.  She also is working out at [employer].  She is just getting back from [place] where she spent the week with her son.  Her son that lives locally will be getting married next year.  They are planning to get married in [place].  She admits to social alcohol drinking no more than 2 drinks when she drinks and she is not drinking daily.   Review of Systems As per HPI, otherwise negative  Current Medications, Allergies, Past Medical History, Past Surgical History, Family History and Social History were reviewed in [employer] record.    Objective:   Physical Exam BP 110/70 (BP Location: Left Arm, Patient Position: Sitting, Cuff Size: Normal)   Pulse 72   Ht 5\' 5"  (1.651 m)   Wt 117 lb 2 oz (53.1 kg)   BMI 19.49 kg/m  Gen: awake, alert, NAD HEENT: anicteric   CV: RRR, no mrg Pulm: CTA b/l Abd: soft, NT/ND, +BS throughout Ext: no c/c/e Neuro: nonfocal     Assessment & Plan:  62 year old female with a history of alcohol induced cirrhosis complicated previously by portal hypertension, prior alcoholic hepatitis, history of hepatic encephalopathy, small esophageal varices, portal gastropathy, GERD, remote DVT off anticoagulation who is here for follow-up.   Alcohol-related cirrhosis with history of portal hypertension --clinically she looks very well.  Certainly needs to be careful as no alcohol is safe for her.  She understands this. -- Continue nadolol 20 mg daily; EGD deferred while compensated and on beta-blocker -- Continue furosemide 40 mg daily and spironolactone 50 mg daily -- No hepatic encephalopathy can use lactulose as needed -- CBC, CMP INR and AFP today -- Prescription for complete abdominal ultrasound for [place] screening; she will seek this at a local imaging center near her home at [facility]  2.  GERD with history of portal gastropathy --continue pantoprazole 40 mg daily  3.  CRC screening --due January 2029  Annual follow-up

## 2022-11-01 LAB — AFP TUMOR MARKER: AFP-Tumor Marker: 2.6 ng/mL

## 2022-12-03 ENCOUNTER — Other Ambulatory Visit: Payer: Self-pay | Admitting: Internal Medicine

## 2022-12-04 ENCOUNTER — Other Ambulatory Visit: Payer: Self-pay | Admitting: Internal Medicine

## 2022-12-07 ENCOUNTER — Other Ambulatory Visit: Payer: Self-pay | Admitting: Internal Medicine

## 2023-02-04 ENCOUNTER — Other Ambulatory Visit: Payer: Self-pay | Admitting: Internal Medicine

## 2023-03-07 ENCOUNTER — Other Ambulatory Visit: Payer: Self-pay | Admitting: Internal Medicine

## 2023-03-10 ENCOUNTER — Other Ambulatory Visit: Payer: Self-pay | Admitting: Internal Medicine

## 2023-06-07 ENCOUNTER — Other Ambulatory Visit: Payer: Self-pay | Admitting: Internal Medicine

## 2023-08-10 ENCOUNTER — Other Ambulatory Visit: Payer: Self-pay | Admitting: Internal Medicine

## 2023-09-09 ENCOUNTER — Other Ambulatory Visit: Payer: Self-pay | Admitting: Internal Medicine

## 2023-09-16 ENCOUNTER — Other Ambulatory Visit: Payer: Self-pay | Admitting: Internal Medicine

## 2023-09-19 ENCOUNTER — Encounter: Payer: Self-pay | Admitting: Internal Medicine

## 2023-12-08 ENCOUNTER — Other Ambulatory Visit: Payer: Self-pay | Admitting: Internal Medicine

## 2023-12-09 ENCOUNTER — Other Ambulatory Visit: Payer: Self-pay | Admitting: Internal Medicine

## 2023-12-15 ENCOUNTER — Other Ambulatory Visit: Payer: Self-pay | Admitting: Internal Medicine

## 2023-12-28 ENCOUNTER — Encounter: Payer: Self-pay | Admitting: Internal Medicine

## 2023-12-28 ENCOUNTER — Other Ambulatory Visit (INDEPENDENT_AMBULATORY_CARE_PROVIDER_SITE_OTHER)

## 2023-12-28 ENCOUNTER — Ambulatory Visit (INDEPENDENT_AMBULATORY_CARE_PROVIDER_SITE_OTHER): Payer: Medicaid Other | Admitting: Internal Medicine

## 2023-12-28 VITALS — BP 120/78 | HR 74 | Ht 65.0 in | Wt 116.5 lb

## 2023-12-28 DIAGNOSIS — F1090 Alcohol use, unspecified, uncomplicated: Secondary | ICD-10-CM | POA: Diagnosis not present

## 2023-12-28 DIAGNOSIS — K7030 Alcoholic cirrhosis of liver without ascites: Secondary | ICD-10-CM

## 2023-12-28 DIAGNOSIS — Z8719 Personal history of other diseases of the digestive system: Secondary | ICD-10-CM

## 2023-12-28 DIAGNOSIS — K219 Gastro-esophageal reflux disease without esophagitis: Secondary | ICD-10-CM | POA: Diagnosis not present

## 2023-12-28 LAB — COMPREHENSIVE METABOLIC PANEL WITH GFR
ALT: 13 U/L (ref 0–35)
AST: 27 U/L (ref 0–37)
Albumin: 4.9 g/dL (ref 3.5–5.2)
Alkaline Phosphatase: 46 U/L (ref 39–117)
BUN: 12 mg/dL (ref 6–23)
CO2: 31 meq/L (ref 19–32)
Calcium: 10.1 mg/dL (ref 8.4–10.5)
Chloride: 92 meq/L — ABNORMAL LOW (ref 96–112)
Creatinine, Ser: 0.82 mg/dL (ref 0.40–1.20)
GFR: 77.08 mL/min (ref >60.00)
Glucose, Bld: 103 mg/dL — ABNORMAL HIGH (ref 70–99)
Potassium: 3.9 meq/L (ref 3.5–5.1)
Sodium: 134 meq/L — ABNORMAL LOW (ref 135–145)
Total Bilirubin: 1 mg/dL (ref 0.2–1.2)
Total Protein: 8.5 g/dL — ABNORMAL HIGH (ref 6.0–8.3)

## 2023-12-28 LAB — CBC WITH DIFFERENTIAL/PLATELET
Basophils Absolute: 0.1 10*3/uL (ref 0.0–0.1)
Basophils Relative: 0.7 % (ref 0.0–3.0)
Eosinophils Absolute: 0.1 10*3/uL (ref 0.0–0.7)
Eosinophils Relative: 0.8 % (ref 0.0–5.0)
HCT: 42.1 % (ref 36.0–46.0)
Hemoglobin: 14.9 g/dL (ref 12.0–15.0)
Lymphocytes Relative: 28.7 % (ref 12.0–46.0)
Lymphs Abs: 2.9 10*3/uL (ref 0.7–4.0)
MCHC: 35.3 g/dL (ref 30.0–36.0)
MCV: 97.3 fl (ref 78.0–100.0)
Monocytes Absolute: 0.8 10*3/uL (ref 0.1–1.0)
Monocytes Relative: 8.4 % (ref 3.0–12.0)
Neutro Abs: 6.1 10*3/uL (ref 1.4–7.7)
Neutrophils Relative %: 61.4 % (ref 43.0–77.0)
Platelets: 312 10*3/uL (ref 150.0–400.0)
RBC: 4.33 Mil/uL (ref 3.87–5.11)
RDW: 11.9 % (ref 11.5–15.5)
WBC: 10 10*3/uL (ref 4.0–10.5)

## 2023-12-28 LAB — PROTIME-INR
INR: 1 ratio (ref 0.8–1.0)
Prothrombin Time: 11 s (ref 9.6–13.1)

## 2023-12-28 NOTE — Progress Notes (Signed)
   Subjective:    Patient ID: Mercedes Mitchell, female    DOB: [birth year], 63 y.o.   MRN: [id number]  HPI Mercedes Mitchell is a 63 year old female with a history of alcohol  induced cirrhosis complicated previously by portal hypertension, prior alcoholic hepatitis, history of hepatic encephalopathy, small esophageal varices, portal gastropathy, GERD, remote DVT off anticoagulation who is here for follow-up. She was last seen March 2024.  Her medications remain unchanged, and she is not experiencing any swelling or fluid retention. She minimizes alcohol  intake, typically consuming alcohol  socially at events like a gala.  She engages in physical activity, working out at [employer] a couple of times a week and walking her dog [place], averaging about three miles a day. She works two to three days a week and stays active socially.  She has not had an upper endoscopy recently and has not undergone an ultrasound for several years. She expresses fear regarding medical procedures.  Review of Systems As per HPI, otherwise negative  Current Medications, Allergies, Past Medical History, Past Surgical History, Family History and Social History were reviewed in [employer] record.     Objective:   Physical Exam BP 120/78 (BP Location: Left Arm, Patient Position: Sitting, Cuff Size: Normal)   Pulse 74   Ht 5\' 5"  (1.651 m)   Wt 116 lb 8 oz (52.8 kg)   SpO2 98%   BMI 19.39 kg/m  Gen: awake, alert, NAD HEENT: anicteric  CV: RRR, no mrg Pulm: CTA b/l Abd: soft, NT/ND, +BS throughout Ext: no c/c/e Neuro: nonfocal  Labs pending from today     Assessment & Plan:  63 year old female with a history of alcohol  induced cirrhosis complicated previously by portal hypertension, prior alcoholic hepatitis, history of hepatic encephalopathy, small esophageal varices, portal gastropathy, GERD, remote DVT off anticoagulation who is here for follow-up.   Alcohol -related cirrhosis with  history of portal hypertension --clinically she looks very well.  Certainly needs to be careful as no alcohol  is safe for her.  She understands this. -- Continue nadolol  20 mg daily; EGD deferred while compensated and on beta-blocker -- Continue furosemide  40 mg daily and spironolactone  50 mg daily -- No hepatic encephalopathy can use lactulose  as needed -- CBC, CMP INR and AFP today -- She has failed to get the abd imaging as required.  She will be back in town on weekend of Aug 9.  I have recommended CT abd/pelvis with contrast for HCC screening.  Plan for Aug 8.   2.  GERD with history of portal gastropathy --continue pantoprazole  40 mg daily   3.  CRC screening --due January 2029  20 minutes total spent today including patient facing time, coordination of care, reviewing medical history/procedures/pertinent radiology studies, and documentation of the encounter.

## 2023-12-28 NOTE — Patient Instructions (Signed)
 Continue current medications.   Your provider has requested that you go to the basement level for lab work before leaving today. Press "B" on the elevator. The lab is located at the first door on the left as you exit the elevator.  You have been scheduled for a CT scan of the abdomen and pelvis at [facility], 1st floor Radiology. You are scheduled on 03/30/24 at 5:00pm. You should arrive at 2:45 pm.  Please follow the written instructions below on the day of your exam:   1) Do not eat anything after 1:00pm (4 hours prior to your test)     You may take any medications as prescribed with a small amount of water, if necessary. If you take any of the following medications: METFORMIN, GLUCOPHAGE, GLUCOVANCE, AVANDAMET, RIOMET, FORTAMET, ACTOPLUS MET, JANUMET, GLUMETZA or METAGLIP, you MAY be asked to HOLD this medication 48 hours AFTER the exam.   The purpose of you drinking the oral contrast is to aid in the visualization of your intestinal tract. The contrast solution may cause some diarrhea. Depending on your individual set of symptoms, you may also receive an intravenous injection of x-ray contrast/dye. Plan on being at [facility] for 45 minutes or longer, depending on the type of exam you are having performed.   If you have any questions regarding your exam or if you need to reschedule, you may call [facility] at [phone] between the hours of 8:00 am and 5:00 pm, Monday-Friday.   _______________________________________________________  If your blood pressure at your visit was 140/90 or greater, please contact your primary care physician to follow up on this.  _______________________________________________________  If you are age 73 or older, your body mass index should be between 23-30. Your Body mass index is 19.39 kg/m. If this is out of the aforementioned range listed, please consider follow up with your Primary Care Provider.  If you are age 10 or younger, your  body mass index should be between 19-25. Your Body mass index is 19.39 kg/m. If this is out of the aformentioned range listed, please consider follow up with your Primary Care Provider.   ________________________________________________________  The [facility] providers would like to encourage you to use MYCHART to communicate with providers for non-urgent requests or questions.  Due to long hold times on the telephone, sending your provider a message by [facility] may be a faster and more efficient way to get a response.  Please allow 48 business hours for a response.  Please remember that this is for non-urgent requests.  _______________________________________________________

## 2023-12-29 ENCOUNTER — Encounter: Payer: Self-pay | Admitting: Internal Medicine

## 2024-01-05 ENCOUNTER — Other Ambulatory Visit: Payer: Self-pay | Admitting: Internal Medicine

## 2024-03-09 ENCOUNTER — Other Ambulatory Visit: Payer: Self-pay | Admitting: Internal Medicine

## 2024-03-18 ENCOUNTER — Other Ambulatory Visit: Payer: Self-pay | Admitting: Internal Medicine

## 2024-03-30 ENCOUNTER — Ambulatory Visit (HOSPITAL_COMMUNITY): Attending: Internal Medicine

## 2024-06-09 ENCOUNTER — Other Ambulatory Visit: Payer: Self-pay | Admitting: Internal Medicine

## 2024-06-20 ENCOUNTER — Other Ambulatory Visit: Payer: Self-pay | Admitting: Internal Medicine

## 2024-08-14 ENCOUNTER — Other Ambulatory Visit: Payer: Self-pay | Admitting: Internal Medicine

## 2024-09-12 ENCOUNTER — Other Ambulatory Visit: Payer: Self-pay | Admitting: Internal Medicine

## 2024-09-21 ENCOUNTER — Other Ambulatory Visit: Payer: Self-pay | Admitting: Internal Medicine
